# Patient Record
Sex: Male | Born: 1945 | Race: Black or African American | Hispanic: No | Marital: Single | Smoking: Current every day smoker
Health system: Southern US, Community
[De-identification: ages and names within clinical notes are randomized; demographics above are authoritative.]

## PROBLEM LIST (undated history)

## (undated) DIAGNOSIS — N184 Chronic kidney disease, stage 4 (severe): Secondary | ICD-10-CM

## (undated) DIAGNOSIS — E875 Hyperkalemia: Secondary | ICD-10-CM

## (undated) DIAGNOSIS — S42213A Unspecified displaced fracture of surgical neck of unspecified humerus, initial encounter for closed fracture: Secondary | ICD-10-CM

## (undated) DIAGNOSIS — I10 Essential (primary) hypertension: Secondary | ICD-10-CM

## (undated) DIAGNOSIS — N400 Enlarged prostate without lower urinary tract symptoms: Secondary | ICD-10-CM

## (undated) DIAGNOSIS — E1122 Type 2 diabetes mellitus with diabetic chronic kidney disease: Secondary | ICD-10-CM

## (undated) DIAGNOSIS — I209 Angina pectoris, unspecified: Secondary | ICD-10-CM

## (undated) DIAGNOSIS — N189 Chronic kidney disease, unspecified: Secondary | ICD-10-CM

## (undated) DIAGNOSIS — D351 Benign neoplasm of parathyroid gland: Secondary | ICD-10-CM

## (undated) DIAGNOSIS — R4182 Altered mental status, unspecified: Secondary | ICD-10-CM

## (undated) DIAGNOSIS — G9340 Encephalopathy, unspecified: Secondary | ICD-10-CM

## (undated) DIAGNOSIS — F0390 Unspecified dementia without behavioral disturbance: Secondary | ICD-10-CM

## (undated) DIAGNOSIS — T7840XA Allergy, unspecified, initial encounter: Secondary | ICD-10-CM

## (undated) DIAGNOSIS — K859 Acute pancreatitis without necrosis or infection, unspecified: Secondary | ICD-10-CM

## (undated) DIAGNOSIS — E210 Primary hyperparathyroidism: Principal | ICD-10-CM

## (undated) DIAGNOSIS — Z87442 Personal history of urinary calculi: Secondary | ICD-10-CM

## (undated) DIAGNOSIS — E119 Type 2 diabetes mellitus without complications: Secondary | ICD-10-CM

## (undated) DIAGNOSIS — F209 Schizophrenia, unspecified: Secondary | ICD-10-CM

## (undated) DIAGNOSIS — F1020 Alcohol dependence, uncomplicated: Secondary | ICD-10-CM

## (undated) DIAGNOSIS — Z720 Tobacco use: Secondary | ICD-10-CM

## (undated) HISTORY — DX: Benign neoplasm of parathyroid gland: D35.1

## (undated) HISTORY — PX: KIDNEY STONE SURGERY: SHX686

## (undated) HISTORY — DX: Allergy, unspecified, initial encounter: T78.40XA

## (undated) HISTORY — DX: Unspecified dementia, unspecified severity, without behavioral disturbance, psychotic disturbance, mood disturbance, and anxiety: F03.90

---

## 2000-08-15 ENCOUNTER — Ambulatory Visit (HOSPITAL_COMMUNITY): Admission: RE | Admit: 2000-08-15 | Discharge: 2000-08-15 | Payer: Self-pay | Admitting: Family Medicine

## 2000-08-15 ENCOUNTER — Encounter: Payer: Self-pay | Admitting: Family Medicine

## 2005-12-26 ENCOUNTER — Ambulatory Visit: Payer: Self-pay | Admitting: Family Medicine

## 2006-01-09 ENCOUNTER — Ambulatory Visit: Payer: Self-pay | Admitting: Family Medicine

## 2006-01-12 ENCOUNTER — Ambulatory Visit (HOSPITAL_COMMUNITY): Admission: RE | Admit: 2006-01-12 | Discharge: 2006-01-12 | Payer: Self-pay | Admitting: Internal Medicine

## 2006-01-24 ENCOUNTER — Ambulatory Visit: Payer: Self-pay | Admitting: Family Medicine

## 2006-01-27 ENCOUNTER — Ambulatory Visit (HOSPITAL_COMMUNITY): Admission: RE | Admit: 2006-01-27 | Discharge: 2006-01-27 | Payer: Self-pay | Admitting: Internal Medicine

## 2006-02-01 ENCOUNTER — Ambulatory Visit: Payer: Self-pay | Admitting: Family Medicine

## 2006-11-28 DIAGNOSIS — Z87442 Personal history of urinary calculi: Secondary | ICD-10-CM

## 2006-11-28 DIAGNOSIS — S42213A Unspecified displaced fracture of surgical neck of unspecified humerus, initial encounter for closed fracture: Secondary | ICD-10-CM

## 2006-11-28 HISTORY — DX: Unspecified displaced fracture of surgical neck of unspecified humerus, initial encounter for closed fracture: S42.213A

## 2006-11-28 HISTORY — DX: Personal history of urinary calculi: Z87.442

## 2007-04-09 ENCOUNTER — Emergency Department (HOSPITAL_COMMUNITY): Admission: EM | Admit: 2007-04-09 | Discharge: 2007-04-09 | Payer: Self-pay | Admitting: Emergency Medicine

## 2007-08-10 ENCOUNTER — Ambulatory Visit: Payer: Self-pay | Admitting: *Deleted

## 2007-08-10 ENCOUNTER — Inpatient Hospital Stay (HOSPITAL_COMMUNITY): Admission: EM | Admit: 2007-08-10 | Discharge: 2007-08-22 | Payer: Self-pay | Admitting: Emergency Medicine

## 2007-08-15 HISTORY — PX: OTHER SURGICAL HISTORY: SHX169

## 2007-08-16 ENCOUNTER — Encounter (INDEPENDENT_AMBULATORY_CARE_PROVIDER_SITE_OTHER): Payer: Self-pay | Admitting: Urology

## 2007-08-16 ENCOUNTER — Encounter: Payer: Self-pay | Admitting: Urology

## 2007-08-16 HISTORY — PX: OTHER SURGICAL HISTORY: SHX169

## 2007-08-29 ENCOUNTER — Ambulatory Visit: Payer: Self-pay | Admitting: Internal Medicine

## 2007-08-29 LAB — CONVERTED CEMR LAB
Albumin: 3.5 g/dL (ref 3.5–5.2)
Alkaline Phosphatase: 89 units/L (ref 39–117)
BUN: 13 mg/dL (ref 6–23)
Calcium: 11.4 mg/dL — ABNORMAL HIGH (ref 8.4–10.5)
Chloride: 110 meq/L (ref 96–112)
Glucose, Bld: 98 mg/dL (ref 70–99)
Magnesium: 1.6 mg/dL (ref 1.5–2.5)
Potassium: 4.4 meq/L (ref 3.5–5.3)
Sodium: 142 meq/L (ref 135–145)
Total Protein: 7.6 g/dL (ref 6.0–8.3)

## 2007-09-18 ENCOUNTER — Observation Stay (HOSPITAL_COMMUNITY): Admission: RE | Admit: 2007-09-18 | Discharge: 2007-09-19 | Payer: Self-pay | Admitting: Urology

## 2007-09-18 HISTORY — PX: OTHER SURGICAL HISTORY: SHX169

## 2007-10-24 ENCOUNTER — Ambulatory Visit: Payer: Self-pay | Admitting: Internal Medicine

## 2007-10-24 LAB — CONVERTED CEMR LAB
ALT: 8 units/L (ref 0–53)
Alkaline Phosphatase: 108 units/L (ref 39–117)
CO2: 22 meq/L (ref 19–32)
Creatinine, Ser: 1.46 mg/dL (ref 0.40–1.50)
Glucose, Bld: 71 mg/dL (ref 70–99)
Magnesium: 1.9 mg/dL (ref 1.5–2.5)
Phosphorus: 2.7 mg/dL (ref 2.3–4.6)
Sodium: 137 meq/L (ref 135–145)
Total Bilirubin: 0.3 mg/dL (ref 0.3–1.2)
Total Protein: 8.3 g/dL (ref 6.0–8.3)

## 2008-08-11 ENCOUNTER — Inpatient Hospital Stay (HOSPITAL_COMMUNITY): Admission: EM | Admit: 2008-08-11 | Discharge: 2008-08-25 | Payer: Self-pay | Admitting: Emergency Medicine

## 2008-08-11 ENCOUNTER — Ambulatory Visit: Payer: Self-pay | Admitting: Family Medicine

## 2009-07-08 IMAGING — CT CT ABDOMEN WO/W CM
4 of 8 series · 12 of 32 positions shown, 16 images · non-contrast
Comparison: none

HISTORY: Abdominal pain, chest pain, hypercalcemia, nausea, vomiting,
pancreatitis history multiple stones

[Series 2: use smart ma with max @ 160 · axial · 0.70mm/px · z∈[-647,-177]mm · 3 of 95 slices shown, 7 images]
[im 1/95  soft-tissue]
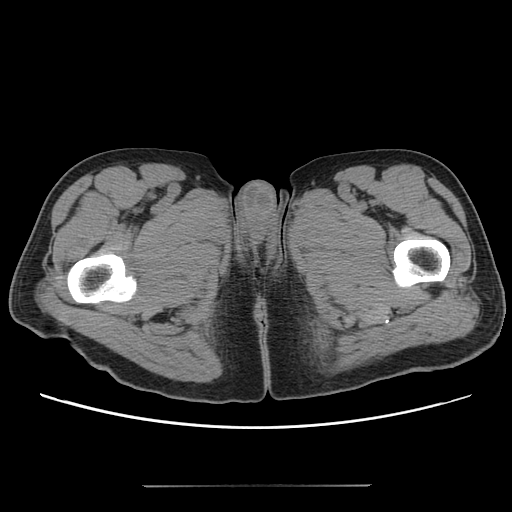
[im 1/95  lung]
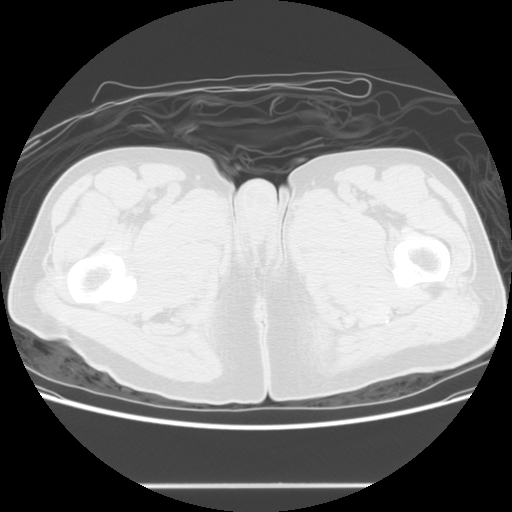
[im 1/95  bone]
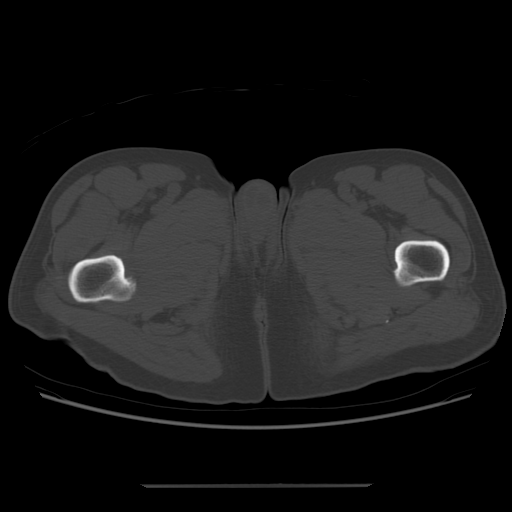
[im 48/95  soft-tissue]
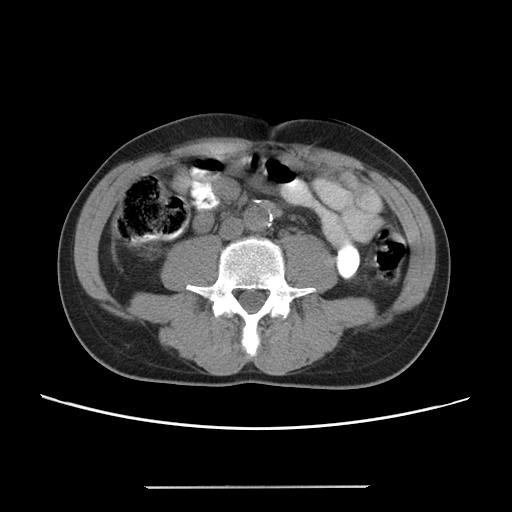
[im 48/95  lung]
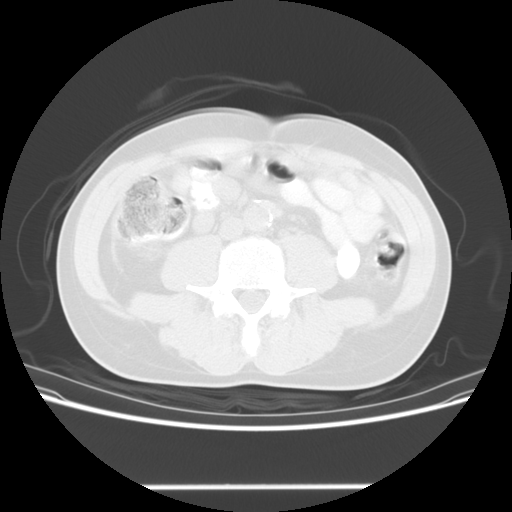
[im 95/95  soft-tissue]
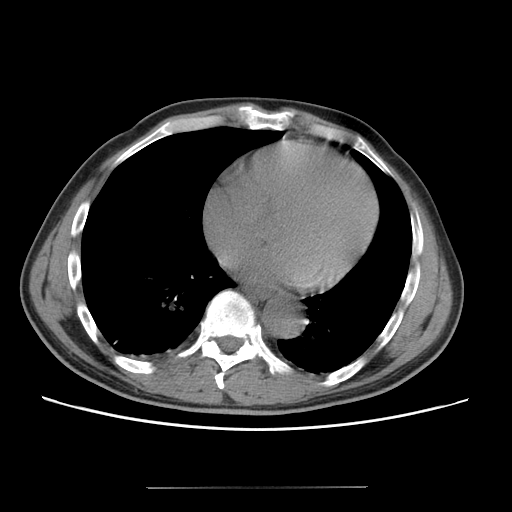
[im 95/95  lung]
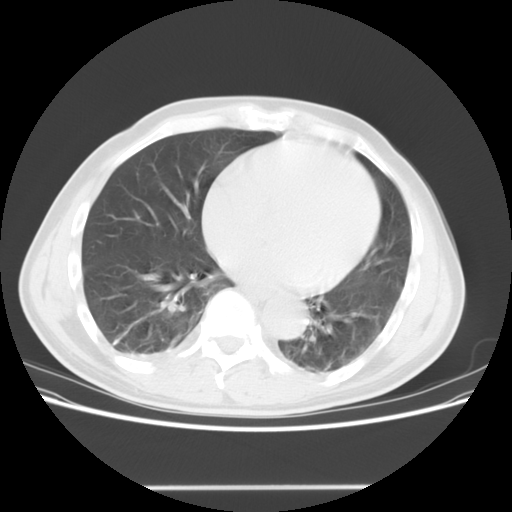

[Series 701: reformatted · sagittal · 0.70mm/px · 4 of 147 slices shown (1 of 3)]
[im 30/147  soft-tissue]
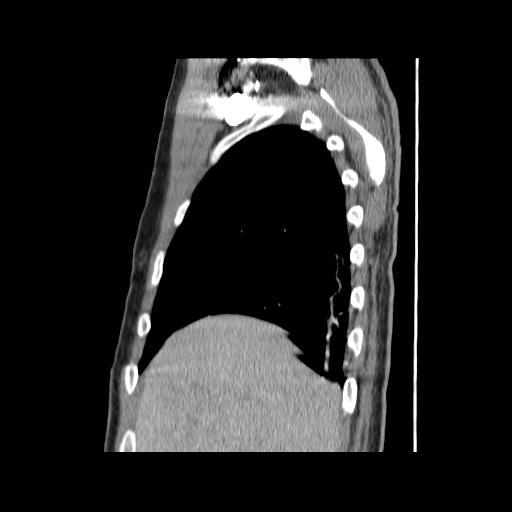
[im 59/147  soft-tissue]
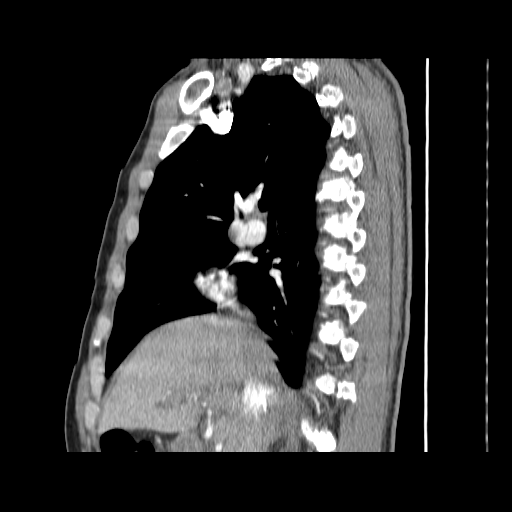
[im 88/147  soft-tissue]
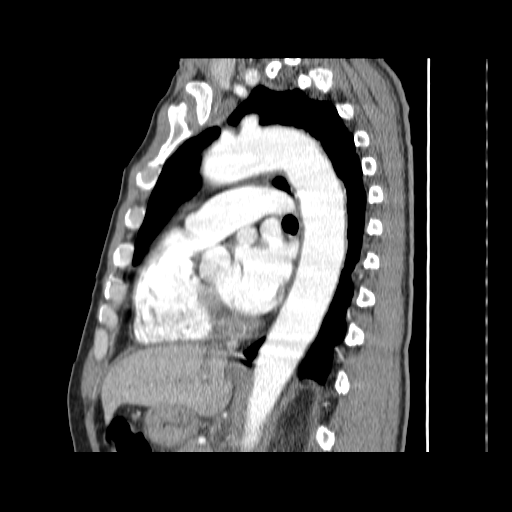
[im 117/147  soft-tissue]
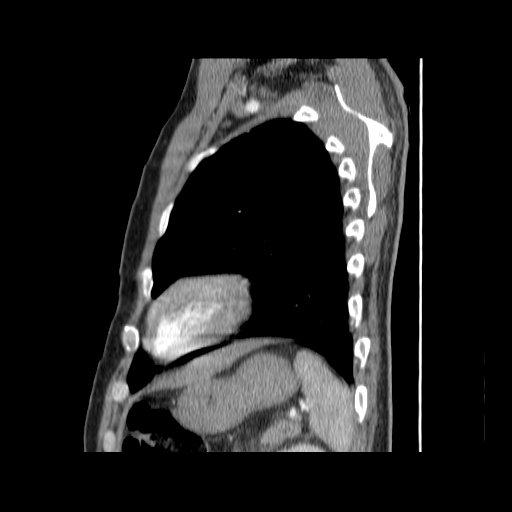

[Series 703: reformatted · sagittal · 0.94mm/px · 3 of 137 slices shown (2 of 3)]
[im 35/137  soft-tissue]
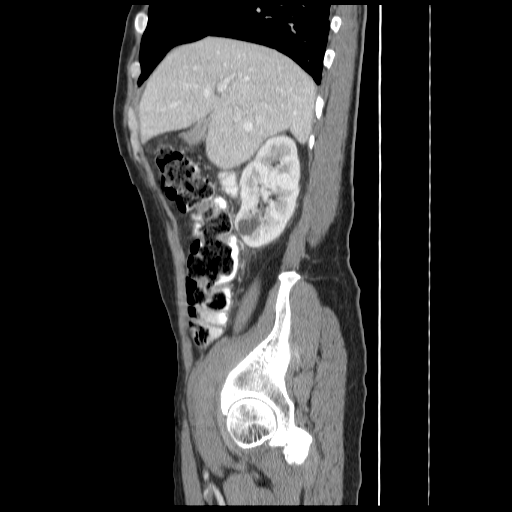
[im 69/137  soft-tissue]
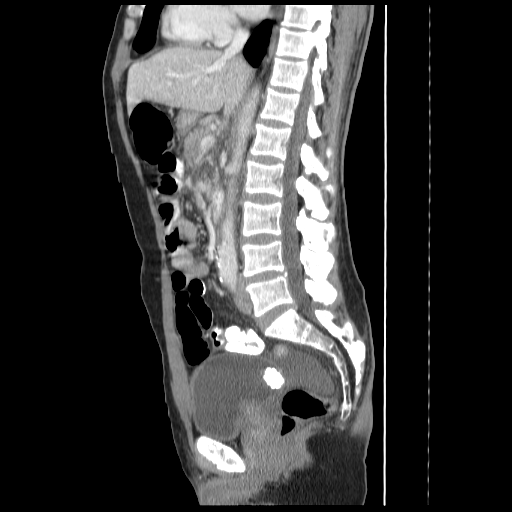
[im 103/137  soft-tissue]
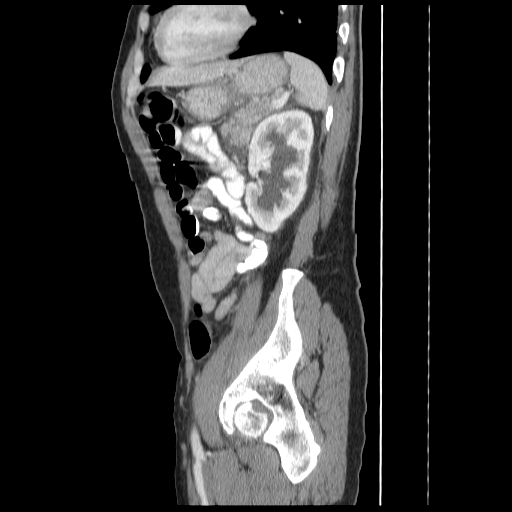

[Series 704: reformatted · coronal · 0.94mm/px · 2 of 111 slices shown (3 of 3)]
[im 37/111  soft-tissue]
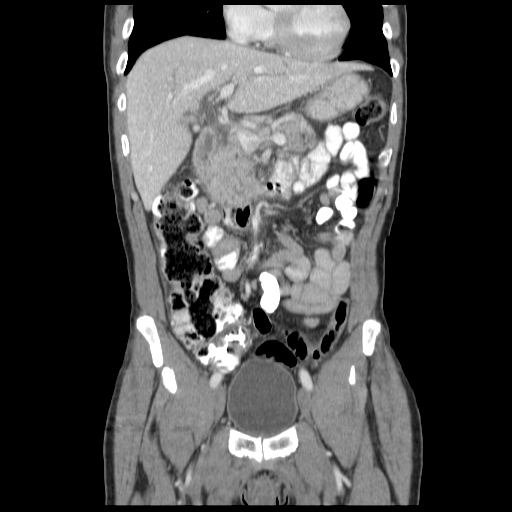
[im 74/111  soft-tissue]
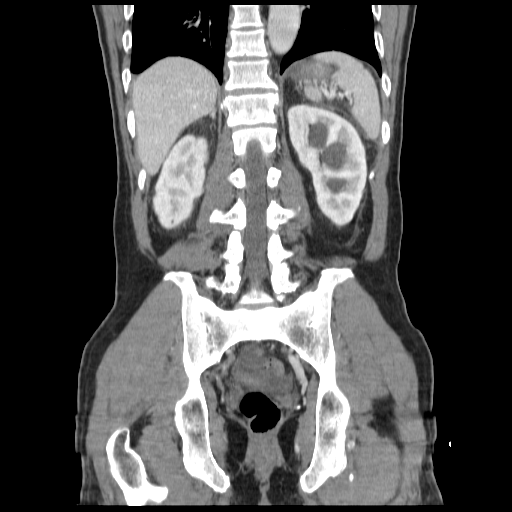

[12 of 32 positions shown; findings below may reference images not displayed]

CT CHEST WITH CONTRAST:

Multidetector helical CT imaging chest performed following 100 cc 9mnipaque-5OO.
Sagittal and coronal images reconstructed from axial data set.

Thoracic vascular structures patent.
Subsegmental atelectasis bilateral lower lobes.
No thoracic adenopathy.
Lungs otherwise clear.
No evidence of pulmonary mass or pleural effusion.
No focal bone lesions.
IMPRESSION: Subsegmental atelectasis bilateral lower lobes.
Otherwise negative CT chest:

CT ABDOMEN AND PELVIS WITH AND WITHOUT CONTRAST:

Multidetector helical CT imaging abdomen and pelvis performed.
Exam utilized dilute oral contrast and 100 cc 9mnipaque-5OO.
Examination includes precontrast, postcontrast, and delayed images.
No prior exam for comparison. 

CT ABDOMEN:

Bilateral nonobstructive calculi lower poles of kidneys.
Left hydronephrosis and hydroureter terminating at a 9 x 6 x 11 mm diameter
proximal left ureteral calculus.
Cholelithiasis with minimally prominent intrahepatic biliary radicles.
The extrahepatic biliary dilatation.
Pancreas unremarkable.
Small simple cyst lower pole right kidney 1.7 cm diameter.
No other focal mass lesion liver, pancreas, spleen, kidneys, or adrenal glands.
Upper abdominal bowel loops unremarkable.
No upper abdominal mass, adenopathy, or ascites.
IMPRESSION: Left hydronephrosis secondary to large proximal left ureteral calculus.
Bilateral renal calculi with small cyst at lower lower pole right kidney.
Cholelithiasis without definite CT features of pancreatitis.

CT PELVIS:

Distal ureters decompressed.
Large bladder calculus, 2.1 x 1.4 x 2.0 cm.
Prostatic enlargement indenting bladder base, gland measuring 5.2 x 4.1 x
cm.
Moderate amount of nonspecific low attenuation free pelvic fluid, uncertain
etiology.
Large and small bowel loops in pelvis normal.
Normal appendix.
No pelvic mass, adenopathy, or hernia.
IMPRESSION: Large bladder calculus.
Pelvic ascites of uncertain etiology.
Prostatic enlargement.

## 2009-11-28 DIAGNOSIS — K859 Acute pancreatitis without necrosis or infection, unspecified: Secondary | ICD-10-CM

## 2009-11-28 DIAGNOSIS — R4182 Altered mental status, unspecified: Secondary | ICD-10-CM

## 2009-11-28 HISTORY — DX: Altered mental status, unspecified: R41.82

## 2009-11-28 HISTORY — DX: Acute pancreatitis without necrosis or infection, unspecified: K85.90

## 2010-08-11 ENCOUNTER — Ambulatory Visit: Payer: Self-pay | Admitting: Infectious Diseases

## 2010-08-15 ENCOUNTER — Inpatient Hospital Stay (HOSPITAL_COMMUNITY): Admission: EM | Admit: 2010-08-15 | Discharge: 2010-09-10 | Payer: Self-pay | Admitting: Emergency Medicine

## 2010-08-15 ENCOUNTER — Ambulatory Visit: Payer: Self-pay | Admitting: Internal Medicine

## 2010-09-03 ENCOUNTER — Encounter: Payer: Self-pay | Admitting: Internal Medicine

## 2010-09-04 HISTORY — PX: OTHER SURGICAL HISTORY: SHX169

## 2010-09-07 ENCOUNTER — Encounter: Payer: Self-pay | Admitting: Internal Medicine

## 2010-10-04 ENCOUNTER — Ambulatory Visit (HOSPITAL_COMMUNITY): Admission: RE | Admit: 2010-10-04 | Discharge: 2010-10-04 | Payer: Self-pay | Admitting: Internal Medicine

## 2010-10-12 ENCOUNTER — Ambulatory Visit: Payer: Self-pay | Admitting: Psychiatry

## 2010-12-19 ENCOUNTER — Encounter: Payer: Self-pay | Admitting: Neurology

## 2011-02-10 LAB — BASIC METABOLIC PANEL
BUN: 13 mg/dL (ref 6–23)
BUN: 15 mg/dL (ref 6–23)
BUN: 16 mg/dL (ref 6–23)
BUN: 17 mg/dL (ref 6–23)
BUN: 17 mg/dL (ref 6–23)
BUN: 20 mg/dL (ref 6–23)
BUN: 21 mg/dL (ref 6–23)
BUN: 22 mg/dL (ref 6–23)
BUN: 25 mg/dL — ABNORMAL HIGH (ref 6–23)
BUN: 25 mg/dL — ABNORMAL HIGH (ref 6–23)
BUN: 9 mg/dL (ref 6–23)
CO2: 15 mEq/L — ABNORMAL LOW (ref 19–32)
CO2: 22 mEq/L (ref 19–32)
CO2: 22 mEq/L (ref 19–32)
CO2: 23 mEq/L (ref 19–32)
CO2: 23 mEq/L (ref 19–32)
CO2: 24 mEq/L (ref 19–32)
CO2: 25 mEq/L (ref 19–32)
CO2: 26 mEq/L (ref 19–32)
CO2: 26 mEq/L (ref 19–32)
CO2: 26 mEq/L (ref 19–32)
CO2: 26 mEq/L (ref 19–32)
CO2: 27 mEq/L (ref 19–32)
CO2: 28 mEq/L (ref 19–32)
Calcium: 10.5 mg/dL (ref 8.4–10.5)
Calcium: 10.5 mg/dL (ref 8.4–10.5)
Calcium: 10.5 mg/dL (ref 8.4–10.5)
Calcium: 10.8 mg/dL — ABNORMAL HIGH (ref 8.4–10.5)
Calcium: 11.3 mg/dL — ABNORMAL HIGH (ref 8.4–10.5)
Calcium: 11.3 mg/dL — ABNORMAL HIGH (ref 8.4–10.5)
Calcium: 9.7 mg/dL (ref 8.4–10.5)
Chloride: 108 mEq/L (ref 96–112)
Chloride: 108 mEq/L (ref 96–112)
Chloride: 109 mEq/L (ref 96–112)
Chloride: 109 mEq/L (ref 96–112)
Chloride: 110 mEq/L (ref 96–112)
Chloride: 111 mEq/L (ref 96–112)
Chloride: 111 mEq/L (ref 96–112)
Chloride: 111 mEq/L (ref 96–112)
Chloride: 112 mEq/L (ref 96–112)
Chloride: 112 mEq/L (ref 96–112)
Chloride: 113 mEq/L — ABNORMAL HIGH (ref 96–112)
Chloride: 113 mEq/L — ABNORMAL HIGH (ref 96–112)
Chloride: 114 mEq/L — ABNORMAL HIGH (ref 96–112)
Chloride: 115 mEq/L — ABNORMAL HIGH (ref 96–112)
Chloride: 117 mEq/L — ABNORMAL HIGH (ref 96–112)
Creatinine, Ser: 1.46 mg/dL (ref 0.4–1.5)
Creatinine, Ser: 1.58 mg/dL — ABNORMAL HIGH (ref 0.4–1.5)
Creatinine, Ser: 1.59 mg/dL — ABNORMAL HIGH (ref 0.4–1.5)
Creatinine, Ser: 1.73 mg/dL — ABNORMAL HIGH (ref 0.4–1.5)
Creatinine, Ser: 1.97 mg/dL — ABNORMAL HIGH (ref 0.4–1.5)
Creatinine, Ser: 1.97 mg/dL — ABNORMAL HIGH (ref 0.4–1.5)
Creatinine, Ser: 1.98 mg/dL — ABNORMAL HIGH (ref 0.4–1.5)
Creatinine, Ser: 2.02 mg/dL — ABNORMAL HIGH (ref 0.4–1.5)
Creatinine, Ser: 2.03 mg/dL — ABNORMAL HIGH (ref 0.4–1.5)
Creatinine, Ser: 2.08 mg/dL — ABNORMAL HIGH (ref 0.4–1.5)
Creatinine, Ser: 2.2 mg/dL — ABNORMAL HIGH (ref 0.4–1.5)
GFR calc Af Amer: 34 mL/min — ABNORMAL LOW (ref >60)
GFR calc Af Amer: 39 mL/min — ABNORMAL LOW (ref >60)
GFR calc Af Amer: 40 mL/min — ABNORMAL LOW (ref >60)
GFR calc Af Amer: 41 mL/min — ABNORMAL LOW (ref >60)
GFR calc Af Amer: 42 mL/min — ABNORMAL LOW (ref >60)
GFR calc Af Amer: 48 mL/min — ABNORMAL LOW (ref >60)
GFR calc Af Amer: 49 mL/min — ABNORMAL LOW (ref >60)
GFR calc Af Amer: 49 mL/min — ABNORMAL LOW (ref >60)
GFR calc Af Amer: 54 mL/min — ABNORMAL LOW (ref >60)
GFR calc non Af Amer: 30 mL/min — ABNORMAL LOW (ref >60)
GFR calc non Af Amer: 34 mL/min — ABNORMAL LOW (ref >60)
GFR calc non Af Amer: 40 mL/min — ABNORMAL LOW (ref >60)
GFR calc non Af Amer: 41 mL/min — ABNORMAL LOW (ref >60)
GFR calc non Af Amer: 44 mL/min — ABNORMAL LOW (ref >60)
Glucose, Bld: 104 mg/dL — ABNORMAL HIGH (ref 70–99)
Glucose, Bld: 106 mg/dL — ABNORMAL HIGH (ref 70–99)
Glucose, Bld: 108 mg/dL — ABNORMAL HIGH (ref 70–99)
Glucose, Bld: 111 mg/dL — ABNORMAL HIGH (ref 70–99)
Glucose, Bld: 119 mg/dL — ABNORMAL HIGH (ref 70–99)
Glucose, Bld: 122 mg/dL — ABNORMAL HIGH (ref 70–99)
Glucose, Bld: 123 mg/dL — ABNORMAL HIGH (ref 70–99)
Glucose, Bld: 128 mg/dL — ABNORMAL HIGH (ref 70–99)
Glucose, Bld: 89 mg/dL (ref 70–99)
Glucose, Bld: 91 mg/dL (ref 70–99)
Glucose, Bld: 96 mg/dL (ref 70–99)
Potassium: 3.1 mEq/L — ABNORMAL LOW (ref 3.5–5.1)
Potassium: 3.7 mEq/L (ref 3.5–5.1)
Potassium: 3.8 mEq/L (ref 3.5–5.1)
Potassium: 3.8 mEq/L (ref 3.5–5.1)
Potassium: 3.8 mEq/L (ref 3.5–5.1)
Potassium: 3.9 mEq/L (ref 3.5–5.1)
Potassium: 4.1 mEq/L (ref 3.5–5.1)
Potassium: 4.5 mEq/L (ref 3.5–5.1)
Potassium: 4.5 mEq/L (ref 3.5–5.1)
Potassium: 4.7 mEq/L (ref 3.5–5.1)
Potassium: 5.1 mEq/L (ref 3.5–5.1)
Sodium: 139 mEq/L (ref 135–145)
Sodium: 139 mEq/L (ref 135–145)
Sodium: 139 mEq/L (ref 135–145)
Sodium: 139 mEq/L (ref 135–145)
Sodium: 139 mEq/L (ref 135–145)
Sodium: 140 mEq/L (ref 135–145)
Sodium: 140 mEq/L (ref 135–145)
Sodium: 141 mEq/L (ref 135–145)
Sodium: 142 mEq/L (ref 135–145)

## 2011-02-10 LAB — GLUCOSE, CAPILLARY
Glucose-Capillary: 108 mg/dL — ABNORMAL HIGH (ref 70–99)
Glucose-Capillary: 117 mg/dL — ABNORMAL HIGH (ref 70–99)
Glucose-Capillary: 92 mg/dL (ref 70–99)
Glucose-Capillary: 93 mg/dL (ref 70–99)
Glucose-Capillary: 99 mg/dL (ref 70–99)

## 2011-02-10 LAB — COMPREHENSIVE METABOLIC PANEL
ALT: 13 U/L (ref 0–53)
ALT: 14 U/L (ref 0–53)
ALT: 21 U/L (ref 0–53)
ALT: 29 U/L (ref 0–53)
ALT: 42 U/L (ref 0–53)
AST: 23 U/L (ref 0–37)
AST: 23 U/L (ref 0–37)
AST: 23 U/L (ref 0–37)
AST: 26 U/L (ref 0–37)
AST: 64 U/L — ABNORMAL HIGH (ref 0–37)
Albumin: 2.4 g/dL — ABNORMAL LOW (ref 3.5–5.2)
Albumin: 2.4 g/dL — ABNORMAL LOW (ref 3.5–5.2)
Albumin: 2.6 g/dL — ABNORMAL LOW (ref 3.5–5.2)
Albumin: 3.6 g/dL (ref 3.5–5.2)
Alkaline Phosphatase: 120 U/L — ABNORMAL HIGH (ref 39–117)
Alkaline Phosphatase: 69 U/L (ref 39–117)
Alkaline Phosphatase: 76 U/L (ref 39–117)
Alkaline Phosphatase: 78 U/L (ref 39–117)
BUN: 16 mg/dL (ref 6–23)
BUN: 22 mg/dL (ref 6–23)
BUN: 23 mg/dL (ref 6–23)
BUN: 24 mg/dL — ABNORMAL HIGH (ref 6–23)
CO2: 21 mEq/L (ref 19–32)
CO2: 25 mEq/L (ref 19–32)
Calcium: 13.8 mg/dL (ref 8.4–10.5)
Calcium: 14.6 mg/dL (ref 8.4–10.5)
Calcium: 14.7 mg/dL (ref 8.4–10.5)
Chloride: 106 mEq/L (ref 96–112)
Chloride: 109 mEq/L (ref 96–112)
Chloride: 113 mEq/L — ABNORMAL HIGH (ref 96–112)
Chloride: 115 mEq/L — ABNORMAL HIGH (ref 96–112)
Creatinine, Ser: 1.68 mg/dL — ABNORMAL HIGH (ref 0.4–1.5)
Creatinine, Ser: 1.73 mg/dL — ABNORMAL HIGH (ref 0.4–1.5)
Creatinine, Ser: 1.91 mg/dL — ABNORMAL HIGH (ref 0.4–1.5)
Creatinine, Ser: 2.13 mg/dL — ABNORMAL HIGH (ref 0.4–1.5)
GFR calc Af Amer: 38 mL/min — ABNORMAL LOW (ref >60)
GFR calc Af Amer: 39 mL/min — ABNORMAL LOW (ref >60)
GFR calc Af Amer: 43 mL/min — ABNORMAL LOW (ref >60)
GFR calc Af Amer: 50 mL/min — ABNORMAL LOW (ref >60)
GFR calc Af Amer: 51 mL/min — ABNORMAL LOW (ref >60)
GFR calc non Af Amer: 39 mL/min — ABNORMAL LOW (ref >60)
GFR calc non Af Amer: 40 mL/min — ABNORMAL LOW (ref >60)
Glucose, Bld: 115 mg/dL — ABNORMAL HIGH (ref 70–99)
Glucose, Bld: 117 mg/dL — ABNORMAL HIGH (ref 70–99)
Potassium: 3.2 mEq/L — ABNORMAL LOW (ref 3.5–5.1)
Potassium: 3.3 mEq/L — ABNORMAL LOW (ref 3.5–5.1)
Potassium: 3.4 mEq/L — ABNORMAL LOW (ref 3.5–5.1)
Potassium: 3.6 mEq/L (ref 3.5–5.1)
Sodium: 136 mEq/L (ref 135–145)
Sodium: 139 mEq/L (ref 135–145)
Sodium: 139 mEq/L (ref 135–145)
Sodium: 140 mEq/L (ref 135–145)
Sodium: 144 mEq/L (ref 135–145)
Total Bilirubin: 0.6 mg/dL (ref 0.3–1.2)
Total Bilirubin: 0.7 mg/dL (ref 0.3–1.2)
Total Bilirubin: 0.8 mg/dL (ref 0.3–1.2)
Total Bilirubin: 0.8 mg/dL (ref 0.3–1.2)
Total Protein: 6.1 g/dL (ref 6.0–8.3)
Total Protein: 6.2 g/dL (ref 6.0–8.3)
Total Protein: 6.6 g/dL (ref 6.0–8.3)
Total Protein: 7.7 g/dL (ref 6.0–8.3)

## 2011-02-10 LAB — CBC
HCT: 31.1 % — ABNORMAL LOW (ref 39.0–52.0)
HCT: 32 % — ABNORMAL LOW (ref 39.0–52.0)
HCT: 32.1 % — ABNORMAL LOW (ref 39.0–52.0)
HCT: 32.5 % — ABNORMAL LOW (ref 39.0–52.0)
HCT: 33.5 % — ABNORMAL LOW (ref 39.0–52.0)
HCT: 35.4 % — ABNORMAL LOW (ref 39.0–52.0)
HCT: 35.5 % — ABNORMAL LOW (ref 39.0–52.0)
HCT: 39.3 % (ref 39.0–52.0)
HCT: 44.7 % (ref 39.0–52.0)
Hemoglobin: 10 g/dL — ABNORMAL LOW (ref 13.0–17.0)
Hemoglobin: 10.2 g/dL — ABNORMAL LOW (ref 13.0–17.0)
Hemoglobin: 10.3 g/dL — ABNORMAL LOW (ref 13.0–17.0)
Hemoglobin: 10.4 g/dL — ABNORMAL LOW (ref 13.0–17.0)
Hemoglobin: 10.6 g/dL — ABNORMAL LOW (ref 13.0–17.0)
Hemoglobin: 10.7 g/dL — ABNORMAL LOW (ref 13.0–17.0)
Hemoglobin: 11.9 g/dL — ABNORMAL LOW (ref 13.0–17.0)
Hemoglobin: 12 g/dL — ABNORMAL LOW (ref 13.0–17.0)
Hemoglobin: 12.4 g/dL — ABNORMAL LOW (ref 13.0–17.0)
Hemoglobin: 12.4 g/dL — ABNORMAL LOW (ref 13.0–17.0)
Hemoglobin: 13.8 g/dL (ref 13.0–17.0)
Hemoglobin: 15.1 g/dL (ref 13.0–17.0)
MCH: 27 pg (ref 26.0–34.0)
MCH: 27.4 pg (ref 26.0–34.0)
MCH: 27.5 pg (ref 26.0–34.0)
MCH: 27.5 pg (ref 26.0–34.0)
MCH: 27.8 pg (ref 26.0–34.0)
MCH: 27.9 pg (ref 26.0–34.0)
MCH: 28 pg (ref 26.0–34.0)
MCH: 28 pg (ref 26.0–34.0)
MCH: 28.1 pg (ref 26.0–34.0)
MCH: 28.1 pg (ref 26.0–34.0)
MCHC: 31.3 g/dL (ref 30.0–36.0)
MCHC: 31.4 g/dL (ref 30.0–36.0)
MCHC: 31.9 g/dL (ref 30.0–36.0)
MCHC: 33.1 g/dL (ref 30.0–36.0)
MCHC: 33.3 g/dL (ref 30.0–36.0)
MCHC: 33.4 g/dL (ref 30.0–36.0)
MCHC: 33.6 g/dL (ref 30.0–36.0)
MCHC: 33.6 g/dL (ref 30.0–36.0)
MCHC: 33.8 g/dL (ref 30.0–36.0)
MCV: 82 fL (ref 78.0–100.0)
MCV: 82.7 fL (ref 78.0–100.0)
MCV: 83.2 fL (ref 78.0–100.0)
MCV: 83.5 fL (ref 78.0–100.0)
MCV: 83.6 fL (ref 78.0–100.0)
MCV: 83.7 fL (ref 78.0–100.0)
MCV: 84.1 fL (ref 78.0–100.0)
MCV: 84.5 fL (ref 78.0–100.0)
MCV: 85.9 fL (ref 78.0–100.0)
Platelets: 270 10*3/uL (ref 150–400)
Platelets: 301 10*3/uL (ref 150–400)
Platelets: 321 10*3/uL (ref 150–400)
Platelets: 340 10*3/uL (ref 150–400)
Platelets: 373 10*3/uL (ref 150–400)
Platelets: 383 10*3/uL (ref 150–400)
Platelets: 415 10*3/uL — ABNORMAL HIGH (ref 150–400)
Platelets: 442 10*3/uL — ABNORMAL HIGH (ref 150–400)
Platelets: 447 10*3/uL — ABNORMAL HIGH (ref 150–400)
Platelets: 481 10*3/uL — ABNORMAL HIGH (ref 150–400)
Platelets: 516 10*3/uL — ABNORMAL HIGH (ref 150–400)
RBC: 3.67 MIL/uL — ABNORMAL LOW (ref 4.22–5.81)
RBC: 3.7 MIL/uL — ABNORMAL LOW (ref 4.22–5.81)
RBC: 3.9 MIL/uL — ABNORMAL LOW (ref 4.22–5.81)
RBC: 4.11 MIL/uL — ABNORMAL LOW (ref 4.22–5.81)
RBC: 4.38 MIL/uL (ref 4.22–5.81)
RBC: 4.5 MIL/uL (ref 4.22–5.81)
RBC: 4.94 MIL/uL (ref 4.22–5.81)
RBC: 5.09 MIL/uL (ref 4.22–5.81)
RBC: 5.34 MIL/uL (ref 4.22–5.81)
RDW: 15.3 % (ref 11.5–15.5)
RDW: 15.4 % (ref 11.5–15.5)
RDW: 15.5 % (ref 11.5–15.5)
RDW: 15.5 % (ref 11.5–15.5)
RDW: 15.6 % — ABNORMAL HIGH (ref 11.5–15.5)
RDW: 15.7 % — ABNORMAL HIGH (ref 11.5–15.5)
RDW: 15.9 % — ABNORMAL HIGH (ref 11.5–15.5)
RDW: 16 % — ABNORMAL HIGH (ref 11.5–15.5)
RDW: 16 % — ABNORMAL HIGH (ref 11.5–15.5)
WBC: 10.2 10*3/uL (ref 4.0–10.5)
WBC: 10.7 10*3/uL — ABNORMAL HIGH (ref 4.0–10.5)
WBC: 10.8 10*3/uL — ABNORMAL HIGH (ref 4.0–10.5)
WBC: 11.1 10*3/uL — ABNORMAL HIGH (ref 4.0–10.5)
WBC: 11.5 10*3/uL — ABNORMAL HIGH (ref 4.0–10.5)
WBC: 11.7 10*3/uL — ABNORMAL HIGH (ref 4.0–10.5)
WBC: 12.6 10*3/uL — ABNORMAL HIGH (ref 4.0–10.5)
WBC: 14.1 10*3/uL — ABNORMAL HIGH (ref 4.0–10.5)
WBC: 7.7 10*3/uL (ref 4.0–10.5)
WBC: 8.6 10*3/uL (ref 4.0–10.5)
WBC: 9.3 10*3/uL (ref 4.0–10.5)

## 2011-02-10 LAB — RAPID URINE DRUG SCREEN, HOSP PERFORMED
Amphetamines: NOT DETECTED
Benzodiazepines: NOT DETECTED
Cocaine: NOT DETECTED

## 2011-02-10 LAB — URINALYSIS, ROUTINE W REFLEX MICROSCOPIC
Bilirubin Urine: NEGATIVE
Glucose, UA: NEGATIVE mg/dL
Ketones, ur: NEGATIVE mg/dL
Specific Gravity, Urine: 1.012 (ref 1.005–1.030)
Urobilinogen, UA: 0.2 mg/dL (ref 0.0–1.0)
pH: 6.5 (ref 5.0–8.0)

## 2011-02-10 LAB — LIPID PANEL
LDL Cholesterol: 117 mg/dL — ABNORMAL HIGH (ref 0–99)
VLDL: 28 mg/dL (ref 0–40)

## 2011-02-10 LAB — DIFFERENTIAL
Basophils Relative: 0 % (ref 0–1)
Eosinophils Relative: 0 % (ref 0–5)
Eosinophils Relative: 1 % (ref 0–5)
Lymphocytes Relative: 10 % — ABNORMAL LOW (ref 12–46)
Monocytes Absolute: 0.7 10*3/uL (ref 0.1–1.0)
Monocytes Absolute: 1.2 10*3/uL — ABNORMAL HIGH (ref 0.1–1.0)
Monocytes Relative: 14 % — ABNORMAL HIGH (ref 3–12)
Monocytes Relative: 6 % (ref 3–12)
Neutro Abs: 6.4 10*3/uL (ref 1.7–7.7)
Neutro Abs: 9.7 10*3/uL — ABNORMAL HIGH (ref 1.7–7.7)

## 2011-02-10 LAB — HIV ANTIBODY (ROUTINE TESTING W REFLEX): HIV: NONREACTIVE

## 2011-02-10 LAB — TSH: TSH: 0.843 u[IU]/mL (ref 0.350–4.500)

## 2011-02-10 LAB — CK TOTAL AND CKMB (NOT AT ARMC): CK, MB: 3 ng/mL (ref 0.3–4.0)

## 2011-02-10 LAB — TROPONIN I: Troponin I: 0.01 ng/mL (ref 0.00–0.06)

## 2011-02-10 LAB — AMMONIA
Ammonia: 35 umol/L (ref 11–35)
Ammonia: 43 umol/L — ABNORMAL HIGH (ref 11–35)

## 2011-02-10 LAB — MRSA PCR SCREENING: MRSA by PCR: NEGATIVE

## 2011-02-10 LAB — PTH, INTACT AND CALCIUM: Calcium, Total (PTH): >15 mg/dL (ref 8.4–10.5)

## 2011-02-10 LAB — ETHANOL: Alcohol, Ethyl (B): <5 mg/dL (ref 0–10)

## 2011-02-10 LAB — URINE MICROSCOPIC-ADD ON

## 2011-02-10 LAB — CALCIUM: Calcium: 10.7 mg/dL — ABNORMAL HIGH (ref 8.4–10.5)

## 2011-02-10 LAB — AMYLASE: Amylase: 169 U/L — ABNORMAL HIGH (ref 0–105)

## 2011-02-10 LAB — CALCIUM, IONIZED: Calcium, Ion: 2.23 mmol/L (ref 1.12–1.32)

## 2011-02-10 LAB — HEMOGLOBIN A1C: Hgb A1c MFr Bld: 6.2 % — ABNORMAL HIGH (ref <5.7)

## 2011-02-10 LAB — PROTIME-INR: INR: 0.91 (ref 0.00–1.49)

## 2011-03-16 ENCOUNTER — Encounter: Payer: Self-pay | Admitting: Thoracic Surgery

## 2011-04-12 NOTE — Op Note (Signed)
NAMEZENAIDO, [name]             ACCOUNT NO.:  [id number]   MEDICAL RECORD NO.:  [id number]          PATIENT TYPE:  INP   LOCATION:  43                         FACILITY:  [facility]   PHYSICIAN:  Reece Packer, MD DATE OF BIRTH:  [birth year]   DATE OF PROCEDURE:  08/15/2007  DATE OF DISCHARGE:                               OPERATIVE REPORT   PREOPERATIVE DIAGNOSIS:  Left ureteral stone with left hydronephrosis;  bladder stone.   POSTOPERATIVE DIAGNOSIS:  Left ureteral stone with left hydronephrosis;  bladder stone.   PROCEDURE:  Cystoscopy, transurethral removal of bladder stone, left  retrograde ureterogram, failed left insertion of stent.   SURGEON:  Reece Packer, M.D.   [name] has what appears to be a large impacted stone in the upper  third of his left ureter.  He was not having pain.  He had multiple  stones in both kidneys due to hypercalcemia.  He also has a large  bladder stone.   Initially we cystoscoped the patient with a 21-French scope.  The penile  bulbar membranous and prostatic urethra were normal.  He had grade 1/4  bladdertrabeculation .  He had a 2-cm stone in his bladder.  Utilizing a  holmium laser, we fractured the stone down to the sand.  We irrigated  out all the fragments.  There was little to no bleeding.  The ureteral  trigone was easily identified.  I was very pleased with the procedure  which took approximately 60 minutes.   We then performed a left retrograde ureterogram.  An open-ended ureteral  catheter was used and gently inserted over guidewire into the left  ureter.  I even placed the ureteral catheter just below the easily  identified proximal left ureteral stone.  With gentle pressure, no  contrast could get beyond the stone.  There was the usual filling defect  from the stone.  We tried several sensor wires of different shapes and  sizes and different techniques, even putting the kidney on traction.  I  could not pass a wire  past the stone.  The procedure was therefore  stopped   A Foley catheter was inserted.  I was very happy with removal of the  stone.  The plan is to place a left percutaneous tube.           ______________________________  Reece Packer, MD  Electronically Signed     SAM/MEDQ  D:  09/19/2007  T:  09/20/2007  Job:  [id number]

## 2011-04-12 NOTE — H&P (Signed)
NAMEGORDON, [name] NO.:  [id number]   MEDICAL RECORD NO.:  [id number]          PATIENT TYPE:  INP   LOCATION:  1832                         FACILITY:  [facility]   PHYSICIAN:  Brion Aliment, M.D.  DATE OF BIRTH:  09-Jul-1946   DATE OF ADMISSION:  08/11/2008  DATE OF DISCHARGE:                              HISTORY & PHYSICAL   PRIMARY CARE Laureen Frederic:  Unassigned.   CHIEF COMPLAINT:  Abdominal pain.   HISTORY OF PRESENT ILLNESS:  [name] is a 65 year old male with  past medical history significant for schizophrenia, alcohol dependence,  primary hyperparathyroidism, and renal stones who presents to [facility]  Emergency Department with diffuse abdominal pain.   Of note, [name]'s history is largely provided by his brother who  lives with the patient as he has altered mental status.  Mr. Nodal  developed acute onset abdominal pain on the evening prior to admission,  which would be August 10, 2008.  The pain is diffuse.  However, Mr.  [name] occasionally points to his lower left quadrant and occasionally  to his mid epigastric region when asked to localize the pain.  He does  not know what makes the pain better or worse.  He has complained of  nausea associated with this pain, and has avoided a few foods.  He  denies fevers, chills, sweats, and diarrhea associated with this  illness.   Of note, he states that he drinks three to four 24-ounce cans of beer  per day.  His last drink was two days ago.  He denies any other drug  use.  In the emergency department, [name] was given 500 mL bolus  of normal saline with a normal saline drip of 100 mL per hour.  He was  also given 10 mg of Nubain and Zofran.  [name]'s contact details  are his brother, [name] at [phone].  He is full code.   PAST MEDICAL HISTORY:  Significant for:  1. Schizophrenia.  2. Acute pancreatitis in September 2008.  3. History of multiple kidney stones in September  2008.  4. Hyperparathyroidism with single parathyroid adenoma.  5. Alcohol dependence.  6. Tobacco abuse.   He goes to [facility] for his schizophrenia.  He has no primary  PCP.   PAST SURGICAL HISTORY:  1. Open left proximal ureterolithotomy, October 2008.  2. Two attempts of retrograde ureteral stenting in September 2008.  3. Right humeral neck fracture in 2008.  No evidence of surgery for      this fracture.   MEDICATIONS:  Zyprexa 30 mg nightly.   ALLERGIES:  No known drug allergies.   FAMILY HISTORY:  Not able to obtain as the patient has altered mental  status.   SOCIAL HISTORY:  Lives with brother, [name].  He has no  designated power of attorney, but brother often makes his medical  decisions.  As in history of present illness, he is a heavy alcohol  drinker.  He also smokes 1 pack per day, and has done so for  approximately 30-40 years.  His brother states that the patient has  a  remote history of marijuana and crack use.  [name] was  imprisoned in the 1990s, and we are unable to obtain whether he had a  PPD or not following his prison time.  He is currently in disability and  does not work.   REVIEW OF SYSTEMS:  Obtained via brother, please see HPI.  Otherwise,  ten-item review of systems is negative.   OBJECTIVE/EXAM:  VITAL SIGNS:  Blood pressure systolic ranging from XX123456-  123XX123, diastolic from 123456; pulse 69-85; respiratory rate 20; oxygen  saturation 98% on room air; and temperature is 98.1 Fahrenheit.  GENERAL:  Elderly-appearing man, resting in the emergency department  bed, arousable, but with altered mental status.  HEENT:  Oral mucosa is moist with poor dentition.  EOMI.  PERRL.  No  gross changes noted.  NECK:  No JVD noted.  No bruits or lymphadenopathy noted.  CARDIOVASCULAR:  Regular rate and rhythm with no murmurs, rubs, or  gallops.  Pulses are 2+ in all four extremities.  LUNGS:  Bibasilar expiratory wheezes noted, otherwise  clear with good  air movement.  ABDOMEN:  Nondistended with normoactive bowel sounds.  Soft with no  rebound or guarding.  Tenderness noted to deep palpation diffusely.  However, [name] is able to tolerate the exam well.  EXTREMITIES:  No cyanosis, clubbing, or edema noted.  BACK:  No costovertebral angle tenderness bilaterally.  NEURO:  Alert, but altered.  He is orientated to [facility] in  [place].  When I asked what state he is in, he states [place].  When I asked what county in [place] he is in, he states [place].  When I asked what date today is, he says [name].  However, then he  says Sunday.  Note, today is Monday.  He states that the season is  summer and that the month is summer.  He is unable to tell me the year.   Cranial nerves II through XII are grossly intact.  His extremities are  stiff to motion with some cogwheeling noted bilaterally on arm  extension.  His reflexes are 2+ throughout, and he has 1-2 beats of  clonus on his bilateral lower extremities.  When I asked to extend his  hand and extend his wrist, his hands will fall forward.  However, he  does not have the classical flapping of asterixis.   LABORATORY DATA:  Basic chemistry; sodium 139, potassium 4.1, chloride  108, bicarb 22, BUN 19, creatinine 1.65, glucose 129, calcium 14.1, AST  17, ALT 12, albumin 3.7, total bilirubin 1.0, amylase 2526, and lipase  759.  CBC; white blood cell count 9.9, H&H 16.3/48.0, and platelets 298.  Coags, PT 13.4, INR 1.0, and APTT 30.  Alcohol level less than 5.  Urine  drug screen pan-negative.  UA, dirty with small blood, nitrites  positive, and leukocyte is moderately positive.  Point-of-care cardiac  enzymes, CK-MB and troponin are negative.  However, myoglobin is  elevated at 269 (normal value is 200).   STUDIES:  Abdomen and chest x-ray shows stable 5-mm left  nephrolithiasis.   EKG:  Unchanged from prior studies.  Note, there are anterior Q  waves  present.   ASSESSMENT AND PLAN:  This is a 65 year old male with altered mental  status, hypercalcemia, and probable acute pancreatitis.  The  differential for his abdominal pain includes acute pancreatitis,  ureteral stone versus pyelonephritis, diverticulitis, and gallbladder  pathology.   1. Abdominal pain.  His elevated amylase and his  alcohol use indicates      pancreatitis.  His Ranson score is 1 which is low risk.  We will      bolus normal saline 1 L, then provide D5 half-normal saline at 200      per hour overnight, and keep n.p.o.  We will be sure to measure his      fluids, in's and out's to be able to assess one component of the 48-      hour Ranson score.  We will also provide pain control in the form      of IV morphine and we will continue to evaluate for the possibility      of urinary system pathology generate his pain.  His current UA      could indicate a UTI versus pyelonephritis.  However, it is most      likely not a clean catch.  We will obtain UA with cath to see if it      improves.  If it still looks dirty, we will Gram stain culture or a      nonfasting abdominal CT to assess for ureteral stones, and treat      with its appropriate antibiotics.  2. Hypercalcium.  He has existing primary parathyroidism.  We will      remeasure his calcium level after IV fluids and we will consider      Lasix, calcitonin, and bisphosphonate.  3. Altered mental status.  Several probable causes for his altered      mental status include:      a.     Alcohol withdrawal.      b.     Hypercalcemia.      c.     Hepatic encephalopathy.      d.     IV pain medications.  Plan:  We will obtain ammonia level.  We will follow his mental status  overnight.  We will also treat his alcohol withdrawal, and follow his  pain medication dosing.  1. Alcohol withdrawal:  We will provide CIWA protocol in Step-Down as      appropriate.  His clonus could be due to alcohol withdrawal.  2.  Hypertension.  Likely, has essential hypertension chronically.      However, his systolic blood pressure puts him into crisis level.      His high levels are likely secondary to alcohol withdrawal and      pain.  As noted, we will treat his alcohol withdrawal and we will      provide also IV labetalol 200 mg IV p.r.n. for elevated systolic      blood pressure and continue to follow.  We will change plans if      this is insufficient.  We feel safe giving a beta-blocker to this      patient, as his brother reports no current history of cocaine use,      and his urine drug screen was negative for cocaine.  3. Schizophrenia is stable and we will provide his home dose of      Zyprexa.  We will attempt to contact his psychiatrist in the      morning.  4. Acute renal failure.  His baseline creatinine was 1.2 on his last      hospital admission discharge.  We will follow his creatinine level      in the morning after rehydration and treat if needed.     ______________________________  Veatrice Bourbon, Medical Student  ______________________________  Brion Aliment, M.D.    EC/MEDQ  D:  08/11/2008  T:  08/12/2008  Job:  [id number]

## 2011-04-12 NOTE — Op Note (Signed)
NAMEANTWIONE, [name]             ACCOUNT NO.:  [id number]   MEDICAL RECORD NO.:  [id number]          PATIENT TYPE:  INP   LOCATION:  52                         [facility]ACILITY[facility]  [facility]   PHYSICIAN:  Lillette Boxer. Dahlstedt, M.D.DATE OF BIRTH:  [birth year]   DATE OF PROCEDURE:  DATE OF DISCHARGE:                               OPERATIVE REPORT   PREOPERATIVE DIAGNOSIS:  Impacted proximal left ureteral stone.   POSTOPERATIVE DIAGNOSIS:  Impacted proximal left ureteral stone.   PROCEDURE PERFORMED:  Open left proximal ureterolithotomy.   SURGEON:  Lillette Boxer. Diona Fanti, M.D.   ASSISTANT:  Aron Baba, M.D.   ANESTHESIA:  General.   INDICATIONS FOR PROCEDURE:  [name] is a 65 year old male with a  history of an impacted proximal left ureteral stone.  He also had a  bladder stone for which he underwent cystolithalopaxy.  Attempts at  retrograde and antegrade stenting of the ureter were unsuccessful and  the patient had persistent hydronephrosis.  He had a percutaneous  nephrostomy tube placed.  He presents for left open ureterolithotomy.   DESCRIPTION OF PROCEDURE IN DETAIL:  The patient brought to the  operating room and was identified by his arm band. Informed consent  verified and preoperative time-out performed.  After successful  induction of general endotracheal anesthesia the patient was moved to  the right side down flank position and the table flexed.  A bean bag was  inflated for padding and all appropriate pressure points were padded to  avoid compression compartment syndrome.  Perioperative antibiotics were  administered.  The patient's flank and abdomen were prepped and draped  in usual fashion.  A 8 cm incision was made off the tip of the twelfth  rib toward the midline.  Blunt sharp dissection was used to carry this  down to external oblique fascia.  This was entered sharply.  The  external oblique and internal oblique were then divided with Bovie  cautery.  We  identified the fascia of the transversus abdominis muscle.  We identified the neurovascular bundle within.  This was reflected  anteriorly and the transversus abdominis muscle was split along the axis  of its fibers.  This allowed [place] access to the retroperitoneum.  The  Bookwalter retractor was then placed and we exposed the left  retroperitoneum.  We identified the left lower pole.  We dissected  laterally and posteriorly and identified the psoas minor tendon running  along the psoas major muscle.  We used blunt dissection then to carry  this medially, identified the gonadal vessels and posterior to that, the  ureter.  This was freed up and a right angle was placed around it and we  subsequently elevated it with a vessel loop.  We palpated the stone  within the ureter just proximal to the area of our vessel loop.  We then  made a 2 cm ureterolithotomy with a hook knife blade up.  The stone was  visualized within the lumen of the ureter.  It was grasped and  externalized.  A open-ended catheter was placed proximally and distally  and ensured that there was no additional obstruction.  We then placed 6  x 24 double-J ureteral stent over a glide wire.  We then closed the  ureterotomy with a running 4-0 PDS.  The wound was irrigated.  We  brought a Jackson-Pratt drain out the inferior aspect through a stab  incision.  The internal oblique and transversalis were closed with a  single #1 PDS.  The external oblique was closed with another running #1  PDS.  Subcutaneous wound was irrigated.  Skin was closed with skin  staples.  The drain was sewn in place with 3-0 nylon.  At this time the  procedure was terminated.  Please note we did remove the nephrostomy  tube at the end of the case.  The patient tolerated procedure well and there were no complications.  Franchot Gallo was the attending primary responsible physician  present participated in all aspects of the procedure.   DISPOSITION:  The  patient was extubated uneventfully and transferred  safely the Post Anesthesia Care Unit.     ______________________________  Aron Baba, MD      Lillette Boxer. Dahlstedt, M.D.  Electronically Signed    RJ/MEDQ  D:  09/18/2007  T:  09/19/2007  Job:  [id number]

## 2011-04-12 NOTE — Op Note (Signed)
NAMESHONTA, [name]             ACCOUNT NO.:  [id number]   MEDICAL RECORD NO.:  [id number]          PATIENT TYPE:  AMB   LOCATION:  DAY                          FACILITY:  [facility]   PHYSICIAN:  Reece Packer, MD DATE OF BIRTH:  09-05-1946   DATE OF PROCEDURE:  08/16/2007  DATE OF DISCHARGE:                               OPERATIVE REPORT   PREOPERATIVE DIAGNOSIS:  Left ureteral stone, bladder stone.   POSTOPERATIVE DIAGNOSIS:  Impacted left ureteral stone, bladder stone.   PROCEDURES PERFORMED:  1. Cystoscopy.  2. Left retrograde pyelography.  3. Laser cystolitholapaxy.   SURGEON:  Bjorn Loser, M.D.   ASSISTANT:  Finis Bud.   ANESTHESIA:  General.   INDICATION FOR PROCEDURE:  [name] is a 65 year old male  hospitalized with alcoholic pancreatitis.  He has an associated finding  of hypercalcemia.  He has bilateral renal stones, a 1.2 cm left ureteral  stone with resultant hydronephrosis.  He also has a 2 cm bladder stone.  The patient is brought for an attempted left ureteral stent placement as  well as definitive management of his bladder stone.   DESCRIPTION OF PROCEDURE IN DETAIL:  The patient was brought to the  operating room.  He was identified by his armband.  Informed consent was  verified and a preoperative time-out was performed.  After the  successful induction of general anesthesia, the patient was moved to the  dorsal lithotomy position.  All appropriate pressure points were padded  to avoid a compression compartment syndrome.  Sequential compression  devices were employed and perioperative antibiotics were administered.  The perineum was prepped and draped.  The surgeon was gowned and gloved.  A 22 French cystoscopic sheath was used to introduce a 30 degree  cystoscopic lens transurethrally into the bladder, a Pentax urethroscopy  was performed.  Trilobar hypertrophy of the prostate was noted.  Bladder  was 1+ trabeculated.  It was free of any  mucosal lesions, erythema.  There was a large 2.5 cm bladder stone in the dependent position.  Bilateral ureteral orifices were noted in normal anatomic position.  The  right was seen to efflux clear urine.  Attention was turned to the left  ureteral orifice.  It was cannulated with a central wire.  This was  advanced under fluoroscopic control to the proximal ureter.  On scout  imaging there was an approximately 1 cm calcification consistent with  the stone seen on helical CT scan.  Attempts at manipulating the guide  wire past this stone were unsuccessful.  We then introduced the 6-French  end-hole catheter via the Seldinger technique into the mid ureter and  removed the wire.  Contrast was injected and a left retrograde pyelogram  was performed.   A left retrograde pyelogram revealed a dilated tortuous left proximal  ureter.  No contrast was seen to travel proximal to the obstructing  stone.   Again, an angled glide wire was inserted and attempts at manipulating  the wire past the stone were made.  The patient was placed in steep  Trendelenburg and both sensor wires and glide wires were used.  We were  unable to pass the glide wire at this point.  The decision was made to  terminate and proceed with the remainder of the procedure.  The end-hole  catheter and glide wire were removed and a 500 micron laser fiber was  inserted via the working part of the scope.  With settings of 1.0 joules  at 10 hertz for a total power of 10 watts, systematic laser  cystolitholapaxy was undertaken.  The stone was systematically  fragmented into submillimeter pieces.  Total energy was 31.65  kilojoules.  Once the stone was sufficiently fragmented, a resectoscopic  sheath was inserted via blunt obturator and the bladder was irrigated  free of fragments.  Once we achieved a sufficient stone-free state the  bladder was filled.  It was reinspected and there was no evidence of  perforation.  We then removed  the sheath, inserted a 24-French Ainsworth  catheter with 20 mL sterile water in the balloon which was placed to  straight drain.  Drainage was clear.  At this time the procedure was  terminated.  [name] was the attending primary responsible  physician, was present and participated in all aspects of the procedure.   DISPOSITION:  The patient was extubated uneventfully, transported safely  to the [facility].  He will need a percutaneous  nephrostolithotomy and subsequent definitive stone management.  He will  be transferred back to his primary team at [facility].     ______________________________  Tempie Donning, MD  Electronically Signed    JR/MEDQ  D:  08/16/2007  T:  08/16/2007  Job:  [id number]

## 2011-04-12 NOTE — H&P (Signed)
[name], [name]             ACCOUNT NO.:  [id number]   MEDICAL RECORD NO.:  [id number]          PATIENT TYPE:  INP   LOCATION:  [id number]                         FACILITY:  [facility]   PHYSICIAN:  Peterson Lombard, MD         DATE OF BIRTH:  [birth year]   DATE OF ADMISSION:  09/18/2007  DATE OF DISCHARGE:                              HISTORY & PHYSICAL   CHIEF COMPLAINT:  Here for surgery.   HISTORY OF PRESENT ILLNESS:  [name] is a 65 year old male with  known nephrolithiasis.  He has an upper proximal ureteral stone.  Attempts have been made at stenting in both anterior and retrograde  fashion.  These were unsuccessful.  He has a percutaneous nephrostomy  tube in place.  He presents for open ureterolithotomy as the patient is  not a candidate for shock wave lithotripsy.   PAST MEDICAL HISTORY:  1. Hypertension.  2. Nephrolithiasis.  3. Bladder stones.   PAST SURGICAL HISTORY:  Cystolithalopaxy.   MEDICATIONS:  None.   ALLERGIES:  None.   SOCIAL HISTORY:  The patient is on disability.  He smokes a pack per day  times 35 years.  He has several alcoholic drinks daily.   REVIEW OF SYSTEMS:  CONSTITUTIONAL:  Negative for fever and sweats.  SKIN:  Negative for rashes.  EYES:  Negative for double or blurred vision.  ENT:  Negative for sore throat or sinusitis.  IMMUNOLOGIC:  Negative for swelling glands or easy bruising.  CARDIOVASCULAR: Negative for leg swelling, positive for chest pain.  RESPIRATORY:  Negative for cough, shortness of breath.  ENDOCRINE:  Negative for thyroid gland dysfunction or diabetes mellitus.  MUSCULOSKELETAL:  Negative for back and joint pain.  GASTROINTESTINAL:  Negative for nausea, vomiting, diarrhea,  constipation.  NEUROLOGICAL:  Negative for headache and dizziness.   PHYSICAL EXAMINATION:  VITAL SIGNS:  Afebrile with stable vital signs.  GENERAL:  In no acute distress.  HEENT:  The patient is nearly edentulous with one remaining incisor.  NECK:   Supple.  No lymphadenopathy.  CHEST:  Clear to auscultation.  CARDIOVASCULAR:  Regular rate and rhythm.  ABDOMEN:  Soft, nontender, nondistended.  GU: Uncircumcised phallus.  Testes descended bilaterally.  Normal in  contour.  SKIN:  Warm and well-perfused without rashes or lesions.  NEUROLOGICAL:  The patient is alert and oriented and has appropriate  affect.   IMAGING:  Percutaneous nephrostomy tube with nephrostogram indicates  complete obstruction at the proximal ureter.  This is confirmed by  helical CT scan of the abdomen and pelvis.   PLAN:  The patient has failed antegrade and retrograde management of his  obstructing ureteral stone.  He presents for open ureterolithotomy.      Peterson Lombard, MD     JH/MEDQ  D:  09/18/2007  T:  09/19/2007  Job:  [id number]

## 2011-04-12 NOTE — Discharge Summary (Signed)
NAMEEGAN, [name] NO.:  [id number]   MEDICAL RECORD NO.:  [id number]          PATIENT TYPE:  INP   LOCATION:  [id number]                         FACILITY:  [facility]   PHYSICIAN:  Lucy Chris, MD     DATE OF BIRTH:  Jan 09, 1946   DATE OF ADMISSION:  08/10/2007  DATE OF DISCHARGE:  08/22/2007                               DISCHARGE SUMMARY   DISCHARGE DIAGNOSES:  1. Multiple stones in kidney and bladder.  2. Pancreatitis, resolved.  3. Urinary tract infection.  4. Hyperparathyroidism with a single parathyroid adenoma.  5. Hypercalcemia, secondary to primary hyperparathyroidism.  6. Schizophrenia, well controlled  7. Hypophosphatemia  8. Hypomagnesemia  9. Impacted kidney stone in the left UVP junction, s/p nephrostomy      placement.   DISCHARGE MEDICATIONS:  Include:  1. Vicodin 5/500 p.o. q.6 hours as needed for pain.  2. Neutra-Phos two packets p.o. t.i.d. with every meal.  3. Magnesium sulfate 400 mg p.o. t.i.d. with each meal.  4. Ciprofloxacin 500 mg p.o. b.i.d. for two more days after discharge.  5. Zyprexa 20 mg p.o. each night at bedtime.   Followup appointments have been made with:  1. Dr. Zenia Resides at [facility] on October 6 at 3:00 p.m. for      evaluation and scheduling for a parathyroidectomy.  2. Dr. Diona Fanti with [facility] on October 2 at      2:15 for an open lithotomy to remove the left ureter impacted      stone.  3. An appointment with Dr. Elana Alm at [facility] on October 1 at 9:30      a.m. for followup post hospitalization and to follow up on      hypercalcemia, hypomagnesemia, hypophosphatemia secondary to      hyperparathyroidism.  Patient will need a B-Met with mag and phos      levels.  Patient's electrolytes needs to be monitored until      definitive surgical treatment for underlying hyperparathyroidism      can be done.   PROCEDURES DONE:  1. On September 12, an abdominal ultrasound for right upper  quadrant      pain and possible kidney stones based on the KUB in the ED was      done:  Cholelithiasis, but no cholecystitis was visualized;      bilateral echogenic kidney; renal calculi bilaterally; complex cyst      on the right kidney; left hydronephrosis and an obstructing stone      were seen.  2. On September 13, a CT with and without contrast of the abdomen and      pelvis was performed:  Confirmed the findings of the ultrasound      with no features of pancreatitis visualized.  3. September 17, a parathyroid scintigraphy was done consistent with      parathyroid adenoma in the inferior portion of the right lobe of      the thyroid gland; possible secondary adenoma in the mid-to-upper      pole of the left lobe of the thyroid was also seen.  4. September 18,  a bladder stone removal per urology.  5. September 19, via interventional radiology a percutaneous      nephrostomy was placed due to an impacted stone.  6. September 22, CT without contrast of the abdomen and pelvis for      evaluation of the drain and the stoma.  7. September 23, an attempted stent placement in the obstructive      ureter; it was unsuccessful.  Interventional radiology was unable      to get a guidewire pass the stone so the nephrostomy catheter was      replaced and drain was left in at discharge.   Dr. McDiarmid of urology was consulted on this patient.   HISTORY AND PHYSICAL:  Patient presented to the ED on September 12  complaining of vomiting, abdominal and back pain.  Patient is a 65-year-  old African-American male with a history of heavy alcohol use and  schizophrenia.  He developed abdominal pain three days prior to  admission; it began in his lower quadrant and gradually moved up to his  epigastric area.  He began vomiting one day prior to admission, but had  managed to keep some food down.  The pain also was felt in his chest  wall; he could point to the area of worse pain in his epigastric  area  which was located somewhat in his midline.  He denied any diarrhea,  hematochezia, or melena or hematemesis.  Patient described the pain as  also in his back in the lumbosacral area.  Three months prior to  admission, patient had developed dysuria, postvoid dribbling and  increased frequency and nocturia, also accompanied by back pain which  continues to be a problem on admission.   VITAL SIGNS:  On exam, patient's temperature was 97.3, BP of 171/103,  pulse of 74, respiratory rate of 18.  GENERAL:  He was reclined comfortably.  EYES:  PERRLA.  Nonicteric.  NECK:  Soft.  No masses palpated.  LUNGS:  Respirations were clear to auscultation bilaterally, had good  airflow with no wheezes, crackles or rales.  CARDIOVASCULAR SYSTEM:  Regular rate and rhythm.  No murmurs  appreciated.  Good peripheral pulses.  GI:  Patient's abdomen was nondistended, diffusely tender.  No  noticeable guarding, but grimacing with palpation.  It was most tender  in left upper quadrant and midepigastrium with no rebound tenderness.  No masses palpated.  No hepatosplenomegaly.  No bruises.  GENITOURINARY:  No prostate hypertrophy was appreciated.  He had scant  pus at the meatus and his penis, stool in his rectal vault, stool was  brown.  Patient had one right inguinal adenopathy present.  NEUROLOGICALLY:  He was alert and oriented x3.  His cranial nerves  intact.  Strength 5/5 in all extremities.  No focal neurological  deficits.   LABS:  Sodium 138, potassium 3.5, chloride 104, bicarb 23, BUN 113,  creatinine 1.48, glucose of 104.  White blood cell count 7.8, hemoglobin  16.3, hematocrit 48.8, platelets of 339.  Absolute neutrophil count was  0.9, MCV of 90.9.  Total protein of 7.9, albumin of 3, calcium of 14.  Patient's UA was cloudy with no ketones, a few bacteria, 7-10 white  blood cells and positive leukocyte esterase.  Serial cardiac enzymes  were obtained all of which were negative.    HOSPITAL COURSE:  1. Multiple stones.  These stones were visualized on a KUB in the ED,      ultrasound to evaluate, CT confirmed.  We consulted urology.      Stones were visualized in both kidneys, left ureter and bladder.      The bladder stone was removed by urology on September 19; they      attempted to stent in anticipation of lithotripsy the left ureter      with the impacted stone on September 19, but were unable to do so.      So, a percutaneous nephrostomy drain was placed by interventional      radiology.  On September 13, interventional radiology again      attempted to stent and was unable to; they left the drain in and we      arranged for an open removal of his left ureter stone.  During his      hospital course, the patient had bumps in his creatinine levels      secondary to the stones.  At discharge, patient's creatinine was      1.25. His stones are likely secondary to his primary      hyperparathyroidism.  2. Pancreatitis.  The patient presented with left upper quadrant pain      and pain postprandially.  Patient's liver function tests were      elevated and had a lipase of 575.  Patient was kept n.p.o. with      hydration and pain control.  On September 17, a PICC line was      placed and TNA was begun.  By September 19, patient's LFT levels      and lipase levels had dropped significantly; his physical exam had      become benign and was able to tolerate a regular diet without pain      and pain medications.  it is likely that his pancreatitis was      secondary to his hypercalcemia.  3. UTI.  He presented with dysuria of three months' time at admission.      His UA, positive bacteria and leukocyte esterase.  Patient was put      on Cipro 400 mg q.12 for 14 days to treat complicated UTI.  His      dysuria resolved.  Patient had two more days at discharge to take      his Cipro, screening for STDs was negative.  4. Hyperparathyroidism.  At admission, patient had a  calcium of 14.8.      Patient was given calcitonin, IV fluids and Lasix to lower his      calcium level.  Per patient's records, his calcium level was      apparently a chronic problem.  Patient's hypercalcemia was worked      up and a PTH level of 244 was found making hyperparathyroidism very      likely; parathyroid scintigraphy confirmed this.  Patient has a      surgery appointment for a parathyroidectomy, was given      bisphosphonates twice while in hospital to control his calcium      level.  Mag and phos required repletion secondary to his      hyperparathyroidism.  Electrolyte levels of calcium, magnesium and      phosphorous need to be followed up by Dr. Elana Alm at [facility] until      definitive surgical treatment can be done.   At discharge, patient's vitals were 98 temperature, blood pressure of  109/64, pulse of 78, respirations 20, 97% on room air.   Sodium 135, potassium 3.6, chloride of 109, bicarbonate of 22,  BUN of  18, creatinine 1.25 and glucose of 80.  Total bilirubin 0.6, alk phose  of 62, AST of 14, ALT of 11, total protein 5.8, albumin 2.3 and a  calcium of 8.7.      Jacolyn Reedy, M.D.  Electronically Signed      Lucy Chris, MD  Electronically Signed    JC/MEDQ  D:  08/23/2007  T:  08/23/2007  Job:  [id number]

## 2011-04-12 NOTE — Discharge Summary (Signed)
[name], [name]             ACCOUNT NO.:  [id number]   MEDICAL RECORD NO.:  [id number]          PATIENT TYPE:  INP   LOCATION:  [id number]                         FACILITY:  [facility]   PHYSICIAN:  [name], M.D.DATE OF BIRTH:  Sep 10, 1946   DATE OF ADMISSION:  08/11/2008  DATE OF DISCHARGE:  08/18/2008                               DISCHARGE SUMMARY   DATE OF DISCHARGE:  August 18, 2008, pending nursing home placement.   PRIMARY CARE PHYSICIAN:  Unassigned.   DIAGNOSES:  1. Acute pancreatitis.  2. Schizophrenia.  3. Acute delirium.  4. Alcohol withdrawal.  5. History of multiple kidney stones.  6. Hyperparathyroidism with single parathyroid adenoma.  7. Alcohol dependence.  8. Tobacco abuse.   DISCHARGE MEDICATIONS:  1. Zyprexa 30 mg p.o. q.h.s.  2. Multivitamin.  3. Amlodipine 10 mg p.o. daily.   PROCEDURES:  1. Chest and abdomen x-ray showing nonobstructive bowel gas pattern      with no free air and stable 5 mm left nephrolithiasis.  2. Head CT without contrast dated August 15, 2008, showing no acute      intracranial abnormalities with chronic small vessel ischemia      disease with progression since 2007.   LABORATORY DATA:  For details of admission labs, please see H&P.  However,  significant findings were elevated calcium of 15.1, elevated  amylase of 26, elevated lipase of 759.  Normal coags.  Normal bilirubin.  Normal AST/ALT.  Alcohol level was undetectable.  Blood screen was  negative.  Point of care enzymes were negative x1.  Throughout the  course of stay, his urinalysis indicated infection confirmed x2.  Urine  culture grew greater than 100,000 multiple bacteria forms x2.  This was  thought to be asymptomatic bacterial bacteruria.   DISCHARGE LABORATORIES:  Basic metabolic panel significant for potassium  of 3.1, creatinine 1.58.  Stable at baseline.  Calcium 13.6.  Lipase  drawn September 17 was 80.  Last albumin was drawn on September 17  which  was 3.1.   BRIEF HOSPITAL COURSE:  This is a 65 year old man with past history  significant for schizophrenia, acute pancreatitis, altered mental  status, likely acute delirium.   PROBLEMS:  1. Acute pancreatitis.  [name] was admitted with abdominal pain      and anorexia.  His amylase and lipase are significantly elevated.      He was treated with n.p.o. and aggressive fluid rehydration and      narcotic pain relief.  He tolerated these interventions very well,      and his amylase and lipase drastically decreased over the course of      two days with near normal last check on September 17 of lipase.  He      did not complain of any acute current abdominal pain.  We have      advised [name] and his brother to avoid alcohol if this would      likely contribute to his pancreatitis.  Also of note, we suspect      his elevated calcium to have contributed to his pancreatitis, and  would advise him to seek follow up on this problem.  2. Elevated calcium.  On [name]'s last admission, his calcium      was in the 14s.  He was diagnosed with a parathyroid adenoma, thus      primary hyperparathyroidism.  His calcium is likely chronically in      the 14s.  We treated him with aggressive fluid hydration.  His      calcium level never really decreased during the course of his stay.      We feel that this would be an issue for his primary care Ralonda Tartt      as an outpatient to be addressed.  He will likely need surgical      intervention for his thyroid disease or long-term bisphosphonate      therapy.  We elected not to use oral bisphosphonate secondary to      his acute delirium during the course of his stay.  3. Acute delirium.  [name]'s baseline mental status was of      someone who mostly stayed at home but would get up and go shopping      for alcohol and other items, would do some activities of daily      living for himself.  [name] was admitted with  mental status      changes, oriented x1-2.  We determined this to be secondary to      alcohol withdrawal and opiate administration secondary to pain.  We      provided him with Ativan for alcohol detox.  This resulted in his      worsening mental status to the point that he became responsive to      sternal rub and loud voices.  We discontinued Ativan and used it      sparingly during the course of the stay.  By Friday, September 18,      [name] was more awake and alert over the weekend with no      Ativan.  He became even more awake and alert.  By September 21, he      was oriented x3, conversant and actively participating in some      discharge planning.  We feel this is at or very close to his      baseline.  However, this still is somewhat altered.  Possibly, he      has some baseline dementia.  We would advise on further subsequent      admissions from [name] to use Ativan sparingly as he seems      very sensitive to this medication.  However, we understand the      necessity to avoid the sequelae of alcohol withdrawal.  4. Hypertension.  [name] had systolic blood pressures in the      180s to [id number] and diastolic in the [id number] to [id number].  This was likely      secondary to pain.  Although, we are very confident that he has      underlying hypertension that has not been very well controlled      secondary to schizophrenia and poor medication compliance.  We      initially treated with beta blocker.  However, we discontinued beta      blocker secondary to bradycardia.  We felt we should avoid thiazide      diuretics or ACE inhibitors as he did have some electrolyte      abnormalities.  We  did not want to make worse.  Thus, we decided to      use amlodipine, initially started at 5 mg and subsequently going to      10 mg by September 21.  This seemed to be controlling his blood      pressure relatively well.  His systolic blood pressures ranged in      the 150s to 160s by  September 21.  Thus, we increased amlodipine      dose.  He should follow up with his primary care Dhairya Corales to      further titrate his blood pressure medications.  5. Schizophrenia.  [name] has stable, well-controlled      schizophrenia that he treats with 30 mg of Zyprexa at night.  He      goes to [facility] for his schizophrenia treatment.  He should      follow up with [facility] or his primary care Christine Morton to      continue treating his schizophrenia.  6. Alcohol withdrawal.  Mr. Ines drinks approximately 3-4 24-ounce      beers per day.  He came in alcohol free for several days as      indicated by history and his negative alcohol level.  He did have      some neurological changes that could indicate alcohol withdrawal.      He had questionable asterixis. However, his exam was not 100%      consistent with this.  He also had 1-2 beats of clonus bilaterally      on his feet and some stiffness.  It is possible this is also      secondary to his schizophrenia and schizophrenia treatment.  As      noted in acute delirium, he was treated with Ativan successfully.      We avoided any seizure activity.  By date of discharge, he had been      alcohol free for at least 9 days and Ativan free for 4 days.  We      feel that he is outside of the realm of alcohol withdrawal upon      discharge.  Of note, we also discussed avoiding alcohol with Mr.      Berkey and his brother, and we will encourage him to avoid      alcohol in the skilled nursing facility and any long-term care      facilities he does wind up in.  7. Tobacco abuse.  Mr. Sudbury is a pack a day smoker.  Has been so      for many years.  He did not require any nicotine repletion while in      hospital.  This is likely secondary to his mental status changes.      He did not ask for any, upon resolution of his mental status      changes.  We would encourage Mr. Lopezramirez to avoid using any      tobacco in the  future, and we would encourage his primary care      Tamula Morrical to provide nicotine repletion as needed.  8. Kidney stones.  Mr. Wengel has a history of multiple kidney      stones.  X-ray showed kidney stone in the right renal pelvis, not      in the ureter.  He had no symptoms.  Significant ureteral      irritation including negative costovertebral angle tenderness.  We  would encourage him to follow up with Urologist and his primary      care Jahlil Ziller.  Of note, for subsequent admissions, complaining of      abdominal pain, please consider this as a possibility.  We felt the      pancreatitis was much more likely upon admission, and he remained      pain free upon resolution of his mental status changes on September      21.   DISCHARGE INSTRUCTIONS:  1. Diet:  Low-sodium, heart-healthy diet.  2. Discharge instructions were provided verbally and written to the      SNF.  We will get social worker to also provide written      instructions for discharge.  We encouraged Mr. York to avoid      alcohol and tobacco in the future, and to follow up with his doctor      visits.   FOLLOWUP:  Mr. Evilsizor is being discharged to skilled nursing facility.  The primary care Deoni Cosey should take control of his primary illnesses.   CONDITION ON DISCHARGE:  The patient was discharged to SNF in stable  medical condition.   Dictated by Lynne Leader, MS      Brion Aliment, M.D.  Electronically Signed      Seth Collin. Andria Frames, M.D.  Electronically Signed    JH/MEDQ  D:  08/18/2008  T:  08/18/2008  Job:  [id number]

## 2011-04-12 NOTE — Consult Note (Signed)
NAMERIMAS, BERTOT NO.:  [id number]   MEDICAL RECORD NO.:  [id number]          PATIENT TYPE:  INP   LOCATION:  5028                         FACILITY:  [place]   PHYSICIAN:  Felizardo Hoffmann, M.D.  DATE OF BIRTH:  1946/04/21   DATE OF CONSULTATION:  08/20/2008  DATE OF DISCHARGE:                                 CONSULTATION   REQUESTING PHYSICIAN:  Dalbert Mayotte, MD   REASON FOR CONSULTATION:  Mental status changes, assess capacity.   HISTORY OF PRESENT ILLNESS:  [name] is a 65 year old male  admitted to the [facility] on August 11, 2008, due to  pancreatitis.   [name] has been displaying approximately 3 weeks of impairment in  his memory, as well as orientation.  He has been displaying these  symptoms on presentation.  When he was asked what state it was, he  stated [name] and when asked what [place] and date, he stated  [name] for both questions.  He did not know the year or the day of  the week.  He has continued on his Zyprexa 30 mg nightly.  He describes  very low volume auditory hallucinations which he can easily ignore.   He does continue with impairment in judgment and insight.   PAST PSYCHIATRIC HISTORY:  [name] does have a history of a  diagnosis of schizophrenia with episodes of auditory hallucinations  which have improved with his Zyprexa 30 mg nightly.   He does have a long-term history of severe alcohol abuse.   FAMILY PSYCHIATRIC HISTORY:  None known.   SOCIAL HISTORY:  [name] occupationally is disabled.  He does have  a long-term history of severe alcoholic chronic use.  He has been  drinking alcohol daily up to admission.  He has been drinking three to  four 24 ounces beers per day.   He has been living with his brother.  He does have a remote history of  marijuana and crack use.  He does have a history of being in prison.   PAST MEDICAL HISTORY:  1. Primary hyperparathyroidism.  2. History of  nephrolithiasis.  3. Pancreatitis.  4. Hypertension.  5. Acute renal failure.   MEDICATIONS:  MAR is reviewed.  He is on Zyprexa 30 mg nightly.   ALLERGIES:  NO KNOWN DRUG ALLERGIES.   Head CT without contrast showed no acute abnormality.  There was the  presence of chronic small vessel disease.   Sodium 141, BUN 13, creatinine 1.71, glucose 109.  WBC 7.8, hemoglobin  13.2, platelet count 337, SGOT 17, SGPT 9.   The patient's lipase on August 11, 2008, was extremely elevated at  759, amylase was 2526.   Ammonia, urine drug screen, alcohol and INR all unremarkable.   REVIEW OF SYSTEMS:  Constitutional, head, eyes, ears, nose, throat,  mouth, neurologic, psychiatric cardiovascular, respiratory,  gastrointestinal, genitourinary, skin, musculoskeletal, hematologic,  lymphatic, endocrine and metabolic all unremarkable.   PHYSICAL EXAMINATION:  VITAL SIGNS:  Temperature 97.8, pulse 53,  respiratory rate 18, blood pressure 127/77.  O2 saturation on room air  100%.  GENERAL APPEARANCE:  Mr.  [name] is a middle-aged male sitting up in  his hospital chair with no abnormal involuntary movements.   MENTAL STATUS EXAM:  [name] does have intact eye contact; however,  his attention span is decreased.  Concentration mildly decreased.  Affect is slightly anxious at baseline with some appropriate social  smiling.  Mood is within normal limits.  Memory testing; 3/3 immediate,  1/3 at 3 minutes.  On orientation testing, he is asked the year and he  states that the month is October for the question of what year it is.   He is able to state the place, as well as person.   Fund of knowledge and intelligence are below average.  Speech involves  slightly flat prosody.  There is no dysarthria.  Thought process  involves some alogia.  There are no looseness of associations.  Thought  content; he describes very low volume non-command destructive auditory  hallucinations that do not disturb  him.  He has no thoughts of harming  himself or others.  He has no delusions.  His insight is poor.  Judgment  is impaired.  He particularly has poor insight into the general medical  complications of his alcohol pattern, as well as the degree of his  alcohol use.   ASSESSMENT:  AXIS I:  293.82, psychotic disorder, not otherwise  specified.  The patient may have further improvement in his  hallucinations by continuing the Zyprexa 30 mg daily, however, at this  time he does display a typical stable improvement for a patient with a  diagnosis of schizophrenia.  Many patients achieve only a reduction of  the hallucinations to a non-disturbing level at their best treatment  efficacy.  1. Rule out schizophrenia, chronic, undifferentiated type, currently      stable.  2. Alcohol dependence.  3. 294.9, unspecified persistent mental disorder.  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  AXIS II:  None.  AXIS III:  See past medical history.  AXIS IV:  Primary support group, general medical.  AXIS V:  Global Assessment of Functioning 30.   [name] displays critical deficits in reasoning, as well as  appreciation for his risks.  He also has critical memory impairment.  He  does not have the capacity for informed consent.   RECOMMENDATIONS:  [name] will need 24-hour per day supervision  outside of the hospital.   1. Thiamine 100 mg daily indefinitely.  2. Would continue Zyprexa 30 mg daily for anti-psychosis.  3. At this point, [name] cannot function for benefit in a 12-      step program; however, if he does recover memory function with his      thiamine treatment, he would be a candidate for an inpatient      chemical dependence rehabilitation program along with 12-step      method and meetings.   Regarding standard psychiatric follow-up, would have [name] follow  up with one of the outpatient psychiatric clinics assigned to [facility], [facility] or [facility].  It is anticipated that  he would be transported to and from these clinics from a nursing home  given his requirement 24 hour per day supervision.      Felizardo Hoffmann, M.D.  Electronically Signed     JW/MEDQ  D:  08/20/2008  T:  08/20/2008  Job:  [id number]

## 2011-04-12 NOTE — Discharge Summary (Signed)
[name], [name] NO.:  [id number]   MEDICAL RECORD NO.:  [id number]          PATIENT TYPE:  INP   LOCATION:  5028                         FACILITY:  [facility]   PHYSICIAN:  Dalbert Mayotte, MD        DATE OF BIRTH:  1946/04/20   DATE OF ADMISSION:  08/11/2008  DATE OF DISCHARGE:  08/25/2008                               DISCHARGE SUMMARY   PRIMARY CARE Tichina Koebel:  Althia Forts.   DISCHARGE DIAGNOSES:  1. Acute pancreatitis.  2. Delirium.  3. Schizophrenia.  4. Hypertension.  5. Hypercalcemia.  6. Primary hyperparathyroidism.  7. History of nephrolithiasis.  8. History of pancreatitis.  9. Acute renal failure.  10.Alcoholism.  11.Tobacco abuse.   DISCHARGE MEDICATIONS:  1. Zyprexa 30 mg 1 tablet by mouth daily.  2. Multivitamin 1 pill by mouth daily.  3. Flomax 0.4 mg 1 tablet p.o. daily.  4. Thiamine 100 mg 1 tablet p.o. daily.  5. Keflex 500 mg 1 tablet p.o. t.i.d. x4 days.  6. Norvasc 10 mg 1 tablet p.o. daily.   CONSULTANTS:  Psychiatry.   PROCEDURES:  1. CT scan of head, no acute intracranial abnormality and chronic      small vessel ischemic disease with progression.  2. Chest x-ray, no acute cardiopulmonary disease.  3. Abdominal x-ray, nonobstructive bowel gas pattern, no free air,      stable 5 mm left nephrolithiasis.   LABS:  1. CMET on August 11, 2008; glucose 129, BUN 19, creatinine 1.65,      AST 17, ALT 12, total bilirubin 1.0, calcium 14.1.  2. Alcohol level less than 5.  3. UA on August 11, 2008, positive nitrite and moderate leukocytes.  4. Blood lipase on August 11, 2008, 759.  5. Serum amylase on August 11, 2008, 2526.  6. Blood ammonia 29.  7. Urine culture, multiple bacterial morphotypes x2.  8. BMET on August 22, 2008, BUN 20, creatinine 1.88, calcium 13.8.   BRIEF HOSPITAL COURSE:  The patient is a 64 year old male with a history  of alcoholism and prior pancreatitis, who presents with a history of  recurrent  pancreatitis and altered mental status.  1. Pancreatitis.  The patient had elevated amylase and lipase on      admission.  The patient was complaining of abdominal pain.  The      patient was thought to have an acute pancreatitis, thought to be      related to hypercalcemia and his alcoholism.  The patient was      treated with IV fluids and with pain and nausea control.  The      patient did much better with this and improved clinically with the      administration of IV fluids.  His abdominal pain improved and the      patient was able to tolerate p.o. food, solids and liquids.  The      patient's lipase was monitored serially and continued to trend      down.  Most recent lipase on August 14, 2008, was 80.  On the      day of  discharge, the patient was denying any abdominal pain, was      tolerating a regular diet and was feeling much better.  2. Altered mental status.  The patient had altered mental status on      admission.  According to the patient's brother, the patient's      baseline mental status is being alert and oriented x2 to person and      place.  The patient had worsening of his cognition which was      thought to be related to his alcoholism, drug effect, or possibly      Wernicke-Korsakoff syndrome.  The patient's Ativan was held after      he was determined that he was not going through an acute alcohol      withdrawal.  The patient was also treated for a urinary tract      infection.  The patient's mental status improved throughout his      hospitalization.  On the day of discharge, the patient's mental      status had returned to its baseline and he was alert and oriented      x2.  The patient was not having any further episodes of delirium.  3. Schizophrenia.  The patient was continued on his Zyprexa.  The      patient's Zyprexa was increased to 30 mg p.o. daily.  Psychiatry      was consulted for this and for the patient's altered mental status      and did not  recommend any acute intervention.  They did recommend      24-hour supervision for the patient.  4. Dementia.  This was thought to be due to multiple factors.  The      patient may have an underlying element of Korsakoff's.  5. Alcohol abuse.  The patient was initially put on CIWA protocol with      Ativan.  This was discontinued after the patient was found to be      not going to acute alcohol withdrawal and because of altered mental      status.  Psychiatry recommended administration of thiamine as well      as starting multivitamin.  6. Urinary tract infection.  The patient had multiple morphotypes      greater than 100,000 colonies grew out in two urine cultures.  The      patient was also complaining of some pain and burning with      urination, so the patient was started on Keflex.  The patient is to      continue his Keflex for a 7-day course.  7. Hypercalcemia.  The patient has a known diagnosis of primary      hyperparathyroidism.  The patient's calcium remained elevated while      in the hospital.  The patient was asymptomatic from this elevated      calcium.  The patient needs outpatient surgery consult for removal      of the primary hyperparathyroid adenoma for definitive treatment.  8. Primary hyperparathyroidism.  The patient will need outpatient      surgery consult for surgical removal of an adenoma.  9. Acute renal failure.  The patient came in with a creatinine of 1.65      and this remained stable throughout the patient's hospitalization,      unsure of the patient's baseline creatinine.  The patient may have      chronic renal failure as opposed to acute.  Most recent BMET had a      creatinine of 1.88.  10.Hypertension.  The patient was instructed to stop taking his      metoprolol because of bradycardia and this then was transitioned to      Norvasc 10 mg p.o. daily.  The patient was told to stop metoprolol      because of bradycardia.  Throughout his  hospitalization, the      patient's blood pressure was acceptable and at goal.   DISCHARGE INSTRUCTIONS:  The patient is to have a low-sodium, heart-  healthy diet.  The patient is to follow activity as according to  physical therapy and occupational therapy recommendations.   FOLLOWUP APPOINTMENTS:  The patient is to return to [facility] at phone  number [phone].  The patient or family is to call and schedule an  appointment in the next week or two.   DISCHARGE CONDITION:  The patient was discharged home in stable medical  condition.  The patient was initially scheduled to go to a skilled  nursing facility according to physical therapy and occupational therapy.  However, the patient's brother insisted that the patient would do better  at home and insisted that the patient return home with them.  We  arranged for home health nurse to help take care of the patient as well  as home health PT and OT.      Mylinda Latina, MD  Electronically Signed      Dalbert Mayotte, MD  Electronically Signed    WS/MEDQ  D:  08/25/2008  T:  08/26/2008  Job:  [id number]

## 2011-08-29 LAB — CBC
HCT: 41.5
HCT: 42
HCT: 43.2
Hemoglobin: 13.9
Hemoglobin: 13.9
Hemoglobin: 14.4
Hemoglobin: 15
MCHC: 33
MCHC: 33.2
MCHC: 33.4
MCV: 90.6
MCV: 91.2
Platelets: 261
RBC: 4.36
RBC: 4.63
RBC: 4.77
RBC: 4.96
RDW: 14.8
RDW: 15
RDW: 15

## 2011-08-29 LAB — BASIC METABOLIC PANEL
BUN: 12
BUN: 13
CO2: 24
CO2: 24
CO2: 25
CO2: 25
CO2: 27
Calcium: 13.5
Calcium: 13.6
Calcium: 14
Calcium: 14
Calcium: 14.1
Calcium: 14.4
Calcium: 14.5
Chloride: 107
Chloride: 107
Creatinine, Ser: 1.47
Creatinine, Ser: 1.58 — ABNORMAL HIGH
Creatinine, Ser: 1.59 — ABNORMAL HIGH
Creatinine, Ser: 1.72 — ABNORMAL HIGH
GFR calc Af Amer: 44 — ABNORMAL LOW
GFR calc Af Amer: 49 — ABNORMAL LOW
GFR calc Af Amer: 49 — ABNORMAL LOW
GFR calc Af Amer: 49 — ABNORMAL LOW
GFR calc Af Amer: 54 — ABNORMAL LOW
GFR calc Af Amer: 54 — ABNORMAL LOW
GFR calc Af Amer: 59 — ABNORMAL LOW
GFR calc non Af Amer: 41 — ABNORMAL LOW
GFR calc non Af Amer: 41 — ABNORMAL LOW
GFR calc non Af Amer: 41 — ABNORMAL LOW
GFR calc non Af Amer: 44 — ABNORMAL LOW
GFR calc non Af Amer: 45 — ABNORMAL LOW
Glucose, Bld: 120 — ABNORMAL HIGH
Glucose, Bld: 84
Potassium: 3.5
Potassium: 3.8
Potassium: 3.9
Sodium: 138
Sodium: 139
Sodium: 139
Sodium: 140
Sodium: 140
Sodium: 141

## 2011-08-29 LAB — COMPREHENSIVE METABOLIC PANEL
ALT: <8
AST: 19
Albumin: 3.1 — ABNORMAL LOW
Alkaline Phosphatase: 100
BUN: 14
CO2: 22
Calcium: 14.1
Creatinine, Ser: 1.36
GFR calc Af Amer: >60
GFR calc non Af Amer: 52 — ABNORMAL LOW
Glucose, Bld: 113 — ABNORMAL HIGH
Glucose, Bld: 94
Potassium: 4.9
Sodium: 140
Total Protein: 7
Total Protein: 7.3

## 2011-08-29 LAB — URINE MICROSCOPIC-ADD ON

## 2011-08-29 LAB — SODIUM, URINE, RANDOM: Sodium, Ur: 45

## 2011-08-29 LAB — URINALYSIS, ROUTINE W REFLEX MICROSCOPIC
Bilirubin Urine: NEGATIVE
Glucose, UA: NEGATIVE
Glucose, UA: NEGATIVE
Glucose, UA: NEGATIVE
Hgb urine dipstick: NEGATIVE
Ketones, ur: NEGATIVE
Protein, ur: 30 — AB
Protein, ur: NEGATIVE
Specific Gravity, Urine: 1.014
pH: 7

## 2011-08-29 LAB — URINE CULTURE
Colony Count: >=100000
Colony Count: >=100000

## 2011-08-29 LAB — LIPASE, BLOOD
Lipase: 521 — ABNORMAL HIGH
Lipase: 80 — ABNORMAL HIGH
Lipase: 90 — ABNORMAL HIGH

## 2011-08-29 LAB — ALBUMIN: Albumin: 3.1 — ABNORMAL LOW

## 2011-08-29 LAB — CREATININE, URINE, RANDOM: Creatinine, Urine: 153.5

## 2011-08-29 LAB — GRAM STAIN

## 2011-08-31 LAB — AMMONIA: Ammonia: 29

## 2011-08-31 LAB — URINE CULTURE: Colony Count: >=100000

## 2011-08-31 LAB — AMYLASE: Amylase: 2526 — ABNORMAL HIGH

## 2011-08-31 LAB — DIFFERENTIAL
Basophils Absolute: 0
Basophils Relative: 0
Eosinophils Relative: 0
Monocytes Absolute: 0.5
Monocytes Relative: 5

## 2011-08-31 LAB — COMPREHENSIVE METABOLIC PANEL
AST: 17
Albumin: 3.7
Alkaline Phosphatase: 108
Chloride: 108
GFR calc Af Amer: 52 — ABNORMAL LOW
Potassium: 4.1
Sodium: 139
Total Bilirubin: 1

## 2011-08-31 LAB — APTT: aPTT: 30

## 2011-08-31 LAB — URINALYSIS, ROUTINE W REFLEX MICROSCOPIC
Bilirubin Urine: NEGATIVE
Nitrite: POSITIVE — AB
Specific Gravity, Urine: 1.018
pH: 5.5

## 2011-08-31 LAB — CBC
Platelets: 298
WBC: 9.9

## 2011-08-31 LAB — ETHANOL: Alcohol, Ethyl (B): <5

## 2011-08-31 LAB — URINE MICROSCOPIC-ADD ON

## 2011-08-31 LAB — POCT CARDIAC MARKERS: Myoglobin, poc: 269

## 2011-08-31 LAB — RAPID URINE DRUG SCREEN, HOSP PERFORMED
Amphetamines: NOT DETECTED
Barbiturates: NOT DETECTED
Benzodiazepines: NOT DETECTED

## 2011-09-07 LAB — TYPE AND SCREEN
ABO/RH(D): A POS
Antibody Screen: NEGATIVE
DAT, IgG: NEGATIVE

## 2011-09-07 LAB — BASIC METABOLIC PANEL
BUN: 9
Creatinine, Ser: 1.56 — ABNORMAL HIGH
GFR calc non Af Amer: 46 — ABNORMAL LOW
Glucose, Bld: 138 — ABNORMAL HIGH

## 2011-09-07 LAB — CBC
HCT: 33.4 — ABNORMAL LOW
HCT: 39.6
Hemoglobin: 13.2
MCV: 87.6
Platelets: 444 — ABNORMAL HIGH
RBC: 4.52
RDW: 14.8 — ABNORMAL HIGH
WBC: 7.5
WBC: 8.9

## 2011-09-07 LAB — DIFFERENTIAL
Basophils Absolute: 0
Eosinophils Absolute: 0.2
Eosinophils Relative: 2
Lymphocytes Relative: 19
Lymphs Abs: 1.7
Neutrophils Relative %: 73

## 2011-09-07 LAB — APTT: aPTT: 30

## 2011-09-07 LAB — ABO/RH: ABO/RH(D): A POS

## 2011-09-08 LAB — BASIC METABOLIC PANEL
BUN: 27 — ABNORMAL HIGH
BUN: 32 — ABNORMAL HIGH
CO2: 23
CO2: 23
CO2: 24
Calcium: 10.1
Calcium: 8.4
Calcium: 8.9
Calcium: 9.9
Creatinine, Ser: 1.43
Creatinine, Ser: 1.49
Creatinine, Ser: 1.61 — ABNORMAL HIGH
GFR calc Af Amer: 58 — ABNORMAL LOW
GFR calc Af Amer: >60
GFR calc non Af Amer: 50 — ABNORMAL LOW
GFR calc non Af Amer: 55 — ABNORMAL LOW
Glucose, Bld: 183 — ABNORMAL HIGH
Glucose, Bld: 81
Potassium: 4
Sodium: 139

## 2011-09-08 LAB — COMPREHENSIVE METABOLIC PANEL
ALT: 10
ALT: 11
ALT: 12
ALT: <8
AST: 13
AST: 14
AST: 20
AST: 20
Albumin: 2.4 — ABNORMAL LOW
Albumin: 3.1 — ABNORMAL LOW
Albumin: 3.2 — ABNORMAL LOW
Alkaline Phosphatase: 62
Alkaline Phosphatase: 69
Alkaline Phosphatase: 71
Alkaline Phosphatase: 82
Alkaline Phosphatase: 90
BUN: 14
BUN: 35 — ABNORMAL HIGH
CO2: 21
CO2: 22
CO2: 23
CO2: 26
Calcium: 10.9 — ABNORMAL HIGH
Calcium: 9.6
Chloride: 101
Chloride: 101
Chloride: 107
Chloride: 108
Chloride: 109
Chloride: 110
Creatinine, Ser: 1.36
Creatinine, Ser: 2.02 — ABNORMAL HIGH
GFR calc Af Amer: 41 — ABNORMAL LOW
GFR calc Af Amer: 49 — ABNORMAL LOW
GFR calc Af Amer: >60
GFR calc Af Amer: >60
GFR calc non Af Amer: 34 — ABNORMAL LOW
GFR calc non Af Amer: 53 — ABNORMAL LOW
Glucose, Bld: 149 — ABNORMAL HIGH
Glucose, Bld: 165 — ABNORMAL HIGH
Potassium: 2.9 — ABNORMAL LOW
Potassium: 3.6
Potassium: 3.7
Potassium: 4.7
Sodium: 132 — ABNORMAL LOW
Sodium: 135
Sodium: 135
Sodium: 140
Sodium: 142
Total Bilirubin: 0.4
Total Bilirubin: 0.4
Total Bilirubin: 0.4
Total Bilirubin: 0.5
Total Bilirubin: 0.6
Total Bilirubin: 0.7
Total Protein: 5.9 — ABNORMAL LOW
Total Protein: 6.1
Total Protein: 6.7
Total Protein: 7.6

## 2011-09-08 LAB — STONE ANALYSIS: Stone Weight KSTONE: 1194.3 mg

## 2011-09-08 LAB — CBC
HCT: 41.2
HCT: 41.8
HCT: 44
HCT: 47
Hemoglobin: 13.4
Hemoglobin: 13.5
Hemoglobin: 13.7
Hemoglobin: 13.8
MCHC: 33
MCHC: 33.3
MCHC: 33.6
MCHC: 33.8
MCV: 89.2
MCV: 89.8
MCV: 91.6
Platelets: 310
Platelets: 338
Platelets: 423 — ABNORMAL HIGH
Platelets: 427 — ABNORMAL HIGH
RBC: 4.41
RBC: 4.52
RBC: 4.54
RBC: 4.62
RDW: 15.6 — ABNORMAL HIGH
RDW: 15.6 — ABNORMAL HIGH
RDW: 15.6 — ABNORMAL HIGH
RDW: 15.9 — ABNORMAL HIGH
RDW: 15.9 — ABNORMAL HIGH
RDW: 16 — ABNORMAL HIGH
WBC: 4.4
WBC: 4.8
WBC: 5.7
WBC: 6.2
WBC: 6.4

## 2011-09-08 LAB — APTT: aPTT: 31

## 2011-09-08 LAB — URINALYSIS, ROUTINE W REFLEX MICROSCOPIC
Bilirubin Urine: NEGATIVE
Nitrite: NEGATIVE
Specific Gravity, Urine: 1.013
Urobilinogen, UA: 0.2
pH: 6.5

## 2011-09-08 LAB — DIFFERENTIAL
Basophils Absolute: 0
Basophils Absolute: 0.1
Basophils Relative: 0
Basophils Relative: 1
Eosinophils Absolute: 0.1
Eosinophils Absolute: 0.2
Eosinophils Relative: 2
Eosinophils Relative: 3
Lymphocytes Relative: 19
Lymphocytes Relative: 21
Lymphs Abs: 1.2
Lymphs Abs: 1.6
Monocytes Absolute: 0.5
Monocytes Absolute: 0.8 — ABNORMAL HIGH
Monocytes Relative: 7
Monocytes Relative: 8
Neutro Abs: 5.1
Neutro Abs: 5.4
Neutrophils Relative %: 73
Neutrophils Relative %: 78 — ABNORMAL HIGH

## 2011-09-08 LAB — MAGNESIUM
Magnesium: 1.8
Magnesium: 2.1

## 2011-09-08 LAB — PHOSPHORUS
Phosphorus: 1.8 — ABNORMAL LOW
Phosphorus: 2.8

## 2011-09-08 LAB — RENAL FUNCTION PANEL
CO2: 23
Chloride: 111
GFR calc Af Amer: >60
GFR calc non Af Amer: 59 — ABNORMAL LOW
Glucose, Bld: 125 — ABNORMAL HIGH
Sodium: 138

## 2011-09-08 LAB — URINE MICROSCOPIC-ADD ON

## 2011-09-08 LAB — PREALBUMIN
Prealbumin: 12 — ABNORMAL LOW
Prealbumin: 12.8 — ABNORMAL LOW

## 2011-09-08 LAB — CHOLESTEROL, TOTAL: Cholesterol: 130

## 2011-09-08 LAB — TRIGLYCERIDES: Triglycerides: 129

## 2011-09-08 LAB — URINE CULTURE: Colony Count: NO GROWTH

## 2011-09-08 LAB — PROTIME-INR: INR: 1.2

## 2011-09-09 LAB — BASIC METABOLIC PANEL
CO2: 27
Calcium: 12.6 — ABNORMAL HIGH
Chloride: 108
Glucose, Bld: 116 — ABNORMAL HIGH
Sodium: 140

## 2011-09-09 LAB — URINE MICROSCOPIC-ADD ON

## 2011-09-09 LAB — DRUGS OF ABUSE SCREEN W/O ALC, ROUTINE URINE
Barbiturate Quant, Ur: NEGATIVE
Creatinine,U: 55.7
Marijuana Metabolite: NEGATIVE
Methadone: NEGATIVE
Opiate Screen, Urine: NEGATIVE

## 2011-09-09 LAB — RENAL FUNCTION PANEL
Albumin: 2.8 — ABNORMAL LOW
Albumin: 3.7
BUN: 9
CO2: 24
CO2: 31
Calcium: 13.5
Chloride: 110
Creatinine, Ser: 1.31
Creatinine, Ser: 1.57 — ABNORMAL HIGH
GFR calc Af Amer: 55 — ABNORMAL LOW
GFR calc Af Amer: >60
GFR calc non Af Amer: 45 — ABNORMAL LOW
GFR calc non Af Amer: 56 — ABNORMAL LOW
Phosphorus: 1.4 — ABNORMAL LOW
Potassium: 3.5
Sodium: 138

## 2011-09-09 LAB — CREATININE, URINE, RANDOM
Creatinine, Urine: 192.9
Creatinine, Urine: 194.5

## 2011-09-09 LAB — URINALYSIS, ROUTINE W REFLEX MICROSCOPIC
Glucose, UA: NEGATIVE
Nitrite: NEGATIVE
Specific Gravity, Urine: 1.016
pH: 5.5

## 2011-09-09 LAB — DIFFERENTIAL
Basophils Absolute: 0
Basophils Relative: 0
Eosinophils Absolute: 0
Eosinophils Absolute: 0.2
Eosinophils Relative: 0
Eosinophils Relative: 2
Lymphocytes Relative: 17
Lymphs Abs: 1.4
Monocytes Absolute: 0.4
Monocytes Relative: 5
Monocytes Relative: 9

## 2011-09-09 LAB — CBC
HCT: 40.7
HCT: 48.8
Hemoglobin: 15.5
Hemoglobin: 16.3
MCV: 89.8
Platelets: 339
RBC: 4.53
RBC: 5.15
RDW: 16.1 — ABNORMAL HIGH
WBC: 7.8
WBC: 8.7
WBC: 9.9

## 2011-09-09 LAB — OCCULT BLOOD X 1 CARD TO LAB, STOOL: Fecal Occult Bld: NEGATIVE

## 2011-09-09 LAB — COMPREHENSIVE METABOLIC PANEL
ALT: 12
Albumin: 3.8
Alkaline Phosphatase: 109
BUN: 13
Chloride: 104
Potassium: 3.5
Sodium: 138
Total Bilirubin: 1
Total Protein: 7.9

## 2011-09-09 LAB — HIV ANTIBODY (ROUTINE TESTING W REFLEX): HIV: NONREACTIVE

## 2011-09-09 LAB — CARDIAC PANEL(CRET KIN+CKTOT+MB+TROPI)
CK, MB: 1.1
CK, MB: 1.3
CK, MB: 1.7
Total CK: 52
Troponin I: 0.02

## 2011-09-09 LAB — GC/CHLAMYDIA PROBE AMP, URINE
Chlamydia, Swab/Urine, PCR: NEGATIVE
GC Probe Amp, Urine: NEGATIVE

## 2011-09-09 LAB — MAGNESIUM: Magnesium: 1.3 — ABNORMAL LOW

## 2011-09-09 LAB — LIPID PANEL
Cholesterol: 135
HDL: 46
LDL Cholesterol: 79
Total CHOL/HDL Ratio: 2.9
Triglycerides: 49

## 2011-09-09 LAB — PROTEIN ELECTROPH W RFLX QUANT IMMUNOGLOBULINS
Alpha-2-Globulin: 11.5
Beta 2: 8 — ABNORMAL HIGH
Beta Globulin: 5.8
M-Spike, %: NOT DETECTED
Total Protein ELP: 8.2

## 2011-09-09 LAB — LIPASE, BLOOD: Lipase: 575 — ABNORMAL HIGH

## 2011-09-09 LAB — CORTISOL: Cortisol, Plasma: 16.7

## 2011-09-09 LAB — URINE CULTURE: Colony Count: 10000

## 2011-09-09 LAB — VITAMIN D 1,25 DIHYDROXY: Vit D, 1,25-Dihydroxy: 65 pg/mL — ABNORMAL HIGH (ref 6–62)

## 2011-09-09 LAB — RPR: RPR Ser Ql: NONREACTIVE

## 2011-09-09 LAB — TSH: TSH: 0.886

## 2011-09-09 LAB — SODIUM, URINE, RANDOM: Sodium, Ur: 50

## 2011-11-09 ENCOUNTER — Encounter: Payer: Self-pay | Admitting: Emergency Medicine

## 2011-11-09 ENCOUNTER — Emergency Department (HOSPITAL_COMMUNITY): Payer: Medicaid Other

## 2011-11-09 ENCOUNTER — Inpatient Hospital Stay (HOSPITAL_COMMUNITY)
Admission: EM | Admit: 2011-11-09 | Discharge: 2011-11-14 | DRG: 644 | Disposition: A | Discharge status: Home / Self Care | Payer: Medicaid Other | Source: Ambulatory Visit | Attending: Internal Medicine | Admitting: Internal Medicine

## 2011-11-09 DIAGNOSIS — E210 Primary hyperparathyroidism: Principal | ICD-10-CM | POA: Diagnosis present

## 2011-11-09 DIAGNOSIS — F1020 Alcohol dependence, uncomplicated: Secondary | ICD-10-CM | POA: Diagnosis present

## 2011-11-09 DIAGNOSIS — R269 Unspecified abnormalities of gait and mobility: Secondary | ICD-10-CM | POA: Diagnosis present

## 2011-11-09 DIAGNOSIS — I129 Hypertensive chronic kidney disease with stage 1 through stage 4 chronic kidney disease, or unspecified chronic kidney disease: Secondary | ICD-10-CM | POA: Diagnosis present

## 2011-11-09 DIAGNOSIS — Z87442 Personal history of urinary calculi: Secondary | ICD-10-CM | POA: Insufficient documentation

## 2011-11-09 DIAGNOSIS — N251 Nephrogenic diabetes insipidus: Secondary | ICD-10-CM | POA: Insufficient documentation

## 2011-11-09 DIAGNOSIS — I10 Essential (primary) hypertension: Secondary | ICD-10-CM | POA: Diagnosis present

## 2011-11-09 DIAGNOSIS — R4182 Altered mental status, unspecified: Secondary | ICD-10-CM | POA: Diagnosis present

## 2011-11-09 DIAGNOSIS — N189 Chronic kidney disease, unspecified: Secondary | ICD-10-CM | POA: Diagnosis present

## 2011-11-09 DIAGNOSIS — B952 Enterococcus as the cause of diseases classified elsewhere: Secondary | ICD-10-CM | POA: Diagnosis present

## 2011-11-09 DIAGNOSIS — F17200 Nicotine dependence, unspecified, uncomplicated: Secondary | ICD-10-CM | POA: Diagnosis present

## 2011-11-09 DIAGNOSIS — N390 Urinary tract infection, site not specified: Secondary | ICD-10-CM | POA: Diagnosis present

## 2011-11-09 DIAGNOSIS — F1010 Alcohol abuse, uncomplicated: Secondary | ICD-10-CM | POA: Diagnosis present

## 2011-11-09 DIAGNOSIS — F209 Schizophrenia, unspecified: Secondary | ICD-10-CM | POA: Diagnosis present

## 2011-11-09 DIAGNOSIS — D351 Benign neoplasm of parathyroid gland: Secondary | ICD-10-CM | POA: Diagnosis present

## 2011-11-09 DIAGNOSIS — Z23 Encounter for immunization: Secondary | ICD-10-CM

## 2011-11-09 DIAGNOSIS — R4789 Other speech disturbances: Secondary | ICD-10-CM | POA: Diagnosis present

## 2011-11-09 DIAGNOSIS — K859 Acute pancreatitis without necrosis or infection, unspecified: Secondary | ICD-10-CM | POA: Insufficient documentation

## 2011-11-09 DIAGNOSIS — N179 Acute kidney failure, unspecified: Secondary | ICD-10-CM | POA: Diagnosis present

## 2011-11-09 HISTORY — DX: Tobacco use: Z72.0

## 2011-11-09 HISTORY — DX: Essential (primary) hypertension: I10

## 2011-11-09 HISTORY — DX: Hypercalcemia: E83.52

## 2011-11-09 HISTORY — DX: Chronic kidney disease, unspecified: N18.9

## 2011-11-09 HISTORY — DX: Primary hyperparathyroidism: E21.0

## 2011-11-09 HISTORY — DX: Alcohol dependence, uncomplicated: F10.20

## 2011-11-09 HISTORY — DX: Unspecified displaced fracture of surgical neck of unspecified humerus, initial encounter for closed fracture: S42.213A

## 2011-11-09 HISTORY — DX: Altered mental status, unspecified: R41.82

## 2011-11-09 HISTORY — DX: Acute pancreatitis without necrosis or infection, unspecified: K85.90

## 2011-11-09 HISTORY — DX: Personal history of urinary calculi: Z87.442

## 2011-11-09 HISTORY — DX: Schizophrenia, unspecified: F20.9

## 2011-11-09 LAB — PROTIME-INR
INR: 1.03 (ref 0.00–1.49)
Prothrombin Time: 13.7 seconds (ref 11.6–15.2)

## 2011-11-09 LAB — COMPREHENSIVE METABOLIC PANEL
Albumin: 3.4 g/dL — ABNORMAL LOW (ref 3.5–5.2)
BUN: 37 mg/dL — ABNORMAL HIGH (ref 6–23)
Creatinine, Ser: 3.67 mg/dL — ABNORMAL HIGH (ref 0.50–1.35)
GFR calc Af Amer: 19 mL/min — ABNORMAL LOW (ref >90)
Glucose, Bld: 129 mg/dL — ABNORMAL HIGH (ref 70–99)
Total Protein: 7.7 g/dL (ref 6.0–8.3)

## 2011-11-09 LAB — URINALYSIS, ROUTINE W REFLEX MICROSCOPIC
Bilirubin Urine: NEGATIVE
Nitrite: NEGATIVE
Specific Gravity, Urine: 1.01 (ref 1.005–1.030)
Urobilinogen, UA: 0.2 mg/dL (ref 0.0–1.0)
pH: 6.5 (ref 5.0–8.0)

## 2011-11-09 LAB — URINE MICROSCOPIC-ADD ON

## 2011-11-09 LAB — PHOSPHORUS: Phosphorus: 2.9 mg/dL (ref 2.3–4.6)

## 2011-11-09 LAB — BASIC METABOLIC PANEL
CO2: 23 mEq/L (ref 19–32)
CO2: 23 mEq/L (ref 19–32)
Calcium: 14.9 mg/dL (ref 8.4–10.5)
Chloride: 110 mEq/L (ref 96–112)
Creatinine, Ser: 3.51 mg/dL — ABNORMAL HIGH (ref 0.50–1.35)
GFR calc Af Amer: 22 mL/min — ABNORMAL LOW (ref >90)
GFR calc non Af Amer: 19 mL/min — ABNORMAL LOW (ref >90)
Potassium: 3.6 mEq/L (ref 3.5–5.1)
Sodium: 142 mEq/L (ref 135–145)

## 2011-11-09 LAB — RAPID URINE DRUG SCREEN, HOSP PERFORMED
Barbiturates: NOT DETECTED
Benzodiazepines: NOT DETECTED
Cocaine: NOT DETECTED
Opiates: NOT DETECTED

## 2011-11-09 LAB — TROPONIN I: Troponin I: <0.3 ng/mL (ref <0.30)

## 2011-11-09 LAB — CBC
HCT: 34.8 % — ABNORMAL LOW (ref 39.0–52.0)
Hemoglobin: 11.2 g/dL — ABNORMAL LOW (ref 13.0–17.0)
MCH: 28.5 pg (ref 26.0–34.0)
MCHC: 32.2 g/dL (ref 30.0–36.0)
MCV: 88.5 fL (ref 78.0–100.0)

## 2011-11-09 LAB — DIFFERENTIAL
Basophils Relative: 0 % (ref 0–1)
Eosinophils Absolute: 0.1 10*3/uL (ref 0.0–0.7)
Eosinophils Relative: 1 % (ref 0–5)
Monocytes Absolute: 0.4 10*3/uL (ref 0.1–1.0)
Monocytes Relative: 7 % (ref 3–12)

## 2011-11-09 LAB — CK TOTAL AND CKMB (NOT AT ARMC)
CK, MB: 2.8 ng/mL (ref 0.3–4.0)
Total CK: 119 U/L (ref 7–232)

## 2011-11-09 LAB — TSH: TSH: 1.237 u[IU]/mL (ref 0.350–4.500)

## 2011-11-09 LAB — MRSA PCR SCREENING: MRSA by PCR: NEGATIVE

## 2011-11-09 LAB — GLUCOSE, CAPILLARY: Glucose-Capillary: 110 mg/dL — ABNORMAL HIGH (ref 70–99)

## 2011-11-09 LAB — CALCIUM, IONIZED: Calcium, Ion: 2.21 mmol/L (ref 1.12–1.32)

## 2011-11-09 LAB — APTT: aPTT: <20 seconds — ABNORMAL LOW (ref 24–37)

## 2011-11-09 LAB — MAGNESIUM: Magnesium: 2 mg/dL (ref 1.5–2.5)

## 2011-11-09 LAB — POCT I-STAT TROPONIN I

## 2011-11-09 MED ORDER — ACETAMINOPHEN 650 MG RE SUPP: 650.0000 mg | Freq: Four times a day (QID) | RECTAL | Status: DC | PRN

## 2011-11-09 MED ORDER — SODIUM CHLORIDE 0.9 % IV BOLUS (SEPSIS)
1000.0000 mL | Freq: Once | INTRAVENOUS | Status: AC
  Administered 2011-11-09 (×2): 1000 mL via INTRAVENOUS

## 2011-11-09 MED ORDER — CALCITONIN (SALMON) 200 UNIT/ML IJ SOLN
300.0000 [IU] | Freq: Once | INTRAMUSCULAR | Status: AC
  Administered 2011-11-09: 300 [IU] via INTRAMUSCULAR
  Filled 2011-11-09: qty 1.5

## 2011-11-09 MED ORDER — DEXTROSE 5 % IV SOLN
1.0000 g | Freq: Once | INTRAVENOUS | Status: AC
  Administered 2011-11-09: 1 g via INTRAVENOUS
  Filled 2011-11-09: qty 10

## 2011-11-09 MED ORDER — HYDRALAZINE HCL 20 MG/ML IJ SOLN
10.0000 mg | Freq: Four times a day (QID) | INTRAMUSCULAR | Status: DC | PRN
  Administered 2011-11-10: 10 mg via INTRAVENOUS
  Filled 2011-11-09 (×2): qty 0.5

## 2011-11-09 MED ORDER — HEPARIN SODIUM (PORCINE) 5000 UNIT/ML IJ SOLN
5000.0000 [IU] | Freq: Three times a day (TID) | INTRAMUSCULAR | Status: DC
  Administered 2011-11-09 – 2011-11-14 (×15): 5000 [IU] via SUBCUTANEOUS
  Filled 2011-11-09 (×17): qty 1

## 2011-11-09 MED ORDER — SODIUM CHLORIDE 0.9 % IV SOLN
90.0000 mg | INTRAVENOUS | Status: AC
  Administered 2011-11-09: 90 mg via INTRAVENOUS
  Filled 2011-11-09: qty 500

## 2011-11-09 MED ORDER — CALCITONIN (SALMON) 200 UNIT/ML IJ SOLN
100.0000 [IU] | Freq: Once | INTRAMUSCULAR | Status: AC
  Administered 2011-11-09: 100 [IU] via INTRAMUSCULAR
  Filled 2011-11-09: qty 0.5

## 2011-11-09 MED ORDER — CALCITONIN (SALMON) 200 UNIT/ML IJ SOLN: 100.0000 [IU] | Freq: Once | INTRAMUSCULAR | Status: DC

## 2011-11-09 MED ORDER — SODIUM CHLORIDE 0.9 % IV SOLN
Freq: Once | INTRAVENOUS | Status: AC
  Administered 2011-11-09: 10:00:00 via INTRAVENOUS

## 2011-11-09 MED ORDER — ENOXAPARIN SODIUM 30 MG/0.3ML ~~LOC~~ SOLN: 30.0000 mg | SUBCUTANEOUS | Status: DC

## 2011-11-09 MED ORDER — LORAZEPAM 2 MG/ML IJ SOLN
0.0000 mg | Freq: Four times a day (QID) | INTRAMUSCULAR | Status: DC
  Administered 2011-11-09 – 2011-11-10 (×2): 0.4 mg via INTRAVENOUS
  Administered 2011-11-10: 0.5 mg via INTRAVENOUS
  Administered 2011-11-10: 0.4 mg via INTRAVENOUS
  Administered 2011-11-11: 1 mg via INTRAVENOUS
  Filled 2011-11-09 (×4): qty 1

## 2011-11-09 MED ORDER — SODIUM CHLORIDE 0.9 % IV SOLN
INTRAVENOUS | Status: DC
  Administered 2011-11-09: 23:00:00 via INTRAVENOUS
  Administered 2011-11-09: 200 mL/h via INTRAVENOUS
  Administered 2011-11-10 (×2): via INTRAVENOUS

## 2011-11-09 MED ORDER — LORAZEPAM 0.5 MG PO TABS: 1.0000 mg | ORAL_TABLET | Freq: Four times a day (QID) | ORAL | Status: DC | PRN

## 2011-11-09 MED ORDER — LORAZEPAM 2 MG/ML IJ SOLN
0.0000 mg | Freq: Two times a day (BID) | INTRAMUSCULAR | Status: DC
  Filled 2011-11-09: qty 1

## 2011-11-09 MED ORDER — ONDANSETRON HCL 4 MG/2ML IJ SOLN
4.0000 mg | Freq: Four times a day (QID) | INTRAMUSCULAR | Status: DC | PRN
  Administered 2011-11-10: 4 mg via INTRAVENOUS
  Filled 2011-11-09: qty 2

## 2011-11-09 MED ORDER — DEXTROSE 5 % IV SOLN
1.0000 g | INTRAVENOUS | Status: DC
  Administered 2011-11-10 – 2011-11-12 (×3): 1 g via INTRAVENOUS
  Filled 2011-11-09 (×3): qty 10

## 2011-11-09 MED ORDER — LORAZEPAM 2 MG/ML IJ SOLN: 1.0000 mg | Freq: Four times a day (QID) | INTRAMUSCULAR | Status: DC | PRN

## 2011-11-09 MED ORDER — FUROSEMIDE 10 MG/ML IJ SOLN
80.0000 mg | Freq: Once | INTRAMUSCULAR | Status: AC
  Administered 2011-11-09: 80 mg via INTRAVENOUS
  Filled 2011-11-09: qty 8

## 2011-11-09 NOTE — ED Provider Notes (Signed)
A 41 a.m. patient's somnolent arousable on verbal stimulus speech is slurred moves all extremities does not follow some commands. Reflexes intact lungs clear to auscultation Patient felt to be encephalopathic Lab laboratory data reviewed. Internal medicine resident physician called to evaluate patient for admission. Results for orders placed during the hospital encounter of 11/09/11  PROTIME-INR      Component Value Range   Prothrombin Time 13.7  11.6 - 15.2 (seconds)   INR 1.03  0.00 - 1.49   APTT      Component Value Range   aPTT <20 (*) 24 - 37 (seconds)  CBC      Component Value Range   WBC 5.6  4.0 - 10.5 (K/uL)   RBC 3.93 (*) 4.22 - 5.81 (MIL/uL)   Hemoglobin 11.2 (*) 13.0 - 17.0 (g/dL)   HCT 34.8 (*) 39.0 - 52.0 (%)   MCV 88.5  78.0 - 100.0 (fL)   MCH 28.5  26.0 - 34.0 (pg)   MCHC 32.2  30.0 - 36.0 (g/dL)   RDW 14.1  11.5 - 15.5 (%)   Platelets 342  150 - 400 (K/uL)  DIFFERENTIAL      Component Value Range   Neutrophils Relative 73  43 - 77 (%)   Neutro Abs 4.1  1.7 - 7.7 (K/uL)   Lymphocytes Relative 19  12 - 46 (%)   Lymphs Abs 1.0  0.7 - 4.0 (K/uL)   Monocytes Relative 7  3 - 12 (%)   Monocytes Absolute 0.4  0.1 - 1.0 (K/uL)   Eosinophils Relative 1  0 - 5 (%)   Eosinophils Absolute 0.1  0.0 - 0.7 (K/uL)   Basophils Relative 0  0 - 1 (%)   Basophils Absolute 0.0  0.0 - 0.1 (K/uL)  COMPREHENSIVE METABOLIC PANEL      Component Value Range   Sodium 140  135 - 145 (mEq/L)   Potassium 4.0  3.5 - 5.1 (mEq/L)   Chloride 109  96 - 112 (mEq/L)   CO2 22  19 - 32 (mEq/L)   Glucose, Bld 129 (*) 70 - 99 (mg/dL)   BUN 37 (*) 6 - 23 (mg/dL)   Creatinine, Ser 3.67 (*) 0.50 - 1.35 (mg/dL)   Calcium >15.0 (*) 8.4 - 10.5 (mg/dL)   Total Protein 7.7  6.0 - 8.3 (g/dL)   Albumin 3.4 (*) 3.5 - 5.2 (g/dL)   AST 17  0 - 37 (U/L)   ALT 7  0 - 53 (U/L)   Alkaline Phosphatase 121 (*) 39 - 117 (U/L)   Total Bilirubin 0.2 (*) 0.3 - 1.2 (mg/dL)   GFR calc non Af Amer 16 (*) >90 (mL/min)    GFR calc Af Amer 19 (*) >90 (mL/min)  CK TOTAL AND CKMB      Component Value Range   Total CK 119  7 - 232 (U/L)   CK, MB 2.8  0.3 - 4.0 (ng/mL)   Relative Index 2.4  0.0 - 2.5   TROPONIN I      Component Value Range   Troponin I <0.30  <0.30 (ng/mL)  GLUCOSE, CAPILLARY      Component Value Range   Glucose-Capillary 110 (*) 70 - 99 (mg/dL)  ETHANOL      Component Value Range   Alcohol, Ethyl (B) <11  0 - 11 (mg/dL)  AMMONIA      Component Value Range   Ammonia 31  11 - 60 (umol/L)  POCT I-STAT TROPONIN I  Component Value Range   Troponin i, poc 0.06  0.00 - 0.08 (ng/mL)   Comment 3           URINALYSIS, ROUTINE W REFLEX MICROSCOPIC      Component Value Range   Color, Urine YELLOW  YELLOW    APPearance CLOUDY (*) CLEAR    Specific Gravity, Urine 1.010  1.005 - 1.030    pH 6.5  5.0 - 8.0    Glucose, UA NEGATIVE  NEGATIVE (mg/dL)   Hgb urine dipstick MODERATE (*) NEGATIVE    Bilirubin Urine NEGATIVE  NEGATIVE    Ketones, ur NEGATIVE  NEGATIVE (mg/dL)   Protein, ur NEGATIVE  NEGATIVE (mg/dL)   Urobilinogen, UA 0.2  0.0 - 1.0 (mg/dL)   Nitrite NEGATIVE  NEGATIVE    Leukocytes, UA LARGE (*) NEGATIVE   URINE MICROSCOPIC-ADD ON      Component Value Range   Squamous Epithelial / LPF RARE  RARE    WBC, UA 21-50  <3 (WBC/hpf)   RBC / HPF 11-20  <3 (RBC/hpf)   Bacteria, UA MANY (*) RARE    Ct Head Wo Contrast  11/09/2011  *RADIOLOGY REPORT*  Clinical Data: Unsteady gait.  Left body weakness.  CT HEAD WITHOUT CONTRAST  Technique:  Contiguous axial images were obtained from the base of the skull through the vertex without contrast.  Comparison: 08/22/2010.  Findings: No intracranial hemorrhage.  Prominent white matter type changes.  Posterior superior aspect of the posterior limb of the right internal capsule small acute infarct not excluded.  Mild atrophy without hydrocephalus.  No intracranial mass lesion detected on this unenhanced exam.  Prior orbital surgery with wires and  plate/screw is in place.  IMPRESSION: No intracranial hemorrhage.  Prominent white matter type changes.  Posterior superior aspect of the posterior limb of the right internal capsule small acute infarct not excluded.  Mild atrophy without hydrocephalus.  Prior orbital surgery with wires and plate/screw is in place.  Critical Value/emergent results were called by telephone at the time of interpretation on 11/09/2011   at 6:56 a.m.  to  Dr. Sabra Heck, who verbally acknowledged these results.  Original Report Authenticated By: Doug Sou, M.D.   Dg Chest Port 1 View  11/09/2011  *RADIOLOGY REPORT*  Clinical Data: Shortness of breath.  PORTABLE CHEST - 1 VIEW  Comparison: 08/22/2010 chest x-ray.  09/07/2010 chest CT.  Findings: Tortuous thoracic aorta.  Heart size top normal.  Mild central pulmonary vascular prominence.  Minimal basilar atelectasis/scarring.  No pulmonary edema or segmental infiltrate.  No gross pneumothorax.  IMPRESSION: Mildly tortuous aorta.  Heart size top normal with mild central pulmonary vascular prominence.  Minimal basilar atelectasis/scarring.  Original Report Authenticated By: Doug Sou, M.D.   Plan IV hydration diuresis cardiac monitoring  Diagnosis #1 acute encephalopathy #2 hypercalcemia #3 renal failure CRITICAL CARE Performed by: Orlie Dakin   Total critical care time: 62minute  Critical care time was exclusive of separately billable procedures and treating other patients.  Critical care was necessary to treat or prevent imminent or life-threatening deterioration.  Critical care was time spent personally by me on the following activities: development of treatment plan with patient and/or surrogate as well as nursing, discussions with consultants, evaluation of patient's response to treatment, examination of patient, obtaining history from patient or surrogate, ordering and performing treatments and interventions, ordering and review of laboratory studies,  ordering and review of radiographic studies, pulse oximetry and re-evaluation of patient's condition.  Orlie Dakin, MD 11/09/11  [id number] 

## 2011-11-09 NOTE — ED Notes (Signed)
Patient woke up this am around 0430, went to bathroom, had some incontinence of urine, weakness and having a hard time ambulating with slurred speech.

## 2011-11-09 NOTE — ED Notes (Signed)
Attempted to call report. RN states that she will return call in 10 minutes.

## 2011-11-09 NOTE — Consult Note (Signed)
Referring Physician: Sabra Heck     Chief Complaint: Slurred speech, difficulty with gait  HPI: [name] is an 64 y.o. male who was brought in from home.  History is very difficult to confirm.  Patient reports that he was sleeping and got up to use the bathroom.  By report of EMS the patient was up talking to family members and went to the bathroom.  When he returned he was not walking as well and had slurred speech.  Seemed to be leaning to one side as well.  EMS was called.  Initial NIHSS of 10 but no focality noted on exam.  LSN: [id number] tPA Given: No: No definite stroke symptoms  Past Medical History  Diagnosis Date  . Schizophrenia    - ETOH pancreatitis  - HTN  History reviewed. No pertinent past surgical history.  History reviewed. No pertinent family history. Social History:  does not have a smoking history on file. He does not have any smokeless tobacco history on file. His alcohol and drug histories not on file. Uses a walker at baseline  Allergies: No Known Allergies  Medications: I have reviewed the patient's current medications. Prior to Admission: Trazodone, Desyrel  ROS: Unable to obtain  Physical Examination: Blood pressure 155/98, resp. rate 15, SpO2 100.00%.  Neurologic Examination: Mental Status: Lethargic but oriented.  Speech fluent but dysarthric.  Able to follow 3 step commands but requires reinforcement. Cranial Nerves: II: visual fields grossly normal, pupils equal, round, reactive to light and accommodation III,IV, VI: ptosis not present, extra-ocular motions intact bilaterally V,VII: smile symmetric, facial light touch sensation normal bilaterally VIII: hearing normal bilaterally IX,X: gag reflex present XI: trapezius strength/neck flexion strength normal bilaterally XII: tongue strength normal  Motor: Patient able to lift all extremities against gravity and although he maintains this for a short period of time he does not maintain this against  resistance nor does he maintain it against gravity for a prolonged period of time.  No focality noted.   Tone and bulk:normal tone throughout; no atrophy noted Sensory: Pinprick and light touch intact throughout, bilaterally Deep Tendon Reflexes: 2+ and symmetric throughout with absent AJ's Plantars: Right: downgoing   Left: downgoing Cerebellar: normal finger-to-nose test but does not perform heel-to-shin   Results for orders placed during the hospital encounter of 11/09/11 (from the past 48 hour(s))  GLUCOSE, CAPILLARY     Status: Abnormal   Collection Time   11/09/11  7:00 AM      Component Value Range Comment   Glucose-Capillary 110 (*) 70 - 99 (mg/dL)    Ct Head Wo Contrast  11/09/2011  *RADIOLOGY REPORT*  Clinical Data: Unsteady gait.  Left body weakness.  CT HEAD WITHOUT CONTRAST  Technique:  Contiguous axial images were obtained from the base of the skull through the vertex without contrast.  Comparison: 08/22/2010.  Findings: No intracranial hemorrhage.  Prominent white matter type changes.  Posterior superior aspect of the posterior limb of the right internal capsule small acute infarct not excluded.  Mild atrophy without hydrocephalus.  No intracranial mass lesion detected on this unenhanced exam.  Prior orbital surgery with wires and plate/screw is in place.  IMPRESSION: No intracranial hemorrhage.  Prominent white matter type changes.  Posterior superior aspect of the posterior limb of the right internal capsule small acute infarct not excluded.  Mild atrophy without hydrocephalus.  Prior orbital surgery with wires and plate/screw is in place.  Critical Value/emergent results were called by telephone at the time of  interpretation on 11/09/2011   at 6:56 a.m.  to  Dr. Sabra Heck, who verbally acknowledged these results.  Original Report Authenticated By: Doug Sou, M.D.    Assessment: 65 y.o. male who presents with difficulty with gait and slurred speech.  No focality noted on  exam and reliable history difficulty to obtain.  Questionable acute stroke on imaging.  Further work up indicated.  Stroke Risk Factors - hypertension  Plan: 1. HgbA1c, fasting lipid panel 2. MRI, MRA  of the brain without contrast 3. PT consult, OT consult, Speech consult 4. Echocardiogram 5. Carotid dopplers 6. Prophylactic therapy-Antiplatelet med: Aspirin - dose 325mg  daily 7. Risk factor modification   Alexis Goodell, MD [facility] [phone] 11/09/2011, 7:11 AM

## 2011-11-09 NOTE — ED Notes (Signed)
Code Stroke Encoded: 06:23 Code Stroke Called: 06:23 Pt Arrival: 06:34 EDP Exam: 06:35 Stroke Team Arrival: 06:41 Pt arrival in CT: 06:38 Phleb arrival 06:34 Code Stroke Canceled 07:18

## 2011-11-09 NOTE — ED Notes (Signed)
[id number]  report to  [email or web address]

## 2011-11-09 NOTE — ED Notes (Signed)
Md paged about pt calcium level remaining elevated. Md writing orders for more medication at this time.

## 2011-11-09 NOTE — ED Provider Notes (Signed)
History     CSN: [id number] Arrival date & time: 11/09/2011  6:34 AM   None     Chief Complaint  Patient presents with  . Code Stroke    (Consider location/radiation/quality/duration/timing/severity/associated sxs/prior treatment) HPI Comments: 65 year old male with a history of alcohol abuse, intermittent altered mental status, pancreatitis, hypercalcemia, diabetes insipidus nephrogenic, parathyroid adenoma. He presents by ambulance because of change in mental status. A family member reportedly last saw him approximately 2 hours ago at his baseline and then found him again with slurred speech and difficulty using his left arm, drifting to the left side. Onset of the symptoms was unsure, it is persistent and it was associated with severe hypertension over [id number] systolic according to EMS measurements. Family members are not present on arrival.  The patient is extremely sleepy and somnolent and unable to answer questions about baseline or what happened  The history is provided by the patient and the EMS personnel.    Past Medical History  Diagnosis Date  . Schizophrenia     History reviewed. No pertinent past surgical history.  History reviewed. No pertinent family history.  History  Substance Use Topics  . Smoking status: Not on file  . Smokeless tobacco: Not on file  . Alcohol Use:       Review of Systems  Unable to perform ROS   Allergies  Review of patient's allergies indicates no known allergies.  Home Medications   Current Outpatient Rx  Name Route Sig Dispense Refill  . TRAZODONE HCL 50 MG PO TABS Oral Take 50 mg by mouth at bedtime.      [name] VERAPAMIL HCL PO Oral Take by mouth.        BP 155/98  Resp 15  SpO2 100%  Physical Exam  Nursing note and vitals reviewed. Constitutional: He appears well-developed and well-nourished.       Somnolent  HENT:  Head: Normocephalic and atraumatic.  Mouth/Throat: Oropharynx is clear and moist. No oropharyngeal  exudate.  Eyes: Conjunctivae and EOM are normal. Pupils are equal, round, and reactive to light. Right eye exhibits no discharge. Left eye exhibits no discharge. No scleral icterus.  Neck: Normal range of motion. Neck supple. No JVD present. No thyromegaly present.  Cardiovascular: Normal rate, regular rhythm, normal heart sounds and intact distal pulses.  Exam reveals no gallop and no friction rub.   No murmur heard. Pulmonary/Chest: Effort normal and breath sounds normal. No respiratory distress. He has no wheezes. He has no rales.  Abdominal: Soft. Bowel sounds are normal. He exhibits no distension and no mass. There is no tenderness.  Musculoskeletal: Normal range of motion. He exhibits no edema and no tenderness.  Lymphadenopathy:    He has no cervical adenopathy.  Neurological:       Somnolent, normal finger-nose-finger bilaterally, speech slightly slurred, follows commands slowly, normal extraocular movements, strength - able to lift and bilateral arms and bilateral legs off the bed for between 5 and 10 seconds, sensation intact bilaterally to light touch and pinprick, reflexes at the patellar tendons and brachial radialis normal.    Skin: Skin is warm and dry. No rash noted. No erythema.  Psychiatric: He has a normal mood and affect. His behavior is normal.    ED Course  Procedures (including critical care time)  Labs Reviewed  GLUCOSE, CAPILLARY - Abnormal; Notable for the following:    Glucose-Capillary 110 (*)    All other components within normal limits  PROTIME-INR  APTT  CBC  DIFFERENTIAL  COMPREHENSIVE METABOLIC PANEL  CK TOTAL AND CKMB  TROPONIN I  POCT CBG MONITORING  ETHANOL  AMMONIA   Ct Head Wo Contrast  11/09/2011  *RADIOLOGY REPORT*  Clinical Data: Unsteady gait.  Left body weakness.  CT HEAD WITHOUT CONTRAST  Technique:  Contiguous axial images were obtained from the base of the skull through the vertex without contrast.  Comparison: 08/22/2010.  Findings:  No intracranial hemorrhage.  Prominent white matter type changes.  Posterior superior aspect of the posterior limb of the right internal capsule small acute infarct not excluded.  Mild atrophy without hydrocephalus.  No intracranial mass lesion detected on this unenhanced exam.  Prior orbital surgery with wires and plate/screw is in place.  IMPRESSION: No intracranial hemorrhage.  Prominent white matter type changes.  Posterior superior aspect of the posterior limb of the right internal capsule small acute infarct not excluded.  Mild atrophy without hydrocephalus.  Prior orbital surgery with wires and plate/screw is in place.  Critical Value/emergent results were called by telephone at the time of interpretation on 11/09/2011   at 6:56 a.m.  to  Dr. Sabra Heck, who verbally acknowledged these results.  Original Report Authenticated By: Doug Sou, M.D.     No diagnosis found.    MDM  Patient with somnolence, no focal findings on neurologic exam but has a generalized encephalopathic appearance. Vital signs have improved with a blood pressure of 155/98. Review of his medical records shows a significant history of alcohol abuse and altered mental status also with a history of hypercalcemia. Will start workup with labs, EKG, neurologic consultation  Neurologist has seen the patient and agrees that the patient is nonfocal and would not benefit from thrombolytic therapy at this time.  Dr. Doy Mince requests ED w/u and likely hospitalist admission.  CT scan reveals no acute hemorrhagic stroke according to Radiology Read by Dr. Jeannine Kitten.   ED ECG REPORT   Date: 11/09/2011   Rate: 74  Rhythm: normal sinus rhythm  QRS Axis: left  Intervals: PR prolonged  ST/T Wave abnormalities: nonspecific ST changes  Conduction Disutrbances:first-degree A-V block   Narrative Interpretation:   Old EKG Reviewed: changes noted and Nonspecific ST changes no present compared with EKG from 08/16/2010  Change of shift - Care  signed out to Dr. Winfred Leeds.  Johnna Acosta, MD 11/09/11 [phone]

## 2011-11-09 NOTE — H&P (Signed)
Date: 11/09/2011                  Patient Name:  [name]  MRN: [id number]  DOB: 11-Jul-1946  Age / Sex: 65 y.o., male   PCP: No primary provider on file.                 Medical Service: Internal Medicine Teaching Service                 Attending Physician: Orlie Dakin, MD     First Contact: Dr. Blaine Hamper  Pager: [phone]  Second Contact: Dr. Guy Sandifer  Pager: [phone]              After Hours (After 5pm / weekends / holidays): First Contact  Pager: [phone]   Second Contact  Pager: [phone]     Chief Complaint: Altered mental status  History of Present Illness: Patient is a 65 y.o. male with a PMHx of primary hyperparathyroidism, hypercalcemia, schizophrenia and alcohol abuse, who presents to [facility] for evaluation of somnolence and altered mental status. It appears that patient was brought from home. There is no family present at bedside or when patient presented. History is limited due to altered mental status. Patient does open his eyes but does not currently follow commands or answer questions appropriately. Patient is mumbling and his speech is incoherent. Initially patient presented as a code stroke based on altered mental status and a CT scan that could not exclude a small infarct. Neurology evaluated patient and his patient was not code stroke, but several other studies were ordered including A1C, MRI/MRA, Echo, and dopplers. However, were not done as his CT findings did not correlate to clinical picture, and his global altered sensorium was considered to be secondary to metabolic derangement.   Current Outpatient Medications:  1.) Trazodone 2.) Verapamil   Allergies: No Known Allergies   Past Medical History  Diagnosis Date  . Schizophrenia     previously on zyprexa  . Primary hyperparathyroidism     s/p parathyroid adenoma resection Oct. 8, 2011, repeat scan showed residual right adenoma, however pt did not f/u with surgery as outpt  . Hypercalcemia     2/2 primary hyperparathyroidism  . History of nephrolithiasis 2008    s/p ureterolithotomy   . Pancreatitis 2011    history of several admissions for pancreatitis   . Alcoholism   . Chronic renal insufficiency     with history of acute on chronic secondary to dehydration and hypercalcemia  . Altered mental status 2011    hx of 2/2 hypercalcemia, EtOH w/d and medication overuse  . Hypertension   . Tobacco abuse   . Fx humeral neck 2008    right side, no history of surgical repair   History reviewed. No pertinent family history.  Past Surgical History  Procedure Date  . Parathyroid adenoma resection Oct. 8, 2011    residual adenoma Oct. 12, 2011   History   Social History  . Marital Status: Single   Occupational History  . disabled    Social History Main Topics  . Smoking status: Current Everyday Smoker    Types: Cigarettes  . Smokeless tobacco: Not on file  . Alcohol Use: Yes     history of heavy alcohol use  . Drug Use: Yes     marijuana  . Sexually Active: HIV negative 2011   Social History Narrative   Patient is disabled. Patient lives with brother currently. Patient has  history of being in prison.History of alcoholism      Review of Systems: As per HPI.  Vital Signs: Blood pressure 174/95, pulse 72, temperature 97.1 F (36.2 C), temperature source Rectal, resp. rate 21, SpO2 100.00%.  Physical Exam: General: Alert, opens eyes, NAD  Head: Normocephalic, atraumatic.  Neck: No deformities, masses, or tenderness noted.Supple, No carotid Bruits, no JVD.  Lungs:  Normal respiratory effort. Clear to auscultation BL without crackles or wheezes.  Heart: RRR. S1 and S2 normal without gallop, murmur, or rubs.  Abdomen:  BS normoactive. Soft, Nondistended, non-tender.  No masses or organomegaly.  Extremities: No pretibial edema. DP pulse 2+  Neurologic: Alert but not oriented, speech incoherent, did not follow commands, no obvious focal deficits noted, no facial  droop  Skin: No visible rashes, scars.   Lab results: Basic Metabolic Panel: Recent Labs  [facility] 11/09/11 0710   NA 140   K 4.0   CL 109   CO2 22   GLUCOSE 129*   BUN 37*   CREATININE 3.67*   CALCIUM >15.0*   MG --   PHOS --   Liver Function Tests: Recent Labs  Basename 11/09/11 0710   AST 17   ALT 7   ALKPHOS 121*   BILITOT 0.2*   PROT 7.7   ALBUMIN 3.4*   No results found for this basename: LIPASE:2,AMYLASE:2 in the last 72 hours CBC: Recent Labs  Basename 11/09/11 0710   WBC 5.6   NEUTROABS 4.1   HGB 11.2*   HCT 34.8*   MCV 88.5   PLT 342   Cardiac Enzymes: Recent Labs  Basename 11/09/11 0710   CKTOTAL 119   CKMB 2.8   CKMBINDEX --   TROPONINI <0.30   CBG: Recent Labs  Basename 11/09/11 0700   GLUCAP 110*   Urine Drug Screen: Drugs of Abuse     Component Value Date/Time   LABOPIA NONE DETECTED 11/09/2011 0733   COCAINSCRNUR NONE DETECTED 11/09/2011 0733   LABBENZ NONE DETECTED 11/09/2011 0733   AMPHETMU NONE DETECTED 11/09/2011 0733   THCU NONE DETECTED 11/09/2011 0733   LABBARB NONE DETECTED 11/09/2011 0733     Alcohol Level: Recent Labs  Basename 11/09/11 0710   ETH <11   Urinalysis: Results for [name], [name] (MRN [id number]) as of 11/09/2011 09:52  Ref. Range 11/09/2011 07:33  Color, Urine Latest Range: YELLOW  YELLOW  APPearance Latest Range: CLEAR  CLOUDY (A)  Specific Gravity, Urine Latest Range: 1.005-1.030  1.010  pH Latest Range: 5.0-8.0  6.5  Glucose, UA Latest Range: NEGATIVE mg/dL NEGATIVE  Bilirubin Urine Latest Range: NEGATIVE  NEGATIVE  Ketones, ur Latest Range: NEGATIVE mg/dL NEGATIVE  Protein Latest Range: NEGATIVE mg/dL NEGATIVE  Urobilinogen, UA Latest Range: 0.0-1.0 mg/dL 0.2  Nitrite Latest Range: NEGATIVE  NEGATIVE  Leukocytes, UA Latest Range: NEGATIVE  LARGE (A)  WBC, UA Latest Range: <3 WBC/hpf 21-50  RBC / HPF Latest Range: <3 RBC/hpf 11-20  Squamous Epithelial / LPF Latest Range: RARE  RARE    Bacteria, UA Latest Range: RARE  MANY (A)     Imaging results:  Ct Head Wo Contrast  11/09/2011  *RADIOLOGY REPORT*  Clinical Data: Unsteady gait.  Left body weakness.  CT HEAD WITHOUT CONTRAST  Technique:  Contiguous axial images were obtained from the base of the skull through the vertex without contrast.  Comparison: 08/22/2010.  Findings: No intracranial hemorrhage.  Prominent white matter type changes.  Posterior superior aspect of the posterior limb of the  right internal capsule small acute infarct not excluded.  Mild atrophy without hydrocephalus.  No intracranial mass lesion detected on this unenhanced exam.  Prior orbital surgery with wires and plate/screw is in place.  IMPRESSION: No intracranial hemorrhage.  Prominent white matter type changes.  Posterior superior aspect of the posterior limb of the right internal capsule small acute infarct not excluded.  Mild atrophy without hydrocephalus.  Prior orbital surgery with wires and plate/screw is in place.  Critical Value/emergent results were called by telephone at the time of interpretation on 11/09/2011   at 6:56 a.m.  to  Dr. Sabra Heck, who verbally acknowledged these results.  Original Report Authenticated By: Doug Sou, M.D.   Dg Chest Port 1 View  11/09/2011  *RADIOLOGY REPORT*  Clinical Data: Shortness of breath.  PORTABLE CHEST - 1 VIEW  Comparison: 08/22/2010 chest x-ray.  09/07/2010 chest CT.  Findings: Tortuous thoracic aorta.  Heart size top normal.  Mild central pulmonary vascular prominence.  Minimal basilar atelectasis/scarring.  No pulmonary edema or segmental infiltrate.  No gross pneumothorax.  IMPRESSION: Mildly tortuous aorta.  Heart size top normal with mild central pulmonary vascular prominence.  Minimal basilar atelectasis/scarring.  Original Report Authenticated By: Doug Sou, M.D.     Other results: EKG (11/09/2011) - regular rate of approximately 74 bpm, Normal Sinus Rhythm - no acute changes seen    Assessment & Plan:  1.) Altered mental status - multifactorial but presume it is more likely secondary to metabolic derangement with elevated calcium levels. Other causes to consider are: drugs, although less likely based on negative drug screen, alcohol intoxication also less likely as levels <11, alcohol withdrawal, and stroke based on CT findings, as well as urinary tract infection. Will check TSH, and ammonia level for further evaluation. *Of note, patient had similar constellation of symptoms and presentation in October 2011 thought to be related to hypercalcemia. Please see below list below for more detail on problem and management.   A) Hypercalcemia - Secondary to primary hyperparathyroidism. 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 However, repeat scan showed residual adenoma and patient was instructed to follow up with surgery which he did not. Patient has suffered complications of hypercalcemia including altered mental status, pancreatitis, nephrolithiasis and nephrogenic diabetes insipidus. Previously treated with fluids, lasix, calcitonin, pamidronate and zoledronic acid during last admission. Patient was also seen by endocrinology while inpatient, but unknown if patient followed up as outpatient.   Plan: -Aggressive hydration  -Monitor input and output -Lasix 80 mg IV x 1 dose given renal insufficiency  -Calcitonin administration given Ca >15 and symptomatic  -Consider bisphosphonate when patient is adequately hydrated to control bone resorption (pamidronate vs zoledronic acid) -Repeat parathyroid scan, and surgery consult when patient is clinically stable  -BMET Q4 hours to monitor Ca levels  -Check PTH levels   B) Urinary tract infection - Patient has many leukocytes, along with 21-50 WBC. As previously mentioned, history difficult to obtain, and thus unknown if patient is symptomatic. He does have altered mental status which may be contributed by UTI.    Plan: -Urine culture -Start patient on Rocephin IV  C) CVA - Initially patient presented as code stroke with slurred speech and CT findings that could not exclude a small infarct. Neuro evaluated patient and found that his clinical picture did not correlate to radiographic studies, and this altered sensorium was secondary to metabolic derangement. No further studies such as Echo, dopplers, and MRI as hypercalcemia is more likely working diagnosis.  Of note patient has age advanced atrophy which makes this patient's baseline difficult to interpret. Suspect patient has baseline dementia.   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 Patient was supposed to follow up with surgery as outpatient, but believe he did not do so. Suspect residual adenoma is causing elevation of PTH and Ca levels.   Plan: -Management of hypercalcemia as above -Check PTH levels -Consult surgery once patient is more clinically stable -Repeat parathyroid scan  -Consider endocrine consult for assistance with bisphosphonate administration and hypercalcemia management   3.) Acute on chronic renal insufficiency - Patient has a baseline Cr between 1.7-1.9, however went up to 2.3 during last admission, thought to be secondary to diabetes insipidus due to hypercalcemia. Today it is acute on chronic likely related to dehydration and hypercalcemia.   Plan: -Monitor renal function closely -Aggressive hydration -Insert foley catheter   4.) Alcoholism - although alcohol level <11, unknown when patient's last had alcohol. Will hydrate, and place patient on CIWA protocol.   5.) History of pancreatitis - with multiple prior admissions. Although patient does not complain of abdominal pain at this time, will check lipase and keep patient NPO.   6.) Schizophrenia - was discharged on zyprexa during last hospitalization. Unknown and unlikely that patient is  taking. There was a trazodone bottle present at bedside.  Plan: -Continue to monitor -Haldol prn agitation -Consider restarting Zyprexa when medically stable   7.) Hypertension - previously on Norvasc, but med rec shows patient on unknown dose of verapamil.   Plan:  -Continue to monitor -Hydralazine prn, and consider restarting an agent like Norvasc when more stable   DVT PPX - Lovenox      Teyah Rossy 11/09/2011, 9:48 AM

## 2011-11-09 NOTE — Progress Notes (Signed)
CRITICAL VALUE ALERT  Critical value received: calcium Ionized 2.24, Ca+ 14.9  Date of notification:11/09/2011  Time of notification:  9:31 PM  Critical value read back:yes  Nurse who received alert:  Roetta Sessions  MD notified (1st page):Dr [facility]  Time of first page:  2131  MD notified (2nd page):  Time of second page:  Responding MD:  Dr Myrtie Cruise  Time MD responded:  2131

## 2011-11-09 NOTE — ED Notes (Signed)
Notified Dr. Winfred Leeds of elevated calcium > 15

## 2011-11-10 ENCOUNTER — Inpatient Hospital Stay (HOSPITAL_COMMUNITY): Payer: Medicaid Other

## 2011-11-10 LAB — BASIC METABOLIC PANEL
BUN: 30 mg/dL — ABNORMAL HIGH (ref 6–23)
BUN: 29 mg/dL — ABNORMAL HIGH (ref 6–23)
CO2: 17 mEq/L — ABNORMAL LOW (ref 19–32)
Calcium: 13.4 mg/dL (ref 8.4–10.5)
Calcium: 12.2 mg/dL — ABNORMAL HIGH (ref 8.4–10.5)
Chloride: 114 mEq/L — ABNORMAL HIGH (ref 96–112)
Chloride: 116 mEq/L — ABNORMAL HIGH (ref 96–112)
GFR calc Af Amer: 26 mL/min — ABNORMAL LOW (ref >90)
GFR calc Af Amer: 24 mL/min — ABNORMAL LOW (ref >90)
GFR calc Af Amer: 25 mL/min — ABNORMAL LOW (ref >90)
GFR calc non Af Amer: 22 mL/min — ABNORMAL LOW (ref >90)
GFR calc non Af Amer: 20 mL/min — ABNORMAL LOW (ref >90)
GFR calc non Af Amer: 21 mL/min — ABNORMAL LOW (ref >90)
GFR calc non Af Amer: 21 mL/min — ABNORMAL LOW (ref >90)
Glucose, Bld: 113 mg/dL — ABNORMAL HIGH (ref 70–99)
Glucose, Bld: 143 mg/dL — ABNORMAL HIGH (ref 70–99)
Glucose, Bld: 144 mg/dL — ABNORMAL HIGH (ref 70–99)
Glucose, Bld: 136 mg/dL — ABNORMAL HIGH (ref 70–99)
Potassium: 3.3 mEq/L — ABNORMAL LOW (ref 3.5–5.1)
Potassium: 3.4 mEq/L — ABNORMAL LOW (ref 3.5–5.1)
Potassium: 3.5 mEq/L (ref 3.5–5.1)
Potassium: 3.4 mEq/L — ABNORMAL LOW (ref 3.5–5.1)
Sodium: 139 mEq/L (ref 135–145)
Sodium: 141 mEq/L (ref 135–145)
Sodium: 144 mEq/L (ref 135–145)
Sodium: 145 mEq/L (ref 135–145)

## 2011-11-10 LAB — CBC
HCT: 36.7 % — ABNORMAL LOW (ref 39.0–52.0)
Hemoglobin: 12.2 g/dL — ABNORMAL LOW (ref 13.0–17.0)
MCV: 88.9 fL (ref 78.0–100.0)
RBC: 4.13 MIL/uL — ABNORMAL LOW (ref 4.22–5.81)
WBC: 9.9 10*3/uL (ref 4.0–10.5)

## 2011-11-10 MED ORDER — AMLODIPINE BESYLATE 10 MG PO TABS
10.0000 mg | ORAL_TABLET | Freq: Every day | ORAL | Status: DC
  Administered 2011-11-10 – 2011-11-14 (×5): 10 mg via ORAL
  Filled 2011-11-10 (×5): qty 1

## 2011-11-10 MED ORDER — SODIUM CHLORIDE 0.9 % IV SOLN
INTRAVENOUS | Status: DC
  Administered 2011-11-10: 21:00:00 via INTRAVENOUS

## 2011-11-10 MED ORDER — TECHNETIUM TC 99M SESTAMIBI - CARDIOLITE
25.0000 | Freq: Once | INTRAVENOUS | Status: AC | PRN
  Administered 2011-11-10: 17:00:00 25 via INTRAVENOUS

## 2011-11-10 MED ORDER — CALCITONIN (SALMON) 200 UNIT/ML IJ SOLN
300.0000 [IU] | Freq: Once | INTRAMUSCULAR | Status: AC
  Administered 2011-11-10: 300 [IU] via INTRAMUSCULAR
  Filled 2011-11-10: qty 1.5

## 2011-11-10 MED ORDER — SODIUM CHLORIDE 0.9 % IV SOLN
INTRAVENOUS | Status: AC
  Administered 2011-11-10: 17:00:00 via INTRAVENOUS

## 2011-11-10 NOTE — H&P (Signed)
Internal Medicine Teaching Service Attending Note Date: 11/10/2011  Patient name: [name]  Medical record number: [id number]  Date of birth: 09/12/1946   I have seen and evaluated [name] and discussed their care with the Residency Team.  Patient is a little better today now that his Ca++ is down to 12.8. He has been living with his brother.  Physical Exam: Blood pressure 166/95, pulse 88, temperature 98.6 F (37 C), temperature source Oral, resp. rate 20, height 5\' 9"  (1.753 m), weight 157 lb (71.215 kg), SpO2 97.00%. Somnulent male in NAD, A&O x 2 Lungs-clear Heart-RRR Abdom-+BS, NT Extrem-no edema Neuro-as above, moving all extremities, difficult to do more of an exam  Lab results: Results for orders placed during the hospital encounter of 11/09/11 (from the past 24 hour(s))  MAGNESIUM     Status: Normal   Collection Time   11/09/11  2:55 PM      Component Value Range   Magnesium 2.0  1.5 - 2.5 (mg/dL)  PHOSPHORUS     Status: Normal   Collection Time   11/09/11  2:55 PM      Component Value Range   Phosphorus 2.9  2.3 - 4.6 (mg/dL)  TSH     Status: Normal   Collection Time   11/09/11  2:55 PM      Component Value Range   TSH 1.237  0.350 - 4.500 (uIU/mL)  BASIC METABOLIC PANEL     Status: Abnormal   Collection Time   11/09/11  2:55 PM      Component Value Range   Sodium 143  135 - 145 (mEq/L)   Potassium 3.6  3.5 - 5.1 (mEq/L)   Chloride 110  96 - 112 (mEq/L)   CO2 23  19 - 32 (mEq/L)   Glucose, Bld 99  70 - 99 (mg/dL)   BUN 34 (*) 6 - 23 (mg/dL)   Creatinine, Ser 3.51 (*) 0.50 - 1.35 (mg/dL)   Calcium >15.0 (*) 8.4 - 10.5 (mg/dL)   GFR calc non Af Amer 17 (*) >90 (mL/min)   GFR calc Af Amer 20 (*) >90 (mL/min)  CALCIUM, IONIZED     Status: Abnormal   Collection Time   11/09/11  2:57 PM      Component Value Range   Calcium, Ion 2.21 (*) 1.12 - 1.32 (mmol/L)  MRSA PCR SCREENING     Status: Normal   Collection Time   11/09/11  6:49 PM   Component Value Range   MRSA by PCR NEGATIVE  NEGATIVE   BASIC METABOLIC PANEL     Status: Abnormal   Collection Time   11/09/11  8:04 PM      Component Value Range   Sodium 142  135 - 145 (mEq/L)   Potassium 3.6  3.5 - 5.1 (mEq/L)   Chloride 110  96 - 112 (mEq/L)   CO2 23  19 - 32 (mEq/L)   Glucose, Bld 94  70 - 99 (mg/dL)   BUN 33 (*) 6 - 23 (mg/dL)   Creatinine, Ser 3.26 (*) 0.50 - 1.35 (mg/dL)   Calcium 14.9 (*) 8.4 - 10.5 (mg/dL)   GFR calc non Af Amer 19 (*) >90 (mL/min)   GFR calc Af Amer 22 (*) >90 (mL/min)  LIPASE, BLOOD     Status: Abnormal   Collection Time   11/09/11  8:04 PM      Component Value Range   Lipase 123 (*) 11 - 59 (U/L)  BASIC METABOLIC PANEL  Status: Abnormal   Collection Time   11/10/11 12:21 AM      Component Value Range   Sodium 139  135 - 145 (mEq/L)   Potassium 3.3 (*) 3.5 - 5.1 (mEq/L)   Chloride 110  96 - 112 (mEq/L)   CO2 19  19 - 32 (mEq/L)   Glucose, Bld 113 (*) 70 - 99 (mg/dL)   BUN 32 (*) 6 - 23 (mg/dL)   Creatinine, Ser 2.79 (*) 0.50 - 1.35 (mg/dL)   Calcium 13.4 (*) 8.4 - 10.5 (mg/dL)   GFR calc non Af Amer 22 (*) >90 (mL/min)   GFR calc Af Amer 26 (*) >90 (mL/min)  BASIC METABOLIC PANEL     Status: Abnormal   Collection Time   11/10/11  3:50 AM      Component Value Range   Sodium 141  135 - 145 (mEq/L)   Potassium 3.4 (*) 3.5 - 5.1 (mEq/L)   Chloride 114 (*) 96 - 112 (mEq/L)   CO2 17 (*) 19 - 32 (mEq/L)   Glucose, Bld 143 (*) 70 - 99 (mg/dL)   BUN 31 (*) 6 - 23 (mg/dL)   Creatinine, Ser 3.06 (*) 0.50 - 1.35 (mg/dL)   Calcium 12.8 (*) 8.4 - 10.5 (mg/dL)   GFR calc non Af Amer 20 (*) >90 (mL/min)   GFR calc Af Amer 23 (*) >90 (mL/min)  CBC     Status: Abnormal   Collection Time   11/10/11  3:50 AM      Component Value Range   WBC 9.9  4.0 - 10.5 (K/uL)   RBC 4.13 (*) 4.22 - 5.81 (MIL/uL)   Hemoglobin 12.2 (*) 13.0 - 17.0 (g/dL)   HCT 36.7 (*) 39.0 - 52.0 (%)   MCV 88.9  78.0 - 100.0 (fL)   MCH 29.5  26.0 - 34.0 (pg)     MCHC 33.2  30.0 - 36.0 (g/dL)   RDW 14.5  11.5 - 15.5 (%)   Platelets 355  150 - 400 (K/uL)    Imaging results:  Ct Head Wo Contrast  11/09/2011  *RADIOLOGY REPORT*  Clinical Data: Unsteady gait.  Left body weakness.  CT HEAD WITHOUT CONTRAST  Technique:  Contiguous axial images were obtained from the base of the skull through the vertex without contrast.  Comparison: 08/22/2010.  Findings: No intracranial hemorrhage.  Prominent white matter type changes.  Posterior superior aspect of the posterior limb of the right internal capsule small acute infarct not excluded.  Mild atrophy without hydrocephalus.  No intracranial mass lesion detected on this unenhanced exam.  Prior orbital surgery with wires and plate/screw is in place.  IMPRESSION: No intracranial hemorrhage.  Prominent white matter type changes.  Posterior superior aspect of the posterior limb of the right internal capsule small acute infarct not excluded.  Mild atrophy without hydrocephalus.  Prior orbital surgery with wires and plate/screw is in place.  Critical Value/emergent results were called by telephone at the time of interpretation on 11/09/2011   at 6:56 a.m.  to  Dr. Sabra Heck, who verbally acknowledged these results.  Original Report Authenticated By: Doug Sou, M.D.   Dg Chest Port 1 View  11/09/2011  *RADIOLOGY REPORT*  Clinical Data: Shortness of breath.  PORTABLE CHEST - 1 VIEW  Comparison: 08/22/2010 chest x-ray.  09/07/2010 chest CT.  Findings: Tortuous thoracic aorta.  Heart size top normal.  Mild central pulmonary vascular prominence.  Minimal basilar atelectasis/scarring.  No pulmonary edema or segmental infiltrate.  No gross pneumothorax.  IMPRESSION: Mildly tortuous aorta.  Heart size top normal with mild central pulmonary vascular prominence.  Minimal basilar atelectasis/scarring.  Original Report Authenticated By: Doug Sou, M.D.    Assessment and Plan: I agree with the formulated Assessment and Plan with  the following changes:  Patient has gotten perfect treatment for hypercalcemia. The bisphosphonate should start working within the next day. 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 I agree with the rest of plan as per team.

## 2011-11-10 NOTE — Progress Notes (Signed)
Stroke Team Progress Note  SUBJECTIVE  [name] is a 65 y.o. male who presented with unclear symptoms of stroke. He presented with symptoms of gait disturbance, slurred speech.  Overall he feels his condition is gradually improving.   OBJECTIVE Most recent Vital Signs: Temp: 98.6 F (37 C) (12/13 0500) Temp src: Oral (12/13 0500) BP: 166/95 mmHg (12/13 0600) Pulse Rate: 88  (12/13 0600) Respiratory Rate: 20 O2 Saturdation: 97%  CBG (last 3)   Basename 11/09/11 0700  GLUCAP 110*   Intake/Output from previous day: 12/12 0701 - 12/13 0700 In: 5508 [I.V.:5000; IV Piggyback:508] Out: 4550 [Urine:4500; Emesis/NG output:50]  IV Fluid Intake:     . sodium chloride 300 mL/hr at 11/10/11 0613   Diet:  NPO  Activity:  Bedrest DVT Prophylaxis:  Sq heparin  Studies: CBC    Component Value Date/Time   WBC 9.9 11/10/2011 0350   RBC 4.13* 11/10/2011 0350   HGB 12.2* 11/10/2011 0350   HCT 36.7* 11/10/2011 0350   PLT 355 11/10/2011 0350   MCV 88.9 11/10/2011 0350   MCH 29.5 11/10/2011 0350   MCHC 33.2 11/10/2011 0350   RDW 14.5 11/10/2011 0350   LYMPHSABS 1.0 11/09/2011 0710   MONOABS 0.4 11/09/2011 0710   EOSABS 0.1 11/09/2011 0710   BASOSABS 0.0 11/09/2011 0710   CMP    Component Value Date/Time   NA 141 11/10/2011 0350   K 3.4* 11/10/2011 0350   CL 114* 11/10/2011 0350   CO2 17* 11/10/2011 0350   GLUCOSE 143* 11/10/2011 0350   BUN 31* 11/10/2011 0350   CREATININE 3.06* 11/10/2011 0350   CALCIUM 12.8* 11/10/2011 0350   CALCIUM 11.6* 09/07/2010 1115   PROT 7.7 11/09/2011 0710   ALBUMIN 3.4* 11/09/2011 0710   AST 17 11/09/2011 0710   ALT 7 11/09/2011 0710   ALKPHOS 121* 11/09/2011 0710   BILITOT 0.2* 11/09/2011 0710   GFRNONAA 20* 11/10/2011 0350   GFRAA 23* 11/10/2011 0350   COAGS Lab Results  Component Value Date   INR 1.03 11/09/2011   INR 0.91 08/15/2010   INR 1.0 08/11/2008   Lipid Panel    Component Value Date/Time   CHOL  Value: 183         ATP III CLASSIFICATION:  <200     mg/dL   Desirable  200-239  mg/dL   Borderline High  >=240    mg/dL   High        08/15/2010 1634   TRIG 140 08/15/2010 1634   HDL 38* 08/15/2010 1634   CHOLHDL 4.8 08/15/2010 1634   VLDL 28 08/15/2010 1634   LDLCALC  Value: 117        Total Cholesterol/HDL:CHD Risk Coronary Heart Disease Risk Table                     Men   Women  1/2 Average Risk   3.4   3.3  Average Risk       5.0   4.4  2 X Average Risk   9.6   7.1  3 X Average Risk  23.4   11.0        Use the calculated Patient Ratio above and the CHD Risk Table to determine the patient's CHD Risk.        ATP III CLASSIFICATION (LDL):  <100     mg/dL   Optimal  100-129  mg/dL   Near or Above  Optimal  130-159  mg/dL   Borderline  160-189  mg/dL   High  >190     mg/dL   Very High* 08/15/2010 1634   HgbA1C  Lab Results  Component Value Date   HGBA1C  Value: 6.2 (NOTE)                                                                       According to the ADA Clinical Practice Recommendations for 2011, when HbA1c is used as a screening test:   >=6.5%   Diagnostic of Diabetes Mellitus           (if abnormal result  is confirmed)  5.7-6.4%   Increased risk of developing Diabetes Mellitus  References:Diagnosis and Classification of Diabetes Mellitus,Diabetes [id number] 1):S62-S69 and Standards of Medical Care in         Diabetes - 2011,Diabetes [id number]  (Suppl 1):S11-S61.* 08/15/2010   Urine Drug Screen    Component Value Date/Time   LABOPIA NONE DETECTED 11/09/2011 0733   LABOPIA NEGATIVE 08/10/2007 1907   COCAINSCRNUR NONE DETECTED 11/09/2011 0733   COCAINSCRNUR NEGATIVE 08/10/2007 1907   LABBENZ NONE DETECTED 11/09/2011 0733   LABBENZ NEGATIVE 08/10/2007 1907   AMPHETMU NONE DETECTED 11/09/2011 0733   AMPHETMU NEGATIVE 08/10/2007 [place] DETECTED 11/09/2011 0733   LABBARB NONE DETECTED 11/09/2011 0733    Alcohol Level    Component Value Date/Time   ETH <11 11/09/2011 0710    CT of the brain  No intracranial hemorrhage.  Prominent white matter type changes.  Posterior superior aspect of the posterior limb of the right internal capsule small acute infarct not excluded.  Mild atrophy without hydrocephalus.  Prior orbital surgery with wires and plate/screw is in place.   CXR   Mildly tortuous aorta.  Heart size top normal with mild central pulmonary vascular prominence.  Minimal basilar atelectasis/scarring.  O  Physical Exam   Drowsy but opens eyes easily and follows simple commands. Afebrile. Head is non traumatic neck is supple without bruit. Cardiac exam no murmur gallop. Lungs clear to auscultation. Neurological exam : drowsy but arouses easily and follows simple commands. Diminished attention, short-term memory and recall. Easy distractibility. Eye movements are full range without nystagmus. Blinks to threat bilaterally. There is no facial weakness. Able to move all 4 extremities equally well against gravity without any focal weakness. Not cooperative for detailed testing of sensation and coordination. Gait was not tested. ASSESSMENT [name] is a 66 y.o. male  Who had hypercalcemia, encephalopathy treated with improvement. No obvious focal neuro symptoms associated with stroke.Altered mental status appears to be improving with treatment of hypercalcemia and was probably related to  That rather than  a stroke. Stroke risk factors:  hypertension  Hospital day # 1  TREATMENT/PLAN No further stroke workup needed. Agree with hypercalcemia diagnosis. Will sign off. No neuro followup needed.  Steffanie Rainwater, ANP-BC, GNP-BC [facility] Pager: [phone] 11/10/2011 8:19 AM  Dr. Antony Contras, [place] Director, has personally reviewed chart, pertinent data, examined the patient and developed the plan of care.

## 2011-11-10 NOTE — Consult Note (Signed)
Reason for Consult:Hypercalcemia with hx of parathyroid adenoma resection Referring Physician: Dr. Lavella [name] is an 65 y.o. male.  HPI: Patient is a 65 year old male who presented with somnolence and altered mental status. He was initially treated as a current stroke. Patient was opened in size but would not follow commands. His speech was incoherent. CT scan revealed the possibility of a small infarct. Creatinine on admission was 3.67, calcium was 12.8. He also has some mild pancreatitis with a lipase elevation of 123. He also has a urinary tract infection. 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 Johnathan [name]. Postoperatively he had a parathyroid study which showed focal activity in the right lobe significant for a probable residual adenoma. The patient was instructed to followup with Dr. Johnathan [name], but has not done so at this point. We're asked to see in consultation, and followup. A repeat Parathyroid study is pending.  Pt. Is sedated and not answering any questions, on Ativan for SWIA protocol.  His nurse assure me he can speak, he has just had his bath and is tired.  Past Medical History  Diagnosis Date  . Schizophrenia     previously on zyprexa  . Primary hyperparathyroidism     s/p parathyroid adenoma resection Oct. 8, 2011, repeat scan showed residual right adenoma, however pt did not f/u with surgery as outpt  . Hypercalcemia     2/2 primary hyperparathyroidism  . History of nephrolithiasis 2008    s/p ureterolithotomy   . Pancreatitis 2011    history of several admissions for pancreatitis   . Alcoholism   . Chronic renal insufficiency     with history of acute on chronic secondary to dehydration and hypercalcemia  . Altered mental status 2011    hx of 2/2 hypercalcemia, EtOH w/d and medication overuse  . Hypertension   . Tobacco abuse   . Fx humeral neck 2008    right side, no history of surgical repair    Past Surgical  History  Procedure Date  . Parathyroid adenoma resection Oct. 8, 2011    residual adenoma Oct. 12, 2011    History reviewed. No pertinent family history.  Social History:  reports that he has been smoking Cigarettes.  He does not have any smokeless tobacco history on file. He reports that he drinks alcohol. He reports that he uses illicit drugs.  Allergies: No Known Allergies  Medications:  Prior to Admission:  Prescriptions prior to admission  Medication Sig Dispense Refill  . OLANZapine (ZYPREXA) 20 MG tablet Take 20 mg by mouth at bedtime.        . traZODone (DESYREL) 100 MG tablet Take 100 mg by mouth at bedtime.         Scheduled:   . amLODipine  10 mg Oral Daily  . calcitonin  300 Units Intramuscular Once  . calcitonin  300 Units Intramuscular Once  . cefTRIAXone (ROCEPHIN)  IV  1 g Intravenous Q24H  . heparin subcutaneous  5,000 Units Subcutaneous Q8H  . LORazepam  0-4 mg Intravenous Q6H   Followed by  . LORazepam  0-4 mg Intravenous Q12H   Continuous:   . sodium chloride 250 mL/hr at 11/10/11 1643   Followed by  . sodium chloride 75 mL/hr at 11/10/11 2044  . DISCONTD: sodium chloride 300 mL/hr at 11/10/11 [id number]    Results for orders placed during the hospital encounter of 11/09/11 (from the past 48 hour(s))  URINALYSIS, ROUTINE W REFLEX MICROSCOPIC  Status: Abnormal   Collection Time   11/09/11  7:33 AM      Component Value Range Comment   Color, Urine YELLOW  YELLOW     APPearance CLOUDY (*) CLEAR     Specific Gravity, Urine 1.010  1.005 - 1.030     pH 6.5  5.0 - 8.0     Glucose, UA NEGATIVE  NEGATIVE (mg/dL)    Hgb urine dipstick MODERATE (*) NEGATIVE     Bilirubin Urine NEGATIVE  NEGATIVE     Ketones, ur NEGATIVE  NEGATIVE (mg/dL)    Protein, ur NEGATIVE  NEGATIVE (mg/dL)    Urobilinogen, UA 0.2  0.0 - 1.0 (mg/dL)    Nitrite NEGATIVE  NEGATIVE     Leukocytes, UA LARGE (*) NEGATIVE    URINE RAPID DRUG SCREEN (HOSP PERFORMED)     Status: Normal    Collection Time   11/09/11  7:33 AM      Component Value Range Comment   Opiates NONE DETECTED  NONE DETECTED     Cocaine NONE DETECTED  NONE DETECTED     Benzodiazepines NONE DETECTED  NONE DETECTED     Amphetamines NONE DETECTED  NONE DETECTED     Tetrahydrocannabinol NONE DETECTED  NONE DETECTED     Barbiturates NONE DETECTED  NONE DETECTED    URINE MICROSCOPIC-ADD ON     Status: Abnormal   Collection Time   11/09/11  7:33 AM      Component Value Range Comment   Squamous Epithelial / LPF RARE  RARE     WBC, UA 21-50  <3 (WBC/hpf) WBC CLUSTERS NOTED   RBC / HPF 11-20  <3 (RBC/hpf)    Bacteria, UA MANY (*) RARE    URINE CULTURE     Status: Normal (Preliminary result)   Collection Time   11/09/11  7:33 AM      Component Value Range Comment   Specimen Description URINE, CATHETERIZED      Special Requests NONE      Setup Time [id number]      Colony Count PENDING      Culture Culture reincubated for better growth      Report Status PENDING     MAGNESIUM     Status: Normal   Collection Time   11/09/11  2:55 PM      Component Value Range Comment   Magnesium 2.0  1.5 - 2.5 (mg/dL)   PHOSPHORUS     Status: Normal   Collection Time   11/09/11  2:55 PM      Component Value Range Comment   Phosphorus 2.9  2.3 - 4.6 (mg/dL)   TSH     Status: Normal   Collection Time   11/09/11  2:55 PM      Component Value Range Comment   TSH 1.237  0.350 - 4.500 (uIU/mL)   PTH, INTACT AND CALCIUM     Status: Abnormal   Collection Time   11/09/11  2:55 PM      Component Value Range Comment   PTH 954.1 (*) 14.0 - 72.0 (pg/mL)    Calcium, Total (PTH) >15.0 (*) 8.4 - 10.5 (mg/dL)   BASIC METABOLIC PANEL     Status: Abnormal   Collection Time   11/09/11  2:55 PM      Component Value Range Comment   Sodium 143  135 - 145 (mEq/L)    Potassium 3.6  3.5 - 5.1 (mEq/L)    Chloride 110  96 - 112 (mEq/L)  CO2 23  19 - 32 (mEq/L)    Glucose, Bld 99  70 - 99 (mg/dL)    BUN 34 (*) 6 - 23 (mg/dL)      Creatinine, Ser 3.51 (*) 0.50 - 1.35 (mg/dL)    Calcium >15.0 (*) 8.4 - 10.5 (mg/dL)    GFR calc non Af Amer 17 (*) >90 (mL/min)    GFR calc Af Amer 20 (*) >90 (mL/min)   CALCIUM, IONIZED     Status: Abnormal   Collection Time   11/09/11  2:57 PM      Component Value Range Comment   Calcium, Ion 2.21 (*) 1.12 - 1.32 (mmol/L)   MRSA PCR SCREENING     Status: Normal   Collection Time   11/09/11  6:49 PM      Component Value Range Comment   MRSA by PCR NEGATIVE  NEGATIVE    BASIC METABOLIC PANEL     Status: Abnormal   Collection Time   11/09/11  8:04 PM      Component Value Range Comment   Sodium 142  135 - 145 (mEq/L)    Potassium 3.6  3.5 - 5.1 (mEq/L)    Chloride 110  96 - 112 (mEq/L)    CO2 23  19 - 32 (mEq/L)    Glucose, Bld 94  70 - 99 (mg/dL)    BUN 33 (*) 6 - 23 (mg/dL)    Creatinine, Ser 3.26 (*) 0.50 - 1.35 (mg/dL)    Calcium 14.9 (*) 8.4 - 10.5 (mg/dL)    GFR calc non Af Amer 19 (*) >90 (mL/min)    GFR calc Af Amer 22 (*) >90 (mL/min)   LIPASE, BLOOD     Status: Abnormal   Collection Time   11/09/11  8:04 PM      Component Value Range Comment   Lipase 123 (*) 11 - 59 (U/L)   BASIC METABOLIC PANEL     Status: Abnormal   Collection Time   11/10/11 12:21 AM      Component Value Range Comment   Sodium 139  135 - 145 (mEq/L)    Potassium 3.3 (*) 3.5 - 5.1 (mEq/L)    Chloride 110  96 - 112 (mEq/L)    CO2 19  19 - 32 (mEq/L)    Glucose, Bld 113 (*) 70 - 99 (mg/dL)    BUN 32 (*) 6 - 23 (mg/dL)    Creatinine, Ser 2.79 (*) 0.50 - 1.35 (mg/dL)    Calcium 13.4 (*) 8.4 - 10.5 (mg/dL)    GFR calc non Af Amer 22 (*) >90 (mL/min)    GFR calc Af Amer 26 (*) >90 (mL/min)   BASIC METABOLIC PANEL     Status: Abnormal   Collection Time   11/10/11  3:50 AM      Component Value Range Comment   Sodium 141  135 - 145 (mEq/L)    Potassium 3.4 (*) 3.5 - 5.1 (mEq/L)    Chloride 114 (*) 96 - 112 (mEq/L)    CO2 17 (*) 19 - 32 (mEq/L)    Glucose, Bld 143 (*) 70 - 99 (mg/dL)    BUN  31 (*) 6 - 23 (mg/dL)    Creatinine, Ser 3.06 (*) 0.50 - 1.35 (mg/dL)    Calcium 12.8 (*) 8.4 - 10.5 (mg/dL)    GFR calc non Af Amer 20 (*) >90 (mL/min)    GFR calc Af Amer 23 (*) >90 (mL/min)   CBC     Status:  Abnormal   Collection Time   11/10/11  3:50 AM      Component Value Range Comment   WBC 9.9  4.0 - 10.5 (K/uL)    RBC 4.13 (*) 4.22 - 5.81 (MIL/uL)    Hemoglobin 12.2 (*) 13.0 - 17.0 (g/dL)    HCT 36.7 (*) 39.0 - 52.0 (%)    MCV 88.9  78.0 - 100.0 (fL)    MCH 29.5  26.0 - 34.0 (pg)    MCHC 33.2  30.0 - 36.0 (g/dL)    RDW 14.5  11.5 - 15.5 (%)    Platelets 355  150 - 400 (K/uL)   BASIC METABOLIC PANEL     Status: Abnormal   Collection Time   11/10/11  8:31 AM      Component Value Range Comment   Sodium 144  135 - 145 (mEq/L)    Potassium 3.5  3.5 - 5.1 (mEq/L)    Chloride 116 (*) 96 - 112 (mEq/L)    CO2 18 (*) 19 - 32 (mEq/L)    Glucose, Bld 144 (*) 70 - 99 (mg/dL)    BUN 30 (*) 6 - 23 (mg/dL)    Creatinine, Ser 3.02 (*) 0.50 - 1.35 (mg/dL)    Calcium 12.2 (*) 8.4 - 10.5 (mg/dL)    GFR calc non Af Amer 20 (*) >90 (mL/min)    GFR calc Af Amer 24 (*) >90 (mL/min)   BASIC METABOLIC PANEL     Status: Abnormal   Collection Time   11/10/11  1:11 PM      Component Value Range Comment   Sodium 145  135 - 145 (mEq/L)    Potassium 3.5  3.5 - 5.1 (mEq/L)    Chloride 116 (*) 96 - 112 (mEq/L)    CO2 18 (*) 19 - 32 (mEq/L)    Glucose, Bld 136 (*) 70 - 99 (mg/dL)    BUN 30 (*) 6 - 23 (mg/dL)    Creatinine, Ser 2.93 (*) 0.50 - 1.35 (mg/dL)    Calcium 12.2 (*) 8.4 - 10.5 (mg/dL)    GFR calc non Af Amer 21 (*) >90 (mL/min)    GFR calc Af Amer 25 (*) >90 (mL/min)   BASIC METABOLIC PANEL     Status: Abnormal   Collection Time   11/10/11  7:17 PM      Component Value Range Comment   Sodium 143  135 - 145 (mEq/L)    Potassium 3.4 (*) 3.5 - 5.1 (mEq/L)    Chloride 114 (*) 96 - 112 (mEq/L)    CO2 16 (*) 19 - 32 (mEq/L)    Glucose, Bld 102 (*) 70 - 99 (mg/dL)    BUN 29 (*) 6 - 23  (mg/dL)    Creatinine, Ser 2.92 (*) 0.50 - 1.35 (mg/dL)    Calcium 11.5 (*) 8.4 - 10.5 (mg/dL)    GFR calc non Af Amer 21 (*) >90 (mL/min)    GFR calc Af Amer 25 (*) >90 (mL/min)   BASIC METABOLIC PANEL     Status: Abnormal   Collection Time   11/11/11 12:15 AM      Component Value Range Comment   Sodium 143  135 - 145 (mEq/L)    Potassium 5.5 (*) 3.5 - 5.1 (mEq/L) HEMOLYSIS AT THIS LEVEL MAY AFFECT RESULT   Chloride 115 (*) 96 - 112 (mEq/L)    CO2 17 (*) 19 - 32 (mEq/L)    Glucose, Bld 104 (*) 70 - 99 (mg/dL)  BUN 29 (*) 6 - 23 (mg/dL)    Creatinine, Ser 2.91 (*) 0.50 - 1.35 (mg/dL)    Calcium 11.0 (*) 8.4 - 10.5 (mg/dL)    GFR calc non Af Amer 21 (*) >90 (mL/min)    GFR calc Af Amer 25 (*) >90 (mL/min)   BASIC METABOLIC PANEL     Status: Abnormal   Collection Time   11/11/11  3:42 AM      Component Value Range Comment   Sodium 144  135 - 145 (mEq/L)    Potassium 3.5  3.5 - 5.1 (mEq/L)    Chloride 117 (*) 96 - 112 (mEq/L)    CO2 18 (*) 19 - 32 (mEq/L)    Glucose, Bld 99  70 - 99 (mg/dL)    BUN 30 (*) 6 - 23 (mg/dL)    Creatinine, Ser 3.07 (*) 0.50 - 1.35 (mg/dL)    Calcium 10.9 (*) 8.4 - 10.5 (mg/dL)    GFR calc non Af Amer 20 (*) >90 (mL/min)    GFR calc Af Amer 23 (*) >90 (mL/min)     Dg Chest Port 1 View  11/09/2011  *RADIOLOGY REPORT*  Clinical Data: Shortness of breath.  PORTABLE CHEST - 1 VIEW  Comparison: 08/22/2010 chest x-ray.  09/07/2010 chest CT.  Findings: Tortuous thoracic aorta.  Heart size top normal.  Mild central pulmonary vascular prominence.  Minimal basilar atelectasis/scarring.  No pulmonary edema or segmental infiltrate.  No gross pneumothorax.  IMPRESSION: Mildly tortuous aorta.  Heart size top normal with mild central pulmonary vascular prominence.  Minimal basilar atelectasis/scarring.  Original Report Authenticated By: Doug Sou, M.D.    Review of Systems  Unable to perform ROS: mental status change   Blood pressure 129/81, pulse 79,  temperature 99.3 F (37.4 C), temperature source Oral, resp. rate 22, height 5\' 9"  (1.753 m), weight 77.4 kg (170 lb 10.2 oz), SpO2 97.00%. Physical Exam  Constitutional:       Thin sedated male   HENT:  Head: Normocephalic and atraumatic.  Eyes: Conjunctivae and EOM are normal. Pupils are equal, round, and reactive to light.  Neck: Normal range of motion. Neck supple. No JVD present. No tracheal deviation present. No thyromegaly present.  Cardiovascular: Normal rate, regular rhythm and normal heart sounds.   Respiratory: Effort normal and breath sounds normal.  GI: Soft. Bowel sounds are normal. He exhibits no distension. There is no tenderness. There is no rebound and no guarding.  Musculoskeletal: Normal range of motion.  Lymphadenopathy:    He has no cervical adenopathy.  Neurological: No cranial nerve deficit.       He is sedated, he will open his eyes and look at you but I could not get him to speak.  Skin: Skin is warm and dry.  Psychiatric:       Pt would not speak, unable to access.    Assessment/Plan: 1. History of parathyroid adenoma resection 09/08/2010 with no postoperative followup. 2. Multifactorial altered mental status. 3. Hypercalcemia. 4. History of alcohol abuse. 5. History of schizophrenia. 6. History pancreatitis 7. Acute on chronic renal insufficiency. 8. Hypertension. 9. UTI 10. History of nephrolithiasis. Plan: Agree with current medical management. Once the patients acute medical issues are resolved, he may followup as an outpatient, followup for probable residual right lobe parathyroid adenoma.   Adrina Armijo 11/11/2011, 7:22 AM

## 2011-11-10 NOTE — Progress Notes (Signed)
Subjective:  Patient is still not oriented. Can not answer questions clearly.   Objective: Vital signs in last 24 hours: Filed Vitals:   11/10/11 0600 11/10/11 0830 11/10/11 1146 11/10/11 1400  BP: 166/95 167/93 188/105   Pulse: 88 93 101 96  Temp:  98.1 F (36.7 C) 98.4 F (36.9 C)   TempSrc:  Oral Oral   Resp: 20 22 28    Height:      Weight:      SpO2: 97% 98% 98%    Weight change:   Intake/Output Summary (Last 24 hours) at 11/10/11 1513 Last data filed at 11/10/11 1240  Gross per 24 hour  Intake   4358 ml  Output   3700 ml  Net    658 ml   Physical Exam:   General:  Alert, opens eyes, NAD, Patient is still not oriented. Can not answer questions  Head:  Normocephalic, atraumatic.  Neck:  No deformities, masses, or tenderness noted.Supple, No carotid Bruits, no JVD.  Lungs:   Normal respiratory effort. Clear to auscultation BL without crackles or wheezes.  Heart:  RRR. S1 and S2 normal without gallop, murmur, or rubs.  Abdomen:   BS normoactive. Soft, Nondistended, non-tender.  No masses or organomegaly.  Extremities:  No pretibial edema. DP pulse 2+  Neurologic:  Alert but not oriented, speech incoherent, did not follow commands, no obvious focal deficits noted, no facial droop    Lab Results: Basic Metabolic Panel:  Lab [id number] 11/10/11 0831 11/09/11 1455  NA 145 144 --  K 3.5 3.5 --  CL 116* 116* --  CO2 18* 18* --  GLUCOSE 136* 144* --  BUN 30* 30* --  CREATININE 2.93* 3.02* --  CALCIUM 12.2* 12.2* --  MG -- -- 2.0  PHOS -- -- 2.9   Liver Function Tests:  Lab 11/09/11 0710  AST 17  ALT 7  ALKPHOS 121*  BILITOT 0.2*  PROT 7.7  ALBUMIN 3.4*    Lab 11/09/11 2004  LIPASE 123*  AMYLASE --    Lab 11/09/11 0710  AMMONIA 31   CBC:  Lab 11/10/11 0350 11/09/11 0710  WBC 9.9 5.6  NEUTROABS -- 4.1  HGB 12.2* 11.2*  HCT 36.7* 34.8*  MCV 88.9 88.5  PLT 355 342   Cardiac Enzymes:  Lab 11/09/11 0710  CKTOTAL 119  CKMB 2.8    CKMBINDEX --  TROPONINI <0.30    CBG:  Lab 11/09/11 0700  GLUCAP 110*   Thyroid Function Tests:  Lab 11/09/11 1455  TSH 1.237  T4TOTAL --  FREET4 --  T3FREE --  THYROIDAB --   Coagulation:  Lab 11/09/11 0710  LABPROT 13.7  INR 1.03    Drugs of Abuse     Component Value Date/Time   LABOPIA NONE DETECTED 11/09/2011 0733   LABOPIA NEGATIVE 08/10/2007 1907   COCAINSCRNUR NONE DETECTED 11/09/2011 0733   COCAINSCRNUR NEGATIVE 08/10/2007 1907   LABBENZ NONE DETECTED 11/09/2011 0733   LABBENZ NEGATIVE 08/10/2007 1907   AMPHETMU NONE DETECTED 11/09/2011 0733   AMPHETMU NEGATIVE 08/10/2007 1907   THCU NONE DETECTED 11/09/2011 0733   LABBARB NONE DETECTED 11/09/2011 0733    Alcohol Level:  Lab 11/09/11 0710  ETH <11     Micro Results: Recent Results (from the past 240 hour(s))  MRSA PCR SCREENING     Status: Normal   Collection Time   11/09/11  6:49 PM      Component Value Range Status Comment   MRSA by PCR NEGATIVE  NEGATIVE  Final    Studies/Results: Ct Head Wo Contrast  11/09/2011  *RADIOLOGY REPORT*  Clinical Data: Unsteady gait.  Left body weakness.  CT HEAD WITHOUT CONTRAST  Technique:  Contiguous axial images were obtained from the base of the skull through the vertex without contrast.  Comparison: 08/22/2010.  Findings: No intracranial hemorrhage.  Prominent white matter type changes.  Posterior superior aspect of the posterior limb of the right internal capsule small acute infarct not excluded.  Mild atrophy without hydrocephalus.  No intracranial mass lesion detected on this unenhanced exam.  Prior orbital surgery with wires and plate/screw is in place.  IMPRESSION: No intracranial hemorrhage.  Prominent white matter type changes.  Posterior superior aspect of the posterior limb of the right internal capsule small acute infarct not excluded.  Mild atrophy without hydrocephalus.  Prior orbital surgery with wires and plate/screw is in place.  Critical  Value/emergent results were called by telephone at the time of interpretation on 11/09/2011   at 6:56 a.m.  to  Dr. Sabra Heck, who verbally acknowledged these results.  Original Report Authenticated By: Doug Sou, M.D.   Dg Chest Port 1 View  11/09/2011  *RADIOLOGY REPORT*  Clinical Data: Shortness of breath.  PORTABLE CHEST - 1 VIEW  Comparison: 08/22/2010 chest x-ray.  09/07/2010 chest CT.  Findings: Tortuous thoracic aorta.  Heart size top normal.  Mild central pulmonary vascular prominence.  Minimal basilar atelectasis/scarring.  No pulmonary edema or segmental infiltrate.  No gross pneumothorax.  IMPRESSION: Mildly tortuous aorta.  Heart size top normal with mild central pulmonary vascular prominence.  Minimal basilar atelectasis/scarring.  Original Report Authenticated By: Doug Sou, M.D.   Medications:  Scheduled Meds:   . calcitonin  300 Units Intramuscular Once  . calcitonin  300 Units Intramuscular Once  . cefTRIAXone (ROCEPHIN)  IV  1 g Intravenous Q24H  . heparin subcutaneous  5,000 Units Subcutaneous Q8H  . LORazepam  0-4 mg Intravenous Q6H   Followed by  . LORazepam  0-4 mg Intravenous Q12H  . pamidronate  90 mg Intravenous To Major  . DISCONTD: calcitonin  100 Units Intramuscular Once  . DISCONTD: enoxaparin  30 mg Subcutaneous Q24H   Continuous Infusions:   . sodium chloride     Followed by  . sodium chloride    . DISCONTD: sodium chloride 300 mL/hr at 11/10/11 0613   PRN Meds:.acetaminophen, hydrALAZINE, LORazepam, LORazepam, ondansetron (ZOFRAN) IV Assessment/Plan:  1.) Altered mental status -most likely due to hypercalcemia. Other differential diagnosis include, but less likely, drugs (UDS negative), alcohol intoxication (alcohol level <11), hypo-or hyperthyroidism (TSH normal), hepatic encephalopathy (no typical astaxis tremor, ammonia level normal), stroke (less likely, neutologist saw patient, did not believe diagnosis of stroke based on both clinic  jugement  And image fingdings). Of note, patient had similar constellation of symptoms and presentation in October 2011 thought to be related to hypercalcemia.     A) Hypercalcemia - Secondary to primary hyperparathyroidism. 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 However, repeat scan showed residual adenoma and patient was instructed to follow up with surgery which he did not. Patient has suffered complications of hypercalcemia including altered mental status, pancreatitis, nephrolithiasis and nephrogenic diabetes insipidus. Previously treated with fluids, lasix, calcitonin, pamidronate and zoledronic acid during last admission. Patient was also seen by endocrinology while inpatient, but unknown if patient followed up as outpatient.  Since admission, pateient has been treated with aggressive IV fluid, 3 dose of total 700 units of IV calcitonin and  one dose of 90 mg IV pamidronate (aredia). Patient's calcium level is trending down to 12.2 at 13:11 PM. But he is still not oriented. PTH level is 954.1 which is severely elevated.  Plan: -continue with aggressive hydration with NS at 250 cc/h. -Monitor input and output. If urine output is lower than 100/hour, will give a low dose of lasix. - will repeat parathyroid scan. -talked with surgeon on the phone. Suggested to get parathyroid scan first, then order CT or MRI depending on the scan finding for surgery reference. -BMET Q4 hours to monitor Ca levels.   B) Urinary tract infection - Patient has many leukocytes, along with 21-50 WBC. As previously mentioned, history difficult to obtain, and thus unknown if patient is symptomatic. He does have altered mental status which may be contributed by UTI.   Plan: -pending Urine culture -continue Rocephin IV  C) CVA - Initially patient presented as code stroke with slurred speech and CT findings that could not exclude a small infarct. Neuro evaluated patient and found that his clinical  picture did not correlate to radiographic studies, and this altered sensorium was secondary to metabolic derangement. No further studies such as Echo, dopplers, and MRI as hypercalcemia is more likely working diagnosis. Of note patient has age advanced atrophy which makes this patient's baseline difficult to interpret. Suspect patient has baseline dementia. Dr. Leonie Man re-evaluated patient on 11/10/11, he thinks that  n further stroke workup needed. Agree with hypercalcemia diagnosis. Will sign off.    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 Patient was supposed to follow up with surgery as outpatient, but believe he did not do so. Suspect residual adenoma is causing elevation of PTH and Ca levels. His PTH is 954.1  Plan: -Management of hypercalcemia as above -Repeat parathyroid scan  -talked with surgeon on the phone. Suggested to get parathyroid scan first, then order CT or MRI depending on the scan finding for surgery reference. -treat hypercalcemia as discussed above in 1)-A.   3.) Acute on chronic renal insufficiency - Patient has a baseline Cr between 1.7-1.9, however went up to 2.3 during last admission, thought to be secondary to diabetes insipidus due to hypercalcemia. Today it is acute on chronic likely related to dehydration and hypercalcemia. cre is trending down from 3.67 to 2.93 now.  Plan: -Monitor renal function closely -Aggressive hydration -Insert foley catheter   4.) Alcoholism - although alcohol level <11, unknown when patient's last had alcohol. Will hydrate, and place patient on CIWA protocol.   5.) History of pancreatitis - with multiple prior admissions. Although patient does not complain of abdominal pain at this time. His lipase is elevated (123) today.  - will keep patient NPO.  -continue IV fluid.  6.) Schizophrenia - was discharged on zyprexa during last hospitalization. Unknown  and unlikely that patient is taking. There was a trazodone bottle present at bedside.  Plan: -Continue to monitor -Haldol prn agitation -Consider restarting Zyprexa when medically stable   7.) Hypertension -Bp is 188/105,  previously on Norvasc, but med rec shows patient on unknown dose of verapamil.   Plan:   -Continue to monitor -will start Norvasc 10 mg po  daily   DVT PPX - Lovenox         LOS: 1 day   Ivor Costa 11/10/2011, 3:13 PM

## 2011-11-10 NOTE — Progress Notes (Signed)
CRITICAL VALUE ALERT  Critical value received:  Ca+13.4  Date of notification:  11/10/2011  Time of notification:  0145  Critical value read back:yes  Nurse who received alert:  Meleena Munroe, Logan Bores  MD notified (1st page):    Time of first page:   MD notified (2nd page):  Time of second page:  Responding MD:   Time MD responded:   Lab value consistent with previous result

## 2011-11-11 ENCOUNTER — Inpatient Hospital Stay (HOSPITAL_COMMUNITY): Payer: Medicaid Other

## 2011-11-11 DIAGNOSIS — R4182 Altered mental status, unspecified: Secondary | ICD-10-CM

## 2011-11-11 DIAGNOSIS — E213 Hyperparathyroidism, unspecified: Secondary | ICD-10-CM

## 2011-11-11 DIAGNOSIS — N390 Urinary tract infection, site not specified: Secondary | ICD-10-CM

## 2011-11-11 LAB — BASIC METABOLIC PANEL
Calcium: 11 mg/dL — ABNORMAL HIGH (ref 8.4–10.5)
Calcium: 10.9 mg/dL — ABNORMAL HIGH (ref 8.4–10.5)
Chloride: 115 mEq/L — ABNORMAL HIGH (ref 96–112)
Chloride: 117 mEq/L — ABNORMAL HIGH (ref 96–112)
Creatinine, Ser: 2.91 mg/dL — ABNORMAL HIGH (ref 0.50–1.35)
Creatinine, Ser: 3.07 mg/dL — ABNORMAL HIGH (ref 0.50–1.35)
GFR calc Af Amer: 25 mL/min — ABNORMAL LOW (ref >90)
GFR calc Af Amer: 23 mL/min — ABNORMAL LOW (ref >90)

## 2011-11-11 LAB — PARATHYROID HORMONE, INTACT (NO CA): PTH: 1462 pg/mL — ABNORMAL HIGH (ref 14.0–72.0)

## 2011-11-11 MED ORDER — FUROSEMIDE 10 MG/ML IJ SOLN
40.0000 mg | Freq: Once | INTRAMUSCULAR | Status: AC
  Administered 2011-11-11: 40 mg via INTRAVENOUS
  Filled 2011-11-11: qty 4

## 2011-11-11 MED ORDER — FUROSEMIDE 10 MG/ML IJ SOLN
INTRAMUSCULAR | Status: AC
  Filled 2011-11-11: qty 4

## 2011-11-11 NOTE — Consult Note (Signed)
Agree Imogene Burn. Georgette Dover, MD, [facility]  11/11/2011 8:30 AM

## 2011-11-11 NOTE — Progress Notes (Signed)
Subjective: Patient is still somnolent, not able to communicate.  Objective: Vital signs in last 24 hours: Filed Vitals:   11/11/11 0100 11/11/11 0200 11/11/11 0443 11/11/11 0500  BP: 149/91 140/82 140/87 129/81  Pulse: 90 85 80 79  Temp:   99.3 F (37.4 C)   TempSrc:   Oral   Resp: 21 21 21 22   Height:      Weight:      SpO2: 97% 96% 97% 97%   Weight change: 13 lb 10.2 oz (6.185 kg)  Intake/Output Summary (Last 24 hours) at 11/11/11 0810 Last data filed at 11/11/11 0500  Gross per 24 hour  Intake 3338.75 ml  Output   3350 ml  Net -11.25 ml   Physical Exam: General:  Alert, opens eyes, NAD, Patient is still not oriented. Can not answer questions  Head:  Normocephalic, atraumatic.  Neck:  No deformities, masses, or tenderness noted.Supple, No carotid Bruits, no JVD.  Lungs:   Normal respiratory effort. Clear to auscultation BL without crackles or wheezes.  Heart:  RRR. S1 and S2 normal without gallop, murmur, or rubs.  Abdomen:   BS normoactive. Soft, Nondistended, non-tender.  No masses or organomegaly.  Extremities:  No pretibial edema. DP pulse 2+  Neurologic: not oriented, speech incoherent, did not follow commands, no obvious focal deficits noted, no facial droop    Lab Results: Basic Metabolic Panel:  Lab [id number] 11/11/11 0015 11/09/11 1455  NA 144 143 --  K 3.5 5.5* --  CL 117* 115* --  CO2 18* 17* --  GLUCOSE 99 104* --  BUN 30* 29* --  CREATININE 3.07* 2.91* --  CALCIUM 10.9* 11.0* --  MG -- -- 2.0  PHOS -- -- 2.9   Liver Function Tests:  Lab 11/09/11 0710  AST 17  ALT 7  ALKPHOS 121*  BILITOT 0.2*  PROT 7.7  ALBUMIN 3.4*    Lab 11/09/11 2004  LIPASE 123*  AMYLASE --    Lab 11/09/11 0710  AMMONIA 31   CBC:  Lab 11/10/11 0350 11/09/11 0710  WBC 9.9 5.6  NEUTROABS -- 4.1  HGB 12.2* 11.2*  HCT 36.7* 34.8*  MCV 88.9 88.5  PLT 355 342   Cardiac Enzymes:  Lab 11/09/11 0710  CKTOTAL 119  CKMB 2.8  CKMBINDEX --    TROPONINI <0.30     Lab 11/09/11 0700  GLUCAP 110*   Thyroid Function Tests:  Lab 11/09/11 1455  TSH 1.237  T4TOTAL --  FREET4 --  T3FREE --  THYROIDAB --   Coagulation:  Lab 11/09/11 0710  LABPROT 13.7  INR 1.03    Drugs of Abuse     Component Value Date/Time   LABOPIA NONE DETECTED 11/09/2011 0733   LABOPIA NEGATIVE 08/10/2007 1907   COCAINSCRNUR NONE DETECTED 11/09/2011 0733   COCAINSCRNUR NEGATIVE 08/10/2007 1907   LABBENZ NONE DETECTED 11/09/2011 0733   LABBENZ NEGATIVE 08/10/2007 1907   AMPHETMU NONE DETECTED 11/09/2011 0733   AMPHETMU NEGATIVE 08/10/2007 1907   THCU NONE DETECTED 11/09/2011 0733   LABBARB NONE DETECTED 11/09/2011 0733    Alcohol Level:  Lab 11/09/11 0710  ETH <11     Micro Results: Recent Results (from the past 240 hour(s))  URINE CULTURE     Status: Normal (Preliminary result)   Collection Time   11/09/11  7:33 AM      Component Value Range Status Comment   Specimen Description URINE, CATHETERIZED   Final    Special Requests NONE   Final  Setup Time [id number]   Final    Colony Count PENDING   Incomplete    Culture Culture reincubated for better growth   Final    Report Status PENDING   Incomplete   MRSA PCR SCREENING     Status: Normal   Collection Time   11/09/11  6:49 PM      Component Value Range Status Comment   MRSA by PCR NEGATIVE  NEGATIVE  Final    Studies/Results: No results found. Medications:  Scheduled Meds:   . amLODipine  10 mg Oral Daily  . calcitonin  300 Units Intramuscular Once  . calcitonin  300 Units Intramuscular Once  . cefTRIAXone (ROCEPHIN)  IV  1 g Intravenous Q24H  . heparin subcutaneous  5,000 Units Subcutaneous Q8H  . LORazepam  0-4 mg Intravenous Q6H   Followed by  . LORazepam  0-4 mg Intravenous Q12H   Continuous Infusions:   . sodium chloride 250 mL/hr at 11/10/11 1643   Followed by  . sodium chloride 75 mL/hr at 11/10/11 2044  . DISCONTD: sodium chloride 300 mL/hr at 11/10/11  [id number]   PRN Meds:.acetaminophen, hydrALAZINE, LORazepam, LORazepam, ondansetron (ZOFRAN) IV, technetium sestamibi Assessment/Plan: .) Altered mental status -most likely due to hypercalcemia. Other differential diagnosis include, but less likely, drugs (UDS negative), alcohol intoxication (alcohol level <11), hypo-or hyperthyroidism (TSH normal), hepatic encephalopathy (no typical astaxis tremor, ammonia level normal), stroke (less likely, neutologist saw patient, did not believe diagnosis of stroke based on both clinic jugement  And image fingdings). Of note, patient had similar constellation of symptoms and presentation in October 2011 thought to be related to hypercalcemia. Patient' mental status has not improved.    A) Hypercalcemia - Secondary to primary hyperparathyroidism. 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 However, repeat scan showed residual adenoma and patient was instructed to follow up with surgery which he did not. Patient has suffered complications of hypercalcemia including altered mental status, pancreatitis, nephrolithiasis and nephrogenic diabetes insipidus. Previously treated with fluids, lasix, calcitonin, pamidronate and zoledronic acid during last admission. Patient was also seen by endocrinology while inpatient, but unknown if patient followed up as outpatient.  Since admission, pateient has been treated with aggressive IV fluid, 3 dose of total 1000 units of IV calcitonin and one dose of 90 mg IV pamidronate (aredia). Patient's calcium level is trending down to 10.9 today. But he is still not oriented. PTH level is 954.1 which is severely elevated.  Plan: -continue with aggressive hydration with NS at 250 cc/h. -Monitor input and output. If urine output  - repeated parathyroid scan. Pending reading. -talked with surgeon on the phone. Suggested to get parathyroid scan first, then order CT or MRI depending on the scan finding for surgery reference. -BMET Q4  hours to monitor Ca levels.   B) Urinary tract infection - Patient has many leukocytes, along with 21-50 WBC. As previously mentioned, history difficult to obtain, and thus unknown if patient is symptomatic. He does have altered mental status which may be contributed by UTI.   Plan: -pending Urine culture -continue Rocephin IV  C) CVA - Initially patient presented as code stroke with slurred speech and CT findings that could not exclude a small infarct. Neuro evaluated patient and found that his clinical picture did not correlate to radiographic studies, and this altered sensorium was secondary to metabolic derangement. No further studies such as Echo, dopplers, and MRI as hypercalcemia is more likely working diagnosis. Of note patient has age advanced atrophy which  makes this patient's baseline difficult to interpret. Suspect patient has baseline dementia. Dr. Leonie Man re-evaluated patient on 11/10/11, he thinks that  n further stroke workup needed. Agree with hypercalcemia diagnosis. Will sign off.    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 Patient was supposed to follow up with surgery as outpatient, but believe he did not do so. Suspect residual adenoma is causing elevation of PTH and Ca levels. His PTH is 954.1  Plan: -Management of hypercalcemia as above -Repeated parathyroid scan, pending reading. -talked with surgeon on the phone. Suggested to get parathyroid scan first, then order CT or MRI depending on the scan finding for surgery reference. -treat hypercalcemia as discussed above in 1)-A.   3.) Acute on chronic renal insufficiency - Patient has a baseline Cr between 1.7-1.9, however went up to 2.3 during last admission, thought to be secondary to diabetes insipidus due to hypercalcemia. Today it is acute on chronic likely related to dehydration and hypercalcemia. cre is trending down from 3.67 to 3.07  now.  Plan: -Monitor renal function closely -Aggressive hydration -Insert foley catheter   4.) Alcoholism - although alcohol level <11, unknown when patient's last had alcohol. Will hydrate, and place patient on CIWA protocol.   5.) History of pancreatitis - with multiple prior admissions. Although patient does not complain of abdominal pain at this time. His lipase is elevated (123) today.  - will keep patient NPO.   -continue IV fluid.  6.) Schizophrenia - was discharged on zyprexa during last hospitalization. Unknown and unlikely that patient is taking. There was a trazodone bottle present at bedside.  Plan: -Continue to monitor -Haldol prn agitation -Consider restarting Zyprexa when medically stable   7.) Hypertension -Bp is 129/81,  previously on Norvasc, but med rec shows patient on unknown dose of verapamil.   Plan:   -Continue to monitor -Norvasc 10 mg po  daily         LOS: 2 days   Ivor Costa 11/11/2011, 8:10 AM

## 2011-11-11 NOTE — Progress Notes (Signed)
Internal Medicine Teaching Service Attending Note Date: 11/11/2011  Patient name: [name]  Medical record number: [id number]  Date of birth: November 10, 1946    This patient has been seen and discussed with the house staff. Please see their note for complete details. I concur with their findings with the following additions/corrections:  By the time we rounded nurse noted the patient had awoken and been completely oriented. Ca++ down to 10-11 range. Surgery does not want to operate on him in the hospital but I am concerned if he goes out he will again not follow up. I wonder if we could consider short term, low dose bisphosphonate with his CKD. For example possibly fosamax 35mg  a week could be considered. He may not be able to afford this however.  WOODYEAR,WYNNE E 11/11/2011, 12:30 PM

## 2011-11-12 ENCOUNTER — Inpatient Hospital Stay (HOSPITAL_COMMUNITY): Payer: Medicaid Other

## 2011-11-12 LAB — URINE CULTURE
Colony Count: >=100000
Culture  Setup Time: 201212121017

## 2011-11-12 LAB — BASIC METABOLIC PANEL
Calcium: 12 mg/dL — ABNORMAL HIGH (ref 8.4–10.5)
GFR calc Af Amer: 21 mL/min — ABNORMAL LOW (ref >90)
GFR calc non Af Amer: 18 mL/min — ABNORMAL LOW (ref >90)
Sodium: 145 mEq/L (ref 135–145)

## 2011-11-12 LAB — MAGNESIUM: Magnesium: 1.7 mg/dL (ref 1.5–2.5)

## 2011-11-12 LAB — GLUCOSE, CAPILLARY

## 2011-11-12 MED ORDER — CHLORHEXIDINE GLUCONATE 0.12 % MT SOLN
15.0000 mL | Freq: Two times a day (BID) | OROMUCOSAL | Status: DC
  Administered 2011-11-12: 15 mL via OROMUCOSAL
  Filled 2011-11-12 (×3): qty 15

## 2011-11-12 MED ORDER — BIOTENE DRY MOUTH MT LIQD: 15.0000 mL | Freq: Two times a day (BID) | OROMUCOSAL | Status: DC

## 2011-11-12 MED ORDER — SODIUM CHLORIDE 0.9 % IV SOLN
INTRAVENOUS | Status: DC
  Administered 2011-11-12: 11:00:00 via INTRAVENOUS

## 2011-11-12 MED ORDER — INFLUENZA VIRUS VACC SPLIT PF IM SUSP
0.5000 mL | INTRAMUSCULAR | Status: AC
  Administered 2011-11-13: 0.5 mL via INTRAMUSCULAR
  Filled 2011-11-12: qty 0.5

## 2011-11-12 MED ORDER — PNEUMOCOCCAL VAC POLYVALENT 25 MCG/0.5ML IJ INJ
0.5000 mL | INJECTION | INTRAMUSCULAR | Status: AC
  Administered 2011-11-13: 0.5 mL via INTRAMUSCULAR
  Filled 2011-11-12: qty 0.5

## 2011-11-12 NOTE — Progress Notes (Signed)
Subjective:    There were no events overnight. Currently, the patient has no acute complaints. He denies symptoms of chest pressure/discomfort, dyspnea, orthopnea and paroxysmal nocturnal dyspnea, chills, fevers. States he is hungry and ready to eat!    Objective:    Vital Signs:   Temp:  [97.4 F (36.3 C)-98.5 F (36.9 C)] 98.1 F (36.7 C) (12/15 0815) Pulse Rate:  [64-78] 65  (12/15 0815) Resp:  [13-24] 13  (12/15 0815) BP: (109-161)/(71-92) 109/71 mmHg (12/15 0815) SpO2:  [94 %-98 %] 96 % (12/15 0815) Weight:  [158 lb 8.2 oz (71.9 kg)] 158 lb 8.2 oz (71.9 kg) (12/15 0030) Last BM Date: 11/11/11  24-hour weight change: Weight change: -12 lb 2 oz (-5.5 kg)  Intake/Output:   Intake/Output Summary (Last 24 hours) at 11/12/11 0817 Last data filed at 11/12/11 0816  Gross per 24 hour  Intake     10 ml  Output   2400 ml  Net  -2390 ml      Physical Exam: General: Vital signs reviewed and noted. Well-developed, well-nourished, in no acute distress; alert, appropriate and cooperative throughout examination. AAO.   Lungs:  Normal respiratory effort. Clear to auscultation BL without crackles or wheezes.  Heart: RRR. S1 and S2 normal without gallop, murmur, or rubs.  Abdomen:  BS normoactive. Soft, Nondistended, non-tender.  No masses or organomegaly.  Extremities: No pretibial edema.     Labs:  Basic Metabolic Panel:  Lab [id number] 0500 11/11/11 0342 11/11/11 0015 11/10/11 1917 11/10/11 1311 11/09/11 1455  NA 145 144 143 143 145 --  K 3.4* 3.5 5.5* 3.4* 3.5 --  CL 116* 117* 115* 114* 116* --  CO2 19 18* 17* 16* 18* --  GLUCOSE 101* 99 104* 102* 136* --  BUN 40* 30* 29* 29* 30* --  CREATININE 3.38* 3.07* 2.91* 2.92* 2.93* --  CALCIUM 12.0* 10.9* 11.0* -- -- --  MG -- -- -- -- -- 2.0  PHOS -- -- -- -- -- 2.9    Liver Function Tests:  Lab 11/09/11 0710  AST 17  ALT 7  ALKPHOS 121*  BILITOT 0.2*  PROT 7.7  ALBUMIN 3.4*    Lab 11/09/11 2004  LIPASE 123*    AMYLASE --    Lab 11/09/11 0710  AMMONIA 31    CBC:  Lab 11/10/11 0350 11/09/11 0710  WBC 9.9 5.6  NEUTROABS -- 4.1  HGB 12.2* 11.2*  HCT 36.7* 34.8*  MCV 88.9 88.5  PLT 355 342    Cardiac Enzymes:  Lab 11/09/11 0710  CKTOTAL 119  CKMB 2.8  CKMBINDEX --  TROPONINI <0.30    CBG:  Lab 11/09/11 0700  GLUCAP 110*    Microbiology: Results for orders placed during the hospital encounter of 11/09/11  URINE CULTURE     Status: Normal   Collection Time   11/09/11  7:33 AM      Component Value Range Status Comment   Specimen Description URINE, CATHETERIZED   Final    Special Requests NONE   Final    Setup Time 201212121017   Final    Colony Count >=100,000 COLONIES/ML   Final    Culture ENTEROBACTER CLOACAE   Final    Report Status 11/12/2011 FINAL   Final    Organism ID, Bacteria ENTEROBACTER CLOACAE   Final   MRSA PCR SCREENING     Status: Normal   Collection Time   11/09/11  6:49 PM      Component Value Range Status  Comment   MRSA by PCR NEGATIVE  NEGATIVE  Final     Imaging:  Ct Chest Wo Contrast (11/11/2011) - 1.  High right paratracheal soft tissue nodule presumably corresponds to the patient's known parathyroid adenoma, and is increased in size from 09/08/2010. 2.  Tiny left pleural effusion. 3.  Cholelithiasis.    Nm Parathyroid Scan W/spect (11/11/2011) - Persistent inferior right parathyroid adenoma located in the right paratracheal region.      Medications:    Infusions:    . DISCONTD: sodium chloride 75 mL/hr at 11/10/11 2044    Scheduled Medications:    . amLODipine  10 mg Oral Daily  . cefTRIAXone (ROCEPHIN)  IV  1 g Intravenous Q24H  . furosemide      . furosemide  40 mg Intravenous Once  . heparin subcutaneous  5,000 Units Subcutaneous Q8H  . DISCONTD: LORazepam  0-4 mg Intravenous Q6H  . DISCONTD: LORazepam  0-4 mg Intravenous Q12H    PRN Medications: acetaminophen, hydrALAZINE, ondansetron (ZOFRAN) IV, DISCONTD: LORazepam,  DISCONTD: LORazepam   Assessment/ Plan:    Pt is a 65 y.o. yo male with a PMHx of primary hyperparathyroidism in the setting of known parathyroid adenoma, which was previously resected with residual adenoma for which he never followed up. He was admitted on 11/09/2011 with symptoms of altered mental status thought to be multifactorial, mostly contributed by his profound hypercalcemia with admit ca of >15, alcohol abuse.  1. Altered mental status - seems back to baseline. Likely multifactorial. On admission, largely contributed by his severe hypercalcemia with admission ca of >15 and acute infection (UTI). MRI did confirm stroke, although not localized to a region that would be expected to have this profound effect on his mentation. Likely also has a baseline level of cognitive impairment in the setting of his alcohol abuse.   Continue maintenance fluids, and advance diet after swallow eval at bedside  Completed course for UTI  2. Hypercalcemia in setting of primary hyperparathyroidism - intact PTH level of 954.1 despite Ca > 15. Since admission, pateient has been treated with aggressive IV fluid, total 1000 units of IV calcitonin and one dose of 90 mg IV pamidronate (aredia). Patient's calcium level is slowly rising to 12 today, likely with his oral intake held secondary to AMS, and his fluids decreased yesterday.   Provide maintenance fluids, advance diet.  Spoke with Dr. Buddy Duty (endocrinology) yesterday, who recommended start once weekly low dose alendronate on discharge (with one week between alendronate and pamidronate).  Surgery has been consulted - who recommend outpatient surgery - however, our team is concerned that the patient will again be lost to follow-up if plan is only for outpatient surgery.  3. CVA - CT head on 12/12 showing posterior superior aspect of the posterior limb of the right internal capsule small acute infarct not excluded. Neurology service initially involved, but did  not feel that this clinically correlates with affected area described. Therefore, no further stroke w/u recommended.   4. 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 Cr on admission 3.67, was slowly downtrending with a trough of 2.9. However, now continues to uptrend, possibly again the DI, volume depletion?  Continue Foley  As above, increase IVF and oral intake.  Check renal ultrasound - especially given worsening renal insufficiency, hx of nephrolithiasis requiring lithotripsy, and presenting hypercalcemia.  5. Alcoholism -  although alcohol level <11, unknown when patient's last had alcohol. Will hydrate, and place patient on CIWA protocol.   6. Schizophrenia - was discharged on zyprexa during last hospitalization. Unknown and unlikely that patient is taking. There was a trazodone bottle present at bedside.  Restart Zyprexa as outpatient.                                                     7. Hypertension - well controlled. Continue current.  8. UTI - UCx growing Enterobacter, sensitive to Rocephin. Has completed 3 day course of Abx. DC Rocephin.  9. Dispo - will transfer him out of SDU.   Length of Stay: [place], Vernell Barrier, Internal Medicine Resident 11/12/2011, 8:17 AM

## 2011-11-13 LAB — BASIC METABOLIC PANEL
Calcium: 11.1 mg/dL — ABNORMAL HIGH (ref 8.4–10.5)
Chloride: 112 mEq/L (ref 96–112)
Creatinine, Ser: 2.95 mg/dL — ABNORMAL HIGH (ref 0.50–1.35)
GFR calc Af Amer: 24 mL/min — ABNORMAL LOW (ref >90)
GFR calc non Af Amer: 21 mL/min — ABNORMAL LOW (ref >90)

## 2011-11-13 MED ORDER — POTASSIUM CHLORIDE CRYS ER 20 MEQ PO TBCR
40.0000 meq | EXTENDED_RELEASE_TABLET | Freq: Once | ORAL | Status: AC
  Administered 2011-11-13: 40 meq via ORAL
  Administered 2011-11-14: 10:00:00 via ORAL
  Filled 2011-11-13: qty 2

## 2011-11-13 NOTE — Progress Notes (Signed)
Subjective:  There were no events overnight. Patient's mental status is normal now. Currently, the patient has no acute complaints. He denies symptoms of chest pressure/discomfort, dyspnea, orthopnea and paroxysmal nocturnal dyspnea, chills, fevers.   Objective: Vital signs in last 24 hours: Filed Vitals:   11/12/11 1515 11/12/11 1606 11/12/11 2100 11/13/11 0500  BP: 121/79 127/81 150/90 135/79  Pulse: 68 66 66 63  Temp: 98 F (36.7 C) 97.9 F (36.6 C) 97.9 F (36.6 C) 98.4 F (36.9 C)  TempSrc: Oral Oral Oral Oral  Resp: 14 18 18 18   Height:      Weight:      SpO2:  100% 100% 99%   Weight change:   Intake/Output Summary (Last 24 hours) at 11/13/11 0843 Last data filed at 11/13/11 0500  Gross per 24 hour  Intake   1040 ml  Output   1352 ml  Net   -312 ml   Physical Exam:  Physical Exam: General:  Vital signs reviewed and noted. Well-developed, well-nourished, in no acute distress; alert, appropriate and cooperative throughout examination. AAO.    Lungs:   Normal respiratory effort. Clear to auscultation BL without crackles or wheezes.   Heart:  RRR. S1 and S2 normal without gallop, murmur, or rubs.   Abdomen:   BS normoactive. Soft, Nondistended, non-tender.  No masses or organomegaly.   Extremities:  No pretibial edema.    Lab Results: Basic Metabolic Panel:  Lab [id number] 11/12/11 0500 11/09/11 1455  NA 139 145 --  K 3.4* 3.4* --  CL 112 116* --  CO2 16* 19 --  GLUCOSE 131* 101* --  BUN 37* 40* --  CREATININE 2.95* 3.38* --  CALCIUM 11.1* 12.0* --  MG -- 1.7 2.0  PHOS -- -- 2.9   Liver Function Tests:  Lab 11/09/11 0710  AST 17  ALT 7  ALKPHOS 121*  BILITOT 0.2*  PROT 7.7  ALBUMIN 3.4*    Lab 11/09/11 2004  LIPASE 123*  AMYLASE --    Lab 11/09/11 0710  AMMONIA 31   CBC:  Lab 11/10/11 0350 11/09/11 0710  WBC 9.9 5.6  NEUTROABS -- 4.1  HGB 12.2* 11.2*  HCT 36.7* 34.8*  MCV 88.9 88.5  PLT 355 342   Cardiac Enzymes:  Lab 11/09/11  0710  CKTOTAL 119  CKMB 2.8  CKMBINDEX --  TROPONINI <0.30     Lab 11/12/11 0808 11/09/11 0700  GLUCAP 87 110*     Lab 11/09/11 1455  TSH 1.237  T4TOTAL --  FREET4 --  T3FREE --  THYROIDAB --   Coagulation:  Lab 11/09/11 0710  LABPROT 13.7  INR 1.03   Anemia Panel: No results found for this basename: VITAMINB12,FOLATE,FERRITIN,TIBC,IRON,RETICCTPCT in the last 168 hours Urine Drug Screen: Drugs of Abuse     Component Value Date/Time   LABOPIA NONE DETECTED 11/09/2011 [place] 08/10/2007 1907   COCAINSCRNUR NONE DETECTED 11/09/2011 0733   COCAINSCRNUR NEGATIVE 08/10/2007 1907   LABBENZ NONE DETECTED 11/09/2011 0733   LABBENZ NEGATIVE 08/10/2007 1907   AMPHETMU NONE DETECTED 11/09/2011 0733   AMPHETMU NEGATIVE 08/10/2007 1907   THCU NONE DETECTED 11/09/2011 0733   LABBARB NONE DETECTED 11/09/2011 0733    Alcohol Level:  Lab 11/09/11 0710  ETH <11    Micro Results: Recent Results (from the past 240 hour(s))  URINE CULTURE     Status: Normal   Collection Time   11/09/11  7:33 AM      Component Value Range  Status Comment   Specimen Description URINE, CATHETERIZED   Final    Special Requests NONE   Final    Setup Time [id number]   Final    Colony Count >=100,000 COLONIES/ML   Final    Culture ENTEROBACTER CLOACAE   Final    Report Status 11/12/2011 FINAL   Final    Organism ID, Bacteria ENTEROBACTER CLOACAE   Final   MRSA PCR SCREENING     Status: Normal   Collection Time   11/09/11  6:49 PM      Component Value Range Status Comment   MRSA by PCR NEGATIVE  NEGATIVE  Final    Studies/Results: Ct Chest Wo Contrast  11/11/2011  *RADIOLOGY REPORT*  Clinical Data: Parathyroid adenoma.  CT CHEST WITHOUT CONTRAST  Technique:  Multidetector CT imaging of the chest was performed following the standard protocol without IV contrast.  Comparison: 09/08/2010.  Findings: A sub centimeter low attenuation lesion in the right lobe of the thyroid is again  noted.  A soft tissue nodule in the high right paratracheal station measures approximately 1.3 x 2.7 cm (previously 0.8 x 2.5 cm).  Hilar regions are difficult to definitively evaluate without IV contrast.  No axillary adenopathy. Heart size normal.  No pericardial effusion.  Tiny left pleural effusion.  Atelectasis in both lower lobes. Airway is unremarkable.  Incidental imaging of the upper abdomen shows a stone and high density material in the gallbladder.  No worrisome lytic or sclerotic lesions.    IMPRESSION:  1.  High right paratracheal soft tissue nodule presumably corresponds to the patient's known parathyroid adenoma, and is increased in size from 09/08/2010. 2.  Tiny left pleural effusion. 3.  Cholelithiasis.  Original Report Authenticated By: Luretha Rued, M.D.   US Renal  11/12/2011  *RADIOLOGY REPORT*  Clinical Data:  Acute on chronic renal insufficiency  RENAL/URINARY TRACT ULTRASOUND COMPLETE  Comparison:  08/15/2010 .  Findings:  Right Kidney:  Measures 9.4 cm. Diffuse increased cortical echogenicity.  No mass or hydronephrosis.  To stones are seen within the inferior pole measuring up to 9 mm.  There is a cyst also within the upper pole measuring 1 cm.  Left Kidney:  Measures 9.3 cm. Increase in cortical echogenicity. No mass or hydronephrosis.  Nonobstructing 7 mm calculus is noted within the inferior pole.  Bladder:  Collapsed around a Foley catheter balloon.  IMPRESSION:  1.  No hydronephrosis. 2.  Bilateral echogenic kidneys. 3.  Bilateral nonobstructing renal calculi.  Original Report Authenticated By: Angelita Ingles, M.D.   Medications:  Scheduled Meds:   . amLODipine  10 mg Oral Daily  . heparin subcutaneous  5,000 Units Subcutaneous Q8H  . influenza  inactive virus vaccine  0.5 mL Intramuscular Tomorrow-1000  . pneumococcal 23 valent vaccine  0.5 mL Intramuscular Tomorrow-1000  . potassium chloride  40 mEq Oral Once  . DISCONTD: antiseptic oral rinse  15 mL Mouth  Rinse q12n4p  . DISCONTD: cefTRIAXone (ROCEPHIN)  IV  1 g Intravenous Q24H  . DISCONTD: chlorhexidine  15 mL Mouth/Throat BID   Continuous Infusions:   . sodium chloride 75 mL/hr at 11/13/11 0500   PRN Meds:.acetaminophen, hydrALAZINE, ondansetron (ZOFRAN) IV Assessment/Plan:  Pt is a 65 y.o. yo male with a PMHx of primary hyperparathyroidism in the setting of known parathyroid adenoma, which was previously resected with residual adenoma for which he never followed up. He was admitted on 11/09/2011 with symptoms of altered mental status thought to be multifactorial, mostly contributed  by his profound hypercalcemia with admit ca of >15, alcohol abuse.  1. Altered mental status - seems back to baseline. Likely multifactorial. On admission, largely contributed by his severe hypercalcemia with admission ca of >15 and acute infection (UTI). MRI did confirm stroke, although not localized to a region that would be expected to have this profound effect on his mentation. Likely also has a baseline level of cognitive impairment in the setting of his alcohol abuse.    Continue maintenance fluids, and advance diet after swallow eval at bedside   Completed course for UTI   2. Hypercalcemia in setting of primary hyperparathyroidism - intact PTH level of 954.1 despite Ca > 15. Since admission, pateient has been treated with aggressive IV fluid, total 1000 units of IV calcitonin and one dose of 90 mg IV pamidronate (aredia) on 11/09/11. Patient's calcium level is slowly  Decreasing to 11.1 today, likely with his oral intake held secondary to AMS, and his fluids decreased yesterday.  US-renal shows: o hydronephrosis. 2.  Bilateral echogenic kidneys. 3.  Bilateral nonobstructing renal calculi.  Provide maintenance fluids, advance diet. Today Ca is 11.1, expecting it will lower further. If not will give calcitonin.   Spoke with Dr. Buddy Duty (endocrinology) yesterday, who recommended start once weekly low dose  alendronate (70 mg/week) on discharge (with one week between alendronate and pamidronate).   Surgery has been consulted - who recommend outpatient surgery - however, our team is concerned that the patient will again be lost to follow-up if plan is only for outpatient surgery.  3. CVA - CT head on 12/12 showing posterior superior aspect of the posterior limb of the right internal capsule small acute infarct not excluded. Neurology service initially involved, but did not feel that this clinically correlates with affected area described. Therefore, no further stroke w/u recommended.   4. 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 Cr on admission 3.67, was slowly downtrending with a trough of 2.9. Today cre is 2.95.  However, now continues to uptrend, possibly again the DI, volume depletion?  Continue Foley   As above, increase IVF and oral intake.   US-renal shows: No hydronephrosis. 2.  Bilateral echogenic kidneys. 3.  Bilateral nonobstructing renal calculi. Will observe closely.    5. Alcoholism - although alcohol level <11, unknown when patient's last had alcohol. Will hydrate, and place patient on CIWA protocol.   6. Schizophrenia - was discharged on zyprexa during last hospitalization. Unknown and unlikely that patient is taking. There was a trazodone bottle present at bedside.  Restart Zyprexa as outpatient.                                                      7. Hypertension - well controlled. Continue current.  8. UTI - UCx growing Enterobacter, sensitive to Rocephin. Has completed 3 day course of Abx. DC-ed Rocephin.  9. Dispo - will transfer him out of SDU.    LOS: 4 days   Ivor Costa 11/13/2011, 8:43 AM

## 2011-11-14 DIAGNOSIS — D351 Benign neoplasm of parathyroid gland: Secondary | ICD-10-CM | POA: Diagnosis present

## 2011-11-14 LAB — BASIC METABOLIC PANEL
CO2: 18 mEq/L — ABNORMAL LOW (ref 19–32)
Calcium: 11.2 mg/dL — ABNORMAL HIGH (ref 8.4–10.5)
GFR calc Af Amer: 26 mL/min — ABNORMAL LOW (ref >90)
GFR calc non Af Amer: 23 mL/min — ABNORMAL LOW (ref >90)
Sodium: 137 mEq/L (ref 135–145)

## 2011-11-14 MED ORDER — AMLODIPINE BESYLATE 10 MG PO TABS: 10.0000 mg | ORAL_TABLET | Freq: Every day | ORAL | Status: DC

## 2011-11-14 MED ORDER — ALENDRONATE SODIUM 70 MG PO TABS: 70.0000 mg | ORAL_TABLET | ORAL | Status: DC

## 2011-11-14 MED ORDER — POTASSIUM CHLORIDE CRYS ER 20 MEQ PO TBCR
40.0000 meq | EXTENDED_RELEASE_TABLET | Freq: Once | ORAL | Status: AC
  Administered 2011-11-14: 40 meq via ORAL
  Filled 2011-11-14: qty 2

## 2011-11-14 MED ORDER — ACETAMINOPHEN 650 MG RE SUPP: 650.0000 mg | Freq: Four times a day (QID) | RECTAL | Status: DC | PRN

## 2011-11-14 MED ORDER — POTASSIUM CHLORIDE CRYS ER 20 MEQ PO TBCR
EXTENDED_RELEASE_TABLET | ORAL | Status: AC
  Filled 2011-11-14: qty 2

## 2011-11-14 MED ORDER — ACETAMINOPHEN 650 MG RE SUPP: 650.0000 mg | Freq: Four times a day (QID) | RECTAL | Status: AC | PRN

## 2011-11-14 NOTE — Progress Notes (Signed)
ADDENDUM: Discussed with attending, Dr. Marlou Starks. Recommend discharging patient and follow up with Dr. Hassell Done of [place]. Encouarge compliance with treatment plan and follow up as directed.  Subjective: Pt seen. NO c/o  Objective: Vital signs in last 24 hours: Temp:  [98.1 F (36.7 C)-98.4 F (36.9 C)] 98.1 F (36.7 C) (12/17 0500) Pulse Rate:  [64-69] 69  (12/17 0500) Resp:  [18] 18  (12/17 0500) BP: (156-163)/(85-95) 156/90 mmHg (12/17 0500) SpO2:  [98 %-100 %] 100 % (12/17 0500) Weight:  [159 lb 2.8 oz (72.2 kg)] 159 lb 2.8 oz (72.2 kg) (12/17 0500) Last BM Date: 11/13/11  Intake/Output this shift:    Physical Exam: BP 156/90  Pulse 69  Temp(Src) 98.1 F (36.7 C) (Oral)  Resp 18  Ht 5\' 9"  (1.753 m)  Wt 159 lb 2.8 oz (72.2 kg)  BMI 23.51 kg/m2  SpO2 100% NEck: unremarkable  Labs: CBC No results found for this basename: WBC:2,HGB:2,HCT:2,PLT:2 in the last 72 hours BMET  Basename 11/14/11 0042 11/13/11 0521  NA 137 139  K 3.4* 3.4*  CL 110 112  CO2 18* 16*  GLUCOSE 123* 131*  BUN 29* 37*  CREATININE 2.75* 2.95*  CALCIUM 11.2* 11.1*   LFT No results found for this basename: PROT,ALBUMIN,AST,ALT,ALKPHOS,BILITOT,BILIDIR,IBILI,LIPASE in the last 72 hours PT/INR No results found for this basename: LABPROT:2,INR:2 in the last 72 hours ABG No results found for this basename: PHART:2,PCO2:2,PO2:2,HCO3:2 in the last 72 hours  Studies/Results: US Renal  11/12/2011  *RADIOLOGY REPORT*  Clinical Data:  Acute on chronic renal insufficiency  RENAL/URINARY TRACT ULTRASOUND COMPLETE  Comparison:  08/15/2010 .  Findings:  Right Kidney:  Measures 9.4 cm. Diffuse increased cortical echogenicity.  No mass or hydronephrosis.  To stones are seen within the inferior pole measuring up to 9 mm.  There is a cyst also within the upper pole measuring 1 cm.  Left Kidney:  Measures 9.3 cm. Increase in cortical echogenicity. No mass or hydronephrosis.  Nonobstructing 7 mm calculus is noted within  the inferior pole.  Bladder:  Collapsed around a Foley catheter balloon.  IMPRESSION:  1.  No hydronephrosis. 2.  Bilateral echogenic kidneys. 3.  Bilateral nonobstructing renal calculi.  Original Report Authenticated By: Angelita Ingles, M.D.    Assessment: Principal Problem:  *Hypercalcemia Active Problems:  Primary hyperparathyroidism  Schizophrenia  Hypertension  Alcohol abuse  Altered mental status  UTI (lower urinary tract infection)  Acute on chronic renal insufficiency (R)inf parathyroid adenoma  Plan: Will D/W MD if there is time/schedule availability to do parathyroidectomy this week. There is concern the pt will not be compliant with outpt follow up.  LOS: 5 days    Federico Flake 11/14/2011

## 2011-11-14 NOTE — Plan of Care (Signed)
Problem: Phase I Progression Outcomes Goal: Voiding-avoid urinary catheter unless indicated Outcome: Not Progressing Pt still has foley cath for strict I/O

## 2011-11-14 NOTE — Progress Notes (Signed)
Subjective:  There were no events overnight. Patient's mental status is normal now.  Objective: Vital signs in last 24 hours: Filed Vitals:   11/13/11 0500 11/13/11 1521 11/13/11 2100 11/14/11 0500  BP: 135/79 163/95 156/85 156/90  Pulse: 63 64 69 69  Temp: 98.4 F (36.9 C) 98.4 F (36.9 C) 98.3 F (36.8 C) 98.1 F (36.7 C)  TempSrc: Oral Oral Oral Oral  Resp: 18 18 18 18   Height:      Weight:    159 lb 2.8 oz (72.2 kg)  SpO2: 99% 100% 98% 100%   Weight change:   Intake/Output Summary (Last 24 hours) at 11/14/11 0948 Last data filed at 11/14/11 0500  Gross per 24 hour  Intake      0 ml  Output   2750 ml  Net  -2750 ml   Physical Exam: Physical Exam: General:   Vital signs reviewed and noted. Well-developed, well-nourished, in no acute distress; alert, appropriate and cooperative throughout examination. AAO.     Lungs:    Normal respiratory effort. Clear to auscultation BL without crackles or wheezes.    Heart:   RRR. S1 and S2 normal without gallop, murmur, or rubs.    Abdomen:    BS normoactive. Soft, Nondistended, non-tender.  No masses or organomegaly.    Extremities:   No pretibial edema.     Basic Metabolic Panel:  Lab [id number] 0042 11/13/11 0521 11/12/11 0500 11/09/11 1455  NA 137 139 -- --  K 3.4* 3.4* -- --  CL 110 112 -- --  CO2 18* 16* -- --  GLUCOSE 123* 131* -- --  BUN 29* 37* -- --  CREATININE 2.75* 2.95* -- --  CALCIUM 11.2* 11.1* -- --  MG -- -- 1.7 2.0  PHOS -- -- -- 2.9   Liver Function Tests:  Lab 11/09/11 0710  AST 17  ALT 7  ALKPHOS 121*  BILITOT 0.2*  PROT 7.7  ALBUMIN 3.4*    Lab 11/09/11 2004  LIPASE 123*  AMYLASE --    Lab 11/09/11 0710  AMMONIA 31   CBC:  Lab 11/10/11 0350 11/09/11 0710  WBC 9.9 5.6  NEUTROABS -- 4.1  HGB 12.2* 11.2*  HCT 36.7* 34.8*  MCV 88.9 88.5  PLT 355 342   Cardiac Enzymes:  Lab 11/09/11 0710  CKTOTAL 119  CKMB 2.8  CKMBINDEX --  TROPONINI <0.30     Lab 11/12/11 0808 11/09/11  0700  GLUCAP 87 110*   Thyroid Function Tests:  Lab 11/09/11 1455  TSH 1.237  T4TOTAL --  FREET4 --  T3FREE --  THYROIDAB --   Coagulation:  Lab 11/09/11 0710  LABPROT 13.7  INR 1.03    Drugs of Abuse     Component Value Date/Time   LABOPIA NONE DETECTED 11/09/2011 0733   LABOPIA NEGATIVE 08/10/2007 1907   COCAINSCRNUR NONE DETECTED 11/09/2011 0733   COCAINSCRNUR NEGATIVE 08/10/2007 1907   LABBENZ NONE DETECTED 11/09/2011 0733   LABBENZ NEGATIVE 08/10/2007 1907   AMPHETMU NONE DETECTED 11/09/2011 0733   AMPHETMU NEGATIVE 08/10/2007 1907   THCU NONE DETECTED 11/09/2011 0733   LABBARB NONE DETECTED 11/09/2011 0733    Alcohol Level:  Lab 11/09/11 0710  ETH <11     Micro Results: Recent Results (from the past 240 hour(s))  URINE CULTURE     Status: Normal   Collection Time   11/09/11  7:33 AM      Component Value Range Status Comment   Specimen Description URINE, CATHETERIZED  Final    Special Requests NONE   Final    Setup Time 201212121017   Final    Colony Count >=100,000 COLONIES/ML   Final    Culture ENTEROBACTER CLOACAE   Final    Report Status 11/12/2011 FINAL   Final    Organism ID, Bacteria ENTEROBACTER CLOACAE   Final   MRSA PCR SCREENING     Status: Normal   Collection Time   11/09/11  6:49 PM      Component Value Range Status Comment   MRSA by PCR NEGATIVE  NEGATIVE  Final    Studies/Results: US Renal  11/12/2011  *RADIOLOGY REPORT*  Clinical Data:  Acute on chronic renal insufficiency  RENAL/URINARY TRACT ULTRASOUND COMPLETE  Comparison:  08/15/2010 .  Findings:  Right Kidney:  Measures 9.4 cm. Diffuse increased cortical echogenicity.  No mass or hydronephrosis.  To stones are seen within the inferior pole measuring up to 9 mm.  There is a cyst also within the upper pole measuring 1 cm.  Left Kidney:  Measures 9.3 cm. Increase in cortical echogenicity. No mass or hydronephrosis.  Nonobstructing 7 mm calculus is noted within the inferior pole.   Bladder:  Collapsed around a Foley catheter balloon.  IMPRESSION:  1.  No hydronephrosis. 2.  Bilateral echogenic kidneys. 3.  Bilateral nonobstructing renal calculi.  Original Report Authenticated By: Angelita Ingles, M.D.   Medications:  Scheduled Meds:   . amLODipine  10 mg Oral Daily  . heparin subcutaneous  5,000 Units Subcutaneous Q8H  . influenza  inactive virus vaccine  0.5 mL Intramuscular Tomorrow-1000  . pneumococcal 23 valent vaccine  0.5 mL Intramuscular Tomorrow-1000  . potassium chloride  40 mEq Oral Once   Continuous Infusions:   . sodium chloride 75 mL/hr at 11/13/11 1000   PRN Meds:.acetaminophen, ondansetron (ZOFRAN) IV Assessment/Plan:  Pt is a 65 y.o. yo male with a PMHx of primary hyperparathyroidism in the setting of known parathyroid adenoma, which was previously resected with residual adenoma for which he never followed up. He was admitted on 11/09/2011 with symptoms of altered mental status thought to be multifactorial, mostly contributed by his profound hypercalcemia with admit ca of >15, alcohol abuse.  1. Altered mental status - seems back to baseline. Likely multifactorial. On admission, largely contributed by his severe hypercalcemia with admission ca of >15 and acute infection (UTI). MRI did confirm stroke, although not localized to a region that would be expected to have this profound effect on his mentation. Likely also has a baseline level of cognitive impairment in the setting of his alcohol abuse.    Continue maintenance fluids, and advance diet after swallow eval at bedside     Completed course for UTI  2. Hypercalcemia in setting of primary hyperparathyroidism - intact PTH level of 954.1 despite Ca > 15. Since admission, pateient has been treated with aggressive IV fluid, total 1000 units of IV calcitonin and one dose of 90 mg IV pamidronate (aredia) on 11/09/11. Patient's calcium level is slowly  Decreasing to 11.2 today, likely with his oral intake  held secondary to AMS, and his fluids decreased yesterday.  US-renal shows: No hydronephrosis. 2.  Bilateral echogenic kidneys. 3. Bilateral nonobstructing renal calculi.  Provide maintenance fluids, advance diet. Today Ca is 11.2, expecting it will lower further. If not will give calcitonin.     Spoke with Dr. Buddy Duty (endocrinology) yesterday, who recommended start once weekly low dose alendronate (70 mg/week) on discharge (with one week between alendronate and  pamidronate).     Surgery has been consulted - who recommend outpatient surgery - however, our team is concerned that the patient will again be lost to follow-up if plan is only for outpatient surgery. Will discuss with surgeon again today.  3. CVA - CT head on 12/12 showing posterior superior aspect of the posterior limb of the right internal capsule small acute infarct not excluded. Neurology service initially involved, but did not feel that this clinically correlates with affected area described. Therefore, no further stroke w/u recommended.   4. 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 Cr on admission 3.67, was slowly downtrending with a trough of 2.9. Today cre is 2.75.  However, now continues to uptrend, possibly again the DI, volume depletion?  Continue Foley     As above, increase IVF and oral intake.     US-renal shows: No hydronephrosis. 2.  Bilateral echogenic kidneys. 3.  Bilateral nonobstructing renal calculi. Will observe closely.   5. Alcoholism - although alcohol level <11, unknown when patient's last had alcohol. Will hydrate, and place patient on CIWA protocol.   6. Schizophrenia - was discharged on zyprexa during last hospitalization. Unknown and unlikely that patient is taking. There was a trazodone bottle present at bedside.  Restart Zyprexa as outpatient.                                                       7. Hypertension - well controlled. Continue current.  8. UTI - UCx growing Enterobacter, sensitive to Rocephin. Has completed 3 day course of Abx. DC-ed Rocephin.  9. Dispo - will transfer him out of SDU.         LOS: 5 days   Ivor Costa 11/14/2011, 9:48 AM

## 2011-11-14 NOTE — Discharge Summary (Signed)
Patient Name:  [name]  MRN: [id number]  PCP: No primary provider on file.  DOB:  1946/01/19   CSN: [id number]     Date of Admission:  11/09/2011  Date of Discharge:  11/14/2011      Attending Physician: Dr. Nance Pew MD, [facility]        DISCHARGE DIAGNOSES: Principal Problem:  *Hypercalcemia Active Problems:  Primary hyperparathyroidism  Schizophrenia  Hypertension  Alcohol abuse  Altered mental status  UTI (lower urinary tract infection)  Acute on chronic renal insufficiency  Parathyroid adenoma   DISPOSITION AND FOLLOW-UP:  [name] is to follow-up with the listed providers as detailed below. In this visit, please check his blood calcium level.  Follow-up Information    Follow up with KERR,JEFFREY on 11/15/2011. (at  2:20 PM)    Contact information:   [address] [place] [facility] [place] [phone]       Follow up with Maia Petties., MD on 12/08/2011. (at 9:20 AM - Arrive NO LATER than 8:45AM so that you can be registered,. Bring your insurance card and information with you.)    Contact information:   [facility] [address], [address] [place] [phone]         Discharge Orders    Future Appointments: Provider: Department: Dept Phone: Center:   12/08/2011 9:20 AM Imogene Burn. Tsuei, MD [facility] [phone] None     Future Orders Please Complete By Expires   Diet general      Diet general      Increase activity slowly      Call MD for:  difficulty breathing, headache or visual disturbances      Call MD for:  persistant dizziness or light-headedness      Call MD for:  extreme fatigue      Call MD for:  persistant nausea and vomiting      Increase activity slowly      Call MD for:  temperature >100.4      Call MD for:  persistant nausea and vomiting      Call MD for:  difficulty breathing, headache or visual disturbances       Call MD for:  persistant dizziness or light-headedness      Call MD for:  extreme fatigue          DISCHARGE MEDICATIONS: Discharge Medication List as of 11/14/2011  3:06 PM    CONTINUE these medications which have CHANGED   Details  acetaminophen (TYLENOL) 650 MG suppository Place 1 suppository (650 mg total) rectally every 6 (six) hours as needed (or Fever >/= 101)., Starting 11/14/2011, Until Thu 11/24/11, Print    alendronate (FOSAMAX) 70 MG tablet Take 1 tablet (70 mg total) by mouth every 7 (seven) days. Take with a full glass of water on an empty stomach., Starting 11/14/2011, Until Tue 11/13/12, Print    amLODipine (NORVASC) 10 MG tablet Take 1 tablet (10 mg total) by mouth daily., Starting 11/14/2011, Until Tue 11/13/12, Print      CONTINUE these medications which have NOT CHANGED   Details  OLANZapine (ZYPREXA) 20 MG tablet Take 20 mg by mouth at bedtime.  , Until Discontinued, Historical Med    traZODone (DESYREL) 100 MG tablet Take 100 mg by mouth at bedtime.  , Until Discontinued, Historical Med         CONSULTS:    1. Neurology was consulted regarding his  possible CVA. 2. Surgery was consulted regarding his parathyroid adenoma.  PROCEDURES PERFORMED:  Ct Head Wo Contrast  11/09/2011  *RADIOLOGY REPORT*  Clinical Data: Unsteady gait.  Left body weakness.  CT HEAD WITHOUT CONTRAST  Technique:  Contiguous axial images were obtained from the base of the skull through the vertex without contrast.  Comparison: 08/22/2010.  Findings: No intracranial hemorrhage.  Prominent white matter type changes.  Posterior superior aspect of the posterior limb of the right internal capsule small acute infarct not excluded.  Mild atrophy without hydrocephalus.  No intracranial mass lesion detected on this unenhanced exam.  Prior orbital surgery with wires and plate/screw is in place.  IMPRESSION: No intracranial hemorrhage.  Prominent white matter type changes.  Posterior superior  aspect of the posterior limb of the right internal capsule small acute infarct not excluded.  Mild atrophy without hydrocephalus.  Prior orbital surgery with wires and plate/screw is in place.  Critical Value/emergent results were called by telephone at the time of interpretation on 11/09/2011   at 6:56 a.m.  to  Dr. Sabra Heck, who verbally acknowledged these results.  Original Report Authenticated By: Doug Sou, M.D.   Ct Chest Wo Contrast  11/11/2011  *RADIOLOGY REPORT*  Clinical Data: Parathyroid adenoma.  CT CHEST WITHOUT CONTRAST  Technique:  Multidetector CT imaging of the chest was performed following the standard protocol without IV contrast.  Comparison: 09/08/2010.  Findings: A sub centimeter low attenuation lesion in the right lobe of the thyroid is again noted.  A soft tissue nodule in the high right paratracheal station measures approximately 1.3 x 2.7 cm (previously 0.8 x 2.5 cm).  Hilar regions are difficult to definitively evaluate without IV contrast.  No axillary adenopathy. Heart size normal.  No pericardial effusion.  Tiny left pleural effusion.  Atelectasis in both lower lobes. Airway is unremarkable.  Incidental imaging of the upper abdomen shows a stone and high density material in the gallbladder.  No worrisome lytic or sclerotic lesions.  IMPRESSION:  1.  High right paratracheal soft tissue nodule presumably corresponds to the patient's known parathyroid adenoma, and is increased in size from 09/08/2010. 2.  Tiny left pleural effusion. 3.  Cholelithiasis.  Original Report Authenticated By: Luretha Rued, M.D.   US Renal  11/12/2011  *RADIOLOGY REPORT*  Clinical Data:  Acute on chronic renal insufficiency  RENAL/URINARY TRACT ULTRASOUND COMPLETE  Comparison:  08/15/2010 .  Findings:  Right Kidney:  Measures 9.4 cm. Diffuse increased cortical echogenicity.  No mass or hydronephrosis.  To stones are seen within the inferior pole measuring up to 9 mm.  There is a cyst also within  the upper pole measuring 1 cm.  Left Kidney:  Measures 9.3 cm. Increase in cortical echogenicity. No mass or hydronephrosis.  Nonobstructing 7 mm calculus is noted within the inferior pole.  Bladder:  Collapsed around a Foley catheter balloon.  IMPRESSION:  1.  No hydronephrosis. 2.  Bilateral echogenic kidneys. 3.  Bilateral nonobstructing renal calculi.  Original Report Authenticated By: Angelita Ingles, M.D.   Nm Parathyroid Scan W/spect  11/11/2011  *RADIOLOGY REPORT*  Clinical Data:  Persistent hyperparathyroidism. History of parathyroid surgery last year.  NM PARATHYROID SCINTIGRAPHY AND SPECT IMAGING  Technique:  Following intravenous administration of radiopharmaceutical, early and 2-hour delayed planar images were obtained in the anterior projection.  Delayed triplanar SPECT images were also obtained at 2 hours.  Radiopharmaceutical:  25 mCi Tc-41m Sestamibi IV  Comparison:  Nuclear medicine parathyroid scan 09/08/2010 and 08/25/2010.  Chest CT 09/08/2010.  Findings: Early planar images again demonstrate a focus of increased uptake inferior to the right thyroid lobe.  On the delayed planar images, most of the thyroid activity washes out. The focal activity inferior to the right thyroid lobe persists. The location of this activity is unchanged from the prior studies.  The intensity of the activity has slightly increased.  Today, delayed SPECT imaging was performed. The activity localizes to the right lateral paratracheal region, inferior to the right thyroid lobe.  This corresponds with the 2.5 x 0.8 cm soft tissue nodule demonstrated in this region on the comparison chest CT.  IMPRESSION: Persistent inferior right parathyroid adenoma located in the right paratracheal region.  Original Report Authenticated By: Vivia Ewing, M.D.   Dg Chest Port 1 View  11/09/2011  *RADIOLOGY REPORT*  Clinical Data: Shortness of breath.  PORTABLE CHEST - 1 VIEW  Comparison: 08/22/2010 chest x-ray.  09/07/2010  chest CT.  Findings: Tortuous thoracic aorta.  Heart size top normal.  Mild central pulmonary vascular prominence.  Minimal basilar atelectasis/scarring.  No pulmonary edema or segmental infiltrate.  No gross pneumothorax.  IMPRESSION: Mildly tortuous aorta.  Heart size top normal with mild central pulmonary vascular prominence.  Minimal basilar atelectasis/scarring.  Original Report Authenticated By: Doug Sou, M.D.     ADMISSION DATA:  H&P: Patient is a 65 y.o. male with a PMHx of primary hyperparathyroidism, hypercalcemia, schizophrenia and alcohol abuse, who presents to [facility] for evaluation of somnolence and altered mental status. It appears that patient was brought from home. There is no family present at bedside or when patient presented. History is limited due to altered mental status. Patient does open his eyes but does not currently follow commands or answer questions appropriately. Patient is mumbling and his speech is incoherent. Initially patient presented as a code stroke based on altered mental status and a CT scan that could not exclude a small infarct. Neurology evaluated patient and his patient was not code stroke, but several other studies were ordered including A1C, MRI/MRA, Echo, and dopplers. However, were not done as his CT findings did not correlate to clinical picture, and his global altered sensorium was considered to be secondary to metabolic derangement.   Physical Exam: General:  Alert, opens eyes, NAD   Head:  Normocephalic, atraumatic.   Neck:  No deformities, masses, or tenderness noted.Supple, No carotid Bruits, no JVD.   Lungs:   Normal respiratory effort. Clear to auscultation BL without crackles or wheezes.   Heart:  RRR. S1 and S2 normal without gallop, murmur, or rubs.   Abdomen:   BS normoactive. Soft, Nondistended, non-tender.  No masses or organomegaly.   Extremities:  No pretibial edema. DP pulse 2+   Neurologic:  Alert but not oriented, speech incoherent,  did not follow commands, no obvious focal deficits noted, no facial droop   Skin:  No visible rashes, scars.     Labs: Lab results: Basic Metabolic Panel: Recent Labs   [facility]  11/09/11 0710     NA  140     K  4.0     CL  109     CO2  22     GLUCOSE  129*     BUN  37*     CREATININE  3.67*     CALCIUM  >15.0*     MG  --     PHOS  --    Liver Function Tests: Recent Labs   [facility]  11/09/11  0710     AST  17     ALT  7     ALKPHOS  121*     BILITOT  0.2*     PROT  7.7     ALBUMIN  3.4*    No results found for this basename: LIPASE:2,AMYLASE:2 in the last 72 hours CBC: Recent Labs   Basename  11/09/11 0710     WBC  5.6     NEUTROABS  4.1     HGB  11.2*     HCT  34.8*     MCV  88.5     PLT  342    Cardiac Enzymes: Recent Labs   Basename  11/09/11 0710     CKTOTAL  119     CKMB  2.8     CKMBINDEX  --     TROPONINI  <0.30    CBG: Recent Labs   Basename  11/09/11 0700     GLUCAP  110*    Urine Drug Screen: Drugs of Abuse       Component  Value  Date/Time     LABOPIA  NONE DETECTED  11/09/2011 0733     COCAINSCRNUR  NONE DETECTED  11/09/2011 0733     LABBENZ  NONE DETECTED  11/09/2011 0733     AMPHETMU  NONE DETECTED  11/09/2011 0733     THCU  NONE DETECTED  11/09/2011 0733     LABBARB  NONE DETECTED  11/09/2011 0733      Alcohol Level: Recent Labs   Basename  11/09/11 0710     ETH  <11    Urinalysis: Results for ULRIK, GUIRGUIS (MRN [id number]) as of 11/09/2011 09:52   Ref. Range  11/09/2011 07:33   Color, Urine  Latest Range: YELLOW   YELLOW   APPearance  Latest Range: CLEAR   CLOUDY (A)   Specific Gravity, Urine  Latest Range: 1.005-1.030   1.010   pH  Latest Range: 5.0-8.0   6.5   Glucose, UA  Latest Range: NEGATIVE mg/dL  NEGATIVE   Bilirubin Urine  Latest Range: NEGATIVE   NEGATIVE   Ketones, ur  Latest Range: NEGATIVE mg/dL  NEGATIVE   Protein  Latest Range: NEGATIVE mg/dL  NEGATIVE   Urobilinogen, UA  Latest Range: 0.0-1.0 mg/dL   0.2   Nitrite  Latest Range: NEGATIVE   NEGATIVE   Leukocytes, UA  Latest Range: NEGATIVE   LARGE (A)   WBC, UA  Latest Range: <3 WBC/hpf  21-50   RBC / HPF  Latest Range: <3 RBC/hpf  11-20   Squamous Epithelial / LPF  Latest Range: RARE   RARE   Bacteria, UA  Latest Range: RARE   MANY (A)     HOSPITAL COURSE:  1. Altered mental status - Likely multifactorial. On admission, largely contributed by his severe hypercalcemia with admission Ca of >15 and acute infection (UTI). MRI did confirm stroke, although not localized to a region that would be expected to have this profound effect on his mentation. Likely also has a baseline level of cognitive impairment in the setting of his alcohol abuse.  Other differential diagnosis include, but less likely, drugs (UDS negative), alcohol intoxication (alcohol level <11), hypo-or hyperthyroidism (TSH normal), hepatic encephalopathy (no typical astaxis tremor, ammonia level normal).  Patient has been treated with IV fluid, antibiotics for UTI and regimen for lowering blood calcium level as summarized below. His mental status recovered gradually. At discharge, his is oriented X 3 and  is back to his baseline.  2. Hypercalcemia in setting of primary hyperparathyroidism - intact PTH level of 954.1 and blood Ca > 15. Since admission, patient has been treated with aggressive IV fluid, total 1000 units of IV calcitonin and one dose of 90 mg IV pamidronate (aredia) on 11/09/11. Patient's calcium level is slowly decreased. At discharge, his blood calcium level is stabilized at 11.2. I spoke with Dr. Buddy Duty  (endocrinology) before discharging patient. Dr. Buddy Duty recommended starting Alendronate (70 mg/week) on discharge. Patient will be followed up with Dr. Buddy Duty at Dec. 18, 2012. Patient had Nm Parathyroid Scan W/spect on 11/11/11, which showed persistent inferior right parathyroid adenoma located in the right paratracheal region. Surgery has been consulted - who recommend  outpatient surgery - Patient will be followed up with Dr. Maia Petties., MD on 12/08/2011.  3. CVA - CT head on 12/12 showing posterior superior aspect of the posterior limb of the right internal capsule small acute infarct not excluded. Neurology service initially involved, but did not feel that this clinically correlates with affected area described. Therefore, no further stroke w/u recommended.   4. 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 Cr on admission 3.67, was slowly downtrending with a trough of 2.9. At discharge, his cre is 2.75. US-renal shows: No hydronephrosis. 2.  Bilateral echogenic kidneys. 3.  Bilateral nonobstructing renal calculi.   5. Alcoholism - although alcohol level <11, unknown when patient's last had alcohol.  Patient has been treated with IV fluid, and placed on CIWA protocol in hospital. There was no event in the hospital.  6. Schizophrenia - was discharged on zyprexa during last hospitalization. Unknown and unlikely that patient is taking. There was a trazodone bottle present at bedside. We continued his  Zyprexa.                                                      7. Hypertension - well controlled. Continued his home medication.   8. UTI - UCx growing Enterobacter, sensitive to Rocephin. Has completed 3 day course of Abx. DC-ed Rocephin. US-renal shows: No hydronephrosis. 2.  Bilateral echogenic kidneys. 3. Bilateral nonobstructing renal calculi. At discharge, patient is asymptomatic.  DISCHARGE DATA: Vital Signs: BP 158/91  Pulse 64  Temp(Src) 97.9 F (36.6 C) (Oral)  Resp 18  Ht 5\' 9"  (1.753 m)  Wt 159 lb 2.8 oz (72.2 kg)  BMI 23.51 kg/m2  SpO2 100%  Labs: Results for orders placed during the hospital encounter of 11/09/11 (from the past 24 hour(s))  BASIC METABOLIC PANEL     Status: Abnormal    Collection Time   11/14/11 12:42 AM      Component Value Range   Sodium 137  135 - 145 (mEq/L)   Potassium 3.4 (*) 3.5 - 5.1 (mEq/L)   Chloride 110  96 - 112 (mEq/L)   CO2 18 (*) 19 - 32 (mEq/L)   Glucose, Bld 123 (*) 70 - 99 (mg/dL)   BUN 29 (*) 6 - 23 (mg/dL)   Creatinine, Ser 2.75 (*) 0.50 - 1.35 (mg/dL)   Calcium 11.2 (*) 8.4 - 10.5 (mg/dL)   GFR calc non Af Amer 23 (*) >90 (mL/min)   GFR  calc Af Amer 26 (*) >90 (mL/min)    Signed: Ivor Costa, MD, PhD  PGY I, Internal Medicine Resident 11/14/2011, 7:11 PM

## 2011-11-14 NOTE — Progress Notes (Signed)
Foley catheter removed without difficulty. 900 ml clear yellow urine noted to be in drainage bag. Pt tolerated procedure well.

## 2011-11-14 NOTE — Progress Notes (Signed)
Pt discharged to home via wheelchair with brother. Dishcharge instructions given to brother due to patient's confusion. Pt.'s brother verbalized understanding of instructions. No further questions at this time.

## 2011-12-06 ENCOUNTER — Telehealth (INDEPENDENT_AMBULATORY_CARE_PROVIDER_SITE_OTHER): Payer: Self-pay | Admitting: General Surgery

## 2011-12-06 NOTE — Telephone Encounter (Signed)
Pt needs to be reschedule with a Dr who those Thyroid and need a good phone number. The ones in [employer] and [employer] does not work

## 2011-12-08 ENCOUNTER — Ambulatory Visit (INDEPENDENT_AMBULATORY_CARE_PROVIDER_SITE_OTHER): Payer: Self-pay | Admitting: Surgery

## 2011-12-19 ENCOUNTER — Encounter (INDEPENDENT_AMBULATORY_CARE_PROVIDER_SITE_OTHER): Payer: Self-pay | Admitting: Surgery

## 2012-01-04 ENCOUNTER — Other Ambulatory Visit: Payer: Self-pay

## 2012-01-04 ENCOUNTER — Emergency Department (HOSPITAL_COMMUNITY): Payer: Medicaid Other

## 2012-01-04 ENCOUNTER — Encounter (HOSPITAL_COMMUNITY): Payer: Self-pay | Admitting: *Deleted

## 2012-01-04 ENCOUNTER — Inpatient Hospital Stay (HOSPITAL_COMMUNITY)
Admission: EM | Admit: 2012-01-04 | Discharge: 2012-01-07 | DRG: 643 | Disposition: A | Discharge status: Home / Self Care | Payer: Medicaid Other | Attending: Internal Medicine | Admitting: Internal Medicine

## 2012-01-04 DIAGNOSIS — N289 Disorder of kidney and ureter, unspecified: Secondary | ICD-10-CM

## 2012-01-04 DIAGNOSIS — D351 Benign neoplasm of parathyroid gland: Secondary | ICD-10-CM

## 2012-01-04 DIAGNOSIS — K859 Acute pancreatitis without necrosis or infection, unspecified: Secondary | ICD-10-CM

## 2012-01-04 DIAGNOSIS — R4182 Altered mental status, unspecified: Secondary | ICD-10-CM

## 2012-01-04 DIAGNOSIS — N184 Chronic kidney disease, stage 4 (severe): Secondary | ICD-10-CM | POA: Diagnosis present

## 2012-01-04 DIAGNOSIS — F17200 Nicotine dependence, unspecified, uncomplicated: Secondary | ICD-10-CM | POA: Diagnosis present

## 2012-01-04 DIAGNOSIS — E872 Acidosis, unspecified: Secondary | ICD-10-CM | POA: Diagnosis present

## 2012-01-04 DIAGNOSIS — Z87442 Personal history of urinary calculi: Secondary | ICD-10-CM

## 2012-01-04 DIAGNOSIS — Z720 Tobacco use: Secondary | ICD-10-CM | POA: Diagnosis present

## 2012-01-04 DIAGNOSIS — N390 Urinary tract infection, site not specified: Secondary | ICD-10-CM | POA: Diagnosis present

## 2012-01-04 DIAGNOSIS — E210 Primary hyperparathyroidism: Secondary | ICD-10-CM | POA: Diagnosis present

## 2012-01-04 DIAGNOSIS — F1010 Alcohol abuse, uncomplicated: Secondary | ICD-10-CM

## 2012-01-04 DIAGNOSIS — N189 Chronic kidney disease, unspecified: Secondary | ICD-10-CM | POA: Diagnosis present

## 2012-01-04 DIAGNOSIS — N251 Nephrogenic diabetes insipidus: Secondary | ICD-10-CM

## 2012-01-04 DIAGNOSIS — I129 Hypertensive chronic kidney disease with stage 1 through stage 4 chronic kidney disease, or unspecified chronic kidney disease: Secondary | ICD-10-CM | POA: Diagnosis present

## 2012-01-04 DIAGNOSIS — F209 Schizophrenia, unspecified: Secondary | ICD-10-CM | POA: Diagnosis present

## 2012-01-04 DIAGNOSIS — N179 Acute kidney failure, unspecified: Secondary | ICD-10-CM | POA: Diagnosis present

## 2012-01-04 DIAGNOSIS — E215 Disorder of parathyroid gland, unspecified: Secondary | ICD-10-CM | POA: Diagnosis present

## 2012-01-04 DIAGNOSIS — I10 Essential (primary) hypertension: Secondary | ICD-10-CM | POA: Diagnosis present

## 2012-01-04 DIAGNOSIS — E213 Hyperparathyroidism, unspecified: Principal | ICD-10-CM | POA: Diagnosis present

## 2012-01-04 DIAGNOSIS — G9341 Metabolic encephalopathy: Secondary | ICD-10-CM | POA: Diagnosis present

## 2012-01-04 HISTORY — DX: Encephalopathy, unspecified: G93.40

## 2012-01-04 LAB — URINALYSIS, ROUTINE W REFLEX MICROSCOPIC
Bilirubin Urine: NEGATIVE
Nitrite: NEGATIVE
Specific Gravity, Urine: 1.016 (ref 1.005–1.030)
pH: 6 (ref 5.0–8.0)

## 2012-01-04 LAB — POCT I-STAT TROPONIN I: Troponin i, poc: 0 ng/mL (ref 0.00–0.08)

## 2012-01-04 LAB — CBC
MCH: 28.7 pg (ref 26.0–34.0)
MCV: 85.8 fL (ref 78.0–100.0)
Platelets: 300 10*3/uL (ref 150–400)
RDW: 14.6 % (ref 11.5–15.5)

## 2012-01-04 LAB — COMPREHENSIVE METABOLIC PANEL
ALT: 16 U/L (ref 0–53)
Calcium: >15 mg/dL (ref 8.4–10.5)
GFR calc Af Amer: 21 mL/min — ABNORMAL LOW (ref >90)
Glucose, Bld: 108 mg/dL — ABNORMAL HIGH (ref 70–99)
Sodium: 140 mEq/L (ref 135–145)
Total Protein: 8.5 g/dL — ABNORMAL HIGH (ref 6.0–8.3)

## 2012-01-04 LAB — DIFFERENTIAL
Basophils Absolute: 0 10*3/uL (ref 0.0–0.1)
Eosinophils Absolute: 0 10*3/uL (ref 0.0–0.7)
Eosinophils Relative: 0 % (ref 0–5)
Lymphs Abs: 1.2 10*3/uL (ref 0.7–4.0)

## 2012-01-04 LAB — LACTIC ACID, PLASMA: Lactic Acid, Venous: 1.1 mmol/L (ref 0.5–2.2)

## 2012-01-04 LAB — PROTIME-INR
INR: 1.15 (ref 0.00–1.49)
Prothrombin Time: 14.9 seconds (ref 11.6–15.2)

## 2012-01-04 LAB — URINE MICROSCOPIC-ADD ON

## 2012-01-04 MED ORDER — SODIUM CHLORIDE 0.9 % IV BOLUS (SEPSIS)
1000.0000 mL | Freq: Once | INTRAVENOUS | Status: AC
  Administered 2012-01-04: 1000 mL via INTRAVENOUS

## 2012-01-04 MED ORDER — SODIUM CHLORIDE 0.9 % IV SOLN
INTRAVENOUS | Status: DC
  Administered 2012-01-04: 23:00:00 via INTRAVENOUS

## 2012-01-04 MED ORDER — ONDANSETRON HCL 4 MG/2ML IJ SOLN
4.0000 mg | Freq: Once | INTRAMUSCULAR | Status: AC
  Administered 2012-01-04: 4 mg via INTRAVENOUS
  Filled 2012-01-04: qty 2

## 2012-01-04 MED ORDER — DEXTROSE 5 % IV SOLN
1.0000 g | Freq: Once | INTRAVENOUS | Status: AC
  Administered 2012-01-04: 1 g via INTRAVENOUS
  Filled 2012-01-04: qty 10

## 2012-01-04 NOTE — ED Notes (Signed)
Dr. Edison Pace notified of pts critical lab value.

## 2012-01-04 NOTE — ED Notes (Signed)
Returned from CT.

## 2012-01-04 NOTE — ED Notes (Signed)
Patient transported to CT 

## 2012-01-04 NOTE — ED Notes (Signed)
Pt brought in via ptar placed on monitor and made comfortable.

## 2012-01-04 NOTE — ED Notes (Signed)
[id number] Expected date:01/04/12<BR> Expected time: 7:38 PM<BR> Means of arrival:Ambulance<BR> Comments:<BR> EMS 32 Ptar - weakness

## 2012-01-04 NOTE — ED Provider Notes (Signed)
History     CSN: [id number]  Arrival date & time 01/04/12  1943   First MD Initiated Contact with Patient 01/04/12 2036      Chief Complaint  Patient presents with  . Weakness    pt's family state pt has been having increased weakness for the past few days. pt has not seen regular MD at this time.     (Consider location/radiation/quality/duration/timing/severity/associated sxs/prior treatment) HPI Comments: Patient lives with his brother. Said increased generalized weakness over the past several days. Patient not answering questions appropriate. Had similar symptoms when he had hypercalcemia  Patient is a 66 y.o. male presenting with weakness. The history is provided by the patient and a relative. The history is limited by the condition of the patient (level 5 caveat due to pt AMS). No language interpreter was used.  Weakness The symptoms began 3 to 5 days ago. The symptoms are worsening. The neurological symptoms are diffuse.  Additional symptoms include weakness. Additional symptoms do not include pain, lower back pain or loss of balance.    Past Medical History  Diagnosis Date  . Schizophrenia     previously on zyprexa  . Primary hyperparathyroidism     s/p parathyroid adenoma resection Oct. 8, 2011, repeat scan showed residual right adenoma, however pt did not f/u with surgery as outpt  . Hypercalcemia     2/2 primary hyperparathyroidism  . History of nephrolithiasis 2008    s/p ureterolithotomy   . Pancreatitis 2011    history of several admissions for pancreatitis   . Alcoholism   . Chronic renal insufficiency     with history of acute on chronic secondary to dehydration and hypercalcemia  . Altered mental status 2011    hx of 2/2 hypercalcemia, EtOH w/d and medication overuse  . Hypertension   . Tobacco abuse   . Fx humeral neck 2008    right side, no history of surgical repair    Past Surgical History  Procedure Date  . Parathyroid adenoma resection Oct. 8,  2011    residual adenoma Oct. 12, 2011    History reviewed. No pertinent family history.  History  Substance Use Topics  . Smoking status: Current Everyday Smoker    Types: Cigarettes  . Smokeless tobacco: Not on file  . Alcohol Use: Yes     history of heavy alcohol use      Review of Systems  Unable to perform ROS: Mental status change  Neurological: Positive for weakness. Negative for loss of balance.    Allergies  Review of patient's allergies indicates no known allergies.  Home Medications   Current Outpatient Rx  Name Route Sig Dispense Refill  . AMLODIPINE BESYLATE 10 MG PO TABS Oral Take 1 tablet (10 mg total) by mouth daily. 30 tablet 3  . OLANZAPINE 20 MG PO TABS Oral Take 20 mg by mouth at bedtime.      . TRAZODONE HCL 100 MG PO TABS Oral Take 100 mg by mouth at bedtime.      . ALENDRONATE SODIUM 70 MG PO TABS Oral Take 1 tablet (70 mg total) by mouth every 7 (seven) days. Take with a full glass of water on an empty stomach. 30 tablet 2    Please start taking this medication at Dec. 19, 20 ...    BP 173/103  Pulse 90  Temp(Src) 99.1 F (37.3 C) (Oral)  Resp 28  Ht 5\' 6"  (1.676 m)  Wt 180 lb (81.647 kg)  BMI 29.05  kg/m2  SpO2 100%  Physical Exam  Nursing note and vitals reviewed. Constitutional: He appears well-developed and well-nourished. No distress.       Confused and not answering questions appropriately  HENT:  Head: Normocephalic and atraumatic.  Mouth/Throat: Oropharynx is clear and moist. No oropharyngeal exudate.       +chovsteks sign  Eyes: Conjunctivae and EOM are normal. Pupils are equal, round, and reactive to light.  Neck: Normal range of motion. Neck supple.  Cardiovascular: Normal rate, regular rhythm, normal heart sounds and intact distal pulses.  Exam reveals no gallop and no friction rub.   No murmur heard. Pulmonary/Chest: Effort normal and breath sounds normal. No respiratory distress.  Abdominal: Soft. Bowel sounds are normal.  There is no tenderness.  Musculoskeletal: Normal range of motion. He exhibits no tenderness.  Neurological: No cranial nerve deficit.  Skin: Skin is warm and dry. No rash noted.    ED Course  Procedures (including critical care time)   Date: 01/04/2012  Rate: 93  Rhythm: normal sinus rhythm  QRS Axis: left  Intervals: normal  ST/T Wave abnormalities: normal  Conduction Disutrbances:none  Narrative Interpretation:   Old EKG Reviewed: unchanged  Labs Reviewed  CBC - Abnormal; Notable for the following:    WBC 13.0 (*)    Hemoglobin 12.9 (*)    HCT 38.6 (*)    All other components within normal limits  DIFFERENTIAL - Abnormal; Notable for the following:    Neutrophils Relative 81 (*)    Neutro Abs 10.5 (*)    Lymphocytes Relative 10 (*)    Monocytes Absolute 1.2 (*)    All other components within normal limits  COMPREHENSIVE METABOLIC PANEL - Abnormal; Notable for the following:    Glucose, Bld 108 (*)    BUN 45 (*)    Creatinine, Ser 3.34 (*)    Calcium >15.0 (*)    Total Protein 8.5 (*)    AST 44 (*)    Alkaline Phosphatase 182 (*)    GFR calc non Af Amer 18 (*)    GFR calc Af Amer 21 (*)    All other components within normal limits  URINALYSIS, ROUTINE W REFLEX MICROSCOPIC - Abnormal; Notable for the following:    APPearance CLOUDY (*)    Hgb urine dipstick LARGE (*)    Protein, ur 100 (*)    Leukocytes, UA LARGE (*)    All other components within normal limits  GLUCOSE, CAPILLARY - Abnormal; Notable for the following:    Glucose-Capillary 109 (*)    All other components within normal limits  URINE MICROSCOPIC-ADD ON - Abnormal; Notable for the following:    Bacteria, UA MANY (*)    All other components within normal limits  PROTIME-INR  LACTIC ACID, PLASMA  POCT I-STAT TROPONIN I   Ct Head Wo Contrast  01/04/2012  *RADIOLOGY REPORT*  Clinical Data: Altered mental status.  CT HEAD WITHOUT CONTRAST  Technique:  Contiguous axial images were obtained from the  base of the skull through the vertex without contrast.  Comparison: 10/30/2011  Findings: There are subcortical and periventricular white matter hypodensities, a nonspecific finding most often seen with chronic microangiopathic changes.  There is no evidence for acute hemorrhage, overt hydrocephalus, mass lesion, or abnormal extra-axial fluid collection. There are questionable age indeterminate lacunar infarctions, for instance within the left anterior limb internal capsule and right thalamus. No definite CT evidence for acute cortical based (large artery) infarction.  Operative changes of the left maxillary sinus  anterior wall. The visualized paranasal sinuses and mastoid air cells are predominately clear.  IMPRESSION: Moderate to advanced white matter changes, a nonspecific finding often seen with chronic microangiopathic change.  Questionable age indeterminate lacunar infarctions within the left anterior limb internal capsule and right thalamus.  No hemorrhage, mass effect, or acute cortical based (large artery) infarction.  Original Report Authenticated By: Suanne Marker, M.D.   Dg Chest Port 1 View  01/04/2012  *RADIOLOGY REPORT*  Clinical Data: Mental status changes.  CHEST - 1 VIEW  Comparison:  11/09/2011  Findings: The heart size and mediastinal contours are within normal limits.  Both lungs are clear.  IMPRESSION: No active disease.  Original Report Authenticated By: Azzie Roup, M.D.     1. Hypercalcemia   2. UTI (lower urinary tract infection)   3. Altered mental status       MDM  Altered mental status secondary to hypercalcemia. Calcium level greater than 15. He is started on IV fluids aggressively. Render of his laboratory studies showed a urinary tract infection but otherwise unremarkable. He'll require admission for treatment of his hypercalcemia. Has slight increase in his creatinine but has chronic renal insufficiency. Discussed with the hospitalist who accepted patient for  admission to a telemetry service.        Trisha Mangle, MD 01/04/12 [phone]

## 2012-01-05 ENCOUNTER — Encounter (HOSPITAL_COMMUNITY): Payer: Self-pay

## 2012-01-05 DIAGNOSIS — E215 Disorder of parathyroid gland, unspecified: Secondary | ICD-10-CM | POA: Diagnosis present

## 2012-01-05 DIAGNOSIS — N184 Chronic kidney disease, stage 4 (severe): Secondary | ICD-10-CM | POA: Diagnosis present

## 2012-01-05 DIAGNOSIS — D351 Benign neoplasm of parathyroid gland: Secondary | ICD-10-CM

## 2012-01-05 DIAGNOSIS — G9341 Metabolic encephalopathy: Secondary | ICD-10-CM | POA: Diagnosis present

## 2012-01-05 DIAGNOSIS — E872 Acidosis: Secondary | ICD-10-CM | POA: Diagnosis present

## 2012-01-05 DIAGNOSIS — Z720 Tobacco use: Secondary | ICD-10-CM | POA: Diagnosis present

## 2012-01-05 LAB — COMPREHENSIVE METABOLIC PANEL
ALT: 14 U/L (ref 0–53)
ALT: 16 U/L (ref 0–53)
AST: 40 U/L — ABNORMAL HIGH (ref 0–37)
AST: 38 U/L — ABNORMAL HIGH (ref 0–37)
AST: 40 U/L — ABNORMAL HIGH (ref 0–37)
Albumin: 3.3 g/dL — ABNORMAL LOW (ref 3.5–5.2)
Albumin: 3.1 g/dL — ABNORMAL LOW (ref 3.5–5.2)
Alkaline Phosphatase: 161 U/L — ABNORMAL HIGH (ref 39–117)
Alkaline Phosphatase: 150 U/L — ABNORMAL HIGH (ref 39–117)
BUN: 44 mg/dL — ABNORMAL HIGH (ref 6–23)
BUN: 40 mg/dL — ABNORMAL HIGH (ref 6–23)
CO2: 18 mEq/L — ABNORMAL LOW (ref 19–32)
CO2: 19 mEq/L (ref 19–32)
Calcium: 12.9 mg/dL — ABNORMAL HIGH (ref 8.4–10.5)
Calcium: 12 mg/dL — ABNORMAL HIGH (ref 8.4–10.5)
Chloride: 108 mEq/L (ref 96–112)
Chloride: 112 mEq/L (ref 96–112)
Chloride: 110 mEq/L (ref 96–112)
Creatinine, Ser: 3.17 mg/dL — ABNORMAL HIGH (ref 0.50–1.35)
Creatinine, Ser: 2.99 mg/dL — ABNORMAL HIGH (ref 0.50–1.35)
GFR calc Af Amer: 22 mL/min — ABNORMAL LOW (ref >90)
GFR calc Af Amer: 23 mL/min — ABNORMAL LOW (ref >90)
GFR calc Af Amer: 24 mL/min — ABNORMAL LOW (ref >90)
GFR calc non Af Amer: 18 mL/min — ABNORMAL LOW (ref >90)
GFR calc non Af Amer: 21 mL/min — ABNORMAL LOW (ref >90)
Glucose, Bld: 110 mg/dL — ABNORMAL HIGH (ref 70–99)
Glucose, Bld: 98 mg/dL (ref 70–99)
Glucose, Bld: 72 mg/dL (ref 70–99)
Potassium: 3.5 mEq/L (ref 3.5–5.1)
Sodium: 137 mEq/L (ref 135–145)
Sodium: 135 mEq/L (ref 135–145)
Total Bilirubin: 0.3 mg/dL (ref 0.3–1.2)
Total Bilirubin: 0.3 mg/dL (ref 0.3–1.2)
Total Bilirubin: 0.3 mg/dL (ref 0.3–1.2)
Total Protein: 7.6 g/dL (ref 6.0–8.3)
Total Protein: 7.2 g/dL (ref 6.0–8.3)

## 2012-01-05 LAB — PHOSPHORUS: Phosphorus: 3.9 mg/dL (ref 2.3–4.6)

## 2012-01-05 LAB — CBC
HCT: 35.5 % — ABNORMAL LOW (ref 39.0–52.0)
Hemoglobin: 11.7 g/dL — ABNORMAL LOW (ref 13.0–17.0)
WBC: 12.3 10*3/uL — ABNORMAL HIGH (ref 4.0–10.5)

## 2012-01-05 LAB — MAGNESIUM: Magnesium: 2.5 mg/dL (ref 1.5–2.5)

## 2012-01-05 MED ORDER — LABETALOL HCL 5 MG/ML IV SOLN
10.0000 mg | INTRAVENOUS | Status: DC | PRN
  Administered 2012-01-05 – 2012-01-06 (×2): 10 mg via INTRAVENOUS
  Filled 2012-01-05 (×2): qty 4

## 2012-01-05 MED ORDER — TRAZODONE HCL 100 MG PO TABS
100.0000 mg | ORAL_TABLET | Freq: Every day | ORAL | Status: DC
  Administered 2012-01-05 – 2012-01-06 (×2): 100 mg via ORAL
  Filled 2012-01-05 (×4): qty 1

## 2012-01-05 MED ORDER — ACETAMINOPHEN 650 MG RE SUPP: 650.0000 mg | Freq: Four times a day (QID) | RECTAL | Status: DC | PRN

## 2012-01-05 MED ORDER — CALCITONIN (SALMON) 200 UNIT/ML IJ SOLN
4.0000 [IU]/kg | Freq: Once | INTRAMUSCULAR | Status: AC
  Administered 2012-01-05: 326 [IU] via INTRAMUSCULAR
  Filled 2012-01-05: qty 1.63

## 2012-01-05 MED ORDER — SODIUM CHLORIDE 0.9 % IV SOLN
INTRAVENOUS | Status: AC
  Administered 2012-01-05: 21:00:00 via INTRAVENOUS

## 2012-01-05 MED ORDER — ONDANSETRON HCL 4 MG PO TABS: 4.0000 mg | ORAL_TABLET | Freq: Four times a day (QID) | ORAL | Status: DC | PRN

## 2012-01-05 MED ORDER — DEXTROSE 5 % IV SOLN
1.0000 g | Freq: Every day | INTRAVENOUS | Status: DC
  Administered 2012-01-05 – 2012-01-06 (×2): 1 g via INTRAVENOUS
  Filled 2012-01-05 (×4): qty 10

## 2012-01-05 MED ORDER — ACETAMINOPHEN 325 MG PO TABS: 650.0000 mg | ORAL_TABLET | Freq: Four times a day (QID) | ORAL | Status: DC | PRN

## 2012-01-05 MED ORDER — ONDANSETRON HCL 4 MG/2ML IJ SOLN: 4.0000 mg | Freq: Four times a day (QID) | INTRAMUSCULAR | Status: DC | PRN

## 2012-01-05 MED ORDER — DOCUSATE SODIUM 100 MG PO CAPS
100.0000 mg | ORAL_CAPSULE | Freq: Two times a day (BID) | ORAL | Status: DC
  Administered 2012-01-05 – 2012-01-07 (×5): 100 mg via ORAL
  Filled 2012-01-05 (×8): qty 1

## 2012-01-05 MED ORDER — SODIUM CHLORIDE 0.9 % IV SOLN
INTRAVENOUS | Status: DC
  Administered 2012-01-05: 10:00:00 via INTRAVENOUS
  Administered 2012-01-05: 150 mL/h via INTRAVENOUS
  Administered 2012-01-05 – 2012-01-06 (×4): via INTRAVENOUS

## 2012-01-05 MED ORDER — SODIUM CHLORIDE 0.9 % IV SOLN
90.0000 mg | Freq: Once | INTRAVENOUS | Status: AC
  Administered 2012-01-05: 90 mg via INTRAVENOUS
  Filled 2012-01-05: qty 500

## 2012-01-05 MED ORDER — CLONIDINE HCL 0.1 MG PO TABS
0.1000 mg | ORAL_TABLET | Freq: Two times a day (BID) | ORAL | Status: DC
  Administered 2012-01-05 – 2012-01-06 (×3): 0.1 mg via ORAL
  Filled 2012-01-05 (×4): qty 1

## 2012-01-05 MED ORDER — OLANZAPINE 10 MG PO TABS
20.0000 mg | ORAL_TABLET | Freq: Every day | ORAL | Status: DC
  Administered 2012-01-05 – 2012-01-06 (×2): 20 mg via ORAL
  Filled 2012-01-05 (×4): qty 2

## 2012-01-05 MED ORDER — SODIUM CHLORIDE 0.9 % IJ SOLN
3.0000 mL | Freq: Two times a day (BID) | INTRAMUSCULAR | Status: DC
  Administered 2012-01-05 – 2012-01-07 (×5): 3 mL via INTRAVENOUS

## 2012-01-05 MED ORDER — AMLODIPINE BESYLATE 10 MG PO TABS
10.0000 mg | ORAL_TABLET | Freq: Every day | ORAL | Status: DC
  Administered 2012-01-05 – 2012-01-07 (×3): 10 mg via ORAL
  Filled 2012-01-05 (×4): qty 1

## 2012-01-05 MED ORDER — HEPARIN SODIUM (PORCINE) 5000 UNIT/ML IJ SOLN
5000.0000 [IU] | Freq: Three times a day (TID) | INTRAMUSCULAR | Status: DC
  Administered 2012-01-05 – 2012-01-07 (×8): 5000 [IU] via SUBCUTANEOUS
  Filled 2012-01-05 (×13): qty 1

## 2012-01-05 MED ORDER — SODIUM BICARBONATE 650 MG PO TABS
650.0000 mg | ORAL_TABLET | Freq: Three times a day (TID) | ORAL | Status: DC
  Administered 2012-01-05 – 2012-01-07 (×8): 650 mg via ORAL
  Filled 2012-01-05 (×12): qty 1

## 2012-01-05 MED ORDER — ONDANSETRON HCL 4 MG/2ML IJ SOLN: 4.0000 mg | Freq: Three times a day (TID) | INTRAMUSCULAR | Status: DC | PRN

## 2012-01-05 MED ORDER — SENNA 8.6 MG PO TABS
1.0000 | ORAL_TABLET | Freq: Two times a day (BID) | ORAL | Status: DC
  Administered 2012-01-05 – 2012-01-07 (×6): 8.6 mg via ORAL
  Filled 2012-01-05 (×6): qty 1

## 2012-01-05 NOTE — Progress Notes (Signed)
PATIENT DETAILS Name: [name] Age: 66 y.o. Sex: male Date of Birth: 10-10-1946 Admit Date: 01/04/2012 PCP:No primary provider on file. Emergency contact:   CONSULTS: 1.  Surgical consultation pending.  Interval History: [name] is a 66 year old male with h/o primary hyperparathyroidism s/p parathyroid adenoma resection 08/2010, admission 10/2011 showing persistent active parathyroid adenoma, hyperCa, schizophrenia, alcohol abuse who presented to the ER on 01/05/12 with severe hypercalcemia and encephalopathy, UTI, acute on chronic renal insufficiency.   ROS: [name] denies pain, SOB, cough.  He thinks he is a little stronger today.     Objective: Vital signs in last 24 hours: Temp:  [98.7 F (37.1 C)-99.7 F (37.6 C)] 99.7 F (37.6 C) (02/07 0522) Pulse Rate:  [78-98] 98  (02/07 0522) Resp:  [17-28] 18  (02/07 0522) BP: (158-187)/(79-112) 158/96 mmHg (02/07 0522) SpO2:  [96 %-100 %] 96 % (02/07 0522) Weight:  [81.647 kg (180 lb)] 81.647 kg (180 lb) (02/07 0047) Weight change:  Last BM Date:  (UTA - pt unable to indicate date of last BM)  Intake/Output from previous day:  Intake/Output Summary (Last 24 hours) at 01/05/12 0936 Last data filed at 01/05/12 [id number]  Gross per 24 hour  Intake 1097.5 ml  Output      0 ml  Net 1097.5 ml     Physical Exam:  Gen:  NAD Cardiovascular:  RRR, No M/R/G Respiratory: Lungs CTAB Gastrointestinal: Abdomen soft, NT/ND with normal active bowel sounds. Extremities: No C/E/C     Lab Results: Basic Metabolic Panel:  Lab [id number] 01/05/12 0145 01/04/12 2000  NA 140 137 140  K 4.1 4.3 --  CL 112 108 108  CO2 17* 18* 21  GLUCOSE 110* 91 108*  BUN 44* 44* 45*  CREATININE 3.17* 3.33* 3.34*  CALCIUM 13.9* >15.0* >15.0*  MG -- 2.5 --  PHOS -- 3.9 --   GFR Estimated Creatinine Clearance: 23.6 ml/min (by C-G formula based on Cr of 3.17). Liver Function Tests:  Lab 01/05/12 0742 01/05/12 0145 01/04/12 2000    AST 41* 40* 44*  ALT 15 14 16   ALKPHOS 161* 176* 182*  BILITOT 0.3 0.3 0.3  PROT 7.6 7.9 8.5*  ALBUMIN 3.1* 3.3* 3.5   Coagulation profile  Lab 01/04/12 2000  INR 1.15  PROTIME --    CBC:  Lab 01/05/12 0742 01/04/12 2000  WBC 12.3* 13.0*  NEUTROABS -- 10.5*  HGB 11.7* 12.9*  HCT 35.5* 38.6*  MCV 86.4 85.8  PLT 234 300   CBG:  Lab 01/04/12 2059  GLUCAP 109*    Studies/Results: Ct Head Wo Contrast  01/04/2012  *RADIOLOGY REPORT*  Clinical Data: Altered mental status.  CT HEAD WITHOUT CONTRAST  Technique:  Contiguous axial images were obtained from the base of the skull through the vertex without contrast.  Comparison: 10/30/2011  Findings: There are subcortical and periventricular white matter hypodensities, a nonspecific finding most often seen with chronic microangiopathic changes.  There is no evidence for acute hemorrhage, overt hydrocephalus, mass lesion, or abnormal extra-axial fluid collection. There are questionable age indeterminate lacunar infarctions, for instance within the left anterior limb internal capsule and right thalamus. No definite CT evidence for acute cortical based (large artery) infarction.  Operative changes of the left maxillary sinus anterior wall. The visualized paranasal sinuses and mastoid air cells are predominately clear.  IMPRESSION: Moderate to advanced white matter changes, a nonspecific finding often seen with chronic microangiopathic change.  Questionable age indeterminate lacunar infarctions within the  left anterior limb internal capsule and right thalamus.  No hemorrhage, mass effect, or acute cortical based (large artery) infarction.  Original Report Authenticated By: Suanne Marker, M.D.   Dg Chest Port 1 View  01/04/2012  *RADIOLOGY REPORT*  Clinical Data: Mental status changes.  CHEST - 1 VIEW  Comparison:  11/09/2011  Findings: The heart size and mediastinal contours are within normal limits.  Both lungs are clear.  IMPRESSION: No  active disease.  Original Report Authenticated By: Azzie Roup, M.D.    Medications: Scheduled Meds:   . amLODipine  10 mg Oral Daily  . calcitonin  4 Units/kg Intramuscular Once  . cefTRIAXone (ROCEPHIN)  IV  1 g Intravenous Once  . cefTRIAXone (ROCEPHIN)  IV  1 g Intravenous QHS  . docusate sodium  100 mg Oral BID  . heparin  5,000 Units Subcutaneous Q8H  . OLANZapine  20 mg Oral QHS  . ondansetron  4 mg Intravenous Once  . pamidronate  90 mg Intravenous Once  . senna  1 tablet Oral BID  . sodium chloride  1,000 mL Intravenous Once  . sodium chloride  3 mL Intravenous Q12H  . traZODone  100 mg Oral QHS   Continuous Infusions:   . sodium chloride 150 mL/hr (01/05/12 0107)  . DISCONTD: sodium chloride 125 mL/hr at 01/04/12 2235   PRN Meds:.acetaminophen, acetaminophen, labetalol, ondansetron (ZOFRAN) IV, ondansetron Antibiotics: Anti-infectives     Start     Dose/Rate Route Frequency Ordered Stop   01/05/12 2200   cefTRIAXone (ROCEPHIN) 1 g in dextrose 5 % 50 mL IVPB        1 g 100 mL/hr over 30 Minutes Intravenous Daily at bedtime 01/05/12 0054     01/04/12 2230   cefTRIAXone (ROCEPHIN) 1 g in dextrose 5 % 50 mL IVPB        1 g 100 mL/hr over 30 Minutes Intravenous  Once 01/04/12 2225 01/04/12 2305           Assessment/Plan:  Principal Problem:  *Parathyroid related hypercalcemia / Primary hyperparathyroidism The patient was admitted and put on aggressive IVFs.  He was given 4 units/kg of calcitonin and 90 mg of Pamidronate.  His calcium levels are beginning to come down.  Renal function still impaired so would avoid lasix for now.  His hypercalcemia is secondary to residual parathyroid adenoma. Had parathyroid scan on 11/11/11 which confirmed persistent right inferior parathyroid adenoma, and was recommend to have outpt surgery, but failed to f/u with Dr. Georgette Dover. Given the fact that he was lost to follow up, would ask for inpatient surgical consultation for  definitive management. Active Problems:  Hypertension Suboptimal control on Norvasc.  Add Clonidine.  UTI (lower urinary tract infection) On Rocephin day # 1; F/U Urine cultures.  Acute on chronic renal insufficiency / Stage 4 CKD / Metabolic acidosis. Baseline creatinine 2.75 (GFR 21-26) consistent with stage IV CKD.  Hydrating, but do not expect much improvement given baseline renal function.  Given metabolic acidosis, will start oral bicarbonate therapy.  Encephalopathy, metabolic Secondary to hypercalcemia.  Improving   Schizophrenia Continue Zyprexa and Trazodone.  Tobacco Abuse Tobacco cessation information per nursing will be provided.    LOS: 1 day   Jacquelynn Cree, MD Pager [phone]  01/05/2012, 9:36 AM

## 2012-01-05 NOTE — H&P (Signed)
PCP:   No primary provider on file.  Not confirmable, doesn't apparently have one  Chief Complaint:  Altered mental status, weakness  HPI: 59yoM with h/o primary hyperparathyroidism s/p parathyroid adenoma resection 08/2010, admission  10/2011 showing persistent active parathyroid adenoma, hyperCa, schizophrenia, alcohol abuse  presents with severe hypercalcemia and encephalopathy, UTI, acute on chronic renal insufficiency.   Pt was admitted 12/12-12/17 [facility] with somnolence and AMS, apparently living at  home but with no family present on evaluation. They initially thought it was code stroke due to  CT scan not being able to exclude small infarct. However, Neuro didn't feel this made clinical  sense and code stroke was called off. During that admission, they note a similar presentation  with somnolence in 08/2010, thought due to hyperCa. He was also seen to have UTI then. Through  evaluation PTH was 954, he was given IVF's, 1000u IV calcitonin, 90mg  IV Pamidronate, with  improvement to 11 by discharge. Recommended f/u with Dr. Buddy Duty in Endo and starting IV  alendronate. Also had parathyroid scan on 12/14 which confirmed persistent right inferior  parathyroid adenoma, and recommend outpt surgery, was to f/u with Dr. Georgette Dover.   Pt cannot given any history due to mental status, he is alert and looking around, makes simple  conversation but not attentive and can't answer questions. Per ED discussion who contacted his  brother, with whom apparently the patient lives with, the brother said he's been getting weaker,  and provided no further useful information.   In the ED, vitals were stable other than HTN. Labs with renal fxn 45/3.34 up from 29/2.75 in  10/2011. Calcium was >15. AlkP 182, AST was 44. Lactate was negative 1.1. WBC elevated at 13 with  81% neutros. UA with large LE, neg nitrite, too numerous to count WBC's and many bacteria.  Trop negative. CT head with  mod/advanced white matter changes, and questionable age indeterminate  lacunar infarcts in left anterior limb internal capsule and right thalamus. CXR negative.   Pt currently can minimally deny any complaints, and looks comfortable, but otherwise ROS not  obtainable.   Past Medical History  Diagnosis Date  . Schizophrenia     previously on zyprexa  . Primary hyperparathyroidism     s/p parathyroid adenoma resection Oct. 8, 2011, repeat scan showed residual right adenoma, however pt did not f/u with surgery as outpt  . Hypercalcemia     2/2 primary hyperparathyroidism  . History of nephrolithiasis 2008    s/p ureterolithotomy   . Pancreatitis 2011    history of several admissions for pancreatitis   . Alcoholism   . Chronic renal insufficiency     with history of acute on chronic secondary to dehydration and hypercalcemia  . Altered mental status 2011    hx of 2/2 hypercalcemia, EtOH w/d and medication overuse  . Hypertension   . Tobacco abuse   . Fx humeral neck 2008    right side, no history of surgical repair    Past Surgical History  Procedure Date  . Parathyroid adenoma resection Oct. 8, 2011    residual adenoma Oct. 12, 2011   Medications:  HOME MEDS:  Not reconcilable. This is from last discharge.  Prior to Admission medications   Medication Sig Start Date End Date Taking? Authorizing Provider  alendronate (FOSAMAX) 70 MG tablet Take 1 tablet (70 mg total) by mouth every 7 (seven) days. Take with a full glass of water on an empty stomach. 11/14/11  11/13/12 Yes Ivor Costa, MD  amLODipine (NORVASC) 10 MG tablet Take 1 tablet (10 mg total) by mouth daily. 11/14/11 11/13/12 Yes Ivor Costa, MD  OLANZapine (ZYPREXA) 20 MG tablet Take 20 mg by mouth at bedtime.     Yes Historical Provider, MD  traZODone (DESYREL) 100 MG tablet Take 100 mg by mouth at bedtime.     Yes Historical Provider, MD   Allergies:  No Known Allergies  Social History:   reports that he has been  smoking Cigarettes.  He does not have any smokeless tobacco history on file. He reports that he drinks alcohol. He reports that he uses illicit drugs. Not reconcilable. There is a brother with whom he apparently lives.   Family History: History reviewed. No pertinent family history.  Physical Exam: Filed Vitals:   01/04/12 2200 01/04/12 2204 01/04/12 2211 01/04/12 2300  BP: 175/110 173/103 173/103 180/79  Pulse:  94 90 78  Temp:      TempSrc:      Resp: 27 26 28 19   Height:      Weight:      SpO2:  98% 100% 98%   Blood pressure 180/79, pulse 78, temperature 99.1 F (37.3 C), temperature source Oral, resp. rate 19, height 5\' 6"  (1.676 m), weight 81.647 kg (180 lb), SpO2 98.00%.  Gen: Thin, moderately healthy M in no distress, but quite encephalopathic, can't really carry  conversation but can answer simple questions, some appropriate, most not. No cardiopulmonary  distress at present though.  HEENT: Pupils round, ~76mm, reactive, EOMI, sclera clear, normal appearing. Mouth moderately dry  appearing.  Lungs: CTAB anterolaterally, cannot sit up, no gross w/c/r but limited exam Heart: Regular, no m/g or other adventitious sounds Abd: Soft, non obese, not distended, not tender, no facial grimacing seen Extrem: Warm, thin, perfusing normally, no BLE edema, grossly normal Neuro: Alert and looking around but quite inattentive, repeats things after I saw them over and  over after I say them, disoriented. CN 2-12 grossly intact. Moves his arms spontaneously. Limited  neuro exam, but grossly non-focal   Labs & Imaging Results for orders placed during the hospital encounter of 01/04/12 (from the past 48 hour(s))  CBC     Status: Abnormal   Collection Time   01/04/12  8:00 PM      Component Value Range Comment   WBC 13.0 (*) 4.0 - 10.5 (K/uL)    RBC 4.50  4.22 - 5.81 (MIL/uL)    Hemoglobin 12.9 (*) 13.0 - 17.0 (g/dL)    HCT 38.6 (*) 39.0 - 52.0 (%)    MCV 85.8  78.0 - 100.0 (fL)    MCH  28.7  26.0 - 34.0 (pg)    MCHC 33.4  30.0 - 36.0 (g/dL)    RDW 14.6  11.5 - 15.5 (%)    Platelets 300  150 - 400 (K/uL)   DIFFERENTIAL     Status: Abnormal   Collection Time   01/04/12  8:00 PM      Component Value Range Comment   Neutrophils Relative 81 (*) 43 - 77 (%)    Neutro Abs 10.5 (*) 1.7 - 7.7 (K/uL)    Lymphocytes Relative 10 (*) 12 - 46 (%)    Lymphs Abs 1.2  0.7 - 4.0 (K/uL)    Monocytes Relative 9  3 - 12 (%)    Monocytes Absolute 1.2 (*) 0.1 - 1.0 (K/uL)    Eosinophils Relative 0  0 - 5 (%)  Eosinophils Absolute 0.0  0.0 - 0.7 (K/uL)    Basophils Relative 0  0 - 1 (%)    Basophils Absolute 0.0  0.0 - 0.1 (K/uL)   COMPREHENSIVE METABOLIC PANEL     Status: Abnormal   Collection Time   01/04/12  8:00 PM      Component Value Range Comment   Sodium 140  135 - 145 (mEq/L)    Potassium 3.9  3.5 - 5.1 (mEq/L)    Chloride 108  96 - 112 (mEq/L)    CO2 21  19 - 32 (mEq/L)    Glucose, Bld 108 (*) 70 - 99 (mg/dL)    BUN 45 (*) 6 - 23 (mg/dL)    Creatinine, Ser 3.34 (*) 0.50 - 1.35 (mg/dL)    Calcium >15.0 (*) 8.4 - 10.5 (mg/dL)    Total Protein 8.5 (*) 6.0 - 8.3 (g/dL)    Albumin 3.5  3.5 - 5.2 (g/dL)    AST 44 (*) 0 - 37 (U/L)    ALT 16  0 - 53 (U/L)    Alkaline Phosphatase 182 (*) 39 - 117 (U/L)    Total Bilirubin 0.3  0.3 - 1.2 (mg/dL)    GFR calc non Af Amer 18 (*) >90 (mL/min)    GFR calc Af Amer 21 (*) >90 (mL/min)   PROTIME-INR     Status: Normal   Collection Time   01/04/12  8:00 PM      Component Value Range Comment   Prothrombin Time 14.9  11.6 - 15.2 (seconds)    INR 1.15  0.00 - 1.49    GLUCOSE, CAPILLARY     Status: Abnormal   Collection Time   01/04/12  8:59 PM      Component Value Range Comment   Glucose-Capillary 109 (*) 70 - 99 (mg/dL)    Comment 1 Notify RN     POCT I-STAT TROPONIN I     Status: Normal   Collection Time   01/04/12  9:02 PM      Component Value Range Comment   Troponin i, poc 0.00  0.00 - 0.08 (ng/mL)    Comment 3            LACTIC  ACID, PLASMA     Status: Normal   Collection Time   01/04/12  9:23 PM      Component Value Range Comment   Lactic Acid, Venous 1.1  0.5 - 2.2 (mmol/L)   URINALYSIS, ROUTINE W REFLEX MICROSCOPIC     Status: Abnormal   Collection Time   01/04/12  9:35 PM      Component Value Range Comment   Color, Urine YELLOW  YELLOW     APPearance CLOUDY (*) CLEAR     Specific Gravity, Urine 1.016  1.005 - 1.030     pH 6.0  5.0 - 8.0     Glucose, UA NEGATIVE  NEGATIVE (mg/dL)    Hgb urine dipstick LARGE (*) NEGATIVE     Bilirubin Urine NEGATIVE  NEGATIVE     Ketones, ur NEGATIVE  NEGATIVE (mg/dL)    Protein, ur 100 (*) NEGATIVE (mg/dL)    Urobilinogen, UA 0.2  0.0 - 1.0 (mg/dL)    Nitrite NEGATIVE  NEGATIVE     Leukocytes, UA LARGE (*) NEGATIVE    URINE MICROSCOPIC-ADD ON     Status: Abnormal   Collection Time   01/04/12  9:35 PM      Component Value Range Comment   Squamous Epithelial / LPF  RARE  RARE     WBC, UA TOO NUMEROUS TO COUNT  <3 (WBC/hpf)    RBC / HPF 7-10  <3 (RBC/hpf)    Bacteria, UA MANY (*) RARE     Ct Head Wo Contrast  01/04/2012  *RADIOLOGY REPORT*  Clinical Data: Altered mental status.  CT HEAD WITHOUT CONTRAST  Technique:  Contiguous axial images were obtained from the base of the skull through the vertex without contrast.  Comparison: 10/30/2011  Findings: There are subcortical and periventricular white matter hypodensities, a nonspecific finding most often seen with chronic microangiopathic changes.  There is no evidence for acute hemorrhage, overt hydrocephalus, mass lesion, or abnormal extra-axial fluid collection. There are questionable age indeterminate lacunar infarctions, for instance within the left anterior limb internal capsule and right thalamus. No definite CT evidence for acute cortical based (large artery) infarction.  Operative changes of the left maxillary sinus anterior wall. The visualized paranasal sinuses and mastoid air cells are predominately clear.  IMPRESSION:  Moderate to advanced white matter changes, a nonspecific finding often seen with chronic microangiopathic change.  Questionable age indeterminate lacunar infarctions within the left anterior limb internal capsule and right thalamus.  No hemorrhage, mass effect, or acute cortical based (large artery) infarction.  Original Report Authenticated By: Suanne Marker, M.D.   Dg Chest Port 1 View  01/04/2012  *RADIOLOGY REPORT*  Clinical Data: Mental status changes.  CHEST - 1 VIEW  Comparison:  11/09/2011  Findings: The heart size and mediastinal contours are within normal limits.  Both lungs are clear.  IMPRESSION: No active disease.  Original Report Authenticated By: Azzie Roup, M.D.    Impression Present on Admission:  .Parathyroid related hypercalcemia .Encephalopathy, metabolic .Primary hyperparathyroidism .UTI (lower urinary tract infection) .Hypertension .Acute on chronic renal insufficiency  65yoM with h/o primary hyperparathyroidism s/p parathyroid adenoma resection 08/2010 with repeat  scan showing residual right adenoma but then lost to follow up, hyperCa, schizophrenia, alcohol  abuse presents with severe hypercalcemia and encephalopathy, UTI, acute on chronic renal  insufficiency  1. Hypercalcemic crisis: Likely due to known unresected parathyroid adenoma seen during 10/2011  admission. Unclear if he followed up with endo and surgery.   - Get Mg, Phos, and monitor lytes q6. Aggressive IVF's and once pt has been volume resuscitated,  would also give q6 Lasix, but hold on this for now in favor of IVF's. Neuro checks.  - Will give 4 units/kg calcitonin and repeat calcium, if responsive then can be repeated q6-12  hrs. Will give 90 mg IV Pamidronate (caution in renal insufficiency, but pt symptomatic)  2. Acute on chronic renal insufficiency: Previously baseilne thought to be 1.7-1.9, was 3.67 on  last admission and went down to 2.75 by discharge. Currently elevated to 3.34.  Giving IVF's and  trending.   3. UTI: Seen during last admission as well, which grew >100k Enterobacter sensitive to  ceftriaxone. Will get UCx and give CTX.   4. Social: Also unclear his home situation as he carries Dx of schizophrenia and there's no  family present, and also no family was present during last admission either. SW consultation.  5. Severe HTN: Continue home amlodipine, will give IV Labetalol boluses  6. Schizophrenia: Unclear how severe or limiting this is. Continue home trazodone, olanzapine 7. Needs formal medication reconciliation  Telemetry, [facility] team 2  Presumed full code   Other plans as per orders.  [place], Pharoah Goggins 01/05/2012, 12:47 AM

## 2012-01-05 NOTE — Progress Notes (Signed)
01/05/12 0830 The lab notified RN that Calcium was 13.9. The MD was paged. 51 MD had new orders. Will continue to monitor patient.

## 2012-01-05 NOTE — Evaluation (Signed)
Physical Therapy Evaluation Patient Details Name: [name] MRN: [id number] DOB: 09/12/1946 Today's Date: 01/05/2012  Problem List:  Patient Active Problem List  Diagnoses  . Primary hyperparathyroidism  . Hypercalcemia  . Pancreatitis, hx of   . Schizophrenia  . History of nephrolithiasis  . Nephrogenic diabetes insipidus  . Hypertension  . Alcohol abuse  . Altered mental status  . UTI (lower urinary tract infection)  . Acute on chronic renal insufficiency  . Parathyroid adenoma  . Parathyroid related hypercalcemia  . Encephalopathy, metabolic  . CKD (chronic kidney disease) stage 4, GFR 15-29 ml/min  . Tobacco abuse  . Metabolic acidosis    Past Medical History:  Past Medical History  Diagnosis Date  . Schizophrenia     previously on zyprexa  . Primary hyperparathyroidism     s/p parathyroid adenoma resection Oct. 8, 2011, repeat scan showed residual right adenoma, however pt did not f/u with surgery as outpt  . Hypercalcemia     2/2 primary hyperparathyroidism  . History of nephrolithiasis 2008    s/p ureterolithotomy   . Pancreatitis 2011    history of several admissions for pancreatitis   . Alcoholism   . Chronic renal insufficiency     with history of acute on chronic secondary to dehydration and hypercalcemia  . Altered mental status 2011    hx of 2/2 hypercalcemia, EtOH w/d and medication overuse  . Hypertension   . Tobacco abuse   . Fx humeral neck 2008    right side, no history of surgical repair  . Encephalopathy    Past Surgical History:  Past Surgical History  Procedure Date  . Parathyroid adenoma resection Oct. 8, 2011    residual adenoma Oct. 12, 2011  . Ankle pinning   . Open left proximal ureterolithotomy 09/18/2007  . Cystoscopy, laser cystolithoplaxy 08/16/2007  . Cysto, removal of bladder stone, attempted stent placement 08/15/2007    PT Assessment/Plan/Recommendation PT Assessment Clinical Impression Statement: Patient with h/o  hyperparathyroidism admitted with hypercalcemia presentes wtih decreased independence with ambulation and balance deficit.  Difficult to know amount of help brother can provide at home.  Will need social assessment to determine if can return home with HHPT or needs STSNF level of care.  Will benefit from skilled PT in acute setting to maximize independence and safety for least restrictive discharge plan available. PT Recommendation/Assessment: Patient will need skilled PT in the acute care venue PT Problem List: Decreased balance;Decreased mobility;Decreased safety awareness;Decreased knowledge of use of DME;Decreased cognition PT Therapy Diagnosis : Difficulty walking;Abnormality of gait;Generalized weakness PT Plan PT Frequency: Min 3X/week PT Treatment/Interventions: DME instruction;Gait training;Functional mobility training;Stair training;Therapeutic activities;Therapeutic exercise;Balance training;Patient/family education PT Recommendation Follow Up Recommendations: Home health PT;Skilled nursing facility;Supervision/Assistance - 24 hour (Will need SNF level rehab if 24 hour assist not available) Equipment Recommended: Defer to next venue PT Goals  Acute Rehab PT Goals PT Goal Formulation: Patient unable to participate in goal setting Time For Goal Achievement: 2 weeks Pt will go Sit to Stand: with modified independence PT Goal: Sit to Stand - Progress: Goal set today Pt will go Stand to Sit: with modified independence PT Goal: Stand to Sit - Progress: Goal set today Pt will Stand: with modified independence;with unilateral upper extremity support;3 - 5 min (during functional activity) PT Goal: Stand - Progress: Goal set today Pt will Ambulate: 51 - 150 feet;with least restrictive assistive device;with modified independence PT Goal: Ambulate - Progress: Goal set today Pt will Go Up / Down  Stairs: 3-5 stairs;with rail(s);with supervision PT Goal: Up/Down Stairs - Progress: Goal set  today  PT Evaluation Precautions/Restrictions  Precautions Precautions: Fall Prior Functioning  Home Living Lives With: Family Receives Help From: Family Additional Comments: Patient lives with brother.  Unable to provide reliable information due to AMS. Prior Function Comments:  Unable to provide reliable information due to AMS. Cognition Cognition Arousal/Alertness: Awake/alert Overall Cognitive Status: Impaired Attention: Impaired Current Attention Level: Focused Attention - Other Comments: able to attend to task for few seconds getting socks on.  Easily distracted by blood pressure cuff and unable to complete answering question regarding how his brother assists him at home. Memory: Appears impaired Memory Deficits: was able to remember after 5 minutes I had told him he was at [facility], further assessment deferred Orientation Level: Disoriented to place;Disoriented to time;Disoriented to situation;Oriented to person Following Commands: Follows one step commands with increased time Safety/Judgement: Decreased awareness of safety precautions;Decreased safety judgement for tasks assessed Decreased Safety/Judgement: Decreased awareness of need for assistance Safety/Judgement - Other Comments: once he decided to get up he moved quickly without regards to IV or cathter or bed rail in his way Cognition - Other Comments: Knew he was in the hospital, thought was at [facility], unable to state year or reason for admission, knew his brother's name Sensation/Coordination   Extremity Assessment RUE Assessment RUE Assessment: Within Functional Limits (AROM WFL, unable to follow commands for MMT) LUE Assessment LUE Assessment: Within Functional Limits (AROM WFL, unable to follow commands for MMT) RLE Assessment RLE Assessment: Within Functional Limits (AROM WFL, unable to follow commands for MMT) LLE Assessment LLE Assessment: Within Functional Limits (AROM WFL, unable to follow commands for  MMT) Mobility (including Balance) Bed Mobility Bed Mobility: Yes Supine to Sit: 5: Supervision Supine to Sit Details (indicate cue type and reason): for safety due to getting up with IV & catheter in wrong place and bed rail in the way Sit to Supine: 4: Min assist Sit to Supine - Details (indicate cue type and reason): for tactile cues to get feet in the bed Transfers Transfers: Yes Sit to Stand: 4: Min assist;From bed Sit to Stand Details (indicate cue type and reason): Patient attempted unassisted, needed 10% help to stand Stand to Sit: 4: Min assist;To bed Stand to Sit Details: assist for safety Ambulation/Gait Ambulation/Gait: Yes Ambulation/Gait Assistance: 4: Min assist Ambulation/Gait Assistance Details (indicate cue type and reason): holding patient's left hand and managing balance due to decreased step length and increased base of support with high fall risk noted Ambulation Distance (Feet): 45 Feet Assistive device: 1 person hand held assist Gait Pattern: Decreased stride length  Balance Balance Assessed: Yes Static Standing Balance Static Standing - Balance Support: Left upper extremity supported Static Standing - Level of Assistance: 4: Min assist Exercise    End of Session PT - End of Session Equipment Utilized During Treatment: Gait belt Activity Tolerance: Patient tolerated treatment well Patient left: in bed;with bed alarm set;with call bell in reach Nurse Communication: Mobility status for ambulation General Behavior During Session: [facility] for tasks performed Cognition: Impaired  Daphane Odekirk,CYNDI 01/05/2012, 12:16 PM

## 2012-01-05 NOTE — Progress Notes (Signed)
HART REVIEWED NEXT REVIEW DUE [id number]

## 2012-01-05 NOTE — Consult Note (Signed)
Reason for Consult:Right Parathyroid adenoma with recurrent hypercalcemia and encephalopathy. Referring Physician: Rama  KAREE [name] is an 66 y.o. male.  HPI: Patient is a 66 year old African American male with a diagnosis of schizophrenia. He has a history of parathyroid adenoma resection 09/08/2010 by Dr. Johnathan Hausen. Postoperative parathyroid study showed a focal activity in the right lobe significant for probable residual adenoma. He had instructions to followup as an outpatient, but has never done so. He was hospitalized December 2012, with encephalopathy, hypercalcemia, acute on chronic renal insufficiency, urinary tract, alcohol abuse, ongoing schizophrenia. He was seen at that time and it was recommended that he followup on an outpatient basis after his acute medical issues have resolved. He returned again on 01/05/2012 with the same problem. He has, altered mental status, he is weak, calcium was greater than 15 on admission, creatinine is back up to 3.34. WBC 13,000, urinalysis shows multiple white cells, and bacteria. He has a  diagnoses of hypercalcemic crisis, encephalopathy, primary hyperparathyroidism, hypertension, acute on chronic renal insufficiency, and schizophrenia. We were asked to see the patient again for possible inpatient therapy of his recurrent parathyroid adenoma because of recurrent failure to followup as an outpatient. He has never returned to the office for any surgical follow-up.  Past Medical History  Diagnosis Date  . Schizophrenia     previously on zyprexa  . Primary hyperparathyroidism     s/p parathyroid adenoma resection Oct. 8, 2011, repeat scan showed residual right adenoma, however pt did not f/u with surgery as outpt  . Hypercalcemia     2/2 primary hyperparathyroidism  . History of nephrolithiasis 2008    s/p ureterolithotomy   . Pancreatitis 2011    history of several admissions for pancreatitis   . Alcoholism   . Chronic renal insufficiency       with history of acute on chronic secondary to dehydration and hypercalcemia  . Altered mental status 2011    hx of 2/2 hypercalcemia, EtOH w/d and medication overuse  . Hypertension   . Tobacco abuse   . Fx humeral neck 2008    right side, no history of surgical repair  . Encephalopathy     Past Surgical History  Procedure Date  . Parathyroid adenoma resection Oct. 8, 2011    residual adenoma Oct. 12, 2011  . Ankle pinning   . Open left proximal ureterolithotomy 09/18/2007  . Cystoscopy, laser cystolithoplaxy 08/16/2007  . Cysto, removal of bladder stone, attempted stent placement 08/15/2007    History reviewed. No pertinent family history.  Social History:  reports that he has been smoking Cigarettes.  He has a 45 pack-year smoking history. He has never used smokeless tobacco. He reports that he drinks alcohol. He reports that he uses illicit drugs.  Allergies: No Known Allergies  Medications:  Prior to Admission:  Prescriptions prior to admission  Medication Sig Dispense Refill  . alendronate (FOSAMAX) 70 MG tablet Take 1 tablet (70 mg total) by mouth every 7 (seven) days. Take with a full glass of water on an empty stomach.  30 tablet  2  . amLODipine (NORVASC) 10 MG tablet Take 1 tablet (10 mg total) by mouth daily.  30 tablet  3  . OLANZapine (ZYPREXA) 20 MG tablet Take 20 mg by mouth at bedtime.        . traZODone (DESYREL) 100 MG tablet Take 100 mg by mouth at bedtime.         Scheduled:   . amLODipine  10 mg  Oral Daily  . calcitonin  4 Units/kg Intramuscular Once  . cefTRIAXone (ROCEPHIN)  IV  1 g Intravenous Once  . cefTRIAXone (ROCEPHIN)  IV  1 g Intravenous QHS  . cloNIDine  0.1 mg Oral BID  . docusate sodium  100 mg Oral BID  . heparin  5,000 Units Subcutaneous Q8H  . OLANZapine  20 mg Oral QHS  . ondansetron  4 mg Intravenous Once  . pamidronate  90 mg Intravenous Once  . senna  1 tablet Oral BID  . sodium bicarbonate  650 mg Oral TID  . sodium chloride   1,000 mL Intravenous Once  . sodium chloride  3 mL Intravenous Q12H  . traZODone  100 mg Oral QHS   Continuous:   . sodium chloride 150 mL/hr at 01/05/12 1025  . DISCONTD: sodium chloride 125 mL/hr at 01/04/12 2235   [id number], acetaminophen, labetalol, ondansetron (ZOFRAN) IV, ondansetron  Results for orders placed during the hospital encounter of 01/04/12 (from the past 48 hour(s))  CBC     Status: Abnormal   Collection Time   01/04/12  8:00 PM      Component Value Range Comment   WBC 13.0 (*) 4.0 - 10.5 (K/uL)    RBC 4.50  4.22 - 5.81 (MIL/uL)    Hemoglobin 12.9 (*) 13.0 - 17.0 (g/dL)    HCT 38.6 (*) 39.0 - 52.0 (%)    MCV 85.8  78.0 - 100.0 (fL)    MCH 28.7  26.0 - 34.0 (pg)    MCHC 33.4  30.0 - 36.0 (g/dL)    RDW 14.6  11.5 - 15.5 (%)    Platelets 300  150 - 400 (K/uL)   DIFFERENTIAL     Status: Abnormal   Collection Time   01/04/12  8:00 PM      Component Value Range Comment   Neutrophils Relative 81 (*) 43 - 77 (%)    Neutro Abs 10.5 (*) 1.7 - 7.7 (K/uL)    Lymphocytes Relative 10 (*) 12 - 46 (%)    Lymphs Abs 1.2  0.7 - 4.0 (K/uL)    Monocytes Relative 9  3 - 12 (%)    Monocytes Absolute 1.2 (*) 0.1 - 1.0 (K/uL)    Eosinophils Relative 0  0 - 5 (%)    Eosinophils Absolute 0.0  0.0 - 0.7 (K/uL)    Basophils Relative 0  0 - 1 (%)    Basophils Absolute 0.0  0.0 - 0.1 (K/uL)   COMPREHENSIVE METABOLIC PANEL     Status: Abnormal   Collection Time   01/04/12  8:00 PM      Component Value Range Comment   Sodium 140  135 - 145 (mEq/L)    Potassium 3.9  3.5 - 5.1 (mEq/L)    Chloride 108  96 - 112 (mEq/L)    CO2 21  19 - 32 (mEq/L)    Glucose, Bld 108 (*) 70 - 99 (mg/dL)    BUN 45 (*) 6 - 23 (mg/dL)    Creatinine, Ser 3.34 (*) 0.50 - 1.35 (mg/dL)    Calcium >15.0 (*) 8.4 - 10.5 (mg/dL)    Total Protein 8.5 (*) 6.0 - 8.3 (g/dL)    Albumin 3.5  3.5 - 5.2 (g/dL)    AST 44 (*) 0 - 37 (U/L)    ALT 16  0 - 53 (U/L)    Alkaline Phosphatase 182 (*) 39 - 117 (U/L)     Total Bilirubin 0.3  0.3 - 1.2 (  mg/dL)    GFR calc non Af Amer 18 (*) >90 (mL/min)    GFR calc Af Amer 21 (*) >90 (mL/min)   PROTIME-INR     Status: Normal   Collection Time   01/04/12  8:00 PM      Component Value Range Comment   Prothrombin Time 14.9  11.6 - 15.2 (seconds)    INR 1.15  0.00 - 1.49    GLUCOSE, CAPILLARY     Status: Abnormal   Collection Time   01/04/12  8:59 PM      Component Value Range Comment   Glucose-Capillary 109 (*) 70 - 99 (mg/dL)    Comment 1 Notify RN     POCT I-STAT TROPONIN I     Status: Normal   Collection Time   01/04/12  9:02 PM      Component Value Range Comment   Troponin i, poc 0.00  0.00 - 0.08 (ng/mL)    Comment 3            LACTIC ACID, PLASMA     Status: Normal   Collection Time   01/04/12  9:23 PM      Component Value Range Comment   Lactic Acid, Venous 1.1  0.5 - 2.2 (mmol/L)   URINALYSIS, ROUTINE W REFLEX MICROSCOPIC     Status: Abnormal   Collection Time   01/04/12  9:35 PM      Component Value Range Comment   Color, Urine YELLOW  YELLOW     APPearance CLOUDY (*) CLEAR     Specific Gravity, Urine 1.016  1.005 - 1.030     pH 6.0  5.0 - 8.0     Glucose, UA NEGATIVE  NEGATIVE (mg/dL)    Hgb urine dipstick LARGE (*) NEGATIVE     Bilirubin Urine NEGATIVE  NEGATIVE     Ketones, ur NEGATIVE  NEGATIVE (mg/dL)    Protein, ur 100 (*) NEGATIVE (mg/dL)    Urobilinogen, UA 0.2  0.0 - 1.0 (mg/dL)    Nitrite NEGATIVE  NEGATIVE     Leukocytes, UA LARGE (*) NEGATIVE    URINE MICROSCOPIC-ADD ON     Status: Abnormal   Collection Time   01/04/12  9:35 PM      Component Value Range Comment   Squamous Epithelial / LPF RARE  RARE     WBC, UA TOO NUMEROUS TO COUNT  <3 (WBC/hpf)    RBC / HPF 7-10  <3 (RBC/hpf)    Bacteria, UA MANY (*) RARE    COMPREHENSIVE METABOLIC PANEL     Status: Abnormal   Collection Time   01/05/12  1:45 AM      Component Value Range Comment   Sodium 137  135 - 145 (mEq/L)    Potassium 4.3  3.5 - 5.1 (mEq/L)    Chloride 108  96 - 112  (mEq/L)    CO2 18 (*) 19 - 32 (mEq/L)    Glucose, Bld 91  70 - 99 (mg/dL)    BUN 44 (*) 6 - 23 (mg/dL)    Creatinine, Ser 3.33 (*) 0.50 - 1.35 (mg/dL)    Calcium >15.0 (*) 8.4 - 10.5 (mg/dL)    Total Protein 7.9  6.0 - 8.3 (g/dL)    Albumin 3.3 (*) 3.5 - 5.2 (g/dL)    AST 40 (*) 0 - 37 (U/L)    ALT 14  0 - 53 (U/L)    Alkaline Phosphatase 176 (*) 39 - 117 (U/L)    Total Bilirubin  0.3  0.3 - 1.2 (mg/dL)    GFR calc non Af Amer 18 (*) >90 (mL/min)    GFR calc Af Amer 21 (*) >90 (mL/min)   MAGNESIUM     Status: Normal   Collection Time   01/05/12  1:45 AM      Component Value Range Comment   Magnesium 2.5  1.5 - 2.5 (mg/dL)   PHOSPHORUS     Status: Normal   Collection Time   01/05/12  1:45 AM      Component Value Range Comment   Phosphorus 3.9  2.3 - 4.6 (mg/dL)   COMPREHENSIVE METABOLIC PANEL     Status: Abnormal   Collection Time   01/05/12  7:42 AM      Component Value Range Comment   Sodium 140  135 - 145 (mEq/L)    Potassium 4.1  3.5 - 5.1 (mEq/L)    Chloride 112  96 - 112 (mEq/L)    CO2 17 (*) 19 - 32 (mEq/L)    Glucose, Bld 110 (*) 70 - 99 (mg/dL)    BUN 44 (*) 6 - 23 (mg/dL)    Creatinine, Ser 3.17 (*) 0.50 - 1.35 (mg/dL)    Calcium 13.9 (*) 8.4 - 10.5 (mg/dL)    Total Protein 7.6  6.0 - 8.3 (g/dL)    Albumin 3.1 (*) 3.5 - 5.2 (g/dL)    AST 41 (*) 0 - 37 (U/L) SLIGHT HEMOLYSIS   ALT 15  0 - 53 (U/L)    Alkaline Phosphatase 161 (*) 39 - 117 (U/L)    Total Bilirubin 0.3  0.3 - 1.2 (mg/dL)    GFR calc non Af Amer 19 (*) >90 (mL/min)    GFR calc Af Amer 22 (*) >90 (mL/min)   CBC     Status: Abnormal   Collection Time   01/05/12  7:42 AM      Component Value Range Comment   WBC 12.3 (*) 4.0 - 10.5 (K/uL)    RBC 4.11 (*) 4.22 - 5.81 (MIL/uL)    Hemoglobin 11.7 (*) 13.0 - 17.0 (g/dL)    HCT 35.5 (*) 39.0 - 52.0 (%)    MCV 86.4  78.0 - 100.0 (fL)    MCH 28.5  26.0 - 34.0 (pg)    MCHC 33.0  30.0 - 36.0 (g/dL)    RDW 14.7  11.5 - 15.5 (%)    Platelets 234  150 - 400 (K/uL)      Ct Head Wo Contrast  01/04/2012  *RADIOLOGY REPORT*  Clinical Data: Altered mental status.  CT HEAD WITHOUT CONTRAST  Technique:  Contiguous axial images were obtained from the base of the skull through the vertex without contrast.  Comparison: 10/30/2011  Findings: There are subcortical and periventricular white matter hypodensities, a nonspecific finding most often seen with chronic microangiopathic changes.  There is no evidence for acute hemorrhage, overt hydrocephalus, mass lesion, or abnormal extra-axial fluid collection. There are questionable age indeterminate lacunar infarctions, for instance within the left anterior limb internal capsule and right thalamus. No definite CT evidence for acute cortical based (large artery) infarction.  Operative changes of the left maxillary sinus anterior wall. The visualized paranasal sinuses and mastoid air cells are predominately clear.  IMPRESSION: Moderate to advanced white matter changes, a nonspecific finding often seen with chronic microangiopathic change.  Questionable age indeterminate lacunar infarctions within the left anterior limb internal capsule and right thalamus.  No hemorrhage, mass effect, or acute cortical based (large artery) infarction.  Original  Report Authenticated By: Suanne Marker, M.D.   Dg Chest Port 1 View  01/04/2012  *RADIOLOGY REPORT*  Clinical Data: Mental status changes.  CHEST - 1 VIEW  Comparison:  11/09/2011  Findings: The heart size and mediastinal contours are within normal limits.  Both lungs are clear.  IMPRESSION: No active disease.  Original Report Authenticated By: Azzie Roup, M.D.    Review of Systems  Unable to perform ROS: mental status change   Blood pressure 162/90, pulse 98, temperature 99.7 F (37.6 C), temperature source Oral, resp. rate 18, height 5\' 6"  (1.676 m), weight 81.647 kg (180 lb), SpO2 96.00%. Physical Exam  Constitutional: He appears well-developed. No distress.       Pt alert and  speaking, but cannot really answer question.  He was able to tell me he was in hospital, but not [facility].  He can't really tell me why, he repeats phrases, like the year is 20,20,20... When ask why he was here he say he can't remember.  When ask if he hurts, he points to right neck.  HENT:  Head: Normocephalic and atraumatic.  Nose: Nose normal.  Eyes: Conjunctivae and EOM are normal. Pupils are equal, round, and reactive to light.  Neck: Normal range of motion. Neck supple. No JVD present. No tracheal deviation present. Thyromegaly (nodule on Right.) present.  Cardiovascular: Normal rate, regular rhythm, normal heart sounds and intact distal pulses.  Exam reveals no gallop and no friction rub.   No murmur heard. Respiratory: Effort normal and breath sounds normal. No respiratory distress. He has no wheezes. He has no rales. He exhibits no tenderness.  GI: He exhibits no distension and no mass. There is no tenderness. There is no rebound and no guarding.  Musculoskeletal: He exhibits no edema and no tenderness.  Lymphadenopathy:    He has no cervical adenopathy.  Neurological: He is alert. No cranial nerve deficit.       He is poorly oriented, cannot really answer many questions.  He says he does not remember.  Skin: Skin is warm and dry. No rash noted. No erythema.  Psychiatric: He has a normal mood and affect.       He cannot remember why he is here or what is wrong.  He knows he lives with brother, but can't tell me if he works, or is at home.  Or who cooks meals for him.    Assessment/Plan: 1.Primary Hyperparathyroidism, hx of resection of Parathyroid adenoma on right,and recurrent parathyroid adenoma. 2. Hypercalcemia crisis, with recurrent encephalopathy and altered mental status. 3. Recurrent acute on chronic renal insufficiency with a creatinine 3.33 on this admission 4. Recurrent urinary tract intact. 5. Schizophrenia, with uncertain social situation.  6.Hypertension 7.Hx of ETOH and  tobacco use. 8. Hx of pancreatitis 9.History of nephrolithiasis. 10. History of noncompliance, possibly related to #5.  Plan:  Agree with medical management. Will review with DR.Makira Holleman. Will [facility] [id number]  01/05/2012 1:28 PM    JENNINGS,[name] 01/05/2012, 12:52 PM   Agree with above.  Patient known to Dr. Hassell Done, who will be back in town next Monday.  I do not perform parathyroid surgery and there is no urgent surgical indication for intervention, other than non-compliance and psychiatric issues.  Medical management and will discuss with Dr. Hassell Done next week.  Imogene Burn. Georgette Dover, MD, [facility]  01/05/2012 1:45 PM

## 2012-01-06 LAB — BASIC METABOLIC PANEL
BUN: 38 mg/dL — ABNORMAL HIGH (ref 6–23)
Creatinine, Ser: 2.97 mg/dL — ABNORMAL HIGH (ref 0.50–1.35)
GFR calc Af Amer: 24 mL/min — ABNORMAL LOW (ref >90)
GFR calc non Af Amer: 21 mL/min — ABNORMAL LOW (ref >90)

## 2012-01-06 LAB — CBC
MCHC: 33 g/dL (ref 30.0–36.0)
RDW: 14.5 % (ref 11.5–15.5)

## 2012-01-06 MED ORDER — LABETALOL HCL 100 MG PO TABS
100.0000 mg | ORAL_TABLET | Freq: Two times a day (BID) | ORAL | Status: DC
  Administered 2012-01-06 – 2012-01-07 (×3): 100 mg via ORAL
  Filled 2012-01-06 (×6): qty 1

## 2012-01-06 MED ORDER — FUROSEMIDE 10 MG/ML IJ SOLN
40.0000 mg | Freq: Every day | INTRAMUSCULAR | Status: DC
  Administered 2012-01-06 – 2012-01-07 (×2): 40 mg via INTRAVENOUS
  Filled 2012-01-06 (×3): qty 4

## 2012-01-06 NOTE — Progress Notes (Signed)
CSW visited with the pt and completed a psychosocial assessment (pls see shadow chart). Pt reports he lives at home with his brother [name] and his brother is very supportive. CSW spoke to pt about discharge planning and the pt reports he plans to return home with brother. CSW was given permission to contact the pts brother, however, every number we have listed is disconnected and the pt reports he does not know a number to reach his brother at. Pt very pleasant and confused. CSW will continue to try to reach the pts family. CSW will continue to follow the pt and offer support.  Zollie Scale 01/06/2012 12:57 PM [id number]

## 2012-01-06 NOTE — Progress Notes (Signed)
PATIENT DETAILS Name: [name] Age: 66 y.o. Sex: male Date of Birth: [birth year] Admit Date: 01/04/2012 PCP:No primary provider on file. Emergency contact:   CONSULTS: 1.  consult noted  Interval History: [name] is a 66 year old male with h/o primary hyperparathyroidism s/p parathyroid adenoma resection 08/2010, admission 10/2011 showing persistent active parathyroid adenoma, hyperCa, schizophrenia, alcohol abuse who presented to the ER on 01/05/12 with severe hypercalcemia and encephalopathy, UTI, acute on chronic renal insufficiency.   Patient is without any significant complaints today. He is alert to place and he knows the name of the president. He had difficulty with the year.   Objective: Vital signs in last 24 hours: Temp:  [98.2 F (36.8 C)-100.6 F (38.1 C)] 98.3 F (36.8 C) (02/08 0911) Pulse Rate:  [70-81] 70  (02/08 0911) Resp:  [18-20] 18  (02/08 0911) BP: (133-172)/(83-103) 133/83 mmHg (02/08 0911) SpO2:  [97 %-99 %] 99 % (02/08 0911) Weight change:  Last BM Date:  (unknown)  Intake/Output from previous day:  Intake/Output Summary (Last 24 hours) at 01/06/12 1003 Last data filed at 01/06/12 0917  Gross per 24 hour  Intake 4493.5 ml  Output   2575 ml  Net 1918.5 ml     Physical Exam:  Gen:  NAD Cardiovascular:  RRR, No M/R/G Respiratory: Lungs CTAB Gastrointestinal: Abdomen soft, NT/ND with normal active bowel sounds. Extremities: No C/E/C     Lab Results: Basic Metabolic Panel:  Lab 0000000 0420 01/05/12 2103 01/05/12 1403 01/05/12 0742 01/05/12 0145  NA 138 137 135 140 137  K 3.5 3.5 -- -- --  CL 109 110 108 112 108  CO2 18* 19 19 17* 18*  GLUCOSE 80 72 98 110* 91  BUN 38* 40* 42* 44* 44*  CREATININE 2.97* 2.99* 3.12* 3.17* 3.33*  CALCIUM 11.8* 12.0* 12.9* 13.9* >15.0*  MG -- -- -- -- 2.5  PHOS -- -- -- -- 3.9   GFR Estimated Creatinine Clearance: 25.2 ml/min (by C-G formula based on Cr of 2.97). Liver Function  Tests:  Lab 01/05/12 2103 01/05/12 1403 01/05/12 0742 01/05/12 0145 01/04/12 2000  AST 40* 38* 41* 40* 44*  ALT 16 16 15 14 16   ALKPHOS 144* 150* 161* 176* 182*  BILITOT 0.3 0.3 0.3 0.3 0.3  PROT 7.0 7.2 7.6 7.9 8.5*  ALBUMIN 2.9* 3.0* 3.1* 3.3* 3.5   Coagulation profile  Lab 01/04/12 2000  INR 1.15  PROTIME --    CBC:  Lab 01/06/12 0420 01/05/12 0742 01/04/12 2000  WBC 8.8 12.3* 13.0*  NEUTROABS -- -- 10.5*  HGB 10.3* 11.7* 12.9*  HCT 31.2* 35.5* 38.6*  MCV 85.2 86.4 85.8  PLT 235 234 300   CBG:  Lab 01/04/12 2059  GLUCAP 109*    Studies/Results: Ct Head Wo Contrast  01/04/2012  *RADIOLOGY REPORT*  Clinical Data: Altered mental status.  CT HEAD WITHOUT CONTRAST  Technique:  Contiguous axial images were obtained from the base of the skull through the vertex without contrast.  Comparison: 10/30/2011  Findings: There are subcortical and periventricular white matter hypodensities, a nonspecific finding most often seen with chronic microangiopathic changes.  There is no evidence for acute hemorrhage, overt hydrocephalus, mass lesion, or abnormal extra-axial fluid collection. There are questionable age indeterminate lacunar infarctions, for instance within the left anterior limb internal capsule and right thalamus. No definite CT evidence for acute cortical based (large artery) infarction.  Operative changes of the left maxillary sinus anterior wall. The visualized paranasal sinuses and mastoid  air cells are predominately clear.  IMPRESSION: Moderate to advanced white matter changes, a nonspecific finding often seen with chronic microangiopathic change.  Questionable age indeterminate lacunar infarctions within the left anterior limb internal capsule and right thalamus.  No hemorrhage, mass effect, or acute cortical based (large artery) infarction.  Original Report Authenticated By: Suanne Marker, M.D.   Dg Chest Port 1 View  01/04/2012  *RADIOLOGY REPORT*  Clinical Data: Mental  status changes.  CHEST - 1 VIEW  Comparison:  11/09/2011  Findings: The heart size and mediastinal contours are within normal limits.  Both lungs are clear.  IMPRESSION: No active disease.  Original Report Authenticated By: Azzie Roup, M.D.    Medications: Scheduled Meds:    . sodium chloride   Intravenous STAT  . amLODipine  10 mg Oral Daily  . cefTRIAXone (ROCEPHIN)  IV  1 g Intravenous QHS  . docusate sodium  100 mg Oral BID  . furosemide  40 mg Intravenous Daily  . heparin  5,000 Units Subcutaneous Q8H  . labetalol  100 mg Oral BID  . OLANZapine  20 mg Oral QHS  . senna  1 tablet Oral BID  . sodium bicarbonate  650 mg Oral TID  . sodium chloride  3 mL Intravenous Q12H  . traZODone  100 mg Oral QHS  . DISCONTD: cloNIDine  0.1 mg Oral BID   Continuous Infusions:    . sodium chloride 150 mL/hr at 01/06/12 0416   PRN Meds:.acetaminophen, acetaminophen, labetalol, ondansetron (ZOFRAN) IV, ondansetron, DISCONTD: ondansetron (ZOFRAN) IV Antibiotics: Anti-infectives     Start     Dose/Rate Route Frequency Ordered Stop   01/05/12 2200   cefTRIAXone (ROCEPHIN) 1 g in dextrose 5 % 50 mL IVPB        1 g 100 mL/hr over 30 Minutes Intravenous Daily at bedtime 01/05/12 0054     01/04/12 2230   cefTRIAXone (ROCEPHIN) 1 g in dextrose 5 % 50 mL IVPB        1 g 100 mL/hr over 30 Minutes Intravenous  Once 01/04/12 2225 01/04/12 2305           Assessment/Plan:  Principal Problem: Parathyroid related hypercalcemia / Primary hyperparathyroidism The patient was admitted and put on aggressive IVFs.  He was given 4 units/kg of calcitonin and 90 mg of Pamidronate.  His calcium levels continued to decrease.  Renal function still impaired but is improving. Because of his impaired renal function and he is receiving IVF, will restart Lasix to prevent pulmonary edema. He is 2 L positive today.  parathyroid scan on 11/11/11 which confirmed persistent right inferior parathyroid adenoma, and  was recommend to have outpt surgery,   Surgery consult noted. No surgery will be done this week. Patient's mental status is improving. His calcium is improving. Will continue the calcitonin IV fluids and Lasix.  Active Problems:  Hypertension Suboptimal control on Norvasc.  Clonidine was added yesterday. Discontinued the Clonidine with his history of noncompliance. Add labetalol  twice daily.   UTI (lower urinary tract infection) On Rocephin day # 2; F/U Urine cultures.   Acute on chronic renal insufficiency / Stage 4 CKD / Metabolic acidosis. Baseline creatinine 2.75 (GFR 21-26) consistent with stage IV CKD.  Hydrating, but do not expect much improvement given baseline renal function.     Encephalopathy, metabolic Secondary to hypercalcemia.  Improving.  As mentioned above patient alert and oriented to place.    Schizophrenia Continue Zyprexa and Trazodone.   Tobacco Abuse  Tobacco cessation information per nursing will be provided.    LOS: 2 days   Sylvester Harder, MD Pager [phone]  01/06/2012, 10:03 AM

## 2012-01-07 LAB — COMPREHENSIVE METABOLIC PANEL
ALT: 15 U/L (ref 0–53)
AST: 32 U/L (ref 0–37)
Albumin: 2.8 g/dL — ABNORMAL LOW (ref 3.5–5.2)
CO2: 19 mEq/L (ref 19–32)
Calcium: 10.6 mg/dL — ABNORMAL HIGH (ref 8.4–10.5)
Chloride: 111 mEq/L (ref 96–112)
GFR calc non Af Amer: 23 mL/min — ABNORMAL LOW (ref >90)
Sodium: 140 mEq/L (ref 135–145)

## 2012-01-07 LAB — CBC
MCH: 27.9 pg (ref 26.0–34.0)
Platelets: 256 10*3/uL (ref 150–400)
RBC: 3.69 MIL/uL — ABNORMAL LOW (ref 4.22–5.81)
RDW: 14.3 % (ref 11.5–15.5)
WBC: 9.5 10*3/uL (ref 4.0–10.5)

## 2012-01-07 LAB — URINE CULTURE
Colony Count: 25000
Culture  Setup Time: 201302080108

## 2012-01-07 MED ORDER — LABETALOL HCL 100 MG PO TABS: 100.0000 mg | ORAL_TABLET | Freq: Two times a day (BID) | ORAL | Status: DC

## 2012-01-07 NOTE — Discharge Summary (Signed)
Physician Discharge Summary  Patient ID: [name] MRN: [id number] DOB/AGE: 1946/04/21 66 y.o.  Admit date: 01/04/2012 Discharge date: 01/07/2012  Primary Care Physician:  No primary provider on file.   Discharge Diagnoses:    Principal Problem:  *Parathyroid related hypercalcemia Active Problems:  Primary hyperparathyroidism  Schizophrenia  Hypertension  UTI (lower urinary tract infection)  Acute on chronic renal insufficiency  Encephalopathy, metabolic  CKD (chronic kidney disease) stage 4, GFR 15-29 ml/min  Tobacco abuse  Metabolic acidosis    Medication List  As of 01/07/2012 11:27 AM   TAKE these medications         alendronate 70 MG tablet   Commonly known as: FOSAMAX   Take 1 tablet (70 mg total) by mouth every 7 (seven) days. Take with a full glass of water on an empty stomach.      amLODipine 10 MG tablet   Commonly known as: NORVASC   Take 1 tablet (10 mg total) by mouth daily.      labetalol 100 MG tablet   Commonly known as: NORMODYNE   Take 1 tablet (100 mg total) by mouth 2 (two) times daily.      OLANZapine 20 MG tablet   Commonly known as: ZYPREXA   Take 20 mg by mouth at bedtime.      traZODone 100 MG tablet   Commonly known as: DESYREL   Take 100 mg by mouth at bedtime.             Disposition and Follow-up:  The patient is medically ready for discharge home today. Has been instructed to followup with his surgeon, Dr. Hassell Done, and with his primary care physician in 2-3 weeks.  Consults:  general surgery Dr. Georgette Dover   Significant Diagnostic Studies:  Ct Head Wo Contrast  01/04/2012  *RADIOLOGY REPORT*  Clinical Data: Altered mental status.  CT HEAD WITHOUT CONTRAST  Technique:  Contiguous axial images were obtained from the base of the skull through the vertex without contrast.  Comparison: 10/30/2011  Findings: There are subcortical and periventricular white matter hypodensities, a nonspecific finding most often seen with chronic  microangiopathic changes.  There is no evidence for acute hemorrhage, overt hydrocephalus, mass lesion, or abnormal extra-axial fluid collection. There are questionable age indeterminate lacunar infarctions, for instance within the left anterior limb internal capsule and right thalamus. No definite CT evidence for acute cortical based (large artery) infarction.  Operative changes of the left maxillary sinus anterior wall. The visualized paranasal sinuses and mastoid air cells are predominately clear.  IMPRESSION: Moderate to advanced white matter changes, a nonspecific finding often seen with chronic microangiopathic change.  Questionable age indeterminate lacunar infarctions within the left anterior limb internal capsule and right thalamus.  No hemorrhage, mass effect, or acute cortical based (large artery) infarction.  Original Report Authenticated By: Suanne Marker, M.D.   Dg Chest Port 1 View  01/04/2012  *RADIOLOGY REPORT*  Clinical Data: Mental status changes.  CHEST - 1 VIEW  Comparison:  11/09/2011  Findings: The heart size and mediastinal contours are within normal limits.  Both lungs are clear.  IMPRESSION: No active disease.  Original Report Authenticated By: Azzie Roup, M.D.    Brief H and P: For complete details please refer to admission H and P, but in brief 89yoM with h/o primary hyperparathyroidism s/p parathyroid adenoma resection 08/2010, admission  10/2011 showing persistent active parathyroid adenoma, hyperCa, schizophrenia, alcohol abuse  presents with severe hypercalcemia and encephalopathy, UTI, acute on chronic  renal insufficiency.      Hospital Course:  Principal Problem:  *Parathyroid related hypercalcemia Active Problems:  Primary hyperparathyroidism  Schizophrenia  Hypertension  UTI (lower urinary tract infection)  Acute on chronic renal insufficiency  Encephalopathy, metabolic  CKD (chronic kidney disease) stage 4, GFR 15-29 ml/min  Tobacco abuse   Metabolic acidosis    #1 primary hyperparathyroidism with resultant hypercalcemia and toxic metabolic encephalopathy: Patient was admitted on February 7 with weakness and altered mental status found to have a calcium level of greater than 15. His history significant for primary hyperparathyroidism status post parathyroid adenoma resection in October of 2011, an admission in December of 2012 that showed persistent active parathyroid adenoma. Patient has been given bisphosphonate, calcitonin, aggressive IV fluids and Lasix this admission. His calcium level has dropped to 10.6, with this his mental status has also improved dramatically. There were concerns with his ability to followup outpatient with surgery given his schizophrenia, because of this inpatient surgical consultation was requested and performed on February 7. It was that surgeons recommendation to await Monday to when Dr. Hassell Done, surgeon who did patient's prior parathyroid adenoma resection, would be in the hospital for further recommendations. However, patient is medically stable for discharge today, I have been able to get in touch with the patient's brother [name] at phone number [phone] and he has agreed to make sure that patient has followup with surgery in a timely fashion. Patient will be discharged home today.  #2 urinary tract infection: Urine culture has only grown 25,000 colonies of gram-negative rods. He has received 3 days of IV Rocephin, this is sufficient treatment for an uncomplicated urinary tract infection and will not be discharged on any further antibiotics.  #3 chronic kidney disease stage IV: This is at baseline at time of discharge at around 2.7.  All rest of chronic conditions are stable and patient has been deemed ready for discharge home today.  Time spent on Discharge: Greater than 30 minutes.  SignedLelon Frohlich [facility] Pager: [phone] 01/07/2012, 11:27 AM

## 2012-01-07 NOTE — Progress Notes (Signed)
Please noted that pt's brother, Voyd Mehnert (whom pt lives with) can be reached at [phone].

## 2012-01-07 NOTE — Progress Notes (Signed)
Pt left with his brother at his side at this time. Pt's brother appeared to be actively listening to discharge instructions, but writer unable to tell if he will insure that pt will return for follow-up appointments or if an appointment will be made with Dr. Hassell Done at [place]. Discharge instructions/prescription given/explained to pt and his brother. Follow-up appointments noted. Pt appeared to be uninterested in discharge instructions nor follow-up. Pt without c/o, no acute s/s of distress noted upon discharge.

## 2012-02-09 ENCOUNTER — Encounter (INDEPENDENT_AMBULATORY_CARE_PROVIDER_SITE_OTHER): Payer: Self-pay | Admitting: Surgery

## 2012-02-09 ENCOUNTER — Ambulatory Visit (INDEPENDENT_AMBULATORY_CARE_PROVIDER_SITE_OTHER): Payer: Medicaid Other | Admitting: Surgery

## 2012-02-09 VITALS — BP 118/68 | HR 70 | Temp 97.8°F | Resp 20 | Ht 69.0 in | Wt 157.2 lb

## 2012-02-09 DIAGNOSIS — E210 Primary hyperparathyroidism: Secondary | ICD-10-CM

## 2012-02-09 NOTE — Progress Notes (Signed)
Chief Complaint:  perisistant hypercalcemia with right mediastinal adenoma  History of Present Illness:  [name] is an 66 y.o. male   had a right inferior parathyroid ectomy many months ago. He was told it he probably had a mediastinal adenoma the has continued to give him persistent hypercalcemia. We have advised him of this on several occasions but he is therefore presented repeat surgery. That may require a median sternotomy he is aware of that. We once again her schedule a surgery with thoracic standby to remove this mediastinal parathyroid adenoma.    Past Medical History  Diagnosis Date  . Schizophrenia     previously on zyprexa  . Primary hyperparathyroidism     s/p parathyroid adenoma resection Oct. 8, 2011, repeat scan showed residual right adenoma, however pt did not f/u with surgery as outpt  . Hypercalcemia     2/2 primary hyperparathyroidism  . History of nephrolithiasis 2008    s/p ureterolithotomy   . Pancreatitis 2011    history of several admissions for pancreatitis   . Alcoholism   . Chronic renal insufficiency     with history of acute on chronic secondary to dehydration and hypercalcemia  . Altered mental status 2011    hx of 2/2 hypercalcemia, EtOH w/d and medication overuse  . Hypertension   . Tobacco abuse   . Fx humeral neck 2008    right side, no history of surgical repair  . Encephalopathy   . Parathyroid adenoma     Past Surgical History  Procedure Date  . Parathyroid adenoma resection Oct. 8, 2011    residual adenoma Oct. 12, 2011  . Ankle pinning   . Open left proximal ureterolithotomy 09/18/2007  . Cystoscopy, laser cystolithoplaxy 08/16/2007  . Cysto, removal of bladder stone, attempted stent placement 08/15/2007    Current Outpatient Prescriptions  Medication Sig Dispense Refill  . alendronate (FOSAMAX) 70 MG tablet Take 1 tablet (70 mg total) by mouth every 7 (seven) days. Take with a full glass of water on an empty stomach.  30  tablet  2  . amLODipine (NORVASC) 10 MG tablet Take 1 tablet (10 mg total) by mouth daily.  30 tablet  3  . labetalol (NORMODYNE) 100 MG tablet Take 1 tablet (100 mg total) by mouth 2 (two) times daily.  60 tablet  1  . OLANZapine (ZYPREXA) 20 MG tablet Take 20 mg by mouth at bedtime.        . traZODone (DESYREL) 100 MG tablet Take 100 mg by mouth at bedtime.         Review of patient's allergies indicates no known allergies. History reviewed. No pertinent family history. Social History:   reports that he has been smoking Cigarettes.  He has a 45 pack-year smoking history. He has never used smokeless tobacco. He reports that he drinks alcohol. He reports that he uses illicit drugs.   REVIEW OF SYSTEMS - PERTINENT POSITIVES ONLY: Multi plex with multiple psych  issues  Physical Exam:   Blood pressure 118/68, pulse 70, temperature 97.8 F (36.6 C), temperature source Temporal, resp. rate 20, height 5\' 9"  (1.753 m), weight 157 lb 3.2 oz (71.305 kg). Body mass index is 23.21 kg/(m^2).  Gen:  WDWN AA male NAD  Neurological: Alert and oriented to person, place, and time. Motor and sensory function is grossly intact  Head: Normocephalic and atraumatic.  Eyes: Conjunctivae are normal. Pupils are equal, round, and reactive to light. No scleral icterus.  Neck: Normal range  of motion. Neck supple. No tracheal deviation or thyromegaly present.  Cardiovascular:  SR without murmurs or gallops.  No carotid bruits Respiratory: Effort normal.  No respiratory distress. No chest wall tenderness. Breath sounds normal.  No wheezes, rales or rhonchi.  Abdomen:  nontender GU: Musculoskeletal: Normal range of motion. Extremities are nontender. No cyanosis, edema or clubbing noted Lymphadenopathy: No cervical, preauricular, postauricular or axillary adenopathy is present Skin: Skin is warm and dry. No rash noted. No diaphoresis. No erythema. No pallor. Pscyh: Normal mood and affect. Behavior is normal. Judgment  and thought content normal.   LABORATORY RESULTS: No results found for this or any previous visit (from the past 48 hour(s)).  RADIOLOGY RESULTS: No results found.  Problem List: Patient Active Problem List  Diagnoses  . Primary hyperparathyroidism  . Hypercalcemia  . Pancreatitis, hx of   . Schizophrenia  . History of nephrolithiasis  . Nephrogenic diabetes insipidus  . Hypertension  . Alcohol abuse  . Altered mental status  . UTI (lower urinary tract infection)  . Acute on chronic renal insufficiency  . Parathyroid adenoma  . Parathyroid related hypercalcemia  . Encephalopathy, metabolic  . CKD (chronic kidney disease) stage 4, GFR 15-29 ml/min  . Tobacco abuse  . Metabolic acidosis    Assessment & Plan: persistant hypercalcemia with mediastinal parathyroid adenoma Get CT backup to retrieve mediastinal adenoma    Matt B. Hassell Done, MD, [facility] [phone] beeper [phone]  02/09/2012 10:23 AM

## 2012-02-15 ENCOUNTER — Telehealth (INDEPENDENT_AMBULATORY_CARE_PROVIDER_SITE_OTHER): Payer: Self-pay | Admitting: Surgery

## 2012-03-08 ENCOUNTER — Telehealth (INDEPENDENT_AMBULATORY_CARE_PROVIDER_SITE_OTHER): Payer: Self-pay | Admitting: General Surgery

## 2012-03-08 ENCOUNTER — Encounter (HOSPITAL_COMMUNITY): Payer: Self-pay | Admitting: *Deleted

## 2012-03-08 NOTE — Telephone Encounter (Signed)
Brother calling to see if surgery is still set for tomorrow.  If not, when will it be done?  Please call him:  [phone]

## 2012-03-08 NOTE — Progress Notes (Addendum)
I spoke with patient regarding hx and information for surgery.  Pt states that he has left sided chest pain that radiates down left ar> States that pain is mild.Denies any shortness of breath. Pt states that he has had the pain for a few days.  I instructed paientt that if he is having chest pain that he should call 911.  Pt said " it's not that bad, I 'll tell the Dr. Lianne Cure it.  Pt did not know if he has a cardiologist or not.  Pts brother was not aware of pt having a cardiologist or having any cardiac studies done.  States his PCP is Dr. Leona Carry.  "Used to go to [facility].  I asked pts brother about pts chest pain- "aw, I don't thin it is anything."

## 2012-03-08 NOTE — Telephone Encounter (Signed)
Contacted the patients brother as asked no answer, left a voicemail that the surgery is still on for tomorrow. Patients brother called back and I spoke with him and advised him the surgery is still on for tomorrow and the patient needs to be NPO after midnight and follow any other instructions that were given to them by the hospital.

## 2012-03-09 ENCOUNTER — Ambulatory Visit (HOSPITAL_COMMUNITY): Payer: Medicaid Other

## 2012-03-09 ENCOUNTER — Encounter (HOSPITAL_COMMUNITY): Payer: Self-pay | Admitting: Certified Registered"

## 2012-03-09 ENCOUNTER — Encounter (HOSPITAL_COMMUNITY)
Admission: RE | Disposition: A | Discharge status: Home / Self Care | Payer: Self-pay | Source: Ambulatory Visit | Attending: Surgery

## 2012-03-09 ENCOUNTER — Inpatient Hospital Stay: Admit: 2012-03-09 | Payer: Self-pay | Admitting: Surgery

## 2012-03-09 ENCOUNTER — Encounter (HOSPITAL_COMMUNITY): Payer: Self-pay | Admitting: *Deleted

## 2012-03-09 ENCOUNTER — Inpatient Hospital Stay (HOSPITAL_COMMUNITY)
Admission: RE | Admit: 2012-03-09 | Discharge: 2012-03-10 | DRG: 626 | Disposition: A | Discharge status: Home / Self Care | Payer: Medicaid Other | Source: Ambulatory Visit | Attending: Surgery | Admitting: Surgery

## 2012-03-09 ENCOUNTER — Ambulatory Visit (HOSPITAL_COMMUNITY): Payer: Medicaid Other | Admitting: Certified Registered"

## 2012-03-09 ENCOUNTER — Encounter (HOSPITAL_COMMUNITY): Payer: Self-pay | Admitting: General Practice

## 2012-03-09 ENCOUNTER — Other Ambulatory Visit (INDEPENDENT_AMBULATORY_CARE_PROVIDER_SITE_OTHER): Payer: Self-pay | Admitting: Surgery

## 2012-03-09 DIAGNOSIS — F17200 Nicotine dependence, unspecified, uncomplicated: Secondary | ICD-10-CM | POA: Diagnosis present

## 2012-03-09 DIAGNOSIS — M47812 Spondylosis without myelopathy or radiculopathy, cervical region: Secondary | ICD-10-CM | POA: Diagnosis present

## 2012-03-09 DIAGNOSIS — F209 Schizophrenia, unspecified: Secondary | ICD-10-CM | POA: Diagnosis present

## 2012-03-09 DIAGNOSIS — D351 Benign neoplasm of parathyroid gland: Secondary | ICD-10-CM

## 2012-03-09 DIAGNOSIS — Z23 Encounter for immunization: Secondary | ICD-10-CM

## 2012-03-09 DIAGNOSIS — Z79899 Other long term (current) drug therapy: Secondary | ICD-10-CM

## 2012-03-09 DIAGNOSIS — I129 Hypertensive chronic kidney disease with stage 1 through stage 4 chronic kidney disease, or unspecified chronic kidney disease: Secondary | ICD-10-CM | POA: Diagnosis present

## 2012-03-09 DIAGNOSIS — Z87442 Personal history of urinary calculi: Secondary | ICD-10-CM

## 2012-03-09 DIAGNOSIS — N184 Chronic kidney disease, stage 4 (severe): Secondary | ICD-10-CM | POA: Diagnosis present

## 2012-03-09 DIAGNOSIS — J42 Unspecified chronic bronchitis: Secondary | ICD-10-CM | POA: Diagnosis present

## 2012-03-09 DIAGNOSIS — E210 Primary hyperparathyroidism: Secondary | ICD-10-CM

## 2012-03-09 DIAGNOSIS — F1010 Alcohol abuse, uncomplicated: Secondary | ICD-10-CM | POA: Diagnosis present

## 2012-03-09 HISTORY — PX: PARATHYROIDECTOMY: SHX19

## 2012-03-09 HISTORY — PX: THYROIDECTOMY: SHX17

## 2012-03-09 HISTORY — DX: Angina pectoris, unspecified: I20.9

## 2012-03-09 LAB — DIFFERENTIAL
Basophils Relative: 0 % (ref 0–1)
Eosinophils Absolute: 0.2 10*3/uL (ref 0.0–0.7)
Eosinophils Relative: 2 % (ref 0–5)
Neutrophils Relative %: 68 % (ref 43–77)

## 2012-03-09 LAB — COMPREHENSIVE METABOLIC PANEL
AST: 11 U/L (ref 0–37)
Albumin: 3.5 g/dL (ref 3.5–5.2)
Alkaline Phosphatase: 218 U/L — ABNORMAL HIGH (ref 39–117)
Chloride: 108 mEq/L (ref 96–112)
Potassium: 4.6 mEq/L (ref 3.5–5.1)
Total Bilirubin: 0.1 mg/dL — ABNORMAL LOW (ref 0.3–1.2)

## 2012-03-09 LAB — CBC
HCT: 34.4 % — ABNORMAL LOW (ref 39.0–52.0)
MCH: 27.7 pg (ref 26.0–34.0)
MCHC: 32.1 g/dL (ref 30.0–36.0)
MCHC: 32.6 g/dL (ref 30.0–36.0)
MCV: 86.2 fL (ref 78.0–100.0)
Platelets: 424 10*3/uL — ABNORMAL HIGH (ref 150–400)
Platelets: 428 10*3/uL — ABNORMAL HIGH (ref 150–400)
RDW: 14.9 % (ref 11.5–15.5)
WBC: 9.4 10*3/uL (ref 4.0–10.5)

## 2012-03-09 LAB — SURGICAL PCR SCREEN
MRSA, PCR: NEGATIVE
Staphylococcus aureus: NEGATIVE

## 2012-03-09 LAB — CREATININE, SERUM: GFR calc Af Amer: 22 mL/min — ABNORMAL LOW (ref >90)

## 2012-03-09 SURGERY — PARATHYROIDECTOMY
Anesthesia: General | Site: Neck | Wound class: Clean

## 2012-03-09 SURGERY — PARATHYROIDECTOMY
Anesthesia: General | Site: Neck

## 2012-03-09 MED ORDER — TRAZODONE HCL 100 MG PO TABS
100.0000 mg | ORAL_TABLET | Freq: Every day | ORAL | Status: DC
  Administered 2012-03-09: 100 mg via ORAL
  Filled 2012-03-09 (×2): qty 1

## 2012-03-09 MED ORDER — HEPARIN SODIUM (PORCINE) 5000 UNIT/ML IJ SOLN
5000.0000 [IU] | Freq: Once | INTRAMUSCULAR | Status: AC
  Administered 2012-03-09: 5000 [IU] via SUBCUTANEOUS
  Filled 2012-03-09: qty 1

## 2012-03-09 MED ORDER — CEFAZOLIN SODIUM 1-5 GM-% IV SOLN
INTRAVENOUS | Status: AC
  Filled 2012-03-09: qty 50

## 2012-03-09 MED ORDER — ALBUMIN HUMAN 5 % IV SOLN
INTRAVENOUS | Status: DC | PRN
  Administered 2012-03-09: 10:00:00 via INTRAVENOUS

## 2012-03-09 MED ORDER — LIDOCAINE HCL 4 % MT SOLN
OROMUCOSAL | Status: DC | PRN
  Administered 2012-03-09: 4 mL via TOPICAL

## 2012-03-09 MED ORDER — MORPHINE SULFATE 2 MG/ML IJ SOLN: 1.0000 mg | INTRAMUSCULAR | Status: DC | PRN

## 2012-03-09 MED ORDER — DEXTROSE 5 % IV SOLN
INTRAVENOUS | Status: DC | PRN
  Administered 2012-03-09: 10:00:00 via INTRAVENOUS

## 2012-03-09 MED ORDER — HEPARIN SODIUM (PORCINE) 5000 UNIT/ML IJ SOLN
5000.0000 [IU] | Freq: Three times a day (TID) | INTRAMUSCULAR | Status: DC
  Administered 2012-03-09 – 2012-03-10 (×3): 5000 [IU] via SUBCUTANEOUS
  Filled 2012-03-09 (×7): qty 1

## 2012-03-09 MED ORDER — KCL IN DEXTROSE-NACL 20-5-0.45 MEQ/L-%-% IV SOLN
INTRAVENOUS | Status: DC
  Administered 2012-03-10: 06:00:00 via INTRAVENOUS
  Filled 2012-03-09 (×3): qty 1000

## 2012-03-09 MED ORDER — SODIUM CHLORIDE 0.9 % IV SOLN
INTRAVENOUS | Status: DC | PRN
  Administered 2012-03-09 (×2): via INTRAVENOUS

## 2012-03-09 MED ORDER — HEMOSTATIC AGENTS (NO CHARGE) OPTIME
TOPICAL | Status: DC | PRN
  Administered 2012-03-09: 1 via TOPICAL

## 2012-03-09 MED ORDER — ROCURONIUM BROMIDE 100 MG/10ML IV SOLN
INTRAVENOUS | Status: DC | PRN
  Administered 2012-03-09: 20 mg via INTRAVENOUS

## 2012-03-09 MED ORDER — 0.9 % SODIUM CHLORIDE (POUR BTL) OPTIME
TOPICAL | Status: DC | PRN
  Administered 2012-03-09: 1000 mL

## 2012-03-09 MED ORDER — ONDANSETRON HCL 4 MG/2ML IJ SOLN
INTRAMUSCULAR | Status: DC | PRN
  Administered 2012-03-09: 4 mg via INTRAVENOUS

## 2012-03-09 MED ORDER — LACTATED RINGERS IV SOLN
INTRAVENOUS | Status: DC
  Administered 2012-03-09: 09:00:00 via INTRAVENOUS

## 2012-03-09 MED ORDER — CEFAZOLIN SODIUM 1-5 GM-% IV SOLN
1.0000 g | INTRAVENOUS | Status: AC
  Administered 2012-03-09: 1 g via INTRAVENOUS

## 2012-03-09 MED ORDER — LACTATED RINGERS IV SOLN
INTRAVENOUS | Status: DC | PRN
  Administered 2012-03-09: 09:00:00 via INTRAVENOUS

## 2012-03-09 MED ORDER — FENTANYL CITRATE 0.05 MG/ML IJ SOLN
INTRAMUSCULAR | Status: DC | PRN
  Administered 2012-03-09: 50 ug via INTRAVENOUS
  Administered 2012-03-09: 150 ug via INTRAVENOUS

## 2012-03-09 MED ORDER — PROPOFOL 10 MG/ML IV EMUL
INTRAVENOUS | Status: DC | PRN
  Administered 2012-03-09: 170 mg via INTRAVENOUS

## 2012-03-09 MED ORDER — HYDROCODONE-ACETAMINOPHEN 5-325 MG PO TABS
1.0000 | ORAL_TABLET | ORAL | Status: DC | PRN
Start: 2012-03-09 — End: 2012-03-10

## 2012-03-09 MED ORDER — HYDROMORPHONE HCL PF 1 MG/ML IJ SOLN: 0.2500 mg | INTRAMUSCULAR | Status: DC | PRN

## 2012-03-09 MED ORDER — MUPIROCIN 2 % EX OINT
TOPICAL_OINTMENT | Freq: Two times a day (BID) | CUTANEOUS | Status: DC
  Filled 2012-03-09 (×2): qty 22

## 2012-03-09 MED ORDER — ONDANSETRON HCL 4 MG/2ML IJ SOLN: 4.0000 mg | Freq: Once | INTRAMUSCULAR | Status: DC | PRN

## 2012-03-09 MED ORDER — HYDRALAZINE HCL 20 MG/ML IJ SOLN
INTRAMUSCULAR | Status: DC | PRN
  Administered 2012-03-09 (×3): 2 mg via INTRAVENOUS

## 2012-03-09 SURGICAL SUPPLY — 49 items
BLADE SURG 10 STRL SS (BLADE) ×2 IMPLANT
BLADE SURG 15 STRL LF DISP TIS (BLADE) ×1 IMPLANT
BLADE SURG 15 STRL SS (BLADE) ×1
CANISTER SUCTION 2500CC (MISCELLANEOUS) ×2 IMPLANT
CHLORAPREP W/TINT 10.5 ML (MISCELLANEOUS) ×4 IMPLANT
CLIP TI MEDIUM 24 (CLIP) ×2 IMPLANT
CLIP TI MEDIUM 6 (CLIP) ×2 IMPLANT
CLIP TI WIDE RED SMALL 24 (CLIP) ×2 IMPLANT
CLIP TI WIDE RED SMALL 6 (CLIP) ×2 IMPLANT
CLOTH BEACON ORANGE TIMEOUT ST (SAFETY) ×2 IMPLANT
CONT SPEC 4OZ CLIKSEAL STRL BL (MISCELLANEOUS) ×2 IMPLANT
COVER SURGICAL LIGHT HANDLE (MISCELLANEOUS) ×2 IMPLANT
DERMABOND ADVANCED (GAUZE/BANDAGES/DRESSINGS) ×1
DERMABOND ADVANCED .7 DNX12 (GAUZE/BANDAGES/DRESSINGS) ×1 IMPLANT
DRAPE PED LAPAROTOMY (DRAPES) ×2 IMPLANT
DRAPE UTILITY 15X26 W/TAPE STR (DRAPE) ×4 IMPLANT
ELECT CAUTERY BLADE 6.4 (BLADE) ×2 IMPLANT
ELECT REM PT RETURN 9FT ADLT (ELECTROSURGICAL) ×2
ELECTRODE REM PT RTRN 9FT ADLT (ELECTROSURGICAL) ×1 IMPLANT
GAUZE SPONGE 2X2 8PLY STRL LF (GAUZE/BANDAGES/DRESSINGS) ×1 IMPLANT
GAUZE SPONGE 4X4 16PLY XRAY LF (GAUZE/BANDAGES/DRESSINGS) ×2 IMPLANT
GLOVE BIO SURGEON STRL SZ 6 (GLOVE) ×4 IMPLANT
GLOVE BIO SURGEON STRL SZ8 (GLOVE) ×2 IMPLANT
GLOVE SURG ORTHO 8.0 STRL STRW (GLOVE) ×2 IMPLANT
GOWN PREVENTION PLUS XLARGE (GOWN DISPOSABLE) ×2 IMPLANT
GOWN PREVENTION PLUS XXLARGE (GOWN DISPOSABLE) ×2 IMPLANT
GOWN STRL NON-REIN LRG LVL3 (GOWN DISPOSABLE) ×4 IMPLANT
HEMOSTAT SURGICEL 2X4 FIBR (HEMOSTASIS) ×2 IMPLANT
KIT BASIN OR (CUSTOM PROCEDURE TRAY) ×2 IMPLANT
KIT ROOM TURNOVER OR (KITS) ×2 IMPLANT
NEEDLE HYPO 25GX1X1/2 BEV (NEEDLE) IMPLANT
NS IRRIG 1000ML POUR BTL (IV SOLUTION) ×2 IMPLANT
PACK SURGICAL SETUP 50X90 (CUSTOM PROCEDURE TRAY) ×2 IMPLANT
PAD ARMBOARD 7.5X6 YLW CONV (MISCELLANEOUS) ×4 IMPLANT
PENCIL BUTTON HOLSTER BLD 10FT (ELECTRODE) ×2 IMPLANT
SPONGE GAUZE 2X2 STER 10/PKG (GAUZE/BANDAGES/DRESSINGS) ×1
SPONGE GAUZE 4X4 12PLY (GAUZE/BANDAGES/DRESSINGS) ×2 IMPLANT
SPONGE INTESTINAL PEANUT (DISPOSABLE) ×2 IMPLANT
SUT MNCRL AB 4-0 PS2 18 (SUTURE) ×2 IMPLANT
SUT SILK 2 0 (SUTURE)
SUT SILK 2-0 18XBRD TIE 12 (SUTURE) IMPLANT
SUT SILK 3 0 (SUTURE)
SUT SILK 3-0 18XBRD TIE 12 (SUTURE) IMPLANT
SUT VIC AB 3-0 SH 18 (SUTURE) ×4 IMPLANT
SYR CONTROL 10ML LL (SYRINGE) ×2 IMPLANT
TAPE CLOTH SURG 4X10 WHT LF (GAUZE/BANDAGES/DRESSINGS) ×2 IMPLANT
TOWEL OR 17X24 6PK STRL BLUE (TOWEL DISPOSABLE) ×2 IMPLANT
TOWEL OR 17X26 10 PK STRL BLUE (TOWEL DISPOSABLE) ×2 IMPLANT
TUBE CONNECTING 12X1/4 (SUCTIONS) ×2 IMPLANT

## 2012-03-09 NOTE — Preoperative (Signed)
Beta Blockers   Reason not to administer Beta Blockers:Hold beta blocker due to other, to evaluate need for intraop, Dr. Linna Caprice instructions to HA RN

## 2012-03-09 NOTE — Transfer of Care (Signed)
Immediate Anesthesia Transfer of Care Note  Patient: [name]  Procedure(s) Performed: Procedure(s) (LRB): PARATHYROIDECTOMY (N/A)  Patient Location: PACU  Anesthesia Type: General  Level of Consciousness: awake, confused and responds to stimulation  Airway & Oxygen Therapy: Patient Spontanous Breathing and Patient connected to nasal cannula oxygen  Post-op Assessment: Report given to PACU RN, Post -op Vital signs reviewed and stable and Patient moving all extremities  Post vital signs: Reviewed and stable  Complications: No apparent anesthesia complications

## 2012-03-09 NOTE — H&P (Signed)
Chief Complaint: perisistant hypercalcemia with right mediastinal adenoma   History of Present Illness: [name] is an 66 y.o. male had a right inferior parathyroidectomy many months ago. He was told it he probably had a mediastinal adenoma the has continued to give him persistent hypercalcemia. We have advised him of this on several occasions but he has failed to show up for scheduled surgery.  Once again he is here for repeat  surgery that may require a median sternotomy he is aware of that. We once again her schedule a surgery with thoracic standby to remove this mediastinal parathyroid adenoma.  Past Medical History   Diagnosis  Date   .  Schizophrenia      previously on zyprexa   .  Primary hyperparathyroidism      s/p parathyroid adenoma resection Oct. 8, 2011, repeat scan showed residual right adenoma, however pt did not f/u with surgery as outpt   .  Hypercalcemia      2/2 primary hyperparathyroidism   .  History of nephrolithiasis  2008     s/p ureterolithotomy   .  Pancreatitis  2011     history of several admissions for pancreatitis   .  Alcoholism    .  Chronic renal insufficiency      with history of acute on chronic secondary to dehydration and hypercalcemia   .  Altered mental status  2011     hx of 2/2 hypercalcemia, EtOH w/d and medication overuse   .  Hypertension    .  Tobacco abuse    .  Fx humeral neck  2008     right side, no history of surgical repair   .  Encephalopathy    .  Parathyroid adenoma     Past Surgical History   Procedure  Date   .  Parathyroid adenoma resection  Oct. 8, 2011     residual adenoma Oct. 12, 2011   .  Ankle pinning    .  Open left proximal ureterolithotomy  09/18/2007   .  Cystoscopy, laser cystolithoplaxy  08/16/2007   .  Cysto, removal of bladder stone, attempted stent placement  08/15/2007    Current Outpatient Prescriptions   Medication  Sig  Dispense  Refill   .  alendronate (FOSAMAX) 70 MG tablet  Take 1 tablet (70 mg  total) by mouth every 7 (seven) days. Take with a full glass of water on an empty stomach.  30 tablet  2   .  amLODipine (NORVASC) 10 MG tablet  Take 1 tablet (10 mg total) by mouth daily.  30 tablet  3   .  labetalol (NORMODYNE) 100 MG tablet  Take 1 tablet (100 mg total) by mouth 2 (two) times daily.  60 tablet  1   .  OLANZapine (ZYPREXA) 20 MG tablet  Take 20 mg by mouth at bedtime.     .  traZODone (DESYREL) 100 MG tablet  Take 100 mg by mouth at bedtime.      Review of patient's allergies indicates no known allergies.  History reviewed. No pertinent family history.  Social History: reports that he has been smoking Cigarettes. He has a 45 pack-year smoking history. He has never used smokeless tobacco. He reports that he drinks alcohol. He reports that he uses illicit drugs.  REVIEW OF SYSTEMS - PERTINENT POSITIVES ONLY:  Multi plex with multiple psych issues  Physical Exam:  Blood pressure 118/68, pulse 70, temperature 97.8 F (36.6 C), temperature source  Temporal, resp. rate 20, height 5\' 9"  (1.753 m), weight 157 lb 3.2 oz (71.305 kg).  Body mass index is 23.21 kg/(m^2).  Gen: WDWN AA male NAD  Neurological: . Motor and sensory function is grossly intact.  Patient appears to understand why he is here Head: Normocephalic and atraumatic.  Eyes: Conjunctivae are normal. Pupils are equal, round, and reactive to light. No scleral icterus.  Neck: Normal range of motion. Neck supple. No tracheal deviation or thyromegaly present. Healed incision over the right sternocleidomastoid Cardiovascular: SR without murmurs or gallops. No carotid bruits  Respiratory: Effort normal. No respiratory distress. No chest wall tenderness. Breath sounds normal. No wheezes, rales or rhonchi.  Abdomen: nontender  GU:  Musculoskeletal: Normal range of motion. Extremities are nontender. No cyanosis, edema or clubbing noted Lymphadenopathy: No cervical, preauricular, postauricular or axillary adenopathy is  present Skin: Skin is warm and dry. No rash noted. No diaphoresis. No erythema. No pallor. Pscyh: history of schizophrenia.  LABORATORY RESULTS:  No results found for this or any previous visit (from the past 48 hour(s)).  RADIOLOGY RESULTS: Persistent inferior right parathyroid adenoma located in the right  paratracheal region. By nuc med.   CT chest:  Findings: In the right paratracheal region, immediately inferior to  the right lobe of the thyroid gland is a lesion which wraps around  the trachea measuring 2.5 x 0.8 cm in transverse dimension spanning  2.4 cm length. This corresponds to the location of the increased  radiotracer uptake on the recent Sestamibi study and is most  suggestive of parathyroid adenoma. Postsurgical changes noted at  the level of the lower neck/thyroid.   No results found.  Problem List:  Patient Active Problem List   Diagnoses   .  Primary hyperparathyroidism   .  Hypercalcemia   .  Pancreatitis, hx of   .  Schizophrenia   .  History of nephrolithiasis   .  Nephrogenic diabetes insipidus   .  Hypertension   .  Alcohol abuse   .  Altered mental status   .  UTI (lower urinary tract infection)   .  Acute on chronic renal insufficiency   .  Parathyroid adenoma   .  Parathyroid related hypercalcemia   .  Encephalopathy, metabolic   .  CKD (chronic kidney disease) stage 4, GFR 15-29 ml/min   .  Tobacco abuse   .  Metabolic acidosis    Assessment & Plan:  persistant hypercalcemia with mediastinal parathyroid adenoma  Get CT backup to retrieve mediastinal adenoma  Matt B. Hassell Done, MD, [facility]  [phone] beeper  [phone]

## 2012-03-09 NOTE — Progress Notes (Signed)
Received from PACU post thyroidectomy, incision intact with dermabond , arousable, VSS, closely monitored

## 2012-03-09 NOTE — Significant Event (Signed)
CRITICAL VALUE ALERT  Critical value received:  Ionized Calcium 1.81  Date of notification:  03/09/2012   Time of notification:  10:21 PM    Critical value read back:yes  Nurse who received alert:  Mike Craze   MD notified (1st page):  Donne Hazel, MD  Time of first page:  10:21 PM   MD notified (2nd page):  Time of second page:  Responding MD:  Donne Hazel, MD  Time MD responded:  10:54 PM

## 2012-03-09 NOTE — Anesthesia Preprocedure Evaluation (Signed)
Anesthesia Evaluation  Patient identified by MRN, date of birth, ID band Patient awake and Patient confused    Reviewed: Allergy & Precautions, H&P , NPO status , Patient's Chart, lab work & pertinent test results  Airway Mallampati: I TM Distance: >3 FB Neck ROM: full    Dental  (+) Edentulous Upper and Edentulous Lower   Pulmonary COPDCurrent Smoker,          Cardiovascular hypertension, + angina + CAD Rhythm:regular Rate:Normal     Neuro/Psych PSYCHIATRIC DISORDERS    GI/Hepatic (+)     substance abuse   ,   Endo/Other    Renal/GU CRF     Musculoskeletal   Abdominal   Peds  Hematology   Anesthesia Other Findings   Reproductive/Obstetrics                           Anesthesia Physical Anesthesia Plan  ASA: III  Anesthesia Plan: General   Post-op Pain Management:    Induction: Intravenous  Airway Management Planned: Oral ETT  Additional Equipment:   Intra-op Plan:   Post-operative Plan:   Informed Consent: I have reviewed the patients History and Physical, chart, labs and discussed the procedure including the risks, benefits and alternatives for the proposed anesthesia with the patient or authorized representative who has indicated his/her understanding and acceptance.     Plan Discussed with: CRNA, Anesthesiologist and Surgeon  Anesthesia Plan Comments:         Anesthesia Quick Evaluation

## 2012-03-09 NOTE — Progress Notes (Addendum)
Patient stated he had $20 dollars in his coat pocket. Offered to lock money in security, patient refused requesting it stay in his bag and proceed with him to holding. Informed that hospital is not responsible for lost belongings.   Patient cannot recall the names of his medications that were taken, but stated he did not take anything today and "eerything yesterday". Spoke with Inez Catalina from [employer] and she attempted to contact pharmacy that patient uses, they were closed. Spoke with Dr. Linna Caprice regarding patient not taking beta blocker this morning. He stated they would give IV beta blocker instead of PO during surgery.

## 2012-03-09 NOTE — Anesthesia Procedure Notes (Signed)
Procedure Name: Intubation Date/Time: 03/09/2012 9:27 AM Performed by: Terrill Mohr Pre-anesthesia Checklist: Patient identified, Emergency Drugs available, Suction available and Patient being monitored Patient Re-evaluated:Patient Re-evaluated prior to inductionOxygen Delivery Method: Circle system utilized Preoxygenation: Pre-oxygenation with 100% oxygen Intubation Type: IV induction Ventilation: Mask ventilation without difficulty Laryngoscope Size: Mac and 4 Grade View: Grade I Tube type: Oral Tube size: 7.5 mm Number of attempts: 1 Airway Equipment and Method: Stylet Placement Confirmation: ETT inserted through vocal cords under direct vision,  breath sounds checked- equal and bilateral and positive ETCO2 Secured at: 22 (cm at teeth) cm Tube secured with: Tape Dental Injury: Teeth and Oropharynx as per pre-operative assessment

## 2012-03-09 NOTE — Progress Notes (Signed)
Patient with belongings and had $119.00, which was given to brother Valdez Shotts. Informed family that hospital is not responsible for lost belongings

## 2012-03-09 NOTE — Anesthesia Postprocedure Evaluation (Signed)
  Anesthesia Post-op Note  Patient: [name]  Procedure(s) Performed: Procedure(s) (LRB): PARATHYROIDECTOMY (N/A)  Patient Location: PACU  Anesthesia Type: General  Level of Consciousness: awake  Airway and Oxygen Therapy: Patient Spontanous Breathing  Post-op Pain: mild  Post-op Assessment: Post-op Vital signs reviewed  Post-op Vital Signs: stable  Complications: No apparent anesthesia complications

## 2012-03-09 NOTE — Op Note (Signed)
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  The neck was prepped with chlorhexidine and draped sterilely. Limited hyperextension of the neck because of some cervical spondylosis issues. Right transverse incision was made and the platysma was divided as were the strap muscles. The right thyroid was mobilized and I wasn't able to get into a plane anterior that was free. Went down and looking at the along the trachea and the mediastinum and found some fat pads. Laterally there was a palpable area that we were able to expose and by pulling it back on the chin began exposing what layer was a 4 cm about 2 and half centimeter parathyroid adenoma that weighed 8.8 g. This was teased from the surrounding tissue and its vascular supply was clipped with medium clips. It was removed in toto and sent for frozen section which confirmed the diagnosis. Some Surgicel fluff was placed down in the bed where the parathyroid had been removed. Hemostasis was present. The strap muscles were approximated with horizontal mattress sutures of 4-0 Vicryl as were the platysma muscles. Wound was closed in running 4-0 monocril  with Dermabond. Patient was taken recovery room in satisfactory condition.  Matt B. Hassell Done, [place], [facility], [place]

## 2012-03-10 LAB — COMPREHENSIVE METABOLIC PANEL
ALT: 11 U/L (ref 0–53)
Alkaline Phosphatase: 198 U/L — ABNORMAL HIGH (ref 39–117)
BUN: 38 mg/dL — ABNORMAL HIGH (ref 6–23)
CO2: 16 mEq/L — ABNORMAL LOW (ref 19–32)
Chloride: 106 mEq/L (ref 96–112)
GFR calc Af Amer: 20 mL/min — ABNORMAL LOW (ref >90)
GFR calc non Af Amer: 17 mL/min — ABNORMAL LOW (ref >90)
Glucose, Bld: 111 mg/dL — ABNORMAL HIGH (ref 70–99)
Potassium: 4.1 mEq/L (ref 3.5–5.1)
Sodium: 134 mEq/L — ABNORMAL LOW (ref 135–145)
Total Bilirubin: 0.2 mg/dL — ABNORMAL LOW (ref 0.3–1.2)
Total Protein: 6.5 g/dL (ref 6.0–8.3)

## 2012-03-10 LAB — CBC
HCT: 27.6 % — ABNORMAL LOW (ref 39.0–52.0)
MCHC: 32.6 g/dL (ref 30.0–36.0)
Platelets: 370 10*3/uL (ref 150–400)
RDW: 14.9 % (ref 11.5–15.5)
WBC: 9.8 10*3/uL (ref 4.0–10.5)

## 2012-03-10 MED ORDER — HYDROCODONE-ACETAMINOPHEN 5-325 MG PO TABS: 1.0000 | ORAL_TABLET | ORAL | Status: DC | PRN

## 2012-03-10 MED ORDER — CALCIUM CARBONATE ANTACID 500 MG PO CHEW: 1.0000 | CHEWABLE_TABLET | Freq: Three times a day (TID) | ORAL | Status: DC

## 2012-03-10 MED ORDER — LABETALOL HCL 100 MG PO TABS: 100.0000 mg | ORAL_TABLET | Freq: Two times a day (BID) | ORAL | Status: DC

## 2012-03-10 MED ORDER — AMLODIPINE BESYLATE 10 MG PO TABS: 10.0000 mg | ORAL_TABLET | Freq: Every day | ORAL | Status: DC

## 2012-03-10 NOTE — Significant Event (Signed)
CRITICAL VALUE ALERT  Critical value received:  Ionized Calcium 1.69 (Value less than previously reported value)  Date of notification:  03/10/2012   Time of notification:  1:14 AM   Critical value read back:yes  Nurse who received alert:  Mike Craze   MD notified (1st page):    Time of first page:    MD notified (2nd page):  Time of second page:  Responding MD:    Time MD responded:

## 2012-03-10 NOTE — Progress Notes (Signed)
1 Day Post-Op  Subjective: Feels good and able to go home minimal pain no nausea  Objective: Vital signs in last 24 hours: Temp:  [97.5 F (36.4 C)-98.8 F (37.1 C)] 98.4 F (36.9 C) (04/13 1005) Pulse Rate:  [68-77] 69  (04/13 1005) Resp:  [20-28] 22  (04/13 1005) BP: (133-158)/(70-87) 133/75 mmHg (04/13 1005) SpO2:  [97 %-100 %] 100 % (04/13 1005)   Intake/Output from previous day: 04/12 0701 - 04/13 0700 In: 2753.3 [P.O.:480; I.V.:2023.3; IV Piggyback:250] Out: 1285 [Urine:1275; Blood:10] Intake/Output this shift: Total I/O In: 360 [P.O.:360] Out: 900 [Urine:900]   General appearance: alert, cooperative and no distress Resp: clear to auscultation bilaterally  Incision: healing well  Lab Results:   Basename 03/10/12 0640 03/09/12 1321  WBC 9.8 9.4  HGB 9.0* 11.2*  HCT 27.6* 34.4*  PLT 370 428*   BMET  Basename 03/10/12 0640 03/09/12 1321 03/09/12 0734  NA 134* -- 140  K 4.1 -- 4.6  CL 106 -- 108  CO2 16* -- 20  GLUCOSE 111* -- 91  BUN 38* -- 36*  CREATININE 3.49* 3.20* --  CALCIUM 10.5 -- 13.6*   PT/INR No results found for this basename: LABPROT:2,INR:2 in the last 72 hours ABG No results found for this basename: PHART:2,PCO2:2,PO2:2,HCO3:2 in the last 72 hours  MEDS, Scheduled    . heparin  5,000 Units Subcutaneous Q8H  . traZODone  100 mg Oral QHS  . DISCONTD: mupirocin ointment   Nasal BID    Studies/Results: Dg Chest 2 View  03/09/2012  *RADIOLOGY REPORT*  Clinical Data: Preop parathyroidectomy.  History of chest pain, smoker.  CHEST - 2 VIEW  Comparison: None  Findings: Mild hyperinflation of the lungs and peribronchial thickening, likely chronic bronchitis.  Heart is normal size. Lungs are clear.  No effusions or acute bony abnormality.  IMPRESSION: Mild hyperinflation, chronic bronchitis.  Original Report Authenticated By: Raelyn Number, M.D.    Assessment: s/p Procedure(s): PARATHYROIDECTOMY Ca++ 10.5  Plan: Discharge Shopuld be  able to go on usual home meds and be seen in office in 7-10 days   LOS: 1 day     Haywood Lasso, MD, [facility], [place]   03/10/2012 12:45 PM

## 2012-03-10 NOTE — Progress Notes (Signed)
Brother is present during discharge instructions. Prescriptions given and others were faxed to patients pharmacy. Iv was dc'd. Home medications were gone over, dc instructions gone over including diet, calcium replacement, follow up appointment to be made. Signs and symptoms of infection gone over and when to call the doctor. Patient told not to use antibiotic cream on incision site.   Patient and brother verbalized understanding of instructions.

## 2012-03-12 ENCOUNTER — Encounter (HOSPITAL_COMMUNITY): Payer: Self-pay | Admitting: Surgery

## 2012-03-12 NOTE — Progress Notes (Signed)
UR of chart complete.  

## 2012-03-14 ENCOUNTER — Encounter (HOSPITAL_COMMUNITY): Payer: Self-pay | Admitting: Emergency Medicine

## 2012-03-14 ENCOUNTER — Emergency Department (HOSPITAL_COMMUNITY)
Admission: EM | Admit: 2012-03-14 | Discharge: 2012-03-15 | Disposition: A | Discharge status: Home / Self Care | Payer: Medicaid Other | Attending: Emergency Medicine | Admitting: Emergency Medicine

## 2012-03-14 DIAGNOSIS — R0989 Other specified symptoms and signs involving the circulatory and respiratory systems: Secondary | ICD-10-CM | POA: Insufficient documentation

## 2012-03-14 DIAGNOSIS — R112 Nausea with vomiting, unspecified: Secondary | ICD-10-CM | POA: Insufficient documentation

## 2012-03-14 DIAGNOSIS — M79609 Pain in unspecified limb: Secondary | ICD-10-CM | POA: Insufficient documentation

## 2012-03-14 DIAGNOSIS — I10 Essential (primary) hypertension: Secondary | ICD-10-CM | POA: Insufficient documentation

## 2012-03-14 DIAGNOSIS — R509 Fever, unspecified: Secondary | ICD-10-CM

## 2012-03-14 DIAGNOSIS — Z8659 Personal history of other mental and behavioral disorders: Secondary | ICD-10-CM | POA: Insufficient documentation

## 2012-03-14 DIAGNOSIS — R209 Unspecified disturbances of skin sensation: Secondary | ICD-10-CM | POA: Insufficient documentation

## 2012-03-14 DIAGNOSIS — N390 Urinary tract infection, site not specified: Secondary | ICD-10-CM | POA: Insufficient documentation

## 2012-03-14 DIAGNOSIS — Z79899 Other long term (current) drug therapy: Secondary | ICD-10-CM | POA: Insufficient documentation

## 2012-03-14 LAB — CBC
HCT: 28.5 % — ABNORMAL LOW (ref 39.0–52.0)
MCV: 84.8 fL (ref 78.0–100.0)
Platelets: 372 10*3/uL (ref 150–400)
RBC: 3.36 MIL/uL — ABNORMAL LOW (ref 4.22–5.81)
RDW: 15.7 % — ABNORMAL HIGH (ref 11.5–15.5)
WBC: 15.5 10*3/uL — ABNORMAL HIGH (ref 4.0–10.5)

## 2012-03-14 LAB — DIFFERENTIAL
Basophils Absolute: 0 10*3/uL (ref 0.0–0.1)
Eosinophils Relative: 0 % (ref 0–5)
Lymphocytes Relative: 8 % — ABNORMAL LOW (ref 12–46)
Lymphs Abs: 1.3 10*3/uL (ref 0.7–4.0)
Neutro Abs: 12.6 10*3/uL — ABNORMAL HIGH (ref 1.7–7.7)

## 2012-03-14 LAB — BASIC METABOLIC PANEL
CO2: 16 mEq/L — ABNORMAL LOW (ref 19–32)
Chloride: 105 mEq/L (ref 96–112)
Glucose, Bld: 154 mg/dL — ABNORMAL HIGH (ref 70–99)
Sodium: 135 mEq/L (ref 135–145)

## 2012-03-14 MED ORDER — ACETAMINOPHEN 325 MG PO TABS
650.0000 mg | ORAL_TABLET | Freq: Once | ORAL | Status: AC
  Administered 2012-03-14: 650 mg via ORAL
  Filled 2012-03-14: qty 2

## 2012-03-14 NOTE — ED Notes (Signed)
Patient states his body has been having chills and feels like there are needles sticking in his legs.  States he had surgery March 12 on his neck and ever since then he has been having this feeling of needles sticking in legs.  Asked if he spoke to his MD about this feeling and he stated no.  No difficulty walking.

## 2012-03-14 NOTE — ED Notes (Signed)
PT. REPORTS CHILLS AND BILATERAL LOWER LEG PAIN ONSET TODAY, DENIES INJURY , AMBULATORY .

## 2012-03-15 ENCOUNTER — Emergency Department (HOSPITAL_COMMUNITY): Payer: Medicaid Other

## 2012-03-15 LAB — URINALYSIS, ROUTINE W REFLEX MICROSCOPIC
Glucose, UA: NEGATIVE mg/dL
Specific Gravity, Urine: 1.008 (ref 1.005–1.030)
pH: 5.5 (ref 5.0–8.0)

## 2012-03-15 LAB — URINE MICROSCOPIC-ADD ON

## 2012-03-15 MED ORDER — ONDANSETRON 8 MG PO TBDP: 8.0000 mg | ORAL_TABLET | Freq: Three times a day (TID) | ORAL | Status: AC | PRN

## 2012-03-15 MED ORDER — CIPROFLOXACIN IN D5W 400 MG/200ML IV SOLN
400.0000 mg | Freq: Once | INTRAVENOUS | Status: AC
  Administered 2012-03-15: 400 mg via INTRAVENOUS
  Filled 2012-03-15: qty 200

## 2012-03-15 MED ORDER — CIPROFLOXACIN HCL 500 MG PO TABS: 500.0000 mg | ORAL_TABLET | Freq: Two times a day (BID) | ORAL | Status: AC

## 2012-03-15 MED ORDER — SODIUM CHLORIDE 0.9 % IV SOLN
Freq: Once | INTRAVENOUS | Status: AC
  Administered 2012-03-15: 01:00:00 via INTRAVENOUS

## 2012-03-15 NOTE — ED Provider Notes (Signed)
History     CSN: [id number]  Arrival date & time 03/14/12  2230   First MD Initiated Contact with Patient 03/14/12 2338      Chief Complaint  Patient presents with  . Chills    (Consider location/radiation/quality/duration/timing/severity/associated sxs/prior treatment) Patient is a 66 y.o. male presenting with vomiting. The history is provided by the patient.  Emesis  This is a new problem. The problem occurs 2 to 4 times per day. The problem has not changed since onset.The maximum temperature recorded prior to his arrival was 101 to 101.9 F. The fever has been present for 3 to 4 days. Pertinent negatives include no chills. Associated symptoms comments: He reports having had surgery 03/09/12 on his anterior neck "to remove a tumor". Since discharge from the hospital he has had nausea, vomiting, chills and bilateral leg tingling and foot pain that cause walking to be difficult..    Past Medical History  Diagnosis Date  . Schizophrenia     previously on zyprexa  . Primary hyperparathyroidism     s/p parathyroid adenoma resection Oct. 8, 2011, repeat scan showed residual right adenoma, however pt did not f/u with surgery as outpt  . Hypercalcemia     2/2 primary hyperparathyroidism  . History of nephrolithiasis 2008    s/p ureterolithotomy   . Pancreatitis 2011    history of several admissions for pancreatitis   . Alcoholism   . Chronic renal insufficiency     with history of acute on chronic secondary to dehydration and hypercalcemia  . Altered mental status 2011    hx of 2/2 hypercalcemia, EtOH w/d and medication overuse  . Hypertension   . Tobacco abuse   . Fx humeral neck 2008    right side, no history of surgical repair  . Encephalopathy   . Parathyroid adenoma   . Angina     mild pain on arm, left chest, moves down left chest    Past Surgical History  Procedure Date  . Parathyroid adenoma resection Oct. 8, 2011    residual adenoma Oct. 12, 2011  . Ankle pinning     . Open left proximal ureterolithotomy 09/18/2007  . Cystoscopy, laser cystolithoplaxy 08/16/2007  . Cysto, removal of bladder stone, attempted stent placement 08/15/2007  . Thyroidectomy 03/09/12  . Kidney stone surgery 07/2007 X2    /E-chart  . Parathyroidectomy 03/09/2012    Procedure: PARATHYROIDECTOMY;  Surgeon: Pedro Earls, MD;  Location: [facility];  Service: General;  Laterality: N/A;  mediastinal paraqthyroidectomy     No family history on file.  History  Substance Use Topics  . Smoking status: Current Everyday Smoker -- 1.0 packs/day for 50 years    Types: Cigarettes  . Smokeless tobacco: Never Used  . Alcohol Use: Yes     03/09/12 "last alcohol 1983"; history of heavy alcohol use      Review of Systems  Constitutional: Negative for chills.  HENT:       He denies pain or drainage at the surgical site to neck.  Respiratory: Negative.   Cardiovascular: Negative.   Gastrointestinal: Positive for vomiting.  Musculoskeletal: Negative.   Skin: Negative.   Neurological: Negative.   Psychiatric/Behavioral: Negative.     Allergies  Review of patient's allergies indicates no known allergies.  Home Medications   Current Outpatient Rx  Name Route Sig Dispense Refill  . OLANZAPINE 20 MG PO TABS Oral Take 20 mg by mouth at bedtime.     . TRAZODONE HCL 100 MG  PO TABS Oral Take 100 mg by mouth at bedtime.        BP 141/83  Pulse 100  Temp(Src) 101.1 F (38.4 C) (Oral)  Resp 18  SpO2 98%  Physical Exam  Constitutional: He appears well-developed and well-nourished. No distress.  HENT:  Head: Normocephalic.  Mouth/Throat: Oropharynx is clear and moist.       Healing surgical site to anterior neck without drainage, tenderness, redness or mass.  Neck: Normal range of motion. Neck supple.  Cardiovascular: Normal rate and regular rhythm.   No murmur heard. Pulmonary/Chest: Effort normal. He has rales. He exhibits no tenderness.  Abdominal: Soft. Bowel sounds are normal.  There is no tenderness. There is no rebound and no guarding.  Musculoskeletal: Normal range of motion.       Legs bilaterally unremarkable. No swelling, redness or point tenderness. Pulses 2+.   Neurological: He is alert. No cranial nerve deficit.  Skin: Skin is warm and dry. No rash noted.  Psychiatric: He has a normal mood and affect.    ED Course  Procedures (including critical care time)  Labs Reviewed  CBC - Abnormal; Notable for the following:    WBC 15.5 (*)    RBC 3.36 (*)    Hemoglobin 9.4 (*)    HCT 28.5 (*)    RDW 15.7 (*)    All other components within normal limits  DIFFERENTIAL - Abnormal; Notable for the following:    Neutrophils Relative 81 (*)    Neutro Abs 12.6 (*)    Lymphocytes Relative 8 (*)    Monocytes Absolute 1.6 (*)    All other components within normal limits  BASIC METABOLIC PANEL - Abnormal; Notable for the following:    CO2 16 (*)    Glucose, Bld 154 (*)    BUN 45 (*)    Creatinine, Ser 4.04 (*)    Calcium 8.1 (*)    GFR calc non Af Amer 14 (*)    GFR calc Af Amer 17 (*)    All other components within normal limits  URINALYSIS, ROUTINE W REFLEX MICROSCOPIC   No results found.   No diagnosis found.    MDM  Waiting on lab studies to be completed. Do not feel fever and illness related to previous surgery as site is subjectively nonpainful, it is nontender and appears to be healing well. Patient care turned over to Linton Flemings, MD, as labs pending. He is currently comfortable without complaint.        Leotis Shames, PA-C 03/15/12 0122  Leotis Shames, PA-C 03/15/12 [phone]

## 2012-03-15 NOTE — ED Provider Notes (Signed)
Medical screening examination/treatment/procedure(s) were conducted as a shared visit with non-physician practitioner(s) and myself.  I personally evaluated the patient during the encounter. Patient with onset of fever and vomiting. Recent surgery for parathyroid syndrome. Surgical site clean dry and intact no signs of infection. Patient noted to have urinary tract infection. Will treat with ciprofloxacin, as his urine cultures in the past have been sensitive to same. Blood cultures were sent in case of occult septicemia, although patient overall looks well. Patient updated on findings and plan  Kalman Drape, MD 03/15/12 [phone]

## 2012-03-15 NOTE — Discharge Instructions (Signed)
Please take medications as prescribed. Do not miss any doses. Followup with your Dr. for recheck in 2-3 days. Return to the emergency department for worsening fevers, pain, persistent vomiting despite medications, or other new concerning symptoms.  Fever  Fever is a higher-than-normal body temperature. A normal temperature varies with:  Age.   How it is measured (mouth, underarm, rectal, or ear).   Time of day.  In an adult, an oral temperature around 98.6 Fahrenheit (F) or 37 Celsius (C) is considered normal. A rise in temperature of about 1.8 F or 1 C is generally considered a fever (100.4 F or 38 C). In an infant age 61 days or less, a rectal temperature of 100.4 F (38 C) generally is regarded as fever. Fever is not a disease but can be a symptom of illness. CAUSES   Fever is most commonly caused by infection.   Some non-infectious problems can cause fever. For example:   Some arthritis problems.   Problems with the thyroid or adrenal glands.   Immune system problems.   Some kinds of cancer.   A reaction to certain medicines.   Occasionally, the source of a fever cannot be determined. This is sometimes called a "Fever of Unknown Origin" (FUO).   Some situations may lead to a temporary rise in body temperature that may go away on its own. Examples are:   Childbirth.   Surgery.   Some situations may cause a rise in body temperature but these are not considered "true fever". Examples are:   Intense exercise.   Dehydration.   Exposure to high outside or room temperatures.  SYMPTOMS   Feeling warm or hot.   Fatigue or feeling exhausted.   Aching all over.   Chills.   Shivering.   Sweats.  DIAGNOSIS  A fever can be suspected by your caregiver feeling that your skin is unusually warm. The fever is confirmed by taking a temperature with a thermometer. Temperatures can be taken different ways. Some methods are accurate and some are not: With adults,  adolescents, and children:   An oral temperature is used most commonly.   An ear thermometer will only be accurate if it is positioned as recommended by the manufacturer.   Under the arm temperatures are not accurate and not recommended.   Most electronic thermometers are fast and accurate.  Infants and Toddlers:  Rectal temperatures are recommended and most accurate.   Ear temperatures are not accurate in this age group and are not recommended.   Skin thermometers are not accurate.  RISKS AND COMPLICATIONS   During a fever, the body uses more oxygen, so a person with a fever may develop rapid breathing or shortness of breath. This can be dangerous especially in people with heart or lung disease.   The sweats that occur following a fever can cause dehydration.   High fever can cause seizures in infants and children.   Older persons can develop confusion during a fever.  TREATMENT   Medications may be used to control temperature.   Do not give aspirin to children with fevers. There is an association with Reye's syndrome. Reye's syndrome is a rare but potentially deadly disease.   If an infection is present and medications have been prescribed, take them as directed. Finish the full course of medications until they are gone.   Sponging or bathing with room-temperature water may help reduce body temperature. Do not use ice water or alcohol sponge baths.   Do not over-bundle  children in blankets or heavy clothes.   Drinking adequate fluids during an illness with fever is important to prevent dehydration.  HOME CARE INSTRUCTIONS   For adults, rest and adequate fluid intake are important. Dress according to how you feel, but do not over-bundle.   Drink enough water and/or fluids to keep your urine clear or pale yellow.   For infants over 3 months and children, giving medication as directed by your caregiver to control fever can help with comfort. The amount to be given is based  on the child's weight. Do NOT give more than is recommended.  SEEK MEDICAL CARE IF:   You or your child are unable to keep fluids down.   Vomiting or diarrhea develops.   You develop a skin rash.   An oral temperature above 102 F (38.9 C) develops, or a fever which persists for over 3 days.   You develop excessive weakness, dizziness, fainting or extreme thirst.   Fevers keep coming back after 3 days.  SEEK IMMEDIATE MEDICAL CARE IF:   Shortness of breath or trouble breathing develops   You pass out.   You feel you are making little or no urine.   New pain develops that was not there before (such as in the head, neck, chest, back, or abdomen).   You cannot hold down fluids.   Vomiting and diarrhea persist for more than a day or two.   You develop a stiff neck and/or your eyes become sensitive to light.   An unexplained temperature above 102 F (38.9 C) develops.  Document Released: 11/14/2005 Document Revised: 11/03/2011 Document Reviewed: 10/30/2008 [facility] Patient Information 2012 [place], [place].  Urinary Tract Infection A urinary tract infection (UTI) is often caused by a germ (bacteria). A UTI is usually helped with medicine (antibiotics) that kills germs. Take all the medicine until it is gone. Do this even if you are feeling better. You are usually better in 7 to 10 days. HOME CARE   Drink enough water and fluids to keep your pee (urine) clear or pale yellow. Drink:   Cranberry juice.   Water.   Avoid:   Caffeine.   Tea.   Bubbly (carbonated) drinks.   Alcohol.   Only take medicine as told by your doctor.   To prevent further infections:   Pee often.   After pooping (bowel movement), women should wipe from front to back. Use each tissue only once.   Pee before and after having sex (intercourse).  Ask your doctor when your test results will be ready. Make sure you follow up and get your test results.  GET HELP RIGHT AWAY IF:   There is very  bad back pain or lower belly (abdominal) pain.   You get the chills.   You have a fever.   Your baby is older than 3 months with a rectal temperature of 102 F (38.9 C) or higher.   Your baby is 69 months old or younger with a rectal temperature of 100.4 F (38 C) or higher.   You feel sick to your stomach (nauseous) or throw up (vomit).   There is continued burning with peeing.   Your problems are not better in 3 days. Return sooner if you are getting worse.  MAKE SURE YOU:   Understand these instructions.   Will watch your condition.   Will get help right away if you are not doing well or get worse.  Document Released: 05/02/2008 Document Revised: 11/03/2011 Document Reviewed: 05/02/2008  [employer] Patient Information 2012 [place], [place].

## 2012-03-21 LAB — CULTURE, BLOOD (ROUTINE X 2)
Culture  Setup Time: 201304180908
Culture  Setup Time: 201304180908
Culture: NO GROWTH

## 2012-03-23 NOTE — Discharge Summary (Signed)
Physician Discharge Summary  Patient ID: [name] MRN: [id number] DOB/AGE: 1945-12-27 66 y.o.  Admit date: 03/09/2012 Discharge date: 03/11/12  Admission Diagnoses:  Primary hyperparathyroidism  Discharge Diagnoses:  same  Active Problems:  * No active hospital problems. *    Surgery:  Resection of 8.8 gm right parathyroid adenoma  Discharged Condition: improved  Hospital Course:   Had surgery and managed for 2 days  Consults: none  Significant Diagnostic Studies: none    Discharge Exam: Blood pressure 133/75, pulse 69, temperature 98.4 F (36.9 C), temperature source Oral, resp. rate 22, height 5\' 9"  (1.753 m), weight 157 lb (71.215 kg), SpO2 100.00%. Not done by me.   Disposition: 01-Home or Self Care  Discharge Orders    Future Orders Please Complete By Expires   Diet - low sodium heart healthy      Increase activity slowly      Discharge instructions      Comments:   [facility], [place] [phone]  THYROID/ PARATHYROID SURGERY: POST OP INSTRUCTIONS  Always review your discharge instruction sheet given to you by the facility where your surgery was performed.  IF YOU HAVE DISABILITY OR FAMILY LEAVE FORMS, YOU MUST BRING THEM TO THE OFFICE FOR PROCESSING.  PLEASE DO NOT GIVE THEM TO YOUR DOCTOR.  A prescription for pain medication may be given to you upon discharge.  Take your pain medication as prescribed, if needed.  If narcotic pain medicine is not needed, then you may take acetaminophen (Tylenol) or ibuprofen (Advil) as needed. Take your usually prescribed medications unless otherwise directed. If you need a refill on your pain medication, please contact your pharmacy. They will contact our office to request authorization.  Prescriptions will not be filled after 5pm or on week-ends. You should follow a light diet the first 24 hours after arrival home, such as soup and crackers, etc.  Be sure to include lots of fluids daily.  Resume  your normal diet the day after surgery. Most patients will experience some swelling and bruising on the chest and neck area.  Ice packs will help.  Swelling and bruising can take several days to resolve.  It is common to experience some constipation if taking pain medication after surgery.  Increasing fluid intake and taking a stool softener will usually help or prevent this problem from occurring.  A mild laxative (Milk of Magnesia or Miralax) should be taken according to package directions if there are no bowel movements after 48 hours. Unless discharge instructions indicate otherwise, you may remove your bandages 24-48 hours after surgery, and you may shower at that time.  You may have steri-strips (small skin tapes) in place directly over the incision.  These strips should be left on the skin for 7-10 days.  If your surgeon used skin glue on the incision, you may shower in 24 hours.  The glue will flake off over the next 2-3 weeks.  Any sutures or staples will be removed at the office during your follow-up visit. ACTIVITIES:  You may resume regular (light) daily activities beginning the next day--such as daily self-care, walking, climbing stairs--gradually increasing activities as tolerated.  You may have sexual intercourse when it is comfortable.  Refrain from any heavy lifting or straining until approved by your doctor. You may drive when you no longer are taking prescription pain medication, you can comfortably wear a seatbelt, and you can safely maneuver your car and apply brakes RETURN TO WORK:  __________________________________________________________ [name] should see your doctor in the office for a follow-up appointment approximately two weeks after your surgery.  Make sure that you call for this appointment within a day or two after you arrive home to insure a convenient appointment time. OTHER INSTRUCTIONS: ____________________________________________________________________________  _________________________________________________________________________________________________________________ _________________________________________________________________________________________________________________   WHEN TO CALL YOUR DOCTOR: Fever over 101.0 Inability to urinate Nausea and/or vomiting Extreme swelling or bruising Continued bleeding from incision. Increased pain, redness, or drainage from the incision. Difficulty swallowing or breathing Muscle cramping or spasms. Numbness or tingling in hands or feet or around lips.  The clinic staff is available to answer your questions during regular business hours.  Please don't hesitate to call and ask to speak to one of the nurses if you have concerns.  For further questions, please visit [email or web address]    No dressing needed        Medication List  As of 03/23/2012 12:33 PM   STOP taking these medications         alendronate 70 MG tablet      amLODipine 10 MG tablet      labetalol 100 MG tablet         TAKE these medications         OLANZapine 20 MG tablet   Commonly known as: ZYPREXA   Take 20 mg by mouth at bedtime.      traZODone 100 MG tablet   Commonly known as: DESYREL   Take 100 mg by mouth at bedtime.           Follow-up Information    Follow up with Johnathan Hausen B, MD. Schedule an appointment as soon as possible for a visit in 2 weeks.   Contact information:   [employer] [employer], [place], [place] [phone]          Signed: Pedro Earls 03/23/2012, 12:33 PM

## 2013-04-24 ENCOUNTER — Emergency Department (HOSPITAL_COMMUNITY)
Admission: EM | Admit: 2013-04-24 | Discharge: 2013-04-24 | Disposition: A | Discharge status: Home / Self Care | Payer: Medicaid Other | Attending: Emergency Medicine | Admitting: Emergency Medicine

## 2013-04-24 DIAGNOSIS — Z85858 Personal history of malignant neoplasm of other endocrine glands: Secondary | ICD-10-CM | POA: Insufficient documentation

## 2013-04-24 DIAGNOSIS — Z8639 Personal history of other endocrine, nutritional and metabolic disease: Secondary | ICD-10-CM | POA: Insufficient documentation

## 2013-04-24 DIAGNOSIS — I129 Hypertensive chronic kidney disease with stage 1 through stage 4 chronic kidney disease, or unspecified chronic kidney disease: Secondary | ICD-10-CM | POA: Insufficient documentation

## 2013-04-24 DIAGNOSIS — E210 Primary hyperparathyroidism: Secondary | ICD-10-CM | POA: Insufficient documentation

## 2013-04-24 DIAGNOSIS — Z8719 Personal history of other diseases of the digestive system: Secondary | ICD-10-CM | POA: Insufficient documentation

## 2013-04-24 DIAGNOSIS — F17200 Nicotine dependence, unspecified, uncomplicated: Secondary | ICD-10-CM | POA: Insufficient documentation

## 2013-04-24 DIAGNOSIS — F209 Schizophrenia, unspecified: Secondary | ICD-10-CM | POA: Insufficient documentation

## 2013-04-24 DIAGNOSIS — N390 Urinary tract infection, site not specified: Secondary | ICD-10-CM | POA: Insufficient documentation

## 2013-04-24 DIAGNOSIS — Z8781 Personal history of (healed) traumatic fracture: Secondary | ICD-10-CM | POA: Insufficient documentation

## 2013-04-24 DIAGNOSIS — Z87442 Personal history of urinary calculi: Secondary | ICD-10-CM | POA: Insufficient documentation

## 2013-04-24 DIAGNOSIS — I10 Essential (primary) hypertension: Secondary | ICD-10-CM

## 2013-04-24 DIAGNOSIS — N189 Chronic kidney disease, unspecified: Secondary | ICD-10-CM | POA: Insufficient documentation

## 2013-04-24 DIAGNOSIS — Z862 Personal history of diseases of the blood and blood-forming organs and certain disorders involving the immune mechanism: Secondary | ICD-10-CM | POA: Insufficient documentation

## 2013-04-24 LAB — URINE MICROSCOPIC-ADD ON

## 2013-04-24 LAB — CBC
HCT: 38.2 % — ABNORMAL LOW (ref 39.0–52.0)
MCHC: 32.2 g/dL (ref 30.0–36.0)
Platelets: 360 10*3/uL (ref 150–400)
RDW: 14.8 % (ref 11.5–15.5)
WBC: 7.5 10*3/uL (ref 4.0–10.5)

## 2013-04-24 LAB — RAPID URINE DRUG SCREEN, HOSP PERFORMED
Amphetamines: NOT DETECTED
Benzodiazepines: NOT DETECTED
Cocaine: NOT DETECTED

## 2013-04-24 LAB — SALICYLATE LEVEL: Salicylate Lvl: <2 mg/dL — ABNORMAL LOW (ref 2.8–20.0)

## 2013-04-24 LAB — URINALYSIS, ROUTINE W REFLEX MICROSCOPIC
Bilirubin Urine: NEGATIVE
Glucose, UA: NEGATIVE mg/dL
Ketones, ur: NEGATIVE mg/dL
pH: 6 (ref 5.0–8.0)

## 2013-04-24 LAB — COMPREHENSIVE METABOLIC PANEL
AST: 11 U/L (ref 0–37)
Albumin: 3.4 g/dL — ABNORMAL LOW (ref 3.5–5.2)
Alkaline Phosphatase: 103 U/L (ref 39–117)
BUN: 24 mg/dL — ABNORMAL HIGH (ref 6–23)
Creatinine, Ser: 2.14 mg/dL — ABNORMAL HIGH (ref 0.50–1.35)
Potassium: 4.1 mEq/L (ref 3.5–5.1)
Total Protein: 7.6 g/dL (ref 6.0–8.3)

## 2013-04-24 LAB — ETHANOL: Alcohol, Ethyl (B): <11 mg/dL (ref 0–11)

## 2013-04-24 LAB — ACETAMINOPHEN LEVEL: Acetaminophen (Tylenol), Serum: <15 ug/mL (ref 10–30)

## 2013-04-24 MED ORDER — LABETALOL HCL 100 MG PO TABS
100.0000 mg | ORAL_TABLET | Freq: Once | ORAL | Status: AC
  Administered 2013-04-24: 100 mg via ORAL
  Filled 2013-04-24: qty 1

## 2013-04-24 MED ORDER — LABETALOL HCL 100 MG PO TABS: 100.0000 mg | ORAL_TABLET | Freq: Two times a day (BID) | ORAL | Status: DC

## 2013-04-24 MED ORDER — AMLODIPINE BESYLATE 10 MG PO TABS: 10.0000 mg | ORAL_TABLET | Freq: Every day | ORAL | Status: DC

## 2013-04-24 MED ORDER — AMLODIPINE BESYLATE 10 MG PO TABS
10.0000 mg | ORAL_TABLET | Freq: Once | ORAL | Status: AC
  Administered 2013-04-24: 10 mg via ORAL
  Filled 2013-04-24: qty 1

## 2013-04-24 MED ORDER — NITROFURANTOIN MONOHYD MACRO 100 MG PO CAPS: 100.0000 mg | ORAL_CAPSULE | Freq: Two times a day (BID) | ORAL | Status: DC

## 2013-04-24 NOTE — ED Notes (Addendum)
Pt sent from [facility] to be medically cleared and evaluated HTN. Pt has no known medical hx. Pt IV with HI thoughts. Hx of schizophrenia

## 2013-04-24 NOTE — ED Provider Notes (Signed)
History     CSN: [id number]  Arrival date & time 04/24/13  1159   First MD Initiated Contact with Patient 04/24/13 1236      Chief Complaint  Patient presents with  . Medical Clearance    (Consider location/radiation/quality/duration/timing/severity/associated sxs/prior treatment) HPI Pt is a 67yo male sent from [facility] to be medically cleared and evaluated for HTN. Pt's BP has gottne as his as 196/101 at [facility] over the past few days.  Pt has hx of schizophrenia, which is why he is at [facility].  Pt refuses to take medications.  Pt has hx of HTN but is not on any medication for HTN.  Today pt states he feels fine.  Denies any fever, cough, SOB, n/v/d.  Pt is not in any pain.    Past Medical History  Diagnosis Date  . Schizophrenia     previously on zyprexa  . Primary hyperparathyroidism     s/p parathyroid adenoma resection Oct. 8, 2011, repeat scan showed residual right adenoma, however pt did not f/u with surgery as outpt  . Hypercalcemia     2/2 primary hyperparathyroidism  . History of nephrolithiasis 2008    s/p ureterolithotomy   . Pancreatitis 2011    history of several admissions for pancreatitis   . Alcoholism   . Chronic renal insufficiency     with history of acute on chronic secondary to dehydration and hypercalcemia  . Altered mental status 2011    hx of 2/2 hypercalcemia, EtOH w/d and medication overuse  . Hypertension   . Tobacco abuse   . Fx humeral neck 2008    right side, no history of surgical repair  . Encephalopathy   . Parathyroid adenoma   . Angina     mild pain on arm, left chest, moves down left chest    Past Surgical History  Procedure Laterality Date  . Parathyroid adenoma resection  Oct. 8, 2011    residual adenoma Oct. 12, 2011  . Ankle pinning    . Open left proximal ureterolithotomy  09/18/2007  . Cystoscopy, laser cystolithoplaxy  08/16/2007  . Cysto, removal of bladder stone, attempted stent placement  08/15/2007  . Thyroidectomy   03/09/12  . Kidney stone surgery  07/2007 X2    /E-chart  . Parathyroidectomy  03/09/2012    Procedure: PARATHYROIDECTOMY;  Surgeon: Pedro Earls, MD;  Location: [facility];  Service: General;  Laterality: N/A;  mediastinal paraqthyroidectomy     No family history on file.  History  Substance Use Topics  . Smoking status: Current Every Day Smoker -- 1.00 packs/day for 50 years    Types: Cigarettes  . Smokeless tobacco: Never Used  . Alcohol Use: Yes     Comment: 03/09/12 "last alcohol 1983"; history of heavy alcohol use      Review of Systems  Constitutional: Negative for fever and chills.  Respiratory: Negative for cough, shortness of breath and wheezing.   Cardiovascular: Negative for chest pain.  Gastrointestinal: Negative for nausea, vomiting and diarrhea.  Genitourinary: Negative for dysuria, frequency and flank pain.  Musculoskeletal: Negative for back pain.  Neurological: Negative for headaches.  All other systems reviewed and are negative.    Allergies  Review of patient's allergies indicates no known allergies.  Home Medications   Current Outpatient Rx  Name  Route  Sig  Dispense  Refill  . amLODipine (NORVASC) 10 MG tablet   Oral   Take 1 tablet (10 mg total) by mouth daily.   [place]  tablet   0   . labetalol (NORMODYNE) 100 MG tablet   Oral   Take 1 tablet (100 mg total) by mouth 2 (two) times daily.   30 tablet   0   . nitrofurantoin, macrocrystal-monohydrate, (MACROBID) 100 MG capsule   Oral   Take 1 capsule (100 mg total) by mouth 2 (two) times daily.   10 capsule   0     BP 192/94  Pulse 59  Temp(Src) 98.3 F (36.8 C) (Oral)  Resp 18  SpO2 100%  Physical Exam  Nursing note and vitals reviewed. Constitutional: He appears well-developed and well-nourished. No distress.  Pt sitting comfortably in exam room, accompanied by security guard.   HENT:  Head: Normocephalic and atraumatic.  Mouth/Throat: Oropharynx is clear and moist. No oropharyngeal  exudate.  Eyes: Conjunctivae are normal. No scleral icterus.  Neck: Normal range of motion. Neck supple.  Cardiovascular: Normal rate, regular rhythm and normal heart sounds.   Pulmonary/Chest: Effort normal and breath sounds normal. No respiratory distress. He has no wheezes. He has no rales. He exhibits no tenderness.  Abdominal: Bowel sounds are normal. He exhibits no distension and no mass. There is no tenderness. There is no rebound and no guarding.  Musculoskeletal: Normal range of motion.  Skin: Skin is warm and dry. He is not diaphoretic.  Psychiatric: He has a normal mood and affect. His behavior is normal.  Pt is being pleasant and cooperative at this time.     ED Course  Procedures (including critical care time)  Labs Reviewed  CBC - Abnormal; Notable for the following:    Hemoglobin 12.3 (*)    HCT 38.2 (*)    All other components within normal limits  COMPREHENSIVE METABOLIC PANEL - Abnormal; Notable for the following:    Sodium 134 (*)    Glucose, Bld 152 (*)    BUN 24 (*)    Creatinine, Ser 2.14 (*)    Calcium 11.3 (*)    Albumin 3.4 (*)    Total Bilirubin 0.2 (*)    GFR calc non Af Amer 31 (*)    GFR calc Af Amer 36 (*)    All other components within normal limits  SALICYLATE LEVEL - Abnormal; Notable for the following:    Salicylate Lvl 123456 (*)    All other components within normal limits  URINALYSIS, ROUTINE W REFLEX MICROSCOPIC - Abnormal; Notable for the following:    APPearance CLOUDY (*)    Hgb urine dipstick SMALL (*)    Protein, ur 30 (*)    Leukocytes, UA LARGE (*)    All other components within normal limits  URINE MICROSCOPIC-ADD ON - Abnormal; Notable for the following:    Squamous Epithelial / LPF FEW (*)    Bacteria, UA MANY (*)    All other components within normal limits  URINE CULTURE  ACETAMINOPHEN LEVEL  ETHANOL  URINE RAPID DRUG SCREEN (HOSP PERFORMED)   No results found.   1. HTN (hypertension)   2. UTI (lower urinary tract  infection)       MDM  Pt with hx of schizophrenia sent from [facility] who states they have no known medical hx of pt.  Would like BP to be evaluated due to elevated readings 190s/100s over past few days.  Pt is afebrile, not c/o pain, n/v/d.  PE: pt appears well, vitals: BP-192/94, benign PE.  Looked in pt records, found that he has hx of HTN in past.  Was prescribed amlodipine 10mg   and labetalol 100mg  but is not currently on medication.  Labs were ordered.   Labs unremarkable, CMP consistent with pt's hx of stage 4 CKD.  UA which revealed: WBC TNTC, and many bacteria, many leukocyts, with few squamous cells.  Pt is afebrile and does not appear toxic. No CVAT.  Pt is able to be discharged with outpatient therapy.  Will also restart pt's previous BP medications.  Gave first dose in ED.   Prescriptions: UTI-macrobid Elevated BP-amlodipine 10mg  and labetalol 100mg  Vitals: unremarkable. Discharged in stable condition.    Discussed pt with attending during ED encounter.       Noland Fordyce, PA-C 04/24/13 1607

## 2013-04-24 NOTE — ED Provider Notes (Signed)
Medical screening examination/treatment/procedure(s) were performed by non-physician practitioner and as supervising physician I was immediately available for consultation/collaboration.    Kathalene Frames, MD 04/24/13 [phone]

## 2013-04-24 NOTE — ED Notes (Signed)
Report given to Eldred at [facility]

## 2013-04-24 NOTE — ED Notes (Signed)
Pt escorted to discharge window. Pt verbalized understanding discharge instructions. In no acute distress.  

## 2013-04-27 LAB — URINE CULTURE

## 2013-04-28 ENCOUNTER — Telehealth (HOSPITAL_COMMUNITY): Payer: Self-pay | Admitting: Emergency Medicine

## 2013-04-28 NOTE — ED Notes (Signed)
Post ED Visit - Positive Culture Follow-up  Culture report reviewed by antimicrobial stewardship pharmacist: []  Wes [place], Pharm.D., BCPS []  Heide Guile, Pharm.D., BCPS []  Alycia Rossetti, Pharm.D., BCPS []  [place], Pharm.D., BCPS, AAHIVP [x]  Legrand Como, Pharm.D., BCPS, AAHIVP  Positive urine culture Treated with Macrobid, organism sensitive to the same and no further patient follow-up is required at this time.  Kylie A Holland 04/28/2013, 5:09 PM

## 2013-09-10 ENCOUNTER — Encounter (HOSPITAL_COMMUNITY): Payer: Self-pay | Admitting: Emergency Medicine

## 2013-09-10 ENCOUNTER — Emergency Department (HOSPITAL_COMMUNITY)
Admission: EM | Admit: 2013-09-10 | Discharge: 2013-09-12 | Disposition: A | Discharge status: Home / Self Care | Payer: Medicaid Other | Attending: Emergency Medicine | Admitting: Emergency Medicine

## 2013-09-10 DIAGNOSIS — I129 Hypertensive chronic kidney disease with stage 1 through stage 4 chronic kidney disease, or unspecified chronic kidney disease: Secondary | ICD-10-CM | POA: Insufficient documentation

## 2013-09-10 DIAGNOSIS — Z8719 Personal history of other diseases of the digestive system: Secondary | ICD-10-CM | POA: Insufficient documentation

## 2013-09-10 DIAGNOSIS — I209 Angina pectoris, unspecified: Secondary | ICD-10-CM | POA: Insufficient documentation

## 2013-09-10 DIAGNOSIS — Z862 Personal history of diseases of the blood and blood-forming organs and certain disorders involving the immune mechanism: Secondary | ICD-10-CM | POA: Insufficient documentation

## 2013-09-10 DIAGNOSIS — Z8669 Personal history of other diseases of the nervous system and sense organs: Secondary | ICD-10-CM | POA: Insufficient documentation

## 2013-09-10 DIAGNOSIS — N189 Chronic kidney disease, unspecified: Secondary | ICD-10-CM | POA: Insufficient documentation

## 2013-09-10 DIAGNOSIS — Z8781 Personal history of (healed) traumatic fracture: Secondary | ICD-10-CM | POA: Insufficient documentation

## 2013-09-10 DIAGNOSIS — F1021 Alcohol dependence, in remission: Secondary | ICD-10-CM | POA: Insufficient documentation

## 2013-09-10 DIAGNOSIS — F209 Schizophrenia, unspecified: Secondary | ICD-10-CM | POA: Insufficient documentation

## 2013-09-10 DIAGNOSIS — Z87442 Personal history of urinary calculi: Secondary | ICD-10-CM | POA: Insufficient documentation

## 2013-09-10 DIAGNOSIS — F17200 Nicotine dependence, unspecified, uncomplicated: Secondary | ICD-10-CM | POA: Insufficient documentation

## 2013-09-10 DIAGNOSIS — IMO0002 Reserved for concepts with insufficient information to code with codable children: Secondary | ICD-10-CM | POA: Insufficient documentation

## 2013-09-10 DIAGNOSIS — Z79899 Other long term (current) drug therapy: Secondary | ICD-10-CM | POA: Insufficient documentation

## 2013-09-10 DIAGNOSIS — Z8639 Personal history of other endocrine, nutritional and metabolic disease: Secondary | ICD-10-CM | POA: Insufficient documentation

## 2013-09-10 LAB — CBC WITH DIFFERENTIAL/PLATELET
Basophils Relative: 0 % (ref 0–1)
Eosinophils Absolute: 0 10*3/uL (ref 0.0–0.7)
Eosinophils Relative: 1 % (ref 0–5)
Hemoglobin: 12.7 g/dL — ABNORMAL LOW (ref 13.0–17.0)
Lymphs Abs: 1.4 10*3/uL (ref 0.7–4.0)
MCH: 27.4 pg (ref 26.0–34.0)
MCHC: 32.2 g/dL (ref 30.0–36.0)
MCV: 85.1 fL (ref 78.0–100.0)
Monocytes Relative: 6 % (ref 3–12)
Neutrophils Relative %: 78 % — ABNORMAL HIGH (ref 43–77)
Platelets: 396 10*3/uL (ref 150–400)

## 2013-09-10 LAB — COMPREHENSIVE METABOLIC PANEL
ALT: 10 U/L (ref 0–53)
AST: 20 U/L (ref 0–37)
Albumin: 3.9 g/dL (ref 3.5–5.2)
Alkaline Phosphatase: 88 U/L (ref 39–117)
BUN: 36 mg/dL — ABNORMAL HIGH (ref 6–23)
Chloride: 102 mEq/L (ref 96–112)
Potassium: 4.4 mEq/L (ref 3.5–5.1)
Sodium: 137 mEq/L (ref 135–145)
Total Bilirubin: 0.3 mg/dL (ref 0.3–1.2)
Total Protein: 8.7 g/dL — ABNORMAL HIGH (ref 6.0–8.3)

## 2013-09-10 LAB — RAPID URINE DRUG SCREEN, HOSP PERFORMED
Amphetamines: NOT DETECTED
Opiates: NOT DETECTED
Tetrahydrocannabinol: NOT DETECTED

## 2013-09-10 MED ORDER — LABETALOL HCL 100 MG PO TABS
100.0000 mg | ORAL_TABLET | Freq: Two times a day (BID) | ORAL | Status: DC
  Administered 2013-09-10 – 2013-09-12 (×4): 100 mg via ORAL
  Filled 2013-09-10 (×6): qty 1

## 2013-09-10 MED ORDER — LORAZEPAM 1 MG PO TABS: 0.0000 mg | ORAL_TABLET | Freq: Two times a day (BID) | ORAL | Status: DC

## 2013-09-10 MED ORDER — LABETALOL HCL 100 MG PO TABS
100.0000 mg | ORAL_TABLET | Freq: Once | ORAL | Status: DC
  Filled 2013-09-10: qty 1

## 2013-09-10 MED ORDER — NICOTINE 21 MG/24HR TD PT24
21.0000 mg | MEDICATED_PATCH | Freq: Every day | TRANSDERMAL | Status: DC
  Filled 2013-09-10: qty 1

## 2013-09-10 MED ORDER — TRAZODONE HCL 100 MG PO TABS
100.0000 mg | ORAL_TABLET | Freq: Every day | ORAL | Status: DC
  Administered 2013-09-11: 100 mg via ORAL
  Filled 2013-09-10: qty 1

## 2013-09-10 MED ORDER — HALOPERIDOL LACTATE 5 MG/ML IJ SOLN
5.0000 mg | Freq: Once | INTRAMUSCULAR | Status: AC
  Administered 2013-09-10: 5 mg via INTRAMUSCULAR

## 2013-09-10 MED ORDER — ZIPRASIDONE MESYLATE 20 MG IM SOLR
20.0000 mg | Freq: Once | INTRAMUSCULAR | Status: AC
  Administered 2013-09-10: 20 mg via INTRAMUSCULAR
  Filled 2013-09-10: qty 20

## 2013-09-10 MED ORDER — AMLODIPINE BESYLATE 10 MG PO TABS
10.0000 mg | ORAL_TABLET | Freq: Every day | ORAL | Status: DC
  Administered 2013-09-10 – 2013-09-12 (×3): 10 mg via ORAL
  Filled 2013-09-10 (×3): qty 1

## 2013-09-10 MED ORDER — LORAZEPAM 2 MG/ML IJ SOLN
2.0000 mg | Freq: Once | INTRAMUSCULAR | Status: AC
  Administered 2013-09-10: 2 mg via INTRAMUSCULAR

## 2013-09-10 MED ORDER — AMLODIPINE BESYLATE 10 MG PO TABS
10.0000 mg | ORAL_TABLET | ORAL | Status: DC
Start: 2013-09-10 — End: 2013-09-10
  Filled 2013-09-10: qty 1

## 2013-09-10 MED ORDER — ACETAMINOPHEN 325 MG PO TABS: 650.0000 mg | ORAL_TABLET | ORAL | Status: DC | PRN

## 2013-09-10 MED ORDER — HALOPERIDOL 5 MG PO TABS: 5.0000 mg | ORAL_TABLET | Freq: Once | ORAL | Status: AC

## 2013-09-10 MED ORDER — OLANZAPINE 10 MG PO TABS: 20.0000 mg | ORAL_TABLET | Freq: Every day | ORAL | Status: DC

## 2013-09-10 MED ORDER — IBUPROFEN 200 MG PO TABS: 600.0000 mg | ORAL_TABLET | Freq: Three times a day (TID) | ORAL | Status: DC | PRN

## 2013-09-10 MED ORDER — LORAZEPAM 1 MG PO TABS: 1.0000 mg | ORAL_TABLET | Freq: Four times a day (QID) | ORAL | Status: DC | PRN

## 2013-09-10 MED ORDER — THIAMINE HCL 100 MG/ML IJ SOLN: 100.0000 mg | Freq: Every day | INTRAMUSCULAR | Status: DC

## 2013-09-10 MED ORDER — VITAMIN B-1 100 MG PO TABS
100.0000 mg | ORAL_TABLET | Freq: Every day | ORAL | Status: DC
  Administered 2013-09-11 – 2013-09-12 (×2): 100 mg via ORAL
  Filled 2013-09-10 (×2): qty 1

## 2013-09-10 MED ORDER — ZOLPIDEM TARTRATE 5 MG PO TABS: 5.0000 mg | ORAL_TABLET | Freq: Every evening | ORAL | Status: DC | PRN

## 2013-09-10 MED ORDER — HALOPERIDOL LACTATE 5 MG/ML IJ SOLN
INTRAMUSCULAR | Status: AC
  Administered 2013-09-10: 5 mg
  Filled 2013-09-10: qty 1

## 2013-09-10 MED ORDER — LORAZEPAM 2 MG/ML IJ SOLN
1.0000 mg | Freq: Four times a day (QID) | INTRAMUSCULAR | Status: DC | PRN
  Filled 2013-09-10: qty 1

## 2013-09-10 MED ORDER — FOLIC ACID 1 MG PO TABS
1.0000 mg | ORAL_TABLET | Freq: Every day | ORAL | Status: DC
  Administered 2013-09-11 – 2013-09-12 (×2): 1 mg via ORAL
  Filled 2013-09-10 (×2): qty 1

## 2013-09-10 MED ORDER — ONDANSETRON HCL 4 MG PO TABS: 4.0000 mg | ORAL_TABLET | Freq: Three times a day (TID) | ORAL | Status: DC | PRN

## 2013-09-10 MED ORDER — ADULT MULTIVITAMIN W/MINERALS CH
1.0000 | ORAL_TABLET | Freq: Every day | ORAL | Status: DC
  Administered 2013-09-11 – 2013-09-12 (×2): 1 via ORAL
  Filled 2013-09-10: qty 1

## 2013-09-10 MED ORDER — LORAZEPAM 1 MG PO TABS: 1.0000 mg | ORAL_TABLET | Freq: Three times a day (TID) | ORAL | Status: DC | PRN

## 2013-09-10 MED ORDER — LORAZEPAM 1 MG PO TABS: 0.0000 mg | ORAL_TABLET | Freq: Four times a day (QID) | ORAL | Status: DC
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
## 2013-09-10 NOTE — ED Notes (Signed)
Positive response from med, resting quietly

## 2013-09-10 NOTE — ED Notes (Signed)
Unable to complete pt.'s psych assessment r/t pt.'s condition upon arrival to psych ED.  Also unable to orient pt. To psych ED.  Will continue to monitor pt. And assess him when he is alert.

## 2013-09-10 NOTE — BH Assessment (Signed)
This Probation officer attempted to assess patient and patient refused to talk or cooperate with assessor. Pt to be assessed in the morning by Psychiatrist or Extender. Informed EDP and pt's nurse of this.   Shaune Pollack, MS, [place] Assessment Counselor

## 2013-09-10 NOTE — ED Notes (Signed)
Notified EDP of pt.'s b/p.  New order to continue to monitor pt. And recheck pt.'s b/p at 1915 and notifiy EDP of results.

## 2013-09-10 NOTE — ED Notes (Signed)
Patient changed into  blue scrubs and wanded by security

## 2013-09-10 NOTE — ED Notes (Signed)
Notified EDP of pt.'s B/P, EDP came to see pt. Asked him if he took his B/P medication today, pt. Difficult to understand, EDP will order his home B/P medications and wants [place] to recheck his B/P again at 2000 and let him know.

## 2013-09-10 NOTE — BH Assessment (Signed)
Writer informed the incoming staff [facility]) that the patient needs an assessment.

## 2013-09-10 NOTE — ED Notes (Signed)
Notified EDP of pt.'s B/P, acute RN did not include in her verbal report pt.'s B/P of 195/121, HR of 116. No new orders given, will continue to monitor pt.'s V/S.
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
## 2013-09-10 NOTE — ED Notes (Signed)
Pt. Ambulated to bathroom, after leaving, he ambulated back to his room, urinating on the floor all the way back to his room, pt. Unaware he had done this, hallway/room/pt. Cleaned.

## 2013-09-10 NOTE — BH Assessment (Signed)
Writer was not able to assess the patient due to his level of aggression.  Patient was given Geodon at 2:30pm.  Patient continued to be aggressive and cursing at nursing staff and was given Haldol and Ativan at 4:41pm

## 2013-09-10 NOTE — ED Notes (Signed)
As pt. Was walking into psych ED from acute ED, pt. Was escorted by [employer], and [employer], pt. Was yelling....MF.....MF........notified NP, new orders given for PRN medicaitons.

## 2013-09-11 ENCOUNTER — Encounter (HOSPITAL_COMMUNITY): Payer: Self-pay | Admitting: Registered Nurse

## 2013-09-11 DIAGNOSIS — F39 Unspecified mood [affective] disorder: Secondary | ICD-10-CM

## 2013-09-11 MED ORDER — RISPERIDONE 1 MG PO TABS
1.0000 mg | ORAL_TABLET | Freq: Every day | ORAL | Status: DC
  Administered 2013-09-11: 1 mg via ORAL
  Filled 2013-09-11: qty 1

## 2013-09-11 NOTE — BH Assessment (Signed)
Writer attempted to assess patient and patient refused to talk or cooperate with assessor. Writer informed the [facility] Office that the patient has not assessed.    Writer informed the ER MD (Dr. Sharol Given) that I was not able to assess the patient.

## 2013-09-11 NOTE — ED Notes (Signed)
Pt refuses to speak with staff.

## 2013-09-11 NOTE — BH Assessment (Addendum)
Writer attempted to call pt's brother Delvin Marple for collateral info. However, phone numbers in chart review are disconnected including [phone] and [phone]. Pt sts he doesn't know brother's number. Writer couldn't find [employer] number in internet search either. Non emergency [employer] - officer will be sent to brother's address to locate brother.   Arnold Long, [place] Assessment Counselor

## 2013-09-11 NOTE — Consult Note (Signed)
[facility] Face-to-Face Psychiatry Consult   Reason for Consult:  Evaluation for inpatient treatment Referring Physician:  EDP  [name] is an 67 y.o. male.  Assessment: AXIS I:  Mood Disorder NOS AXIS II:  Deferred AXIS III:   Past Medical History  Diagnosis Date  . Schizophrenia     previously on zyprexa  . Primary hyperparathyroidism     s/p parathyroid adenoma resection Oct. 8, 2011, repeat scan showed residual right adenoma, however pt did not f/u with surgery as outpt  . Hypercalcemia     2/2 primary hyperparathyroidism  . History of nephrolithiasis 2008    s/p ureterolithotomy   . Pancreatitis 2011    history of several admissions for pancreatitis   . Alcoholism   . Chronic renal insufficiency     with history of acute on chronic secondary to dehydration and hypercalcemia  . Altered mental status 2011    hx of 2/2 hypercalcemia, EtOH w/d and medication overuse  . Hypertension   . Tobacco abuse   . Fx humeral neck 2008    right side, no history of surgical repair  . Encephalopathy   . Parathyroid adenoma   . Angina     mild pain on arm, left chest, moves down left chest   AXIS IV:  other psychosocial or environmental problems AXIS V:  51-60 moderate symptoms  Plan:  Discussed crisis plan, support from social network, calling 911, coming to the Emergency Department, and calling Suicide Hotline. Disoriented Observation until confirmation from family   Subjective:   [name] is a 67 y.o. male.  HPI:  Patient states that he is here because his brother called the police.  "I got into a fight with my brother cause of some stuff my grandmother had left behind and I wanted to keep."  Patient states that he knows he is in the hospital but doesn't know which hospital.  Pateitn states that he lives with his brother and they usuall get along.  "I wasn't trying to kill nobody."  When aske about tairing up the house patient states "I ain't tear up no house my room is  clean; My brother room is the one that has all those damn cans all over the place."  Patient states that years ago he was on Zyprexa "but it wasn't doing me any good.  I felt better after I stopped it."  Patient states that he is not having any problems with sleep or appetite.  Patient states that he has never tried to commit suicide. Patient denies suicidal ideation, homicidal ideation, psychosis, and paranoia.    Past Psychiatric History: Past Medical History  Diagnosis Date  . Schizophrenia     previously on zyprexa  . Primary hyperparathyroidism     s/p parathyroid adenoma resection Oct. 8, 2011, repeat scan showed residual right adenoma, however pt did not f/u with surgery as outpt  . Hypercalcemia     2/2 primary hyperparathyroidism  . History of nephrolithiasis 2008    s/p ureterolithotomy   . Pancreatitis 2011    history of several admissions for pancreatitis   . Alcoholism   . Chronic renal insufficiency     with history of acute on chronic secondary to dehydration and hypercalcemia  . Altered mental status 2011    hx of 2/2 hypercalcemia, EtOH w/d and medication overuse  . Hypertension   . Tobacco abuse   . Fx humeral neck 2008    right side, no history of surgical repair  .  Encephalopathy   . Parathyroid adenoma   . Angina     mild pain on arm, left chest, moves down left chest    reports that he has been smoking Cigarettes.  He has a 50 pack-year smoking history. He has never used smokeless tobacco. He reports that he drinks alcohol. He reports that he uses illicit drugs (Marijuana). History reviewed. No pertinent family history.         Allergies:  No Known Allergies  ACT Assessment Complete:  No:   Past Psychiatric History: Diagnosis:  Mood Disorder NOS  Hospitalizations:  Denies  Outpatient Care:  Denies  Substance Abuse Care:  Denies  Self-Mutilation:  Denies  Suicidal Attempts:  Denies  Homicidal Behaviors:  Denies   Violent Behaviors:  Denies   Place  of Residence:  [place] Marital Status:   Employed/Unemployed:  Unemployed Education:   Family Supports:  Brother Objective: Blood pressure 143/86, pulse 63, temperature 98 F (36.7 C), temperature source Oral, resp. rate 16, SpO2 100.00%.There is no weight on file to calculate BMI. Results for orders placed during the hospital encounter of 09/10/13 (from the past 72 hour(s))  CBC WITH DIFFERENTIAL     Status: Abnormal   Collection Time    09/10/13  2:36 PM      Result Value Range   WBC 8.8  4.0 - 10.5 K/uL   RBC 4.64  4.22 - 5.81 MIL/uL   Hemoglobin 12.7 (*) 13.0 - 17.0 g/dL   HCT 39.5  39.0 - 52.0 %   MCV 85.1  78.0 - 100.0 fL   MCH 27.4  26.0 - 34.0 pg   MCHC 32.2  30.0 - 36.0 g/dL   RDW 15.6 (*) 11.5 - 15.5 %   Platelets 396  150 - 400 K/uL   Neutrophils Relative % 78 (*) 43 - 77 %   Neutro Abs 6.8  1.7 - 7.7 K/uL   Lymphocytes Relative 16  12 - 46 %   Lymphs Abs 1.4  0.7 - 4.0 K/uL   Monocytes Relative 6  3 - 12 %   Monocytes Absolute 0.5  0.1 - 1.0 K/uL   Eosinophils Relative 1  0 - 5 %   Eosinophils Absolute 0.0  0.0 - 0.7 K/uL   Basophils Relative 0  0 - 1 %   Basophils Absolute 0.0  0.0 - 0.1 K/uL  ETHANOL     Status: None   Collection Time    09/10/13  2:36 PM      Result Value Range   Alcohol, Ethyl (B) <11  0 - 11 mg/dL   Comment:            LOWEST DETECTABLE LIMIT FOR     SERUM ALCOHOL IS 11 mg/dL     FOR MEDICAL PURPOSES ONLY  COMPREHENSIVE METABOLIC PANEL     Status: Abnormal   Collection Time    09/10/13  2:36 PM      Result Value Range   Sodium 137  135 - 145 mEq/L   Potassium 4.4  3.5 - 5.1 mEq/L   Chloride 102  96 - 112 mEq/L   CO2 19  19 - 32 mEq/L   Glucose, Bld 140 (*) 70 - 99 mg/dL   BUN 36 (*) 6 - 23 mg/dL   Creatinine, Ser 2.78 (*) 0.50 - 1.35 mg/dL   Calcium 11.0 (*) 8.4 - 10.5 mg/dL   Total Protein 8.7 (*) 6.0 - 8.3 g/dL   Albumin 3.9  3.5 -  5.2 g/dL   AST 20  0 - 37 U/L   ALT 10  0 - 53 U/L   Alkaline Phosphatase 88  39 - 117 U/L    Total Bilirubin 0.3  0.3 - 1.2 mg/dL   GFR calc non Af Amer 22 (*) >90 mL/min   GFR calc Af Amer 26 (*) >90 mL/min   Comment: (NOTE)     The eGFR has been calculated using the CKD EPI equation.     This calculation has not been validated in all clinical situations.     eGFR's persistently <90 mL/min signify possible Chronic Kidney     Disease.  URINE RAPID DRUG SCREEN (HOSP PERFORMED)     Status: None   Collection Time    09/10/13  4:11 PM      Result Value Range   Opiates NONE DETECTED  NONE DETECTED   Cocaine NONE DETECTED  NONE DETECTED   Benzodiazepines NONE DETECTED  NONE DETECTED   Amphetamines NONE DETECTED  NONE DETECTED   Tetrahydrocannabinol NONE DETECTED  NONE DETECTED   Barbiturates NONE DETECTED  NONE DETECTED   Comment:            DRUG SCREEN FOR MEDICAL PURPOSES     ONLY.  IF CONFIRMATION IS NEEDED     FOR ANY PURPOSE, NOTIFY LAB     WITHIN 5 DAYS.                LOWEST DETECTABLE LIMITS     FOR URINE DRUG SCREEN     Drug Class       Cutoff (ng/mL)     Amphetamine      1000     Barbiturate      200     Benzodiazepine   [id number]     Tricyclics       [id number]     Opiates          300     Cocaine          300     THC              50    Current Facility-Administered Medications  Medication Dose Route Frequency Provider Last Rate Last Dose  . acetaminophen (TYLENOL) tablet 650 mg  650 mg Oral Q4H PRN Hoy Morn, MD      . amLODipine (NORVASC) tablet 10 mg  10 mg Oral Daily Blanchard Kelch, MD   10 mg at 09/11/13 1101  . folic acid (FOLVITE) tablet 1 mg  1 mg Oral Daily Hoy Morn, MD   1 mg at 09/11/13 1221  . ibuprofen (ADVIL,MOTRIN) tablet 600 mg  600 mg Oral Q8H PRN Hoy Morn, MD      . labetalol (NORMODYNE) tablet 100 mg  100 mg Oral BID Blanchard Kelch, MD   100 mg at 09/11/13 1102  . LORazepam (ATIVAN) tablet 1 mg  1 mg Oral Q6H PRN Hoy Morn, MD       Or  . LORazepam (ATIVAN) injection 1 mg  1 mg Intravenous Q6H PRN Hoy Morn, MD       . LORazepam (ATIVAN) tablet 0-4 mg  0-4 mg Oral Q6H Hoy Morn, MD       Followed by  . [START ON 09/12/2013] LORazepam (ATIVAN) tablet 0-4 mg  0-4 mg Oral Q12H Hoy Morn, MD      . multivitamin with minerals tablet 1 tablet  1 tablet Oral  Daily Hoy Morn, MD   1 tablet at 09/11/13 1221  . nicotine (NICODERM CQ - dosed in mg/24 hours) patch 21 mg  21 mg Transdermal Daily Hoy Morn, MD      . OLANZapine [facility]) tablet 20 mg  20 mg Oral QHS Hoy Morn, MD      . ondansetron [facility]) tablet 4 mg  4 mg Oral Q8H PRN Hoy Morn, MD      . thiamine (VITAMIN B-1) tablet 100 mg  100 mg Oral Daily Hoy Morn, MD   100 mg at 09/11/13 1222   Or  . thiamine (B-1) injection 100 mg  100 mg Intravenous Daily Hoy Morn, MD      . traZODone (DESYREL) tablet 100 mg  100 mg Oral QHS Hoy Morn, MD      . zolpidem [facility]) tablet 5 mg  5 mg Oral QHS PRN Hoy Morn, MD       Current Outpatient Prescriptions  Medication Sig Dispense Refill  . amLODipine (NORVASC) 10 MG tablet Take 1 tablet (10 mg total) by mouth daily.  30 tablet  0  . labetalol (NORMODYNE) 100 MG tablet Take 1 tablet (100 mg total) by mouth 2 (two) times daily.  30 tablet  0    Psychiatric Specialty Exam:     Blood pressure 143/86, pulse 63, temperature 98 F (36.7 C), temperature source Oral, resp. rate 16, SpO2 100.00%.There is no weight on file to calculate BMI.  General Appearance: Casual  Eye Contact::  Good  Speech:  Clear and Coherent and Normal Rate  Volume:  Normal  Mood:  Depressed  Affect:  Depressed  Thought Process:  Circumstantial and Goal Directed  Orientation:  Other:  Place and time  Thought Content:  Rumination  Suicidal Thoughts:  No  Homicidal Thoughts:  No  Memory:  Immediate;   Good Recent;   Good Remote;   Fair  Judgement:  Fair  Insight:  Fair  Psychomotor Activity:  Normal  Concentration:  Fair  Recall:  Fair  Akathisia:  No  Handed:  Right  AIMS (if  indicated):     Assets:  Communication Skills Desire for Improvement Housing  Sleep:      Face to face interview and consult with Dr. Adele Schilder  Treatment Plan Summary: Medication management Disposition patient after speaking with family to verify patient story    Disposition:  Disposition pending verification of patient story.  Start Risperdal 1 mg QHS.  Monitor safety and stabilization.   Rankin, Shuvon, FNP-BC 09/11/2013 1:22 PM  I have personally seen the patient and agreed with the findings and involved in the treatment plan. Berniece Andreas, MD

## 2013-09-12 NOTE — ED Provider Notes (Signed)
Pt alert, content, nad.   Psych team has reassessed and states psych clear for d/c, they have arranged pts mental health follow up.    Mirna Mires, MD 09/12/13 1213

## 2013-09-12 NOTE — Progress Notes (Signed)
Pt psychiatrically stable for dc home f/u with [facility]. Pt plans to dc home by bus, csw provided pt with bus pass. RN and EDP aware.   Dorathy Kinsman, LCSW [phone]  ED CSW 09/12/2013. 1157am

## 2013-09-12 NOTE — Consult Note (Signed)
[facility] Face-to-Face Psychiatry Consult   Reason for Consult:  Evaluation for inpatient treatment Referring Physician:  EDP  [name] is an 67 y.o. male.  Assessment: AXIS I:  Mood Disorder NOS AXIS II:  Deferred AXIS III:   Past Medical History  Diagnosis Date  . Schizophrenia     previously on zyprexa  . Primary hyperparathyroidism     s/p parathyroid adenoma resection Oct. 8, 2011, repeat scan showed residual right adenoma, however pt did not f/u with surgery as outpt  . Hypercalcemia     2/2 primary hyperparathyroidism  . History of nephrolithiasis 2008    s/p ureterolithotomy   . Pancreatitis 2011    history of several admissions for pancreatitis   . Alcoholism   . Chronic renal insufficiency     with history of acute on chronic secondary to dehydration and hypercalcemia  . Altered mental status 2011    hx of 2/2 hypercalcemia, EtOH w/d and medication overuse  . Hypertension   . Tobacco abuse   . Fx humeral neck 2008    right side, no history of surgical repair  . Encephalopathy   . Parathyroid adenoma   . Angina     mild pain on arm, left chest, moves down left chest   AXIS IV:  other psychosocial or environmental problems AXIS V:  51-60 moderate symptoms  Plan:  Discussed crisis plan, support from social network, calling 911, coming to the Emergency Department, and calling Suicide Hotline. Disoriented Observation until confirmation from family   Subjective:   [name] is a 67 y.o. male.  [name] is clear he is not suicidal or homicidal.  He has no ill will towards his brother, he says.  He wantsto be discharged.  There is no evidence of psychosis at the moment.  Past Psychiatric History: Past Medical History  Diagnosis Date  . Schizophrenia     previously on zyprexa  . Primary hyperparathyroidism     s/p parathyroid adenoma resection Oct. 8, 2011, repeat scan showed residual right adenoma, however pt did not f/u with surgery as outpt  .  Hypercalcemia     2/2 primary hyperparathyroidism  . History of nephrolithiasis 2008    s/p ureterolithotomy   . Pancreatitis 2011    history of several admissions for pancreatitis   . Alcoholism   . Chronic renal insufficiency     with history of acute on chronic secondary to dehydration and hypercalcemia  . Altered mental status 2011    hx of 2/2 hypercalcemia, EtOH w/d and medication overuse  . Hypertension   . Tobacco abuse   . Fx humeral neck 2008    right side, no history of surgical repair  . Encephalopathy   . Parathyroid adenoma   . Angina     mild pain on arm, left chest, moves down left chest    reports that he has been smoking Cigarettes.  He has a 50 pack-year smoking history. He has never used smokeless tobacco. He reports that he drinks alcohol. He reports that he uses illicit drugs (Marijuana). History reviewed. No pertinent family history.         Allergies:  No Known Allergies  ACT Assessment Complete:  No:   Past Psychiatric History: Diagnosis:  Mood Disorder NOS  Hospitalizations:  Denies  Outpatient Care:  Denies  Substance Abuse Care:  Denies  Self-Mutilation:  Denies  Suicidal Attempts:  Denies  Homicidal Behaviors:  Denies   Violent Behaviors:  Denies  Place of Residence:  [place] Marital Status:   Employed/Unemployed:  Unemployed Education:   Family Supports:  Brother Objective: Blood pressure 149/79, pulse 61, temperature 97.6 F (36.4 C), temperature source Oral, resp. rate 18, SpO2 99.00%.There is no weight on file to calculate BMI. Results for orders placed during the hospital encounter of 09/10/13 (from the past 72 hour(s))  CBC WITH DIFFERENTIAL     Status: Abnormal   Collection Time    09/10/13  2:36 PM      Result Value Range   WBC 8.8  4.0 - 10.5 K/uL   RBC 4.64  4.22 - 5.81 MIL/uL   Hemoglobin 12.7 (*) 13.0 - 17.0 g/dL   HCT 39.5  39.0 - 52.0 %   MCV 85.1  78.0 - 100.0 fL   MCH 27.4  26.0 - 34.0 pg   MCHC 32.2  30.0 -  36.0 g/dL   RDW 15.6 (*) 11.5 - 15.5 %   Platelets 396  150 - 400 K/uL   Neutrophils Relative % 78 (*) 43 - 77 %   Neutro Abs 6.8  1.7 - 7.7 K/uL   Lymphocytes Relative 16  12 - 46 %   Lymphs Abs 1.4  0.7 - 4.0 K/uL   Monocytes Relative 6  3 - 12 %   Monocytes Absolute 0.5  0.1 - 1.0 K/uL   Eosinophils Relative 1  0 - 5 %   Eosinophils Absolute 0.0  0.0 - 0.7 K/uL   Basophils Relative 0  0 - 1 %   Basophils Absolute 0.0  0.0 - 0.1 K/uL  ETHANOL     Status: None   Collection Time    09/10/13  2:36 PM      Result Value Range   Alcohol, Ethyl (B) <11  0 - 11 mg/dL   Comment:            LOWEST DETECTABLE LIMIT FOR     SERUM ALCOHOL IS 11 mg/dL     FOR MEDICAL PURPOSES ONLY  COMPREHENSIVE METABOLIC PANEL     Status: Abnormal   Collection Time    09/10/13  2:36 PM      Result Value Range   Sodium 137  135 - 145 mEq/L   Potassium 4.4  3.5 - 5.1 mEq/L   Chloride 102  96 - 112 mEq/L   CO2 19  19 - 32 mEq/L   Glucose, Bld 140 (*) 70 - 99 mg/dL   BUN 36 (*) 6 - 23 mg/dL   Creatinine, Ser 2.78 (*) 0.50 - 1.35 mg/dL   Calcium 11.0 (*) 8.4 - 10.5 mg/dL   Total Protein 8.7 (*) 6.0 - 8.3 g/dL   Albumin 3.9  3.5 - 5.2 g/dL   AST 20  0 - 37 U/L   ALT 10  0 - 53 U/L   Alkaline Phosphatase 88  39 - 117 U/L   Total Bilirubin 0.3  0.3 - 1.2 mg/dL   GFR calc non Af Amer 22 (*) >90 mL/min   GFR calc Af Amer 26 (*) >90 mL/min   Comment: (NOTE)     The eGFR has been calculated using the CKD EPI equation.     This calculation has not been validated in all clinical situations.     eGFR's persistently <90 mL/min signify possible Chronic Kidney     Disease.  URINE RAPID DRUG SCREEN (HOSP PERFORMED)     Status: None   Collection Time    09/10/13  4:11 PM  Result Value Range   Opiates NONE DETECTED  NONE DETECTED   Cocaine NONE DETECTED  NONE DETECTED   Benzodiazepines NONE DETECTED  NONE DETECTED   Amphetamines NONE DETECTED  NONE DETECTED   Tetrahydrocannabinol NONE DETECTED  NONE  DETECTED   Barbiturates NONE DETECTED  NONE DETECTED   Comment:            DRUG SCREEN FOR MEDICAL PURPOSES     ONLY.  IF CONFIRMATION IS NEEDED     FOR ANY PURPOSE, NOTIFY LAB     WITHIN 5 DAYS.                LOWEST DETECTABLE LIMITS     FOR URINE DRUG SCREEN     Drug Class       Cutoff (ng/mL)     Amphetamine      1000     Barbiturate      200     Benzodiazepine   [id number]     Tricyclics       [id number]     Opiates          300     Cocaine          300     THC              50    Current Facility-Administered Medications  Medication Dose Route Frequency Provider Last Rate Last Dose  . acetaminophen (TYLENOL) tablet 650 mg  650 mg Oral Q4H PRN Hoy Morn, MD      . amLODipine (NORVASC) tablet 10 mg  10 mg Oral Daily Blanchard Kelch, MD   10 mg at 09/12/13 1040  . folic acid (FOLVITE) tablet 1 mg  1 mg Oral Daily Hoy Morn, MD   1 mg at 09/12/13 1040  . ibuprofen (ADVIL,MOTRIN) tablet 600 mg  600 mg Oral Q8H PRN Hoy Morn, MD      . labetalol (NORMODYNE) tablet 100 mg  100 mg Oral BID Blanchard Kelch, MD   100 mg at 09/12/13 1040  . LORazepam (ATIVAN) tablet 1 mg  1 mg Oral Q6H PRN Hoy Morn, MD       Or  . LORazepam (ATIVAN) injection 1 mg  1 mg Intravenous Q6H PRN Hoy Morn, MD      . LORazepam (ATIVAN) tablet 0-4 mg  0-4 mg Oral Q6H Hoy Morn, MD       Followed by  . LORazepam (ATIVAN) tablet 0-4 mg  0-4 mg Oral Q12H Hoy Morn, MD      . multivitamin with minerals tablet 1 tablet  1 tablet Oral Daily Hoy Morn, MD   1 tablet at 09/12/13 1040  . nicotine (NICODERM CQ - dosed in mg/24 hours) patch 21 mg  21 mg Transdermal Daily Hoy Morn, MD      . ondansetron [facility]) tablet 4 mg  4 mg Oral Q8H PRN Hoy Morn, MD      . risperiDONE (RISPERDAL) tablet 1 mg  1 mg Oral QHS Shuvon Rankin, NP   1 mg at 09/11/13 2217  . thiamine (VITAMIN B-1) tablet 100 mg  100 mg Oral Daily Hoy Morn, MD   100 mg at 09/12/13 1040   Or  . thiamine  (B-1) injection 100 mg  100 mg Intravenous Daily Hoy Morn, MD      . traZODone (DESYREL) tablet 100 mg  100 mg Oral QHS Metta Clines  Campos, MD   100 mg at 09/11/13 2218  . zolpidem (AMBIEN) tablet 5 mg  5 mg Oral QHS PRN Hoy Morn, MD       Current Outpatient Prescriptions  Medication Sig Dispense Refill  . amLODipine (NORVASC) 10 MG tablet Take 1 tablet (10 mg total) by mouth daily.  30 tablet  0  . labetalol (NORMODYNE) 100 MG tablet Take 1 tablet (100 mg total) by mouth 2 (two) times daily.  30 tablet  0    Psychiatric Specialty Exam:     Blood pressure 149/79, pulse 61, temperature 97.6 F (36.4 C), temperature source Oral, resp. rate 18, SpO2 99.00%.There is no weight on file to calculate BMI.  General Appearance: Casual  Eye Contact::  Good  Speech:  Clear and Coherent and Normal Rate  Volume:  Normal  Mood:  Depressed  Affect:  Depressed  Thought Process:  Circumstantial and Goal Directed  Orientation:  Other:  Place and time  Thought Content:  Rumination  Suicidal Thoughts:  No  Homicidal Thoughts:  No  Memory:  Immediate;   Good Recent;   Good Remote;   Fair  Judgement:  Fair  Insight:  Fair  Psychomotor Activity:  Normal  Concentration:  Fair  Recall:  Fair  Akathisia:  No  Handed:  Right  AIMS (if indicated):     Assets:  Communication Skills Desire for Improvement Housing  Sleep:      Face to face interview and consult with Dr. Adele Schilder  Treatment Plan Summary: Medication management Disposition patient after speaking with family to verify patient story    Disposition:  Disposition pending verification of patient story.  Start Risperdal 1 mg QHS.  Monitor safety and stabilization.   Clarene Reamer, FNP-BC 09/12/2013 11:44 AM  I have personally seen the patient and agreed with the findings and involved in the treatment plan. Berniece Andreas, MD  09/12/2013  Seth Vidovich can be discharged today as he is not threatening himself or anyone else.

## 2015-02-09 DIAGNOSIS — N401 Enlarged prostate with lower urinary tract symptoms: Secondary | ICD-10-CM | POA: Insufficient documentation

## 2015-07-07 DIAGNOSIS — M810 Age-related osteoporosis without current pathological fracture: Secondary | ICD-10-CM | POA: Insufficient documentation

## 2016-02-08 ENCOUNTER — Ambulatory Visit: Payer: Self-pay | Admitting: Family Medicine

## 2016-02-22 ENCOUNTER — Encounter: Payer: Self-pay | Admitting: Family Medicine

## 2016-02-22 ENCOUNTER — Ambulatory Visit (INDEPENDENT_AMBULATORY_CARE_PROVIDER_SITE_OTHER): Payer: Medicare Other | Admitting: Family Medicine

## 2016-02-22 VITALS — BP 130/80 | HR 78 | Ht 69.0 in | Wt 163.0 lb

## 2016-02-22 DIAGNOSIS — E785 Hyperlipidemia, unspecified: Secondary | ICD-10-CM

## 2016-02-22 DIAGNOSIS — I10 Essential (primary) hypertension: Secondary | ICD-10-CM

## 2016-02-22 DIAGNOSIS — R7303 Prediabetes: Secondary | ICD-10-CM | POA: Diagnosis not present

## 2016-02-22 DIAGNOSIS — E538 Deficiency of other specified B group vitamins: Secondary | ICD-10-CM

## 2016-02-22 DIAGNOSIS — H9193 Unspecified hearing loss, bilateral: Secondary | ICD-10-CM

## 2016-02-22 DIAGNOSIS — J301 Allergic rhinitis due to pollen: Secondary | ICD-10-CM | POA: Diagnosis not present

## 2016-02-22 DIAGNOSIS — G4700 Insomnia, unspecified: Secondary | ICD-10-CM

## 2016-02-22 DIAGNOSIS — N184 Chronic kidney disease, stage 4 (severe): Secondary | ICD-10-CM | POA: Diagnosis not present

## 2016-02-22 DIAGNOSIS — Z7689 Persons encountering health services in other specified circumstances: Secondary | ICD-10-CM

## 2016-02-22 DIAGNOSIS — E519 Thiamine deficiency, unspecified: Secondary | ICD-10-CM

## 2016-02-22 DIAGNOSIS — N400 Enlarged prostate without lower urinary tract symptoms: Secondary | ICD-10-CM | POA: Diagnosis not present

## 2016-02-22 DIAGNOSIS — E559 Vitamin D deficiency, unspecified: Secondary | ICD-10-CM

## 2016-02-22 DIAGNOSIS — K5900 Constipation, unspecified: Secondary | ICD-10-CM | POA: Diagnosis not present

## 2016-02-22 DIAGNOSIS — Z7189 Other specified counseling: Secondary | ICD-10-CM

## 2016-02-22 DIAGNOSIS — K5909 Other constipation: Secondary | ICD-10-CM

## 2016-02-22 MED ORDER — VITAMIN B-1 100 MG PO TABS: 100.0000 mg | ORAL_TABLET | Freq: Every day | ORAL | Status: DC

## 2016-02-22 MED ORDER — FOLIC ACID 1 MG PO TABS: 1.0000 mg | ORAL_TABLET | Freq: Every day | ORAL | Status: DC

## 2016-02-22 MED ORDER — DOCUSATE SODIUM 100 MG PO CAPS: 100.0000 mg | ORAL_CAPSULE | Freq: Every day | ORAL | Status: DC

## 2016-02-22 MED ORDER — AMLODIPINE BESYLATE 10 MG PO TABS: 10.0000 mg | ORAL_TABLET | Freq: Every day | ORAL | Status: DC

## 2016-02-22 MED ORDER — TRUEPLUS LANCETS 26G MISC: 1.0000 | Freq: Two times a day (BID) | Status: DC

## 2016-02-22 MED ORDER — GLUCOSE BLOOD VI STRP: 1.0000 | ORAL_STRIP | Freq: Two times a day (BID) | Status: DC

## 2016-02-22 MED ORDER — LABETALOL HCL 100 MG PO TABS: 100.0000 mg | ORAL_TABLET | Freq: Two times a day (BID) | ORAL | Status: DC

## 2016-02-22 MED ORDER — MELATONIN 1 MG PO TABS: 1.0000 | ORAL_TABLET | Freq: Every day | ORAL | Status: DC

## 2016-02-22 MED ORDER — VITAMIN D (ERGOCALCIFEROL) 1.25 MG (50000 UNIT) PO CAPS: 50000.0000 [IU] | ORAL_CAPSULE | ORAL | Status: DC

## 2016-02-22 MED ORDER — TAMSULOSIN HCL 0.4 MG PO CAPS
0.4000 mg | ORAL_CAPSULE | Freq: Every day | ORAL | Status: DC
Start: 2016-02-22 — End: 2016-10-05

## 2016-02-22 MED ORDER — FLUTICASONE PROPIONATE 50 MCG/ACT NA SUSP: 2.0000 | NASAL | Status: DC | PRN

## 2016-02-22 NOTE — Progress Notes (Signed)
Name: [name]   MRN: [id number]    DOB: [birth year]   Date:02/22/2016       Progress Note  Subjective  Chief Complaint  Chief Complaint  Patient presents with  . Hypertension  . Allergic Rhinitis   . Anemia  . Insomnia    takes Melatonin  . vitamin d deficiency  . thiamine deficiency  . Benign Prostatic Hypertrophy    Hypertension This is a chronic problem. The current episode started more than 1 year ago. The problem is controlled. Pertinent negatives include no anxiety, blurred vision, chest pain, headaches, malaise/fatigue, neck pain, palpitations or shortness of breath. Past treatments include calcium channel blockers and beta blockers. The current treatment provides moderate improvement. There are no compliance problems.  There is no history of angina, kidney disease, CAD/MI, CVA, heart failure, left ventricular hypertrophy, PVD, renovascular disease or retinopathy. There is no history of chronic renal disease or a hypertension causing med.  Anemia Presents for follow-up visit. There has been no abdominal pain, bruising/bleeding easily, fever, light-headedness, malaise/fatigue, palpitations or weight loss. Signs of blood loss that are present include hematochezia. Signs of blood loss that are not present include melena. Past treatments include folic acid. There is no history of alcohol abuse, chronic renal disease, heart failure or malabsorption.  Insomnia Primary symptoms: no difficulty falling asleep, no malaise/fatigue.  The current episode started more than one year. The onset quality is gradual. The problem has been gradually improving since onset. Past treatments include other (melatonin). The treatment provided moderate relief. PMH includes: no depression.  Benign Prostatic Hypertrophy This is a chronic problem. The current episode started more than 1 year ago. Irritative symptoms include frequency and nocturia. Irritative symptoms do not include urgency. Obstructive  symptoms include straining and a weak stream. Pertinent negatives include no chills, dysuria, hematuria or nausea. Treatments tried: flomax. The treatment provided mild relief. He has been using treatment for 2 or more years.  Sinus Problem Pertinent negatives include no chills, coughing, ear pain, headaches, neck pain, shortness of breath or sore throat.  Diabetes He presents for his follow-up diabetic visit. He has type 2 diabetes mellitus. His disease course has been stable. Pertinent negatives for hypoglycemia include no dizziness, headaches or nervousness/anxiousness. Pertinent negatives for diabetes include no blurred vision, no chest pain, no fatigue, no polydipsia, no polyphagia, no polyuria, no weakness and no weight loss. There are no hypoglycemic complications. Symptoms are stable. Pertinent negatives for diabetic complications include no CVA, PVD or retinopathy. Risk factors for coronary artery disease include diabetes mellitus and dyslipidemia. Current diabetic treatment includes diet. His breakfast blood glucose is taken between 8-9 am. His breakfast blood glucose range is generally 110-130 mg/dl. An ACE inhibitor/angiotensin II receptor blocker is not being taken. He does not see a podiatrist.Eye exam is not current.    No problem-specific assessment & plan notes found for this encounter.   Past Medical History  Diagnosis Date  . Schizophrenia ([place])     previously on zyprexa  . Primary hyperparathyroidism ([place])     s/p parathyroid adenoma resection Oct. 8, 2011, repeat scan showed residual right adenoma, however pt did not f/u with surgery as outpt  . Hypercalcemia     2/2 primary hyperparathyroidism  . History of nephrolithiasis 2008    s/p ureterolithotomy   . Pancreatitis 2011    history of several admissions for pancreatitis   . Alcoholism ([place])   . Chronic renal insufficiency     with history of acute  on chronic secondary to dehydration and hypercalcemia  . Altered mental  status 2011    hx of 2/2 hypercalcemia, EtOH w/d and medication overuse  . Hypertension   . Tobacco abuse   . Fx humeral neck 2008    right side, no history of surgical repair  . Encephalopathy   . Parathyroid adenoma   . Angina     mild pain on arm, left chest, moves down left chest  . Allergy     Past Surgical History  Procedure Laterality Date  . Parathyroid adenoma resection  Oct. 8, 2011    residual adenoma Oct. 12, 2011  . Ankle pinning    . Open left proximal ureterolithotomy  09/18/2007  . Cystoscopy, laser cystolithoplaxy  08/16/2007  . Cysto, removal of bladder stone, attempted stent placement  08/15/2007  . Thyroidectomy  03/09/12  . Kidney stone surgery  07/2007 X2    /E-chart  . Parathyroidectomy  03/09/2012    Procedure: PARATHYROIDECTOMY;  Surgeon: Pedro Earls, MD;  Location: [facility];  Service: General;  Laterality: N/A;  mediastinal paraqthyroidectomy     No family history on file.  Social History   Social History  . Marital Status: Single    Spouse Name: N/A  . Number of Children: N/A  . Years of Education: N/A   Occupational History  . disabled    Social History Main Topics  . Smoking status: Current Every Day Smoker -- 1.00 packs/day for 50 years    Types: Cigarettes  . Smokeless tobacco: Never Used  . Alcohol Use: Yes     Comment: 03/09/12 "last alcohol 1983"; history of heavy alcohol use  . Drug Use: Yes    Special: Marijuana     Comment: 03/09/12 Last drug use "~1978"  . Sexual Activity: No   Other Topics Concern  . Not on file   Social History Narrative   Patient is disabled. Patient lives with brother currently. Patient has history of being in prison.   History of alcoholism    No Known Allergies   Review of Systems  Constitutional: Negative for fever, chills, weight loss, malaise/fatigue and fatigue.  HENT: Negative for ear discharge, ear pain and sore throat.   Eyes: Negative for blurred vision.  Respiratory: Negative for cough,  sputum production, shortness of breath and wheezing.   Cardiovascular: Negative for chest pain, palpitations and leg swelling.  Gastrointestinal: Positive for hematochezia. Negative for heartburn, nausea, abdominal pain, diarrhea, constipation, blood in stool and melena.  Genitourinary: Positive for frequency and nocturia. Negative for dysuria, urgency and hematuria.  Musculoskeletal: Negative for myalgias, back pain, joint pain and neck pain.  Skin: Negative for rash.  Neurological: Negative for dizziness, tingling, sensory change, focal weakness, weakness, light-headedness and headaches.  Endo/Heme/Allergies: Negative for environmental allergies, polydipsia and polyphagia. Does not bruise/bleed easily.  Psychiatric/Behavioral: Negative for depression and suicidal ideas. The patient has insomnia. The patient is not nervous/anxious.      Objective  Filed Vitals:   02/22/16 1551  BP: 130/80  Pulse: 78  Height: 5\' 9"  (1.753 m)  Weight: 163 lb (73.936 kg)    Physical Exam  Constitutional: He is oriented to person, place, and time and well-developed, well-nourished, and in no distress.  HENT:  Head: Normocephalic.  Right Ear: External ear normal.  Left Ear: External ear normal.  Nose: Nose normal.  Mouth/Throat: Oropharynx is clear and moist.  Eyes: Conjunctivae and EOM are normal. Pupils are equal, round, and reactive to light. Right eye  exhibits no discharge. Left eye exhibits no discharge. No scleral icterus.  Neck: Normal range of motion. Neck supple. No JVD present. No tracheal deviation present. No thyromegaly present.  Cardiovascular: Normal rate, regular rhythm, normal heart sounds and intact distal pulses.  Exam reveals no gallop and no friction rub.   No murmur heard. Pulmonary/Chest: Breath sounds normal. No respiratory distress. He has no wheezes. He has no rales.  Abdominal: Soft. Bowel sounds are normal. He exhibits no mass. There is no hepatosplenomegaly. There is no  tenderness. There is no rebound, no guarding and no CVA tenderness.  Musculoskeletal: Normal range of motion. He exhibits no edema or tenderness.  Lymphadenopathy:    He has no cervical adenopathy.  Neurological: He is alert and oriented to person, place, and time. He has normal sensation, normal strength and intact cranial nerves. No cranial nerve deficit.  Skin: Skin is warm. No rash noted.  Psychiatric: Mood and affect normal.  Nursing note and vitals reviewed.     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 (Chronic)    Other Visit Diagnoses    BPH (benign prostatic hyperplasia)        Relevant Medications    tamsulosin (FLOMAX) 0.4 MG CAPS capsule    Other Relevant Orders    PSA    Prediabetes        Relevant Medications    glucose blood test strip    TRUEPLUS LANCETS 26G MISC    Allergic rhinitis due to pollen        Relevant Medications    fluticasone (FLONASE) 50 MCG/ACT nasal spray    Folic acid deficiency        Relevant Medications    folic acid (FOLVITE) 1 MG tablet    Insomnia        Relevant Medications    Melatonin 1 MG TABS    Thiamine deficiency        Relevant Medications    thiamine (VITAMIN B-1) 100 MG tablet    Chronic constipation        Relevant Medications    polyethylene glycol powder (GLYCOLAX/MIRALAX) powder    docusate sodium (COLACE) 100 MG capsule    Vitamin D deficiency        Relevant Medications    Vitamin D, Ergocalciferol, (DRISDOL) 50000 units CAPS capsule    Hyperlipidemia        Relevant Medications    amLODipine (NORVASC) 10 MG tablet    labetalol (NORMODYNE) 100 MG tablet    Other Relevant Orders    Lipid Profile     Hearing loss, bilateral        Relevant Orders    Ambulatory referral to ENT    Encounter to establish care with new doctor             Dr. Otilio Miu [facility]  02/22/2016

## 2016-02-23 ENCOUNTER — Other Ambulatory Visit: Payer: Self-pay

## 2016-02-23 DIAGNOSIS — R7989 Other specified abnormal findings of blood chemistry: Secondary | ICD-10-CM

## 2016-02-23 LAB — RENAL FUNCTION PANEL
Albumin: 4.3 g/dL (ref 3.6–4.8)
BUN / CREAT RATIO: 10 (ref 10–22)
BUN: 29 mg/dL — ABNORMAL HIGH (ref 8–27)
CALCIUM: 12.1 mg/dL — AB (ref 8.6–10.2)
CHLORIDE: 105 mmol/L (ref 96–106)
CO2: 19 mmol/L (ref 18–29)
Creatinine, Ser: 2.77 mg/dL — ABNORMAL HIGH (ref 0.76–1.27)
GFR calc Af Amer: 26 mL/min/{1.73_m2} — ABNORMAL LOW (ref >59)
GFR calc non Af Amer: 22 mL/min/{1.73_m2} — ABNORMAL LOW (ref >59)
Glucose: 114 mg/dL — ABNORMAL HIGH (ref 65–99)
Phosphorus: 2.7 mg/dL (ref 2.5–4.5)
Potassium: 5.2 mmol/L (ref 3.5–5.2)
SODIUM: 146 mmol/L — AB (ref 134–144)

## 2016-02-23 LAB — PSA: Prostate Specific Ag, Serum: 0.9 ng/mL (ref 0.0–4.0)

## 2016-02-23 LAB — LIPID PANEL
CHOLESTEROL TOTAL: 119 mg/dL (ref 100–199)
Chol/HDL Ratio: 3.1 ratio units (ref 0.0–5.0)
HDL: 38 mg/dL — ABNORMAL LOW (ref >39)
LDL CALC: 44 mg/dL (ref 0–99)
Triglycerides: 183 mg/dL — ABNORMAL HIGH (ref 0–149)
VLDL Cholesterol Cal: 37 mg/dL (ref 5–40)

## 2016-05-26 ENCOUNTER — Other Ambulatory Visit: Payer: Self-pay

## 2016-07-26 ENCOUNTER — Other Ambulatory Visit: Payer: Self-pay

## 2016-08-11 ENCOUNTER — Other Ambulatory Visit: Payer: Self-pay | Admitting: Family Medicine

## 2016-08-11 DIAGNOSIS — I10 Essential (primary) hypertension: Secondary | ICD-10-CM

## 2016-09-12 ENCOUNTER — Other Ambulatory Visit: Payer: Self-pay | Admitting: Family Medicine

## 2016-09-12 DIAGNOSIS — I10 Essential (primary) hypertension: Secondary | ICD-10-CM

## 2016-09-12 DIAGNOSIS — J301 Allergic rhinitis due to pollen: Secondary | ICD-10-CM

## 2016-09-19 ENCOUNTER — Other Ambulatory Visit: Payer: Self-pay | Admitting: Family Medicine

## 2016-09-29 ENCOUNTER — Ambulatory Visit: Payer: Medicare Other | Admitting: Family Medicine

## 2016-10-05 ENCOUNTER — Ambulatory Visit (INDEPENDENT_AMBULATORY_CARE_PROVIDER_SITE_OTHER): Payer: Medicare Other | Admitting: Family Medicine

## 2016-10-05 ENCOUNTER — Encounter: Payer: Self-pay | Admitting: Family Medicine

## 2016-10-05 VITALS — BP 124/80 | HR 78 | Ht 69.0 in | Wt 159.0 lb

## 2016-10-05 DIAGNOSIS — E119 Type 2 diabetes mellitus without complications: Secondary | ICD-10-CM | POA: Diagnosis not present

## 2016-10-05 DIAGNOSIS — R7303 Prediabetes: Secondary | ICD-10-CM | POA: Diagnosis not present

## 2016-10-05 DIAGNOSIS — E782 Mixed hyperlipidemia: Secondary | ICD-10-CM

## 2016-10-05 DIAGNOSIS — N184 Chronic kidney disease, stage 4 (severe): Secondary | ICD-10-CM

## 2016-10-05 DIAGNOSIS — N400 Enlarged prostate without lower urinary tract symptoms: Secondary | ICD-10-CM

## 2016-10-05 DIAGNOSIS — Z23 Encounter for immunization: Secondary | ICD-10-CM

## 2016-10-05 DIAGNOSIS — Z87448 Personal history of other diseases of urinary system: Secondary | ICD-10-CM

## 2016-10-05 DIAGNOSIS — J301 Allergic rhinitis due to pollen: Secondary | ICD-10-CM | POA: Diagnosis not present

## 2016-10-05 DIAGNOSIS — I10 Essential (primary) hypertension: Secondary | ICD-10-CM | POA: Diagnosis not present

## 2016-10-05 DIAGNOSIS — E519 Thiamine deficiency, unspecified: Secondary | ICD-10-CM | POA: Diagnosis not present

## 2016-10-05 DIAGNOSIS — Z862 Personal history of diseases of the blood and blood-forming organs and certain disorders involving the immune mechanism: Secondary | ICD-10-CM

## 2016-10-05 DIAGNOSIS — F5101 Primary insomnia: Secondary | ICD-10-CM | POA: Diagnosis not present

## 2016-10-05 DIAGNOSIS — E538 Deficiency of other specified B group vitamins: Secondary | ICD-10-CM

## 2016-10-05 DIAGNOSIS — K5909 Other constipation: Secondary | ICD-10-CM | POA: Diagnosis not present

## 2016-10-05 MED ORDER — TRUEPLUS LANCETS 26G MISC: 1.0000 | Freq: Two times a day (BID) | 5 refills | Status: AC

## 2016-10-05 MED ORDER — DOCUSATE SODIUM 100 MG PO CAPS: 100.0000 mg | ORAL_CAPSULE | Freq: Every day | ORAL | 5 refills | Status: DC

## 2016-10-05 MED ORDER — AMLODIPINE BESYLATE 10 MG PO TABS: 10.0000 mg | ORAL_TABLET | Freq: Every day | ORAL | 6 refills | Status: DC

## 2016-10-05 MED ORDER — ATORVASTATIN CALCIUM 20 MG PO TABS: 20.0000 mg | ORAL_TABLET | Freq: Every day | ORAL | 6 refills | Status: DC

## 2016-10-05 MED ORDER — VITAMIN B-1 100 MG PO TABS: 100.0000 mg | ORAL_TABLET | Freq: Every day | ORAL | 5 refills | Status: DC

## 2016-10-05 MED ORDER — TAMSULOSIN HCL 0.4 MG PO CAPS: 0.4000 mg | ORAL_CAPSULE | Freq: Every day | ORAL | 5 refills | Status: DC

## 2016-10-05 MED ORDER — FOLIC ACID 1 MG PO TABS: 1.0000 mg | ORAL_TABLET | Freq: Every day | ORAL | 5 refills | Status: DC

## 2016-10-05 MED ORDER — FLUTICASONE PROPIONATE 50 MCG/ACT NA SUSP: NASAL | 11 refills | Status: DC

## 2016-10-05 MED ORDER — GLIMEPIRIDE 2 MG PO TABS: 2.0000 mg | ORAL_TABLET | Freq: Two times a day (BID) | ORAL | 11 refills | Status: DC

## 2016-10-05 MED ORDER — LABETALOL HCL 100 MG PO TABS: 100.0000 mg | ORAL_TABLET | Freq: Two times a day (BID) | ORAL | 6 refills | Status: DC

## 2016-10-05 MED ORDER — MELATONIN 1 MG PO TABS: 1.0000 | ORAL_TABLET | Freq: Every day | ORAL | 6 refills | Status: DC

## 2016-10-05 MED ORDER — METOPROLOL SUCCINATE ER 25 MG PO TB24: 25.0000 mg | ORAL_TABLET | Freq: Every day | ORAL | 6 refills | Status: DC

## 2016-10-05 MED ORDER — GLUCOSE BLOOD VI STRP: 1.0000 | ORAL_STRIP | Freq: Two times a day (BID) | 5 refills | Status: AC

## 2016-10-05 NOTE — Progress Notes (Signed)
Name: [name]   MRN: [id number]    DOB: 1946/05/26   Date:10/05/2016       Progress Note  Subjective  Chief Complaint  Chief Complaint  Patient presents with  . Diabetes  . Hypertension  . Benign Prostatic Hypertrophy  . Hyperlipidemia  . Allergic Rhinitis   . Anemia  . VITAMIN b 1 deficiency    Diabetes  He presents for his follow-up diabetic visit. He has type 2 diabetes mellitus. His disease course has been stable. Pertinent negatives for hypoglycemia include no confusion, dizziness, headaches, hunger, mood changes, nervousness/anxiousness, pallor, seizures, sleepiness, speech difficulty, sweats or tremors. Pertinent negatives for diabetes include no blurred vision, no chest pain, no fatigue, no foot paresthesias, no foot ulcerations, no polydipsia, no polyphagia, no polyuria, no visual change, no weakness and no weight loss. There are no hypoglycemic complications. Symptoms are stable. Pertinent negatives for diabetic complications include no CVA, PVD or retinopathy. Risk factors for coronary artery disease include dyslipidemia, male sex, hypertension and tobacco exposure. Current diabetic treatment includes oral agent (monotherapy). He is compliant with treatment all of the time. His weight is stable. He has not had a previous visit with a dietitian. He participates in exercise intermittently. His home blood glucose trend is fluctuating minimally. His breakfast blood glucose is taken between 8-9 am. His breakfast blood glucose range is generally 110-130 mg/dl.  Hypertension  This is a chronic problem. The current episode started more than 1 year ago. Pertinent negatives include no blurred vision, chest pain, headaches, malaise/fatigue, neck pain, palpitations, peripheral edema, PND, shortness of breath or sweats. There are no associated agents to hypertension. There are no known risk factors for coronary artery disease. Past treatments include calcium channel blockers and beta  blockers. The current treatment provides moderate improvement. There are no compliance problems.  There is no history of angina, kidney disease, CAD/MI, CVA, heart failure, left ventricular hypertrophy, PVD, renovascular disease or retinopathy. There is no history of chronic renal disease or a hypertension causing med.  Benign Prostatic Hypertrophy  This is a chronic problem. The current episode started more than 1 year ago. The problem has been gradually improving since onset. Irritative symptoms do not include frequency or urgency. Pertinent negatives include no chills, dysuria, hematuria or nausea. Nothing aggravates the symptoms. Past treatments include nothing. The treatment provided mild relief.  Hyperlipidemia  This is a chronic problem. The problem is controlled. Recent lipid tests were reviewed and are normal. He has no history of chronic renal disease. There are no known factors aggravating his hyperlipidemia. Pertinent negatives include no chest pain, focal weakness, myalgias or shortness of breath. Current antihyperlipidemic treatment includes statins. The current treatment provides no improvement of lipids.  Anemia  Presents for follow-up visit. There has been no abdominal pain, anorexia, bruising/bleeding easily, confusion, fever, leg swelling, light-headedness, malaise/fatigue, pallor, palpitations or weight loss. Signs of blood loss that are not present include hematemesis, hematochezia and melena. There is no history of chronic renal disease or heart failure. There are no compliance problems.     No problem-specific Assessment & Plan notes found for this encounter.   Past Medical History:  Diagnosis Date  . Alcoholism ([place])   . Allergy   . Altered mental status 2011   hx of 2/2 hypercalcemia, EtOH w/d and medication overuse  . Angina    mild pain on arm, left chest, moves down left chest  . Chronic renal insufficiency    with history of acute on chronic  secondary to dehydration  and hypercalcemia  . Encephalopathy   . Fx humeral neck 2008   right side, no history of surgical repair  . History of nephrolithiasis 2008   s/p ureterolithotomy   . Hypercalcemia    2/2 primary hyperparathyroidism  . Hypertension   . Pancreatitis 2011   history of several admissions for pancreatitis   . Parathyroid adenoma   . Primary hyperparathyroidism ([place])    s/p parathyroid adenoma resection Oct. 8, 2011, repeat scan showed residual right adenoma, however pt did not f/u with surgery as outpt  . Schizophrenia ([place])    previously on zyprexa  . Tobacco abuse     Past Surgical History:  Procedure Laterality Date  . Ankle Pinning    . cysto, removal of bladder stone, attempted stent placement  08/15/2007  . cystoscopy, laser cystolithoplaxy  08/16/2007  . KIDNEY STONE SURGERY  07/2007 X2   /E-chart  . open left proximal ureterolithotomy  09/18/2007  . parathyroid adenoma resection  Oct. 8, 2011   residual adenoma Oct. 12, 2011  . PARATHYROIDECTOMY  03/09/2012   Procedure: PARATHYROIDECTOMY;  Surgeon: Pedro Earls, MD;  Location: [facility];  Service: General;  Laterality: N/A;  mediastinal paraqthyroidectomy   . THYROIDECTOMY  03/09/12    History reviewed. No pertinent family history.  Social History   Social History  . Marital status: Single    Spouse name: N/A  . Number of children: N/A  . Years of education: N/A   Occupational History  . disabled    Social History Main Topics  . Smoking status: Current Every Day Smoker    Packs/day: 1.00    Years: 50.00    Types: Cigarettes  . Smokeless tobacco: Never Used  . Alcohol use Yes     Comment: 03/09/12 "last alcohol 1983"; history of heavy alcohol use  . Drug use:     Types: Marijuana     Comment: 03/09/12 Last drug use "~1978"  . Sexual activity: No   Other Topics Concern  . Not on file   Social History Narrative   Patient is disabled. Patient lives with brother currently. Patient has history of being in  prison.   History of alcoholism    No Known Allergies   Review of Systems  Constitutional: Negative for chills, fatigue, fever, malaise/fatigue and weight loss.  HENT: Negative for ear discharge, ear pain and sore throat.   Eyes: Negative for blurred vision.  Respiratory: Negative for cough, sputum production, shortness of breath and wheezing.   Cardiovascular: Negative for chest pain, palpitations, leg swelling and PND.  Gastrointestinal: Negative for abdominal pain, anorexia, blood in stool, constipation, diarrhea, heartburn, hematemesis, hematochezia, melena and nausea.  Genitourinary: Negative for dysuria, frequency, hematuria and urgency.  Musculoskeletal: Negative for back pain, joint pain, myalgias and neck pain.  Skin: Negative for pallor and rash.  Neurological: Negative for dizziness, tingling, tremors, sensory change, focal weakness, seizures, speech difficulty, weakness, light-headedness and headaches.  Endo/Heme/Allergies: Negative for environmental allergies, polydipsia and polyphagia. Does not bruise/bleed easily.  Psychiatric/Behavioral: Negative for confusion, depression and suicidal ideas. The patient is not nervous/anxious and does not have insomnia.      Objective  Vitals:   10/05/16 1038  BP: 124/80  Pulse: 78  Weight: 159 lb (72.1 kg)  Height: 5\' 9"  (1.753 m)    Physical Exam  Constitutional: He is oriented to person, place, and time and well-developed, well-nourished, and in no distress.  HENT:  Head: Normocephalic.  Right Ear:  External ear normal.  Left Ear: External ear normal.  Nose: Nose normal.  Mouth/Throat: Oropharynx is clear and moist.  Eyes: Conjunctivae and EOM are normal. Pupils are equal, round, and reactive to light. Right eye exhibits no discharge. Left eye exhibits no discharge. No scleral icterus.  Neck: Normal range of motion. Neck supple. No JVD present. No tracheal deviation present. No thyromegaly present.  Cardiovascular: Normal  rate, regular rhythm, normal heart sounds and intact distal pulses.  Exam reveals no gallop and no friction rub.   No murmur heard. Pulses:      Dorsalis pedis pulses are 1+ on the right side, and 1+ on the left side.       Posterior tibial pulses are 1+ on the right side, and 1+ on the left side.  Pulmonary/Chest: Breath sounds normal. No respiratory distress. He has no wheezes. He has no rales.  Abdominal: Soft. Bowel sounds are normal. He exhibits no mass. There is no hepatosplenomegaly. There is no tenderness. There is no rebound, no guarding and no CVA tenderness.  Musculoskeletal: Normal range of motion. He exhibits no edema or tenderness.  Lymphadenopathy:    He has no cervical adenopathy.  Neurological: He is alert and oriented to person, place, and time. He has normal motor skills, normal sensation, normal strength, normal reflexes and intact cranial nerves. No cranial nerve deficit.  Monofilament normal  Skin: Skin is warm. No rash noted.  Psychiatric: Mood and affect normal.  Nursing note and vitals reviewed.     Assessment & Plan  Problem List Items Addressed This Visit      Cardiovascular and Mediastinum   Hypertension - Primary   Relevant Medications   amLODipine (NORVASC) 10 MG tablet   labetalol (NORMODYNE) 100 MG tablet   atorvastatin (LIPITOR) 20 MG tablet   metoprolol succinate (TOPROL-XL) 25 MG 24 hr tablet   Other Relevant Orders   Renal Function Panel     Genitourinary   CKD (chronic kidney disease) stage 4, GFR 15-29 ml/min (HCC) (Chronic)    Other Visit Diagnoses    Type 2 diabetes mellitus without complication, without long-term current use of insulin (HCC)       Relevant Medications   atorvastatin (LIPITOR) 20 MG tablet   glimepiride (AMARYL) 2 MG tablet   Other Relevant Orders   Hemoglobin A1c   Renal Function Panel   Microalbumin / creatinine urine ratio   Mixed hyperlipidemia       Relevant Medications   amLODipine (NORVASC) 10 MG tablet    labetalol (NORMODYNE) 100 MG tablet   atorvastatin (LIPITOR) 20 MG tablet   metoprolol succinate (TOPROL-XL) 25 MG 24 hr tablet   Other Relevant Orders   Lipid Profile   Hx of anemia of chronic renal failure       Benign prostatic hyperplasia without lower urinary tract symptoms       Relevant Medications   tamsulosin (FLOMAX) 0.4 MG CAPS capsule   Other Relevant Orders   PSA   B12 deficiency       Chronic seasonal allergic rhinitis due to pollen       Relevant Medications   fluticasone (FLONASE) 50 MCG/ACT nasal spray   Chronic constipation       Relevant Medications   docusate sodium (COLACE) 683 MG capsule   Folic acid deficiency       Relevant Medications   folic acid (FOLVITE) 1 MG tablet   Primary insomnia       Relevant Medications  Melatonin 1 MG TABS   Prediabetes       Relevant Medications   glucose blood test strip   TRUEPLUS LANCETS 26G MISC   Other Relevant Orders   Hemoglobin A1c   Microalbumin / creatinine urine ratio   Thiamine deficiency       Relevant Medications   thiamine (VITAMIN B-1) 100 MG tablet    I spent 25 minutes with this patient, More than 50% of that time was spent in face to face education, counseling and care coordination.    Dr. [facility]  10/05/16

## 2016-10-06 DIAGNOSIS — Z23 Encounter for immunization: Secondary | ICD-10-CM | POA: Diagnosis not present

## 2016-10-06 NOTE — Addendum Note (Signed)
Addended by: Otilio Miu C on: 10/06/2016 11:28 AM   Modules accepted: Orders

## 2016-10-07 ENCOUNTER — Other Ambulatory Visit: Payer: Self-pay

## 2016-10-07 DIAGNOSIS — R7989 Other specified abnormal findings of blood chemistry: Secondary | ICD-10-CM

## 2016-10-07 LAB — MICROALBUMIN / CREATININE URINE RATIO
Creatinine, Urine: 76.9 mg/dL
Microalb/Creat Ratio: 34.6 mg/g creat — ABNORMAL HIGH (ref 0.0–30.0)
Microalbumin, Urine: 26.6 ug/mL

## 2016-10-07 LAB — RENAL FUNCTION PANEL
ALBUMIN: 4.2 g/dL (ref 3.6–4.8)
BUN/Creatinine Ratio: 11 (ref 10–24)
BUN: 30 mg/dL — ABNORMAL HIGH (ref 8–27)
CALCIUM: 11.4 mg/dL — AB (ref 8.6–10.2)
CO2: 20 mmol/L (ref 18–29)
CREATININE: 2.67 mg/dL — AB (ref 0.76–1.27)
Chloride: 107 mmol/L — ABNORMAL HIGH (ref 96–106)
GFR calc Af Amer: 27 mL/min/{1.73_m2} — ABNORMAL LOW (ref >59)
GFR, EST NON AFRICAN AMERICAN: 23 mL/min/{1.73_m2} — AB (ref >59)
Glucose: 115 mg/dL — ABNORMAL HIGH (ref 65–99)
PHOSPHORUS: 2.9 mg/dL (ref 2.5–4.5)
Potassium: 5.2 mmol/L (ref 3.5–5.2)
SODIUM: 145 mmol/L — AB (ref 134–144)

## 2016-10-07 LAB — PSA: Prostate Specific Ag, Serum: 0.9 ng/mL (ref 0.0–4.0)

## 2016-10-07 LAB — LIPID PANEL
CHOL/HDL RATIO: 2.9 ratio (ref 0.0–5.0)
CHOLESTEROL TOTAL: 112 mg/dL (ref 100–199)
HDL: 39 mg/dL — AB (ref >39)
LDL CALC: 49 mg/dL (ref 0–99)
TRIGLYCERIDES: 119 mg/dL (ref 0–149)
VLDL Cholesterol Cal: 24 mg/dL (ref 5–40)

## 2016-10-07 LAB — HEMOGLOBIN A1C
Est. average glucose Bld gHb Est-mCnc: 120 mg/dL
Hgb A1c MFr Bld: 5.8 % — ABNORMAL HIGH (ref 4.8–5.6)

## 2016-11-15 ENCOUNTER — Other Ambulatory Visit: Payer: Self-pay | Admitting: Family Medicine

## 2017-01-19 ENCOUNTER — Other Ambulatory Visit: Payer: Self-pay | Admitting: Family Medicine

## 2017-01-20 ENCOUNTER — Other Ambulatory Visit: Payer: Self-pay

## 2017-02-09 ENCOUNTER — Other Ambulatory Visit: Payer: Self-pay

## 2017-02-28 ENCOUNTER — Other Ambulatory Visit: Payer: Self-pay

## 2017-03-02 ENCOUNTER — Encounter: Payer: Self-pay | Admitting: Family Medicine

## 2017-03-02 ENCOUNTER — Ambulatory Visit (INDEPENDENT_AMBULATORY_CARE_PROVIDER_SITE_OTHER): Payer: Medicare Other | Admitting: Family Medicine

## 2017-03-02 VITALS — BP 136/80 | HR 80 | Ht 69.0 in | Wt 161.0 lb

## 2017-03-02 DIAGNOSIS — E538 Deficiency of other specified B group vitamins: Secondary | ICD-10-CM | POA: Diagnosis not present

## 2017-03-02 DIAGNOSIS — I10 Essential (primary) hypertension: Secondary | ICD-10-CM

## 2017-03-02 DIAGNOSIS — J301 Allergic rhinitis due to pollen: Secondary | ICD-10-CM

## 2017-03-02 DIAGNOSIS — E519 Thiamine deficiency, unspecified: Secondary | ICD-10-CM | POA: Insufficient documentation

## 2017-03-02 DIAGNOSIS — Z593 Problems related to living in residential institution: Secondary | ICD-10-CM

## 2017-03-02 DIAGNOSIS — K5909 Other constipation: Secondary | ICD-10-CM | POA: Diagnosis not present

## 2017-03-02 DIAGNOSIS — D649 Anemia, unspecified: Secondary | ICD-10-CM | POA: Diagnosis not present

## 2017-03-02 DIAGNOSIS — E559 Vitamin D deficiency, unspecified: Secondary | ICD-10-CM

## 2017-03-02 DIAGNOSIS — E119 Type 2 diabetes mellitus without complications: Secondary | ICD-10-CM | POA: Insufficient documentation

## 2017-03-02 DIAGNOSIS — E785 Hyperlipidemia, unspecified: Secondary | ICD-10-CM | POA: Diagnosis not present

## 2017-03-02 MED ORDER — FLUTICASONE PROPIONATE 50 MCG/ACT NA SUSP: NASAL | 11 refills | Status: AC

## 2017-03-02 MED ORDER — METOPROLOL SUCCINATE ER 25 MG PO TB24: 25.0000 mg | ORAL_TABLET | Freq: Every day | ORAL | 6 refills | Status: DC

## 2017-03-02 MED ORDER — DOCUSATE SODIUM 100 MG PO CAPS: 100.0000 mg | ORAL_CAPSULE | Freq: Two times a day (BID) | ORAL | 6 refills | Status: DC

## 2017-03-02 MED ORDER — GLIMEPIRIDE 2 MG PO TABS: 2.0000 mg | ORAL_TABLET | Freq: Two times a day (BID) | ORAL | 6 refills | Status: DC

## 2017-03-02 MED ORDER — LABETALOL HCL 100 MG PO TABS: 100.0000 mg | ORAL_TABLET | Freq: Two times a day (BID) | ORAL | 6 refills | Status: DC

## 2017-03-02 MED ORDER — VITAMIN B-1 100 MG PO TABS: 100.0000 mg | ORAL_TABLET | Freq: Every day | ORAL | 6 refills | Status: AC

## 2017-03-02 MED ORDER — FOLIC ACID 1 MG PO TABS: 1.0000 mg | ORAL_TABLET | Freq: Every day | ORAL | 6 refills | Status: AC

## 2017-03-02 MED ORDER — AMLODIPINE BESYLATE 10 MG PO TABS: 10.0000 mg | ORAL_TABLET | Freq: Every day | ORAL | 6 refills | Status: DC

## 2017-03-02 MED ORDER — ERGOCALCIFEROL 1.25 MG (50000 UT) PO CAPS: 50000.0000 [IU] | ORAL_CAPSULE | ORAL | 6 refills | Status: DC

## 2017-03-02 MED ORDER — ATORVASTATIN CALCIUM 20 MG PO TABS: 20.0000 mg | ORAL_TABLET | Freq: Every day | ORAL | 6 refills | Status: AC

## 2017-03-02 NOTE — Progress Notes (Signed)
Name: [name]   MRN: [id number]    DOB: 1946/03/18   Date:03/02/2017       Progress Note  Subjective  Chief Complaint  Chief Complaint  Patient presents with  . Hypertension  . vitamin d deficiency  . Constipation    increase to BID  . Anemia  . Hyperlipidemia  . Diabetes  . Allergic Rhinitis     Hypertension  This is a chronic problem. The current episode started more than 1 year ago. The problem has been waxing and waning since onset. The problem is controlled. Pertinent negatives include no anxiety, blurred vision, chest pain, headaches, malaise/fatigue, neck pain, orthopnea, palpitations, peripheral edema, PND, shortness of breath or sweats. There are no associated agents to hypertension. Risk factors for coronary artery disease include diabetes mellitus, male gender and dyslipidemia. Past treatments include beta blockers and calcium channel blockers. The current treatment provides moderate improvement. There are no compliance problems.  There is no history of angina, kidney disease, CAD/MI, CVA, heart failure, left ventricular hypertrophy, PVD or retinopathy. There is no history of chronic renal disease, a hypertension causing med or renovascular disease.  Constipation  This is a chronic problem. The current episode started more than 1 year ago. The problem has been waxing and waning since onset. The patient is not on a high fiber diet. There has not been adequate water intake. Pertinent negatives include no abdominal pain, anorexia, back pain, diarrhea, fever, hematochezia, melena, nausea or weight loss.  Anemia  Presents for follow-up visit. There has been no abdominal pain, anorexia, bruising/bleeding easily, confusion, fever, leg swelling, light-headedness, malaise/fatigue, pallor, palpitations or weight loss. Signs of blood loss that are not present include hematemesis, hematochezia and melena. There is no history of chronic renal disease, heart failure or hypothyroidism.  There are no compliance problems.   Hyperlipidemia  This is a chronic problem. The problem is controlled. Recent lipid tests were reviewed and are normal. Exacerbating diseases include diabetes. He has no history of chronic renal disease, hypothyroidism or liver disease. Pertinent negatives include no chest pain, focal weakness, myalgias or shortness of breath. Current antihyperlipidemic treatment includes statins. There are no compliance problems.  Risk factors for coronary artery disease include diabetes mellitus, hypertension, post-menopausal, male sex and dyslipidemia.  Diabetes  He presents for his follow-up diabetic visit. He has type 2 diabetes mellitus. His disease course has been stable. Pertinent negatives for hypoglycemia include no confusion, dizziness, headaches, hunger, mood changes, nervousness/anxiousness, pallor, seizures, sleepiness, speech difficulty, sweats or tremors. Pertinent negatives for diabetes include no blurred vision, no chest pain, no fatigue, no foot paresthesias, no foot ulcerations, no polydipsia, no polyphagia, no polyuria, no visual change, no weakness and no weight loss. Symptoms are stable. Pertinent negatives for diabetic complications include no autonomic neuropathy, CVA, heart disease, impotence, nephropathy, peripheral neuropathy, PVD or retinopathy. Current diabetic treatment includes oral agent (monotherapy). His weight is stable. He participates in exercise daily. His breakfast blood glucose is taken between 8-9 am. His breakfast blood glucose range is generally 110-130 mg/dl. An ACE inhibitor/angiotensin II receptor blocker is not being taken.    No problem-specific Assessment & Plan notes found for this encounter.   Past Medical History:  Diagnosis Date  . Alcoholism ([place])   . Allergy   . Altered mental status 2011   hx of 2/2 hypercalcemia, EtOH w/d and medication overuse  . Angina    mild pain on arm, left chest, moves down left chest  . Chronic  renal  insufficiency    with history of acute on chronic secondary to dehydration and hypercalcemia  . Encephalopathy   . Fx humeral neck 2008   right side, no history of surgical repair  . History of nephrolithiasis 2008   s/p ureterolithotomy   . Hypercalcemia    2/2 primary hyperparathyroidism  . Hypertension   . Pancreatitis 2011   history of several admissions for pancreatitis   . Parathyroid adenoma   . Primary hyperparathyroidism ([place])    s/p parathyroid adenoma resection Oct. 8, 2011, repeat scan showed residual right adenoma, however pt did not f/u with surgery as outpt  . Schizophrenia ([place])    previously on zyprexa  . Tobacco abuse     Past Surgical History:  Procedure Laterality Date  . Ankle Pinning    . cysto, removal of bladder stone, attempted stent placement  08/15/2007  . cystoscopy, laser cystolithoplaxy  08/16/2007  . KIDNEY STONE SURGERY  07/2007 X2   /E-chart  . open left proximal ureterolithotomy  09/18/2007  . parathyroid adenoma resection  Oct. 8, 2011   residual adenoma Oct. 12, 2011  . PARATHYROIDECTOMY  03/09/2012   Procedure: PARATHYROIDECTOMY;  Surgeon: Pedro Earls, MD;  Location: [facility];  Service: General;  Laterality: N/A;  mediastinal paraqthyroidectomy   . THYROIDECTOMY  03/09/12    No family history on file.  Social History   Social History  . Marital status: Single    Spouse name: N/A  . Number of children: N/A  . Years of education: N/A   Occupational History  . disabled    Social History Main Topics  . Smoking status: Current Every Day Smoker    Packs/day: 1.00    Years: 50.00    Types: Cigarettes  . Smokeless tobacco: Never Used  . Alcohol use Yes     Comment: 03/09/12 "last alcohol 1983"; history of heavy alcohol use  . Drug use: Yes    Types: Marijuana     Comment: 03/09/12 Last drug use "~1978"  . Sexual activity: No   Other Topics Concern  . Not on file   Social History Narrative   Patient is disabled. Patient  lives with brother currently. Patient has history of being in prison.   History of alcoholism    No Known Allergies  Outpatient Medications Prior to Visit  Medication Sig Dispense Refill  . glucose blood test strip 1 each by Other route 2 (two) times daily. Use as instructed 100 each 5  . Melatonin 1 MG TABS Take 1 tablet (1 mg total) by mouth daily. 30 tablet 6  . Multiple Vitamins-Minerals (MULTIVITAMIN WITH MINERALS) tablet TAKE 1 TABLET BY MOUTH DAILY 30 tablet 3  . OLANZapine (ZYPREXA) 15 MG tablet Take 2 tablets by mouth at bedtime.    . polyethylene glycol powder (GLYCOLAX/MIRALAX) powder MIX 17 GRAMS (1 CAPFUL) IN 8 OUNCES WATER & DRINK DAILY 527 g 0  . tamsulosin (FLOMAX) 0.4 MG CAPS capsule Take 1 capsule (0.4 mg total) by mouth daily. In the am 30 capsule 5  . TRUEPLUS LANCETS 26G MISC 1 each by Does not apply route 2 (two) times daily. 100 each 5  . amLODipine (NORVASC) 10 MG tablet Take 1 tablet (10 mg total) by mouth daily. 30 tablet 6  . atorvastatin (LIPITOR) 20 MG tablet Take 1 tablet (20 mg total) by mouth daily. 30 tablet 6  . docusate sodium (COLACE) 100 MG capsule Take 1 capsule (100 mg total) by mouth daily. 30 capsule 5  .  fluticasone (FLONASE) 50 MCG/ACT nasal spray USE 2 SPRAYS IN EACH NOSTRIL DAILY AS NEEDED 16 g 11  . folic acid (FOLVITE) 1 MG tablet Take 1 tablet (1 mg total) by mouth daily. 30 tablet 5  . glimepiride (AMARYL) 2 MG tablet Take 1 tablet (2 mg total) by mouth 2 (two) times daily. 60 tablet 11  . labetalol (NORMODYNE) 100 MG tablet Take 1 tablet (100 mg total) by mouth 2 (two) times daily. 60 tablet 6  . metoprolol succinate (TOPROL-XL) 25 MG 24 hr tablet Take 1 tablet (25 mg total) by mouth daily. 30 tablet 6  . thiamine (VITAMIN B-1) 100 MG tablet Take 1 tablet (100 mg total) by mouth daily. 30 tablet 5  . polyvinyl alcohol (ARTIFICIAL TEARS) 1.4 % ophthalmic solution Place 1 drop into both eyes QID.     No facility-administered medications prior  to visit.     Review of Systems  Constitutional: Negative for chills, fatigue, fever, malaise/fatigue and weight loss.  HENT: Negative for ear discharge, ear pain and sore throat.   Eyes: Negative for blurred vision.  Respiratory: Negative for cough, sputum production, shortness of breath and wheezing.   Cardiovascular: Negative for chest pain, palpitations, orthopnea, leg swelling and PND.  Gastrointestinal: Positive for constipation. Negative for abdominal pain, anorexia, blood in stool, diarrhea, heartburn, hematemesis, hematochezia, melena and nausea.  Genitourinary: Negative for dysuria, frequency, hematuria, impotence and urgency.  Musculoskeletal: Negative for back pain, joint pain, myalgias and neck pain.  Skin: Negative for pallor and rash.  Neurological: Negative for dizziness, tingling, tremors, sensory change, focal weakness, seizures, speech difficulty, weakness, light-headedness and headaches.  Endo/Heme/Allergies: Negative for environmental allergies, polydipsia and polyphagia. Does not bruise/bleed easily.  Psychiatric/Behavioral: Negative for confusion, depression and suicidal ideas. The patient is not nervous/anxious and does not have insomnia.      Objective  Vitals:   03/02/17 1050  BP: 136/80  Pulse: 80  Weight: 161 lb (73 kg)  Height: 5\' 9"  (1.753 m)    Physical Exam  Constitutional: He is oriented to person, place, and time and well-developed, well-nourished, and in no distress.  HENT:  Head: Normocephalic.  Right Ear: External ear normal.  Left Ear: External ear normal.  Nose: Nose normal.  Mouth/Throat: Oropharynx is clear and moist.  Eyes: Conjunctivae and EOM are normal. Pupils are equal, round, and reactive to light. Right eye exhibits no discharge. Left eye exhibits no discharge. No scleral icterus.  Neck: Normal range of motion. Neck supple. No JVD present. No tracheal deviation present. No thyromegaly present.  Cardiovascular: Normal rate, regular  rhythm, normal heart sounds and intact distal pulses.  Exam reveals no gallop and no friction rub.   No murmur heard. Pulmonary/Chest: Breath sounds normal. No respiratory distress. He has no wheezes. He has no rales.  Abdominal: Soft. Bowel sounds are normal. He exhibits no mass. There is no hepatosplenomegaly. There is no tenderness. There is no rebound, no guarding and no CVA tenderness.  Musculoskeletal: Normal range of motion. He exhibits no edema or tenderness.  Lymphadenopathy:    He has no cervical adenopathy.  Neurological: He is alert and oriented to person, place, and time. He has normal sensation, normal strength, normal reflexes and intact cranial nerves. No cranial nerve deficit.  Skin: Skin is warm. No rash noted.  Psychiatric: Mood and affect normal.  Nursing note and vitals reviewed.     Assessment & Plan  Problem List Items Addressed This Visit      Cardiovascular  and Mediastinum   Hypertension   Relevant Medications   labetalol (NORMODYNE) 100 MG tablet   amLODipine (NORVASC) 10 MG tablet   atorvastatin (LIPITOR) 20 MG tablet   metoprolol succinate (TOPROL-XL) 25 MG 24 hr tablet   Other Relevant Orders   Renal Function Panel     Respiratory   Chronic seasonal allergic rhinitis due to pollen   Relevant Medications   fluticasone (FLONASE) 50 MCG/ACT nasal spray     Digestive   Chronic constipation   Relevant Medications   docusate sodium (COLACE) 100 MG capsule     Endocrine   Type 2 diabetes mellitus without complication, without long-term current use of insulin (HCC) - Primary   Relevant Medications   glimepiride (AMARYL) 2 MG tablet   atorvastatin (LIPITOR) 20 MG tablet   Other Relevant Orders   Renal Function Panel   Hemoglobin P3X     Other   Folic acid deficiency   Relevant Medications   folic acid (FOLVITE) 1 MG tablet   Thiamine deficiency   Relevant Medications   thiamine (VITAMIN B-1) 100 MG tablet   Lives in long-term care facility    Hyperlipidemia   Relevant Medications   labetalol (NORMODYNE) 100 MG tablet   amLODipine (NORVASC) 10 MG tablet   atorvastatin (LIPITOR) 20 MG tablet   metoprolol succinate (TOPROL-XL) 25 MG 24 hr tablet   Other Relevant Orders   Lipid Profile   Vitamin D deficiency   Relevant Medications   ergocalciferol (VITAMIN D2) 50000 units capsule   Anemia   Relevant Medications   folic acid (FOLVITE) 1 MG tablet   Other Relevant Orders   CBC w/Diff/Platelet      Meds ordered this encounter  Medications  . labetalol (NORMODYNE) 100 MG tablet    Sig: Take 1 tablet (100 mg total) by mouth 2 (two) times daily.    Dispense:  60 tablet    Refill:  6    Refill Too Soon- needs appt before filling  . amLODipine (NORVASC) 10 MG tablet    Sig: Take 1 tablet (10 mg total) by mouth daily.    Dispense:  30 tablet    Refill:  6    Refill Too Soon- needs appt before filling  . folic acid (FOLVITE) 1 MG tablet    Sig: Take 1 tablet (1 mg total) by mouth daily.    Dispense:  30 tablet    Refill:  6  . docusate sodium (COLACE) 100 MG capsule    Sig: Take 1 capsule (100 mg total) by mouth 2 (two) times daily.    Dispense:  60 capsule    Refill:  6  . thiamine (VITAMIN B-1) 100 MG tablet    Sig: Take 1 tablet (100 mg total) by mouth daily.    Dispense:  30 tablet    Refill:  6  . glimepiride (AMARYL) 2 MG tablet    Sig: Take 1 tablet (2 mg total) by mouth 2 (two) times daily.    Dispense:  60 tablet    Refill:  6    Refill Too Soon- needs appt before filling  . ergocalciferol (VITAMIN D2) 50000 units capsule    Sig: Take 1 capsule (50,000 Units total) by mouth every 6 (six) weeks.    Dispense:  30 capsule    Refill:  6  . atorvastatin (LIPITOR) 20 MG tablet    Sig: Take 1 tablet (20 mg total) by mouth daily.    Dispense:  30 tablet  Refill:  6    Refill Too Soon- needs appt before filling  . metoprolol succinate (TOPROL-XL) 25 MG 24 hr tablet    Sig: Take 1 tablet (25 mg total) by mouth  daily.    Dispense:  30 tablet    Refill:  6    Refill Too Soon- needs appt before filling  . fluticasone (FLONASE) 50 MCG/ACT nasal spray    Sig: USE 2 SPRAYS IN EACH NOSTRIL DAILY AS NEEDED    Dispense:  16 g    Refill:  11    Refill Too Soon- needs appt before filling      Dr. [facility]  03/02/17

## 2017-03-07 ENCOUNTER — Other Ambulatory Visit: Payer: Self-pay

## 2017-03-09 LAB — RENAL FUNCTION PANEL
ALBUMIN: 4.1 g/dL (ref 3.6–4.8)
BUN/Creatinine Ratio: 15 (ref 10–24)
BUN: 45 mg/dL — ABNORMAL HIGH (ref 8–27)
CALCIUM: 11.6 mg/dL — AB (ref 8.6–10.2)
CO2: 21 mmol/L (ref 18–29)
CREATININE: 3.09 mg/dL — AB (ref 0.76–1.27)
Chloride: 98 mmol/L (ref 96–106)
GFR calc Af Amer: 23 mL/min/{1.73_m2} — ABNORMAL LOW (ref >59)
GFR calc non Af Amer: 20 mL/min/{1.73_m2} — ABNORMAL LOW (ref >59)
Glucose: 375 mg/dL — ABNORMAL HIGH (ref 65–99)
PHOSPHORUS: 3.2 mg/dL (ref 2.5–4.5)
Potassium: 5.7 mmol/L — ABNORMAL HIGH (ref 3.5–5.2)
SODIUM: 135 mmol/L (ref 134–144)

## 2017-03-09 LAB — LIPID PANEL
Chol/HDL Ratio: 3.2 ratio (ref 0.0–5.0)
Cholesterol, Total: 133 mg/dL (ref 100–199)
HDL: 41 mg/dL (ref >39)
LDL Calculated: 62 mg/dL (ref 0–99)
Triglycerides: 150 mg/dL — ABNORMAL HIGH (ref 0–149)
VLDL Cholesterol Cal: 30 mg/dL (ref 5–40)

## 2017-03-09 LAB — CBC WITH DIFFERENTIAL/PLATELET
Basophils Absolute: 0 10*3/uL (ref 0.0–0.2)
Basos: 0 %
EOS (ABSOLUTE): 0.1 10*3/uL (ref 0.0–0.4)
EOS: 1 %
HEMOGLOBIN: 13.9 g/dL (ref 13.0–17.7)
Hematocrit: 42.6 % (ref 37.5–51.0)
Immature Grans (Abs): 0 10*3/uL (ref 0.0–0.1)
Immature Granulocytes: 0 %
LYMPHS ABS: 1.6 10*3/uL (ref 0.7–3.1)
Lymphs: 21 %
MCH: 29.9 pg (ref 26.6–33.0)
MCHC: 32.6 g/dL (ref 31.5–35.7)
MCV: 92 fL (ref 79–97)
MONOCYTES: 3 %
Monocytes Absolute: 0.2 10*3/uL (ref 0.1–0.9)
NEUTROS ABS: 5.6 10*3/uL (ref 1.4–7.0)
Neutrophils: 75 %
Platelets: 255 10*3/uL (ref 150–379)
RBC: 4.65 x10E6/uL (ref 4.14–5.80)
RDW: 15 % (ref 12.3–15.4)
WBC: 7.5 10*3/uL (ref 3.4–10.8)

## 2017-03-09 LAB — HEMOGLOBIN A1C
ESTIMATED AVERAGE GLUCOSE: 229 mg/dL
Hgb A1c MFr Bld: 9.6 % — ABNORMAL HIGH (ref 4.8–5.6)

## 2017-03-10 ENCOUNTER — Other Ambulatory Visit: Payer: Self-pay

## 2017-03-10 DIAGNOSIS — E119 Type 2 diabetes mellitus without complications: Secondary | ICD-10-CM

## 2017-03-10 MED ORDER — GLIMEPIRIDE 1 MG PO TABS: 1.0000 mg | ORAL_TABLET | Freq: Every day | ORAL | 0 refills | Status: DC

## 2017-03-12 NOTE — Progress Notes (Deleted)
03/13/2017 2:42 PM   [name] 10-03-1946 [id number]  Referring provider: Murlean Iba, MD [address], [place] [address]  No chief complaint on file.   HPI: Patient is a 71 year old African-American male who is referred by Dr. Murlean Iba for urinary incontinence.  He had been seen by urology in 2008 for an obstructing left proximal ureteral calculus and bladder calculi. He underwent PCNL and cystolithopaxy in 2008.  ***   Noncontrast CT performed 2011 noted bilateral nephrolithiasis. I have independently reviewed the films.  BPH WITH LUTS His IPSS score today is ***, which is *** lower urinary tract symptomatology. He is *** with his quality life due to his urinary symptoms. His PVR is *** mL.  His previous IPSS score was ***.  His previous PVR is *** mL.    His major complaint today ***.  He has had these symptoms for *** years.  He denies any dysuria, hematuria or suprapubic pain.   He currently taking ***.  His has had ***.  Previous PSA's:     He also denies any recent fevers, chills, nausea or vomiting.  He has a family history of PCa, with ***.   He does not have a family history of PCa.***    Score:  1-7 Mild 8-19 Moderate 20-35 Severe    PMH: Past Medical History:  Diagnosis Date  . Alcoholism ([place])   . Allergy   . Altered mental status 2011   hx of 2/2 hypercalcemia, EtOH w/d and medication overuse  . Angina    mild pain on arm, left chest, moves down left chest  . Chronic renal insufficiency    with history of acute on chronic secondary to dehydration and hypercalcemia  . Encephalopathy   . Fx humeral neck 2008   right side, no history of surgical repair  . History of nephrolithiasis 2008   s/p ureterolithotomy   . Hypercalcemia    2/2 primary hyperparathyroidism  . Hypertension   . Pancreatitis 2011   history of several admissions for pancreatitis   . Parathyroid adenoma   . Primary hyperparathyroidism  ([place])    s/p parathyroid adenoma resection Oct. 8, 2011, repeat scan showed residual right adenoma, however pt did not f/u with surgery as outpt  . Schizophrenia ([place])    previously on zyprexa  . Tobacco abuse     Surgical History: Past Surgical History:  Procedure Laterality Date  . Ankle Pinning    . cysto, removal of bladder stone, attempted stent placement  08/15/2007  . cystoscopy, laser cystolithoplaxy  08/16/2007  . KIDNEY STONE SURGERY  07/2007 X2   /E-chart  . open left proximal ureterolithotomy  09/18/2007  . parathyroid adenoma resection  Oct. 8, 2011   residual adenoma Oct. 12, 2011  . PARATHYROIDECTOMY  03/09/2012   Procedure: PARATHYROIDECTOMY;  Surgeon: Pedro Earls, MD;  Location: [facility];  Service: General;  Laterality: N/A;  mediastinal paraqthyroidectomy   . THYROIDECTOMY  03/09/12    Home Medications:  Allergies as of 03/13/2017   No Known Allergies     Medication List       Accurate as of 03/12/17  2:42 PM. Always use your most recent med list.          amLODipine 10 MG tablet Commonly known as:  NORVASC Take 1 tablet (10 mg total) by mouth daily.   atorvastatin 20 MG tablet Commonly known as:  LIPITOR Take 1 tablet (20 mg total) by mouth daily.  docusate sodium 100 MG capsule Commonly known as:  COLACE Take 1 capsule (100 mg total) by mouth 2 (two) times daily.   ergocalciferol 50000 units capsule Commonly known as:  VITAMIN D2 Take 1 capsule (50,000 Units total) by mouth every 6 (six) weeks.   fluticasone 50 MCG/ACT nasal spray Commonly known as:  FLONASE USE 2 SPRAYS IN EACH NOSTRIL DAILY AS NEEDED   folic acid 1 MG tablet Commonly known as:  FOLVITE Take 1 tablet (1 mg total) by mouth daily.   glimepiride 2 MG tablet Commonly known as:  AMARYL Take 1 tablet (2 mg total) by mouth 2 (two) times daily.   glimepiride 1 MG tablet Commonly known as:  AMARYL Take 1 tablet (1 mg total) by mouth daily with breakfast. Add 1mg  to 2mg = 3mg   BID   glucose blood test strip 1 each by Other route 2 (two) times daily. Use as instructed   labetalol 100 MG tablet Commonly known as:  NORMODYNE Take 1 tablet (100 mg total) by mouth 2 (two) times daily.   Melatonin 1 MG Tabs Take 1 tablet (1 mg total) by mouth daily.   metoprolol succinate 25 MG 24 hr tablet Commonly known as:  TOPROL-XL Take 1 tablet (25 mg total) by mouth daily.   multivitamin with minerals tablet TAKE 1 TABLET BY MOUTH DAILY   OLANZapine 15 MG tablet Commonly known as:  ZYPREXA Take 2 tablets by mouth at bedtime.   polyethylene glycol powder powder Commonly known as:  GLYCOLAX/MIRALAX MIX 17 GRAMS (1 CAPFUL) IN 8 OUNCES WATER & DRINK DAILY   tamsulosin 0.4 MG Caps capsule Commonly known as:  FLOMAX Take 1 capsule (0.4 mg total) by mouth daily. In the am   thiamine 100 MG tablet Commonly known as:  VITAMIN B-1 Take 1 tablet (100 mg total) by mouth daily.   TRUEPLUS LANCETS 26G Misc 1 each by Does not apply route 2 (two) times daily.       Allergies: No Known Allergies  Family History: No family history on file.  Social History:  reports that he has been smoking Cigarettes.  He has a 50.00 pack-year smoking history. He has never used smokeless tobacco. He reports that he drinks alcohol. He reports that he uses drugs, including Marijuana.  ROS:                                        Physical Exam: There were no vitals taken for this visit.  Constitutional: Well nourished. Alert and oriented, No acute distress. HEENT: [place] AT, moist mucus membranes. Trachea midline, no masses. Cardiovascular: No clubbing, cyanosis, or edema. Respiratory: Normal respiratory effort, no increased work of breathing. GI: Abdomen is soft, non tender, non distended, no abdominal masses. Liver and spleen not palpable.  No hernias appreciated.  Stool sample for occult testing is not indicated.   GU: No CVA tenderness.  No bladder fullness or  masses.  Patient with circumcised/uncircumcised phallus. ***Foreskin easily retracted***  Urethral meatus is patent.  No penile discharge. No penile lesions or rashes. Scrotum without lesions, cysts, rashes and/or edema.  Testicles are located scrotally bilaterally. No masses are appreciated in the testicles. Left and right epididymis are normal. Rectal: Patient with  normal sphincter tone. Anus and perineum without scarring or rashes. No rectal masses are appreciated. Prostate is approximately *** grams, *** nodules are appreciated. Seminal vesicles are normal. Skin:  No rashes, bruises or suspicious lesions. Lymph: No cervical or inguinal adenopathy. Neurologic: Grossly intact, no focal deficits, moving all 4 extremities. Psychiatric: Normal mood and affect.  Laboratory Data: Lab Results  Component Value Date   WBC 7.5 03/08/2017   HGB 12.7 (L) 09/10/2013   HCT 42.6 03/08/2017   MCV 92 03/08/2017   PLT 255 03/08/2017    Lab Results  Component Value Date   CREATININE 3.09 (H) 03/08/2017    Lab Results  Component Value Date   PSA 1.79 Test Methodology: Hybritech PSA 08/13/2007    Lab Results  Component Value Date   HGBA1C 9.6 (H) 03/08/2017    Lab Results  Component Value Date   TSH 1.237 11/09/2011       Component Value Date/Time   CHOL 133 03/08/2017 1102   HDL 41 03/08/2017 1102   CHOLHDL 3.2 03/08/2017 1102   CHOLHDL 4.8 08/15/2010 1634   VLDL 28 08/15/2010 1634   LDLCALC 62 03/08/2017 1102    Lab Results  Component Value Date   AST 20 09/10/2013   Lab Results  Component Value Date   ALT 10 09/10/2013     Urinalysis ***  Pertinent Imaging: Clinical Data: Abdominal pain.  Nausea vomiting.  Urolithiasis.   CT ABDOMEN AND PELVIS WITHOUT CONTRAST   Technique:  Multidetector CT imaging of the abdomen and pelvis was performed following the standard protocol without intravenous contrast.   Comparison: 08/20/2007   Findings: Diffuse swelling of the  pancreas is seen with marked peripancreatic inflammatory changes.  Pleural fluid is seen tracking inferiorly in the right anterior pararenal space.  No organized pancreatic pseudocysts are identified.   Cholelithiasis again noted, without evidence of cholecystitis. Gallbladder is contracted.  Noncontrast images of the liver and spleen appear normal.  Bilateral nonobstructing intrarenal calculi are seen, however there is no evidence of ureteral calculi or hydronephrosis.   Moderately enlarged prostate is again seen with mass effect on the bladder base.  No soft tissue masses or lymphadenopathy identified. A small amount of free fluid is seen in the pelvic cul-de-sac. Diverticulosis is seen involving the descending sigmoid colon, however there is no evidence of diverticulitis.   IMPRESSION:   1.  Severe acute pancreatitis with inflammatory change and fluid tracking inferiorly into the lower abdomen.  A small amount of free fluid and pelvic cul-de-sac.  No organized pseudocyst identified. 2.  Cholelithiasis. 3.  Moderately enlarged prostate. 4. Diverticulosis.  No radiographic evidence of diverticulitis. 5.  Bilateral nonobstructing nephrolithiasis.  No evidence of hydronephrosis.  Provider: Beaulah Corin  Assessment & Plan:  ***  History of nephrolithiasis  - Bilateral nephrolithiasis seen on CT in 2011  History of bladder calculus  No Follow-up on file.  These notes generated with voice recognition software. I apologize for typographical errors.  Zara Council, [facility] [address], [place], [place] [address] [phone]

## 2017-03-13 ENCOUNTER — Encounter: Payer: Self-pay | Admitting: Urology

## 2017-03-13 ENCOUNTER — Ambulatory Visit: Payer: Medicare Other | Admitting: Urology

## 2017-03-16 ENCOUNTER — Other Ambulatory Visit: Payer: Self-pay

## 2017-03-21 ENCOUNTER — Other Ambulatory Visit: Payer: Self-pay | Admitting: Family Medicine

## 2017-03-21 DIAGNOSIS — N400 Enlarged prostate without lower urinary tract symptoms: Secondary | ICD-10-CM

## 2017-04-10 NOTE — Progress Notes (Signed)
error 

## 2017-04-11 ENCOUNTER — Telehealth: Payer: Self-pay | Admitting: Family Medicine

## 2017-04-11 ENCOUNTER — Encounter: Payer: Self-pay | Admitting: Urology

## 2017-04-11 ENCOUNTER — Ambulatory Visit
Admission: RE | Admit: 2017-04-11 | Discharge: 2017-04-11 | Disposition: A | Discharge status: Home / Self Care | Payer: Medicare Other | Source: Ambulatory Visit | Attending: Urology | Admitting: Urology

## 2017-04-11 ENCOUNTER — Telehealth: Payer: Self-pay

## 2017-04-11 ENCOUNTER — Ambulatory Visit (INDEPENDENT_AMBULATORY_CARE_PROVIDER_SITE_OTHER): Payer: Medicare Other | Admitting: Urology

## 2017-04-11 VITALS — BP 106/69 | HR 55 | Ht 68.0 in | Wt 150.2 lb

## 2017-04-11 DIAGNOSIS — I700 Atherosclerosis of aorta: Secondary | ICD-10-CM | POA: Insufficient documentation

## 2017-04-11 DIAGNOSIS — R109 Unspecified abdominal pain: Secondary | ICD-10-CM | POA: Diagnosis present

## 2017-04-11 DIAGNOSIS — N138 Other obstructive and reflux uropathy: Secondary | ICD-10-CM

## 2017-04-11 DIAGNOSIS — Z87442 Personal history of urinary calculi: Secondary | ICD-10-CM

## 2017-04-11 DIAGNOSIS — N401 Enlarged prostate with lower urinary tract symptoms: Secondary | ICD-10-CM

## 2017-04-11 DIAGNOSIS — Z87448 Personal history of other diseases of urinary system: Secondary | ICD-10-CM | POA: Diagnosis not present

## 2017-04-11 DIAGNOSIS — R935 Abnormal findings on diagnostic imaging of other abdominal regions, including retroperitoneum: Secondary | ICD-10-CM | POA: Diagnosis not present

## 2017-04-11 LAB — BLADDER SCAN AMB NON-IMAGING: SCAN RESULT: 294

## 2017-04-11 MED ORDER — FINASTERIDE 5 MG PO TABS: 5.0000 mg | ORAL_TABLET | Freq: Every day | ORAL | 3 refills | Status: DC

## 2017-04-11 NOTE — Telephone Encounter (Signed)
Pt was seen by Dr Candiss Norse (Nephrology)- he was sent to [facility] by Dr Candiss Norse- they need to return to Dr Candiss Norse or give Singh's office a call to let them know that [facility] has cleared pt. Brylin does not need to see [place] until October

## 2017-04-11 NOTE — Telephone Encounter (Signed)
-----   Message from Nori Riis, PA-C sent at 04/11/2017  1:29 PM EDT ----- Please let Mr. Spring's caregiver know that his x-ray did not demonstrate any bladder stones, but it did show constipation.  I suggest they speak with his PCP regarding treating the constipation.

## 2017-04-11 NOTE — Telephone Encounter (Signed)
Eddie Dibbles stated that he is with [facility] where pt lives. Eddie Dibbles stated that [facility] advised that they didn't find any kidney stones in pt's bladder. Eddie Dibbles advised that he was pt to come back in to see Dr. Ronnald Ramp for a follow up and I schedule appt for Monday 04/17/17 @ 1030. Paul request that Baxter Flattery be advised and if she needed she could call him back. Thanks TNP

## 2017-04-11 NOTE — Telephone Encounter (Signed)
Spoke with pt caregiver in reference to KUB results and constipation. Caregiver voiced understanding.

## 2017-04-11 NOTE — Telephone Encounter (Signed)
Spoke with [name] and advised him of the info below. [name] voiced understanding and stated he would contact  Dr. Keturah Barre office and would call back to schedule a follow up for October with this office. I have canceled the appt for 04/17/17. Thanks TNP

## 2017-04-11 NOTE — Progress Notes (Signed)
04/11/2017 12:17 PM   [name] 05/26/1946 [id number]  Referring provider: Murlean Iba, MD [address], [place] [address]  Chief Complaint  Patient presents with  . New Patient (Initial Visit)    incontinence referred by Murlean Iba MD    HPI: Patient is a 71 year old African-American male who is referred by Dr. Murlean Iba for urinary incontinence with his caregiver.  Patient is a poor historian.    He had been seen by urology in 2008 for an obstructing left proximal ureteral calculus and bladder calculi. He underwent PCNL and cystolithopaxy in 2008.   Noncontrast CT performed 2011 noted bilateral nephrolithiasis. I have independently reviewed the films.  He has not had any symptoms of renal colic or gross hematuria.  BPH WITH LUTS His IPSS score today is 31, which is severe lower urinary tract symptomatology. He is mostly dissatisfied with his quality life due to his urinary symptoms.  His PVR is 294 mL.  His major complaints today are frequency, urgency, nocturia, incontinence, intermittency, hesitancy and straining to urinate.  He has had these symptoms for a few months.  He denies any dysuria, hematuria or suprapubic pain.   He currently taking tamsulosin 0.4 mg daily.  He also denies any recent fevers, chills, nausea or vomiting.  He does not have a family history of PCa.      IPSS    Row Name 04/11/17 1100         International Prostate Symptom Score   How often have you had the sensation of not emptying your bladder? About half the time     How often have you had to urinate less than every two hours? Almost always     How often have you found you stopped and started again several times when you urinated? Almost always     How often have you found it difficult to postpone urination? Almost always     How often have you had a weak urinary stream? Almost always     How often have you had to strain to start urination? More than half  the time     How many times did you typically get up at night to urinate? 4 Times     Total IPSS Score 31       Quality of Life due to urinary symptoms   If you were to spend the rest of your life with your urinary condition just the way it is now how would you feel about that? Mostly Disatisfied        Score:  1-7 Mild 8-19 Moderate 20-35 Severe    PMH: Past Medical History:  Diagnosis Date  . Alcoholism ([place])   . Allergy   . Altered mental status 2011   hx of 2/2 hypercalcemia, EtOH w/d and medication overuse  . Angina    mild pain on arm, left chest, moves down left chest  . Chronic renal insufficiency    with history of acute on chronic secondary to dehydration and hypercalcemia  . Dementia   . Encephalopathy   . Fx humeral neck 2008   right side, no history of surgical repair  . History of nephrolithiasis 2008   s/p ureterolithotomy   . Hypercalcemia    2/2 primary hyperparathyroidism  . Hypertension   . Pancreatitis 2011   history of several admissions for pancreatitis   . Parathyroid adenoma   . Primary hyperparathyroidism ([place])    s/p parathyroid adenoma resection Oct.  8, 2011, repeat scan showed residual right adenoma, however pt did not f/u with surgery as outpt  . Schizophrenia ([place])    previously on zyprexa  . Tobacco abuse     Surgical History: Past Surgical History:  Procedure Laterality Date  . Ankle Pinning    . cysto, removal of bladder stone, attempted stent placement  08/15/2007  . cystoscopy, laser cystolithoplaxy  08/16/2007  . KIDNEY STONE SURGERY  07/2007 X2   /E-chart  . open left proximal ureterolithotomy  09/18/2007  . parathyroid adenoma resection  Oct. 8, 2011   residual adenoma Oct. 12, 2011  . PARATHYROIDECTOMY  03/09/2012   Procedure: PARATHYROIDECTOMY;  Surgeon: Pedro Earls, MD;  Location: [place];  Service: General;  Laterality: N/A;  mediastinal paraqthyroidectomy   . THYROIDECTOMY  03/09/12    Home Medications:  Allergies  as of 04/11/2017   No Known Allergies     Medication List       Accurate as of 04/11/17 12:17 PM. Always use your most recent med list.          amLODipine 10 MG tablet Commonly known as:  NORVASC Take 1 tablet (10 mg total) by mouth daily.   atorvastatin 20 MG tablet Commonly known as:  LIPITOR Take 1 tablet (20 mg total) by mouth daily.   docusate sodium 100 MG capsule Commonly known as:  COLACE Take 1 capsule (100 mg total) by mouth 2 (two) times daily.   ergocalciferol 50000 units capsule Commonly known as:  VITAMIN D2 Take 1 capsule (50,000 Units total) by mouth every 6 (six) weeks.   finasteride 5 MG tablet Commonly known as:  PROSCAR Take 1 tablet (5 mg total) by mouth daily.   fluticasone 50 MCG/ACT nasal spray Commonly known as:  FLONASE USE 2 SPRAYS IN EACH NOSTRIL DAILY AS NEEDED   folic acid 1 MG tablet Commonly known as:  FOLVITE Take 1 tablet (1 mg total) by mouth daily.   glimepiride 2 MG tablet Commonly known as:  AMARYL Take 1 tablet (2 mg total) by mouth 2 (two) times daily.   glimepiride 1 MG tablet Commonly known as:  AMARYL Take 1 tablet (1 mg total) by mouth daily with breakfast. Add 1mg  to 2mg = 3mg  BID   glucose blood test strip 1 each by Other route 2 (two) times daily. Use as instructed   labetalol 100 MG tablet Commonly known as:  NORMODYNE Take 1 tablet (100 mg total) by mouth 2 (two) times daily.   Melatonin 1 MG Tabs Take 1 tablet (1 mg total) by mouth daily.   metoprolol succinate 25 MG 24 hr tablet Commonly known as:  TOPROL-XL Take 1 tablet (25 mg total) by mouth daily.   multivitamin with minerals tablet TAKE 1 TABLET BY MOUTH DAILY   OLANZapine 15 MG tablet Commonly known as:  ZYPREXA Take 2 tablets by mouth at bedtime.   polyethylene glycol powder powder Commonly known as:  GLYCOLAX/MIRALAX MIX 17 GRAMS (1 CAPFUL) IN 8 OUNCES WATER & DRINK DAILY   tamsulosin 0.4 MG Caps capsule Commonly known as:  FLOMAX TAKE  1 CAPSULE BY MOUTH DAILY IN THE MORNING   thiamine 100 MG tablet Commonly known as:  VITAMIN B-1 Take 1 tablet (100 mg total) by mouth daily.   TRUEPLUS LANCETS 26G Misc 1 each by Does not apply route 2 (two) times daily.       Allergies: No Known Allergies  Family History: Family History  Problem Relation Age of Onset  .  Kidney cancer Neg Hx   . Bladder Cancer Neg Hx   . Prostate cancer Neg Hx     Social History:  reports that he has been smoking Cigarettes.  He has a 50.00 pack-year smoking history. He has never used smokeless tobacco. He reports that he drinks alcohol. He reports that he uses drugs, including Marijuana.  ROS: UROLOGY Frequent Urination?: Yes Hard to postpone urination?: Yes Burning/pain with urination?: No Get up at night to urinate?: Yes Leakage of urine?: Yes Urine stream starts and stops?: Yes Trouble starting stream?: Yes Do you have to strain to urinate?: Yes Blood in urine?: No Urinary tract infection?: No Sexually transmitted disease?: No Injury to kidneys or bladder?: No Painful intercourse?: No Weak stream?: No Erection problems?: No Penile pain?: No  Gastrointestinal Nausea?: No Vomiting?: No Indigestion/heartburn?: No Diarrhea?: No Constipation?: No  Constitutional Fever: No Night sweats?: No Weight loss?: No Fatigue?: No  Skin Skin rash/lesions?: No Itching?: No  Eyes Blurred vision?: No Double vision?: No  Ears/Nose/Throat Sore throat?: No Sinus problems?: No  Hematologic/Lymphatic Swollen glands?: No Easy bruising?: No  Cardiovascular Leg swelling?: No Chest pain?: No  Respiratory Cough?: No Shortness of breath?: No  Endocrine Excessive thirst?: No  Musculoskeletal Back pain?: No Joint pain?: No  Neurological Headaches?: No Dizziness?: No  Psychologic Depression?: No Anxiety?: No  Physical Exam: BP 106/69   Pulse (!) 55   Ht 5\' 8"  (1.727 m)   Wt 150 lb 3.2 oz (68.1 kg)   BMI 22.84  kg/m   Constitutional: Well nourished. Alert and oriented, No acute distress. HEENT: [place] AT, moist mucus membranes. Trachea midline, no masses. Cardiovascular: No clubbing, cyanosis, or edema. Respiratory: Normal respiratory effort, no increased work of breathing. GI: Abdomen is soft, non tender, non distended, no abdominal masses. Liver and spleen not palpable.  No hernias appreciated.  Stool sample for occult testing is not indicated.   GU: No CVA tenderness.  No bladder fullness or masses.  Patient with circumcised phallus.   Urethral meatus is patent.  No penile discharge. No penile lesions or rashes. Scrotum without lesions, cysts, rashes and/or edema.  Testicles are located scrotally bilaterally. No masses are appreciated in the testicles. Left and right epididymis are normal. Rectal: Patient with  normal sphincter tone. Anus and perineum without scarring or rashes. No rectal masses are appreciated. Prostate is approximately 70 grams, no nodules are appreciated. Seminal vesicles are normal. Skin: No rashes, bruises or suspicious lesions. Lymph: No cervical or inguinal adenopathy. Neurologic: Grossly intact, no focal deficits, moving all 4 extremities. Psychiatric: Normal mood and affect.  Laboratory Data: Lab Results  Component Value Date   WBC 7.5 03/08/2017   HGB 12.7 (L) 09/10/2013   HCT 42.6 03/08/2017   MCV 92 03/08/2017   PLT 255 03/08/2017    Lab Results  Component Value Date   CREATININE 3.09 (H) 03/08/2017    Lab Results  Component Value Date   PSA 1.79 Test Methodology: [employer]ch PSA 08/13/2007    Lab Results  Component Value Date   HGBA1C 9.6 (H) 03/08/2017    Lab Results  Component Value Date   TSH 1.237 11/09/2011       Component Value Date/Time   CHOL 133 03/08/2017 1102   HDL 41 03/08/2017 1102   CHOLHDL 3.2 03/08/2017 1102   CHOLHDL 4.8 08/15/2010 1634   VLDL 28 08/15/2010 1634   LDLCALC 62 03/08/2017 1102    Lab Results  Component Value  Date   AST  20 09/10/2013   Lab Results  Component Value Date   ALT 10 09/10/2013     Pertinent Imaging: Clinical Data: Abdominal pain.  Nausea vomiting.  Urolithiasis.   CT ABDOMEN AND PELVIS WITHOUT CONTRAST   Technique:  Multidetector CT imaging of the abdomen and pelvis was performed following the standard protocol without intravenous contrast.   Comparison: 08/20/2007   Findings: Diffuse swelling of the pancreas is seen with marked peripancreatic inflammatory changes.  Pleural fluid is seen tracking inferiorly in the right anterior pararenal space.  No organized pancreatic pseudocysts are identified.   Cholelithiasis again noted, without evidence of cholecystitis. Gallbladder is contracted.  Noncontrast images of the liver and spleen appear normal.  Bilateral nonobstructing intrarenal calculi are seen, however there is no evidence of ureteral calculi or hydronephrosis.   Moderately enlarged prostate is again seen with mass effect on the bladder base.  No soft tissue masses or lymphadenopathy identified. A small amount of free fluid is seen in the pelvic cul-de-sac. Diverticulosis is seen involving the descending sigmoid colon, however there is no evidence of diverticulitis.   IMPRESSION:   1.  Severe acute pancreatitis with inflammatory change and fluid tracking inferiorly into the lower abdomen.  A small amount of free fluid and pelvic cul-de-sac.  No organized pseudocyst identified. 2.  Cholelithiasis. 3.  Moderately enlarged prostate. 4. Diverticulosis.  No radiographic evidence of diverticulitis. 5.  Bilateral nonobstructing nephrolithiasis.  No evidence of hydronephrosis.  Provider: Beaulah Corin  Results for JEB, SCHLOEMER (MRN [id number]) as of 04/11/2017 12:13  Ref. Range 04/11/2017 11:26  Scan Result Unknown 294    Assessment & Plan:    1. BPH with LUTS  - IPSS score is 31/4  - Continue conservative management, avoiding bladder irritants and  timed voiding's  - most bothersome symptoms is/are frequency and incontinence  - Initiate 5 alpha reductase inhibitor (finasteride 5 mg daily), discussed side effects   - Continue tamsulosin 0.4 mg daily  - RTC in 3 months for I PSS and PVR  2. History of nephrolithiasis  - Bilateral nephrolithiasis seen on CT in 2011  - obtain a KUB today  3. History of bladder calculus  - see above  Return in about 3 months (around 07/12/2017) for IPSS and PVR.  These notes generated with voice recognition software. I apologize for typographical errors.  Zara Council, [facility] [address] [place], [place] [address] [phone]

## 2017-04-17 ENCOUNTER — Inpatient Hospital Stay
Admission: EM | Admit: 2017-04-17 | Discharge: 2017-04-19 | DRG: 638 | Disposition: A | Discharge status: Home / Self Care | Payer: Medicare Other | Attending: Internal Medicine | Admitting: Internal Medicine

## 2017-04-17 ENCOUNTER — Encounter: Payer: Self-pay | Admitting: Emergency Medicine

## 2017-04-17 ENCOUNTER — Other Ambulatory Visit: Payer: Self-pay | Admitting: Family Medicine

## 2017-04-17 ENCOUNTER — Encounter: Payer: Self-pay | Admitting: Family Medicine

## 2017-04-17 ENCOUNTER — Ambulatory Visit: Payer: Medicare Other | Admitting: Family Medicine

## 2017-04-17 ENCOUNTER — Ambulatory Visit (INDEPENDENT_AMBULATORY_CARE_PROVIDER_SITE_OTHER): Payer: Medicare Other | Admitting: Family Medicine

## 2017-04-17 VITALS — BP 116/64 | HR 78 | Ht 68.0 in | Wt 146.0 lb

## 2017-04-17 DIAGNOSIS — K5900 Constipation, unspecified: Secondary | ICD-10-CM

## 2017-04-17 DIAGNOSIS — R339 Retention of urine, unspecified: Secondary | ICD-10-CM | POA: Diagnosis not present

## 2017-04-17 DIAGNOSIS — E1122 Type 2 diabetes mellitus with diabetic chronic kidney disease: Secondary | ICD-10-CM | POA: Diagnosis not present

## 2017-04-17 DIAGNOSIS — E875 Hyperkalemia: Secondary | ICD-10-CM | POA: Diagnosis not present

## 2017-04-17 DIAGNOSIS — E785 Hyperlipidemia, unspecified: Secondary | ICD-10-CM | POA: Diagnosis not present

## 2017-04-17 DIAGNOSIS — F17210 Nicotine dependence, cigarettes, uncomplicated: Secondary | ICD-10-CM | POA: Diagnosis not present

## 2017-04-17 DIAGNOSIS — N179 Acute kidney failure, unspecified: Secondary | ICD-10-CM | POA: Diagnosis not present

## 2017-04-17 DIAGNOSIS — R739 Hyperglycemia, unspecified: Secondary | ICD-10-CM | POA: Diagnosis present

## 2017-04-17 DIAGNOSIS — F1020 Alcohol dependence, uncomplicated: Secondary | ICD-10-CM | POA: Diagnosis not present

## 2017-04-17 DIAGNOSIS — E871 Hypo-osmolality and hyponatremia: Secondary | ICD-10-CM | POA: Diagnosis present

## 2017-04-17 DIAGNOSIS — F0390 Unspecified dementia without behavioral disturbance: Secondary | ICD-10-CM | POA: Diagnosis present

## 2017-04-17 DIAGNOSIS — E1100 Type 2 diabetes mellitus with hyperosmolarity without nonketotic hyperglycemic-hyperosmolar coma (NKHHC): Principal | ICD-10-CM | POA: Diagnosis present

## 2017-04-17 DIAGNOSIS — I129 Hypertensive chronic kidney disease with stage 1 through stage 4 chronic kidney disease, or unspecified chronic kidney disease: Secondary | ICD-10-CM | POA: Diagnosis not present

## 2017-04-17 DIAGNOSIS — F209 Schizophrenia, unspecified: Secondary | ICD-10-CM | POA: Diagnosis present

## 2017-04-17 DIAGNOSIS — E119 Type 2 diabetes mellitus without complications: Secondary | ICD-10-CM

## 2017-04-17 DIAGNOSIS — Z7984 Long term (current) use of oral hypoglycemic drugs: Secondary | ICD-10-CM

## 2017-04-17 DIAGNOSIS — N184 Chronic kidney disease, stage 4 (severe): Secondary | ICD-10-CM | POA: Diagnosis present

## 2017-04-17 DIAGNOSIS — I10 Essential (primary) hypertension: Secondary | ICD-10-CM | POA: Diagnosis not present

## 2017-04-17 DIAGNOSIS — Z79899 Other long term (current) drug therapy: Secondary | ICD-10-CM

## 2017-04-17 DIAGNOSIS — R4182 Altered mental status, unspecified: Secondary | ICD-10-CM

## 2017-04-17 HISTORY — DX: Type 2 diabetes mellitus without complications: E11.9

## 2017-04-17 LAB — URINALYSIS, COMPLETE (UACMP) WITH MICROSCOPIC
BACTERIA UA: NONE SEEN
Bilirubin Urine: NEGATIVE
Hgb urine dipstick: NEGATIVE
KETONES UR: 5 mg/dL — AB
Nitrite: NEGATIVE
PROTEIN: NEGATIVE mg/dL
RBC / HPF: NONE SEEN RBC/hpf (ref 0–5)
SQUAMOUS EPITHELIAL / LPF: NONE SEEN
Specific Gravity, Urine: 1.022 (ref 1.005–1.030)
pH: 5 (ref 5.0–8.0)

## 2017-04-17 LAB — GLUCOSE, CAPILLARY
GLUCOSE-CAPILLARY: 148 mg/dL — AB (ref 65–99)
GLUCOSE-CAPILLARY: 154 mg/dL — AB (ref 65–99)
GLUCOSE-CAPILLARY: 191 mg/dL — AB (ref 65–99)
GLUCOSE-CAPILLARY: 99 mg/dL (ref 65–99)
Glucose-Capillary: 567 mg/dL (ref 65–99)
Glucose-Capillary: 325 mg/dL — ABNORMAL HIGH (ref 65–99)

## 2017-04-17 LAB — CBC WITH DIFFERENTIAL/PLATELET
BASOS ABS: 0 10*3/uL (ref 0–0.1)
BASOS PCT: 0 %
EOS PCT: 1 %
Eosinophils Absolute: 0 10*3/uL (ref 0–0.7)
HCT: 43.2 % (ref 40.0–52.0)
Hemoglobin: 14.1 g/dL (ref 13.0–18.0)
LYMPHS PCT: 20 %
Lymphs Abs: 1.4 10*3/uL (ref 1.0–3.6)
MCH: 29.8 pg (ref 26.0–34.0)
MCHC: 32.6 g/dL (ref 32.0–36.0)
MCV: 91.3 fL (ref 80.0–100.0)
Monocytes Absolute: 0.8 10*3/uL (ref 0.2–1.0)
Monocytes Relative: 11 %
NEUTROS ABS: 4.7 10*3/uL (ref 1.4–6.5)
Neutrophils Relative %: 68 %
Platelets: 219 10*3/uL (ref 150–440)
RBC: 4.73 MIL/uL (ref 4.40–5.90)
RDW: 14.4 % (ref 11.5–14.5)
WBC: 6.9 10*3/uL (ref 3.8–10.6)

## 2017-04-17 LAB — BASIC METABOLIC PANEL
Anion gap: 10 (ref 5–15)
BUN: 66 mg/dL — ABNORMAL HIGH (ref 6–20)
CHLORIDE: 115 mmol/L — AB (ref 101–111)
CO2: 20 mmol/L — AB (ref 22–32)
CREATININE: 2.89 mg/dL — AB (ref 0.61–1.24)
Calcium: 11.7 mg/dL — ABNORMAL HIGH (ref 8.9–10.3)
GFR calc non Af Amer: 21 mL/min — ABNORMAL LOW (ref >60)
GFR, EST AFRICAN AMERICAN: 24 mL/min — AB (ref >60)
Glucose, Bld: 120 mg/dL — ABNORMAL HIGH (ref 65–99)
POTASSIUM: 3.3 mmol/L — AB (ref 3.5–5.1)
Sodium: 145 mmol/L (ref 135–145)

## 2017-04-17 LAB — COMPREHENSIVE METABOLIC PANEL
ALBUMIN: 4.1 g/dL (ref 3.5–5.0)
ALT: 44 U/L (ref 17–63)
AST: 20 U/L (ref 15–41)
Alkaline Phosphatase: 94 U/L (ref 38–126)
Anion gap: 12 (ref 5–15)
BUN: 72 mg/dL — AB (ref 6–20)
CHLORIDE: 96 mmol/L — AB (ref 101–111)
CO2: 20 mmol/L — AB (ref 22–32)
CREATININE: 3.18 mg/dL — AB (ref 0.61–1.24)
Calcium: 11.5 mg/dL — ABNORMAL HIGH (ref 8.9–10.3)
GFR calc Af Amer: 21 mL/min — ABNORMAL LOW (ref >60)
GFR calc non Af Amer: 18 mL/min — ABNORMAL LOW (ref >60)
GLUCOSE: 945 mg/dL — AB (ref 65–99)
Potassium: 5.8 mmol/L — ABNORMAL HIGH (ref 3.5–5.1)
Sodium: 128 mmol/L — ABNORMAL LOW (ref 135–145)
Total Bilirubin: 0.9 mg/dL (ref 0.3–1.2)
Total Protein: 8.3 g/dL — ABNORMAL HIGH (ref 6.5–8.1)

## 2017-04-17 LAB — MRSA PCR SCREENING: MRSA BY PCR: NEGATIVE

## 2017-04-17 LAB — POCT CBG (FASTING - GLUCOSE)-MANUAL ENTRY

## 2017-04-17 LAB — TROPONIN I

## 2017-04-17 MED ORDER — FOLIC ACID 1 MG PO TABS
1.0000 mg | ORAL_TABLET | Freq: Every day | ORAL | Status: DC
  Administered 2017-04-17 – 2017-04-19 (×3): 1 mg via ORAL
  Filled 2017-04-17 (×3): qty 1

## 2017-04-17 MED ORDER — ACETAMINOPHEN 650 MG RE SUPP: 650.0000 mg | Freq: Four times a day (QID) | RECTAL | Status: DC | PRN

## 2017-04-17 MED ORDER — VITAMIN B-1 100 MG PO TABS
100.0000 mg | ORAL_TABLET | Freq: Every day | ORAL | Status: DC
  Administered 2017-04-17 – 2017-04-19 (×3): 100 mg via ORAL
  Filled 2017-04-17 (×3): qty 1

## 2017-04-17 MED ORDER — ONDANSETRON HCL 4 MG PO TABS: 4.0000 mg | ORAL_TABLET | Freq: Four times a day (QID) | ORAL | Status: DC | PRN

## 2017-04-17 MED ORDER — ACETAMINOPHEN 325 MG PO TABS: 650.0000 mg | ORAL_TABLET | Freq: Four times a day (QID) | ORAL | Status: DC | PRN

## 2017-04-17 MED ORDER — SODIUM CHLORIDE 0.9 % IV SOLN
INTRAVENOUS | Status: DC
  Administered 2017-04-17: 5.4 [IU]/h via INTRAVENOUS
  Filled 2017-04-17: qty 1

## 2017-04-17 MED ORDER — LABETALOL HCL 100 MG PO TABS
100.0000 mg | ORAL_TABLET | Freq: Two times a day (BID) | ORAL | Status: DC
  Administered 2017-04-17 – 2017-04-19 (×4): 100 mg via ORAL
  Filled 2017-04-17 (×4): qty 1

## 2017-04-17 MED ORDER — TAMSULOSIN HCL 0.4 MG PO CAPS
0.4000 mg | ORAL_CAPSULE | Freq: Every day | ORAL | Status: DC
  Administered 2017-04-17 – 2017-04-19 (×3): 0.4 mg via ORAL
  Filled 2017-04-17 (×3): qty 1

## 2017-04-17 MED ORDER — METOPROLOL SUCCINATE ER 25 MG PO TB24
25.0000 mg | ORAL_TABLET | Freq: Every day | ORAL | Status: DC
  Administered 2017-04-17 – 2017-04-19 (×3): 25 mg via ORAL
  Filled 2017-04-17 (×3): qty 1

## 2017-04-17 MED ORDER — ENOXAPARIN SODIUM 30 MG/0.3ML ~~LOC~~ SOLN
30.0000 mg | SUBCUTANEOUS | Status: DC
  Administered 2017-04-17 – 2017-04-18 (×2): 30 mg via SUBCUTANEOUS
  Filled 2017-04-17 (×2): qty 0.3

## 2017-04-17 MED ORDER — FINASTERIDE 5 MG PO TABS
5.0000 mg | ORAL_TABLET | Freq: Every day | ORAL | Status: DC
  Administered 2017-04-17 – 2017-04-19 (×3): 5 mg via ORAL
  Filled 2017-04-17 (×3): qty 1

## 2017-04-17 MED ORDER — SODIUM CHLORIDE 0.9 % IV SOLN
1000.0000 mL | Freq: Once | INTRAVENOUS | Status: AC
  Administered 2017-04-17: 1000 mL via INTRAVENOUS

## 2017-04-17 MED ORDER — MELATONIN 5 MG PO TABS
2.5000 mg | ORAL_TABLET | Freq: Every day | ORAL | Status: DC
  Administered 2017-04-17: 2.5 mg via ORAL
  Filled 2017-04-17 (×2): qty 0.5

## 2017-04-17 MED ORDER — GLIMEPIRIDE 1 MG PO TABS
1.0000 mg | ORAL_TABLET | Freq: Two times a day (BID) | ORAL | Status: DC
  Filled 2017-04-17: qty 1

## 2017-04-17 MED ORDER — SODIUM POLYSTYRENE SULFONATE 15 GM/60ML PO SUSP
30.0000 g | Freq: Once | ORAL | Status: DC
  Filled 2017-04-17: qty 120

## 2017-04-17 MED ORDER — SODIUM POLYSTYRENE SULFONATE 15 GM/60ML PO SUSP
30.0000 g | Freq: Once | ORAL | Status: AC
  Administered 2017-04-17: 30 g via ORAL

## 2017-04-17 MED ORDER — DOCUSATE SODIUM 100 MG PO CAPS
100.0000 mg | ORAL_CAPSULE | Freq: Two times a day (BID) | ORAL | Status: DC
  Administered 2017-04-17 – 2017-04-19 (×4): 100 mg via ORAL
  Filled 2017-04-17 (×4): qty 1

## 2017-04-17 MED ORDER — AMLODIPINE BESYLATE 10 MG PO TABS
10.0000 mg | ORAL_TABLET | Freq: Every day | ORAL | Status: DC
  Administered 2017-04-17 – 2017-04-19 (×3): 10 mg via ORAL
  Filled 2017-04-17 (×3): qty 1

## 2017-04-17 MED ORDER — ATORVASTATIN CALCIUM 20 MG PO TABS
20.0000 mg | ORAL_TABLET | Freq: Every day | ORAL | Status: DC
  Administered 2017-04-17 – 2017-04-19 (×3): 20 mg via ORAL
  Filled 2017-04-17 (×3): qty 1

## 2017-04-17 MED ORDER — VITAMIN D (ERGOCALCIFEROL) 1.25 MG (50000 UNIT) PO CAPS
50000.0000 [IU] | ORAL_CAPSULE | ORAL | Status: DC
Start: 2017-04-17 — End: 2017-04-19

## 2017-04-17 MED ORDER — FLUTICASONE PROPIONATE 50 MCG/ACT NA SUSP
2.0000 | Freq: Every day | NASAL | Status: DC
  Filled 2017-04-17: qty 16

## 2017-04-17 MED ORDER — HYDRALAZINE HCL 20 MG/ML IJ SOLN: 10.0000 mg | Freq: Four times a day (QID) | INTRAMUSCULAR | Status: DC | PRN

## 2017-04-17 MED ORDER — SODIUM CHLORIDE 0.9 % IV SOLN: INTRAVENOUS | Status: DC

## 2017-04-17 MED ORDER — ADULT MULTIVITAMIN W/MINERALS CH
1.0000 | ORAL_TABLET | Freq: Every day | ORAL | Status: DC
  Administered 2017-04-17 – 2017-04-19 (×3): 1 via ORAL
  Filled 2017-04-17 (×3): qty 1

## 2017-04-17 MED ORDER — ONDANSETRON HCL 4 MG/2ML IJ SOLN: 4.0000 mg | Freq: Four times a day (QID) | INTRAMUSCULAR | Status: DC | PRN

## 2017-04-17 MED ORDER — GLIMEPIRIDE 2 MG PO TABS
2.0000 mg | ORAL_TABLET | Freq: Two times a day (BID) | ORAL | Status: DC
  Filled 2017-04-17: qty 1

## 2017-04-17 MED ORDER — POLYETHYLENE GLYCOL 3350 17 GM/SCOOP PO POWD: ORAL | 0 refills | Status: DC

## 2017-04-17 MED ORDER — SODIUM CHLORIDE 0.9 % IV SOLN
INTRAVENOUS | Status: DC
  Administered 2017-04-17: 20:00:00 via INTRAVENOUS

## 2017-04-17 MED ORDER — OLANZAPINE 10 MG PO TABS
30.0000 mg | ORAL_TABLET | Freq: Every day | ORAL | Status: DC
  Administered 2017-04-17 – 2017-04-18 (×2): 30 mg via ORAL
  Filled 2017-04-17 (×3): qty 3

## 2017-04-17 MED ORDER — HYDROCODONE-ACETAMINOPHEN 5-325 MG PO TABS: 1.0000 | ORAL_TABLET | ORAL | Status: DC | PRN

## 2017-04-17 NOTE — ED Notes (Signed)
Incontinent large amount of urine.  Linen changed.  Skin care given.

## 2017-04-17 NOTE — Progress Notes (Signed)
Anticoagulation monitoring(Lovenox):  71 yo  male ordered Lovenox 40 mg Q24h  Filed Weights   04/17/17 1348  Weight: 146 lb (66.2 kg)   BMI    Lab Results  Component Value Date   CREATININE 3.18 (H) 04/17/2017   CREATININE 3.09 (H) 03/08/2017   CREATININE 2.67 (H) 10/06/2016   Estimated Creatinine Clearance: 20.5 mL/min (A) (by C-G formula based on SCr of 3.18 mg/dL (H)). Hemoglobin & Hematocrit     Component Value Date/Time   HGB 14.1 04/17/2017 1448   HCT 43.2 04/17/2017 1448   HCT 42.6 03/08/2017 1102     Per Protocol for Patient with estCrcl < 30 ml/min and BMI < 40, will transition to Lovenox 30 mg Q24h.

## 2017-04-17 NOTE — ED Notes (Signed)
FN; sent over for high blood sugar.

## 2017-04-17 NOTE — ED Notes (Signed)
Bladder scanned patient, volure read >285 ml.  Dr. Jimmye Norman notified.

## 2017-04-17 NOTE — Progress Notes (Signed)
eLink Physician-Brief Progress Note Patient Name: [name] DOB: 1946-06-24 MRN: [id number]   Date of Service  04/17/2017  HPI/Events of Note  New patient evaluation.   71 year old male, known to have schizophrenia and diabetes, from a group home, admitted for hyperglycemic state. Initial sugar was 900 mg percent plus. Has received insulin bolus as well as is currently on insulin drip. Currently getting IV fluids.   Patient seen, comfortable, not in distress.  Blood pressure 124/88, heart rate 80, respiratory rate 22, sats 100% on nasal cannula.   eICU Interventions  Continue insulin drip. Continue IV fluids. Continue current management.        Intervention Category Evaluation Type: New Patient Evaluation  Rush Landmark 04/17/2017, 7:57 PM

## 2017-04-17 NOTE — Consult Note (Signed)
Name: [name] MRN: [id number] DOB: January 19, 1946    ADMISSION DATE:  04/17/2017 CONSULTATION DATE:  04/17/2017  REFERRING MD :  Dr. Posey Pronto  CHIEF COMPLAINT:  Hyperglycemia   BRIEF PATIENT DESCRIPTION:  71 yo male admitted 05/21 with hyperglycemia requiring insulin gtt  SIGNIFICANT EVENTS  05/21-Pt admitted to [facility] Unit   STUDIES:  None  HISTORY OF PRESENT ILLNESS:   This is a 71 yo male with PMH of Tobacco Abuse, Schizophrenia, Primary Hyperparathyroidism, Parathyroid Adenoma, Pancreatitis, HTN, Hypercalcemia, Nephrolithiasis, Encephalopathy, Diabetes Mellitus, Dementia, Chronic Renal Failure, Angina, and Alcoholism.  He presented to [facility] ER 05/21 from a group home with elevated blood sugars greater than 600.  In the ER lab results revealed Na+ 128, K+ 5.8, serum glucose 945, creatinine 3.18, anion gap 12, and calcium 11.5. Therefore, insulin gtt initiated and pt admitted to the [facility] Unit for further workup and management. Most recent outpatient hemoglobin A1c was 9.6 and serum glucose 375 on 03/08/17, PCP increased amaryl dosage from 1 mg to 2 mg by mouth bid.  PAST MEDICAL HISTORY :   has a past medical history of Alcoholism ([place]); Allergy; Altered mental status (2011); Angina; Chronic renal insufficiency; Dementia; DM (diabetes mellitus) ([place]); Encephalopathy; Fx humeral neck (2008); History of nephrolithiasis (2008); Hypercalcemia; Hypertension; Pancreatitis (2011); Parathyroid adenoma; Primary hyperparathyroidism ([place]); Schizophrenia ([place]); and Tobacco abuse.  has a past surgical history that includes parathyroid adenoma resection (Oct. 8, 2011); Ankle Pinning; open left proximal ureterolithotomy (09/18/2007); cystoscopy, laser cystolithoplaxy (08/16/2007); cysto, removal of bladder stone, attempted stent placement (08/15/2007); Thyroidectomy (03/09/12); Kidney stone surgery (07/2007 X2); and Parathyroidectomy (03/09/2012). Prior to Admission medications   Medication Sig  Start Date End Date Taking? Authorizing Provider  amLODipine (NORVASC) 10 MG tablet Take 1 tablet (10 mg total) by mouth daily. 03/02/17  Yes Juline Patch, MD  atorvastatin (LIPITOR) 20 MG tablet Take 1 tablet (20 mg total) by mouth daily. 03/02/17  Yes Juline Patch, MD  docusate sodium (COLACE) 100 MG capsule Take 1 capsule (100 mg total) by mouth 2 (two) times daily. 03/02/17  Yes Juline Patch, MD  ergocalciferol (VITAMIN D2) 50000 units capsule Take 1 capsule (50,000 Units total) by mouth every 6 (six) weeks. 03/02/17  Yes Juline Patch, MD  finasteride (PROSCAR) 5 MG tablet Take 1 tablet (5 mg total) by mouth daily. 04/11/17  Yes McGowan, Larene Beach A, PA-C  fluticasone (FLONASE) 50 MCG/ACT nasal spray USE 2 SPRAYS IN EACH NOSTRIL DAILY AS NEEDED 03/02/17  Yes Juline Patch, MD  folic acid (FOLVITE) 1 MG tablet Take 1 tablet (1 mg total) by mouth daily. 03/02/17  Yes Juline Patch, MD  glimepiride (AMARYL) 1 MG tablet Take 1 mg by mouth 2 (two) times daily.   Yes [provider]  glimepiride (AMARYL) 2 MG tablet Take 1 tablet (2 mg total) by mouth 2 (two) times daily. 03/02/17  Yes Juline Patch, MD  labetalol (NORMODYNE) 100 MG tablet Take 1 tablet (100 mg total) by mouth 2 (two) times daily. 03/02/17  Yes Juline Patch, MD  Melatonin 1 MG TABS Take 1 tablet (1 mg total) by mouth daily. 10/05/16  Yes Juline Patch, MD  metoprolol succinate (TOPROL-XL) 25 MG 24 hr tablet Take 1 tablet (25 mg total) by mouth daily. 03/02/17  Yes Juline Patch, MD  Multiple Vitamins-Minerals (MULTIVITAMIN WITH MINERALS) tablet TAKE 1 TABLET BY MOUTH DAILY 04/17/17  Yes Juline Patch, MD  OLANZapine (ZYPREXA) 15 MG tablet  Take 2 tablets by mouth at bedtime. 01/27/16  Yes [provider]  tamsulosin (FLOMAX) 0.4 MG CAPS capsule TAKE 1 CAPSULE BY MOUTH DAILY IN THE MORNING 03/21/17  Yes Juline Patch, MD  thiamine (VITAMIN B-1) 100 MG tablet Take 1 tablet (100 mg total) by mouth daily. 03/02/17  Yes  Otilio Miu C, MD  glucose blood test strip 1 each by Other route 2 (two) times daily. Use as instructed 10/05/16   Juline Patch, MD  polyethylene glycol powder (GLYCOLAX/MIRALAX) powder MIX 17 GRAMS (1 CAPFUL) IN 8 OUNCES WATER & DRINK DAILY 04/17/17   Juline Patch, MD  TRUEPLUS LANCETS 26G MISC 1 each by Does not apply route 2 (two) times daily. 10/05/16   Juline Patch, MD   No Known Allergies  FAMILY HISTORY:  family history is not on file. SOCIAL HISTORY:  reports that he has been smoking Cigarettes.  He has a 50.00 pack-year smoking history. He has never used smokeless tobacco. He reports that he drinks alcohol. He reports that he uses drugs, including Marijuana.  REVIEW OF SYSTEMS:   Unable to assess pt confused   SUBJECTIVE:  Pt confused but resting in bed with sitter at bedside   VITAL SIGNS: Temp:  [98.5 F (36.9 C)-99.1 F (37.3 C)] 99.1 F (37.3 C) (05/21 1920) Pulse Rate:  [76-90] 76 (05/21 1903) Resp:  [16-25] 20 (05/21 1903) BP: (115-147)/(64-112) 126/100 (05/21 1903) SpO2:  [99 %] 99 % (05/21 1903) Weight:  [65.8 kg (145 lb 1 oz)-66.2 kg (146 lb)] 65.8 kg (145 lb 1 oz) (05/21 1920)  PHYSICAL EXAMINATION: General: well developed, well nourished AA male, NAD Neuro: confused, follows commands, PERRL HEENT: supple, no JVD Cardiovascular: nsr, s1s2, no M/R/G Lungs: clear throughout, even, non labored Abdomen: +BS x4, soft, non tender, non distended Musculoskeletal: normal bulk and tone, no edema Skin: intact no rashes or lesions   Recent Labs Lab 04/17/17 1448  NA 128*  K 5.8*  CL 96*  CO2 20*  BUN 72*  CREATININE 3.18*  GLUCOSE 945*    Recent Labs Lab 04/17/17 1448  HGB 14.1  HCT 43.2  WBC 6.9  PLT 219   No results found.  ASSESSMENT / PLAN: Hyperosmolar Hyperglycemia Nonketotic Syndrome Hyperkalemia Hyponatremia  Chronic hypercalcemia  Acute on chronic renal failure  Hx: Tobacco Abuse, ETOH Abuse, Schizophrenia, Dementia, and  Nephrolithiasis P: Supplemental O2 to maintain O2 sats >92% or for dyspnea Continue insulin gtt will transition to SSI once pt has 6 subsequent CBG readings <180 mg/dl Hypoglycemic protocol  CBG's q1hr while on insulin gtt Repeat BMP Continue NS @100  ml/hr Lovenox for VTE prophylaxis  Monitor for s/sx of bleeding Trend CBC Replace electrolytes as indicated Monitor UOP Continue outpatient amlodipine, atorvastatin, finasteride, labetalol, and flomax Continue multivitamin, folic acid, and thiamine Continuous telemetry monitoring  Prn Sitter for safety   Marda Stalker, [place] Pager [phone] (please enter 7 digits) PCCM Consult Pager [phone] (please enter 7 digits)   PCCM ATTENDING ATTESTATION:  I have evaluated patient with the APP [place], reviewed database in its entirety and discussed care plan in detail. In addition, this patient was discussed on multidisciplinary rounds.   He is now off of insulin gtt with adequate glycemic control. He is in no distress. Transfer to M-S unit. PCCM will sign off. Please call if we can be of further assistance   Merton Border, MD PCCM service Mobile [phone] Pager [phone] 04/18/2017 12:49 PM

## 2017-04-17 NOTE — Progress Notes (Signed)
Alert to self only and unable to participate in the admission process.  Will continue to monitor.

## 2017-04-17 NOTE — ED Triage Notes (Signed)
Pt presents to clinic with group home member ; stated he was sent here due to blood sugar >600 . S here read high.

## 2017-04-17 NOTE — Progress Notes (Signed)
Name: [name]   MRN: [id number]    DOB: June 23, 1946   Date:04/17/2017       Progress Note  Subjective  Chief Complaint  Chief Complaint  Patient presents with  . Diabetes    recheck A1C  . Constipation    resend RX for miralax    Diabetes  He presents for his follow-up diabetic visit. He has type 2 diabetes mellitus. His disease course has been fluctuating. Pertinent negatives for hypoglycemia include no confusion, dizziness, headaches, hunger, mood changes, nervousness/anxiousness, pallor, seizures, sleepiness, speech difficulty, sweats or tremors. Pertinent negatives for diabetes include no blurred vision, no chest pain, no fatigue, no foot paresthesias, no foot ulcerations, no polydipsia, no polyphagia, no polyuria, no visual change, no weakness and no weight loss. There are no hypoglycemic complications. Symptoms are stable. There are no diabetic complications. Pertinent negatives for diabetic complications include no CVA, PVD or retinopathy. Risk factors for coronary artery disease include hypertension, male sex, dyslipidemia and diabetes mellitus. Current diabetic treatment includes oral agent (monotherapy). He is compliant with treatment most of the time. His weight is stable. He is following a generally healthy diet. His breakfast blood glucose is taken between 8-9 am. His breakfast blood glucose range is generally 130-140 mg/dl. An ACE inhibitor/angiotensin II receptor blocker is being taken. Eye exam is not current.  Constipation  This is a chronic problem. The current episode started more than 1 month ago. The problem has been waxing and waning since onset. The patient is not on a high fiber diet. He does not exercise regularly. There has not been adequate water intake. Associated symptoms include fecal incontinence. Pertinent negatives include no abdominal pain, back pain, diarrhea, difficulty urinating, fever, hematochezia, hemorrhoids, melena, nausea, rectal pain or weight  loss. He has tried stool softeners (suppose to be on miralax) for the symptoms. His past medical history is significant for psychiatric history. There is no history of abdominal surgery or neurologic disease.  Hypertension  This is a chronic problem. The current episode started more than 1 year ago. The problem has been waxing and waning since onset. The problem is controlled. Pertinent negatives include no blurred vision, chest pain, headaches, malaise/fatigue, neck pain, palpitations, shortness of breath or sweats. Past treatments include beta blockers and calcium channel blockers. The current treatment provides moderate improvement. There are no compliance problems.  There is no history of angina, kidney disease, CAD/MI, CVA, heart failure, left ventricular hypertrophy, PVD or retinopathy. There is no history of chronic renal disease, a hypertension causing med or renovascular disease.  Hyperlipidemia  This is a chronic problem. The current episode started more than 1 year ago. The problem is controlled. Recent lipid tests were reviewed and are normal. He has no history of chronic renal disease, diabetes, hypothyroidism, liver disease, obesity or nephrotic syndrome. There are no known factors aggravating his hyperlipidemia. Pertinent negatives include no chest pain, focal sensory loss, focal weakness, myalgias or shortness of breath. He is currently on no antihyperlipidemic treatment. The current treatment provides moderate improvement of lipids. There are no compliance problems.  Risk factors for coronary artery disease include dyslipidemia, hypertension and male sex.  Neurologic Problem  The patient's primary symptoms include an altered mental status, clumsiness and a loss of balance. The patient's pertinent negatives include no focal sensory loss, focal weakness, memory loss, near-syncope, slurred speech, syncope, visual change or weakness. This is a recurrent problem. The current episode started more  than 1 month ago. The neurological problem developed insidiously.  The problem has been gradually worsening since onset. Pertinent negatives include no abdominal pain, back pain, chest pain, confusion, dizziness, fatigue, fever, headaches, nausea, neck pain, palpitations or shortness of breath. Past treatments include medication. The treatment provided moderate relief. There is no history of liver disease or mood changes.    No problem-specific Assessment & Plan notes found for this encounter.   Past Medical History:  Diagnosis Date  . Alcoholism ([place])   . Allergy   . Altered mental status 2011   hx of 2/2 hypercalcemia, EtOH w/d and medication overuse  . Angina    mild pain on arm, left chest, moves down left chest  . Chronic renal insufficiency    with history of acute on chronic secondary to dehydration and hypercalcemia  . Dementia   . Encephalopathy   . Fx humeral neck 2008   right side, no history of surgical repair  . History of nephrolithiasis 2008   s/p ureterolithotomy   . Hypercalcemia    2/2 primary hyperparathyroidism  . Hypertension   . Pancreatitis 2011   history of several admissions for pancreatitis   . Parathyroid adenoma   . Primary hyperparathyroidism ([place])    s/p parathyroid adenoma resection Oct. 8, 2011, repeat scan showed residual right adenoma, however pt did not f/u with surgery as outpt  . Schizophrenia ([place])    previously on zyprexa  . Tobacco abuse     Past Surgical History:  Procedure Laterality Date  . Ankle Pinning    . cysto, removal of bladder stone, attempted stent placement  08/15/2007  . cystoscopy, laser cystolithoplaxy  08/16/2007  . KIDNEY STONE SURGERY  07/2007 X2   /E-chart  . open left proximal ureterolithotomy  09/18/2007  . parathyroid adenoma resection  Oct. 8, 2011   residual adenoma Oct. 12, 2011  . PARATHYROIDECTOMY  03/09/2012   Procedure: PARATHYROIDECTOMY;  Surgeon: Pedro Earls, MD;  Location: [facility];  Service: General;   Laterality: N/A;  mediastinal paraqthyroidectomy   . THYROIDECTOMY  03/09/12    Family History  Problem Relation Age of Onset  . Kidney cancer Neg Hx   . Bladder Cancer Neg Hx   . Prostate cancer Neg Hx     Social History   Social History  . Marital status: Single    Spouse name: N/A  . Number of children: N/A  . Years of education: N/A   Occupational History  . disabled    Social History Main Topics  . Smoking status: Current Every Day Smoker    Packs/day: 1.00    Years: 50.00    Types: Cigarettes  . Smokeless tobacco: Never Used  . Alcohol use Yes     Comment: 03/09/12 "last alcohol 1983"; history of heavy alcohol use  . Drug use: Yes    Types: Marijuana     Comment: 03/09/12 Last drug use "~1978"  . Sexual activity: No   Other Topics Concern  . Not on file   Social History Narrative   Patient is disabled. Patient lives with brother currently. Patient has history of being in prison.   History of alcoholism    No Known Allergies  Outpatient Medications Prior to Visit  Medication Sig Dispense Refill  . amLODipine (NORVASC) 10 MG tablet Take 1 tablet (10 mg total) by mouth daily. 30 tablet 6  . atorvastatin (LIPITOR) 20 MG tablet Take 1 tablet (20 mg total) by mouth daily. 30 tablet 6  . docusate sodium (COLACE) 100 MG capsule Take 1  capsule (100 mg total) by mouth 2 (two) times daily. 60 capsule 6  . ergocalciferol (VITAMIN D2) 50000 units capsule Take 1 capsule (50,000 Units total) by mouth every 6 (six) weeks. 30 capsule 6  . finasteride (PROSCAR) 5 MG tablet Take 1 tablet (5 mg total) by mouth daily. 90 tablet 3  . fluticasone (FLONASE) 50 MCG/ACT nasal spray USE 2 SPRAYS IN EACH NOSTRIL DAILY AS NEEDED 16 g 11  . folic acid (FOLVITE) 1 MG tablet Take 1 tablet (1 mg total) by mouth daily. 30 tablet 6  . glimepiride (AMARYL) 1 MG tablet Take 1 tablet (1 mg total) by mouth daily with breakfast. Add 1mg  to 2mg = 3mg  BID 30 tablet 0  . glimepiride (AMARYL) 2 MG  tablet Take 1 tablet (2 mg total) by mouth 2 (two) times daily. 60 tablet 6  . glucose blood test strip 1 each by Other route 2 (two) times daily. Use as instructed 100 each 5  . labetalol (NORMODYNE) 100 MG tablet Take 1 tablet (100 mg total) by mouth 2 (two) times daily. 60 tablet 6  . Melatonin 1 MG TABS Take 1 tablet (1 mg total) by mouth daily. 30 tablet 6  . metoprolol succinate (TOPROL-XL) 25 MG 24 hr tablet Take 1 tablet (25 mg total) by mouth daily. 30 tablet 6  . Multiple Vitamins-Minerals (MULTIVITAMIN WITH MINERALS) tablet TAKE 1 TABLET BY MOUTH DAILY 30 tablet 3  . OLANZapine (ZYPREXA) 15 MG tablet Take 2 tablets by mouth at bedtime.    . tamsulosin (FLOMAX) 0.4 MG CAPS capsule TAKE 1 CAPSULE BY MOUTH DAILY IN THE MORNING 30 capsule 1  . thiamine (VITAMIN B-1) 100 MG tablet Take 1 tablet (100 mg total) by mouth daily. 30 tablet 6  . TRUEPLUS LANCETS 26G MISC 1 each by Does not apply route 2 (two) times daily. 100 each 5  . polyethylene glycol powder (GLYCOLAX/MIRALAX) powder MIX 17 GRAMS (1 CAPFUL) IN 8 OUNCES WATER & DRINK DAILY 527 g 0   No facility-administered medications prior to visit.     Review of Systems  Constitutional: Negative for chills, fatigue, fever, malaise/fatigue and weight loss.  HENT: Negative for ear discharge, ear pain and sore throat.   Eyes: Negative for blurred vision.  Respiratory: Negative for cough, sputum production, shortness of breath and wheezing.   Cardiovascular: Negative for chest pain, palpitations, leg swelling and near-syncope.  Gastrointestinal: Positive for constipation. Negative for abdominal pain, blood in stool, diarrhea, heartburn, hematochezia, hemorrhoids, melena, nausea and rectal pain.  Genitourinary: Negative for difficulty urinating, dysuria, frequency, hematuria and urgency.  Musculoskeletal: Negative for back pain, joint pain, myalgias and neck pain.  Skin: Negative for pallor and rash.  Neurological: Positive for loss of  balance. Negative for dizziness, tingling, tremors, sensory change, focal weakness, seizures, syncope, speech difficulty, weakness and headaches.  Endo/Heme/Allergies: Negative for environmental allergies, polydipsia and polyphagia. Does not bruise/bleed easily.  Psychiatric/Behavioral: Negative for confusion, depression, memory loss and suicidal ideas. The patient is not nervous/anxious and does not have insomnia.      Objective  Vitals:   04/17/17 1132  BP: 116/64  Pulse: 78  Weight: 146 lb (66.2 kg)  Height: 5\' 8"  (1.727 m)    Physical Exam  Constitutional: He is oriented to person, place, and time and well-developed, well-nourished, and in no distress.  HENT:  Head: Normocephalic.  Right Ear: External ear normal.  Left Ear: External ear normal.  Nose: Nose normal.  Mouth/Throat: Oropharynx is clear and moist.  Eyes: Conjunctivae and EOM are normal. Pupils are equal, round, and reactive to light. Right eye exhibits no discharge. Left eye exhibits no discharge. No scleral icterus.  Neck: Normal range of motion. Neck supple. No JVD present. No tracheal deviation present. No thyromegaly present.  Cardiovascular: Normal rate, regular rhythm, normal heart sounds and intact distal pulses.  Exam reveals no gallop and no friction rub.   No murmur heard. Pulmonary/Chest: Breath sounds normal. No respiratory distress. He has no wheezes. He has no rales.  Abdominal: Soft. Bowel sounds are normal. He exhibits no mass. There is no hepatosplenomegaly. There is no tenderness. There is no rebound, no guarding and no CVA tenderness.  Musculoskeletal: Normal range of motion. He exhibits no edema or tenderness.  Lymphadenopathy:    He has no cervical adenopathy.  Neurological: He is alert and oriented to person, place, and time. He has normal sensation, normal strength, normal reflexes and intact cranial nerves. No cranial nerve deficit.  Skin: Skin is warm. No rash noted.  Psychiatric: Mood and  affect normal.  Nursing note and vitals reviewed.     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 polyethylene glycol powder (GLYCOLAX/MIRALAX) powder    Sig: MIX 17 GRAMS (1 CAPFUL) IN 8 OUNCES WATER & DRINK DAILY    Dispense:  527 g    Refill:  0    Maximum Refills Reached  send to ER    Dr. Otilio Miu [facility]  04/17/17

## 2017-04-17 NOTE — Progress Notes (Deleted)
Alert to self only and unable to participate in the admission process.

## 2017-04-17 NOTE — H&P (Signed)
[facility] [place] NAME: [name]    MR#:  [id number]  DATE OF BIRTH:  September 18, 1946  DATE OF ADMISSION:  04/17/2017  PRIMARY CARE PHYSICIAN: Juline Patch, MD   REQUESTING/REFERRING PHYSICIAN:  Lenise Arena MD  CHIEF COMPLAINT:   Chief Complaint  Patient presents with  . Hyperglycemia    blood sugar >600 at [facility] clinic, BS read high    HISTORY OF PRESENT ILLNESS: [name]  is a 71 y.o. male with a known history of  Alcoholism, schizophrenia, diabetes type 2 and multiple other complaints who was sent from his group home for hyperglycemia. Patient's blood sugars were noted to be greater than 900. Patient is a very poor historian and unable to provide any history. He is on oral regimen for his diabetes. Patient also was noted to have hyperkalemia. Otherwise he has no other complaints  PAST MEDICAL HISTORY:   Past Medical History:  Diagnosis Date  . Alcoholism ([place])   . Allergy   . Altered mental status 2011   hx of 2/2 hypercalcemia, EtOH w/d and medication overuse  . Angina    mild pain on arm, left chest, moves down left chest  . Chronic renal insufficiency    with history of acute on chronic secondary to dehydration and hypercalcemia  . Dementia   . Encephalopathy   . Fx humeral neck 2008   right side, no history of surgical repair  . History of nephrolithiasis 2008   s/p ureterolithotomy   . Hypercalcemia    2/2 primary hyperparathyroidism  . Hypertension   . Pancreatitis 2011   history of several admissions for pancreatitis   . Parathyroid adenoma   . Primary hyperparathyroidism ([place])    s/p parathyroid adenoma resection Oct. 8, 2011, repeat scan showed residual right adenoma, however pt did not f/u with surgery as outpt  . Schizophrenia ([place])    previously on zyprexa  . Tobacco abuse     PAST SURGICAL HISTORY: Past Surgical History:  Procedure Laterality Date  . Ankle Pinning    . cysto, removal of  bladder stone, attempted stent placement  08/15/2007  . cystoscopy, laser cystolithoplaxy  08/16/2007  . KIDNEY STONE SURGERY  07/2007 X2   /E-chart  . open left proximal ureterolithotomy  09/18/2007  . parathyroid adenoma resection  Oct. 8, 2011   residual adenoma Oct. 12, 2011  . PARATHYROIDECTOMY  03/09/2012   Procedure: PARATHYROIDECTOMY;  Surgeon: Pedro Earls, MD;  Location: [facility];  Service: General;  Laterality: N/A;  mediastinal paraqthyroidectomy   . THYROIDECTOMY  03/09/12    SOCIAL HISTORY:  Social History  Substance Use Topics  . Smoking status: Current Every Day Smoker    Packs/day: 1.00    Years: 50.00    Types: Cigarettes  . Smokeless tobacco: Never Used  . Alcohol use Yes     Comment: 03/09/12 "last alcohol 1983"; history of heavy alcohol use    FAMILY HISTORY:  Family History  Problem Relation Age of Onset  . Kidney cancer Neg Hx   . Bladder Cancer Neg Hx   . Prostate cancer Neg Hx     DRUG ALLERGIES: No Known Allergies  REVIEW OF SYSTEMS:   CONSTITUTIONAL: Limited due to patient's mental status No fever, fatigue or weakness.  EYES: No blurred or double vision.  EARS, NOSE, AND THROAT: No tinnitus or ear pain.  RESPIRATORY: No cough, shortness of breath, wheezing or hemoptysis.  CARDIOVASCULAR: No chest pain,  orthopnea, edema.  GASTROINTESTINAL: No nausea, vomiting, diarrhea or abdominal pain.  GENITOURINARY: No dysuria, hematuria.  ENDOCRINE: No polyuria, nocturia,  HEMATOLOGY: No anemia, easy bruising or bleeding SKIN: No rash or lesion. MUSCULOSKELETAL: No joint pain or arthritis.   NEUROLOGIC: No tingling, numbness, weakness.  PSYCHIATRY: No anxiety or depression.   MEDICATIONS AT HOME:  Prior to Admission medications   Medication Sig Start Date End Date Taking? Authorizing Provider  amLODipine (NORVASC) 10 MG tablet Take 1 tablet (10 mg total) by mouth daily. 03/02/17  Yes Juline Patch, MD  atorvastatin (LIPITOR) 20 MG tablet Take 1 tablet  (20 mg total) by mouth daily. 03/02/17  Yes Juline Patch, MD  docusate sodium (COLACE) 100 MG capsule Take 1 capsule (100 mg total) by mouth 2 (two) times daily. 03/02/17  Yes Juline Patch, MD  ergocalciferol (VITAMIN D2) 50000 units capsule Take 1 capsule (50,000 Units total) by mouth every 6 (six) weeks. 03/02/17  Yes Juline Patch, MD  finasteride (PROSCAR) 5 MG tablet Take 1 tablet (5 mg total) by mouth daily. 04/11/17  Yes McGowan, Larene Beach A, PA-C  fluticasone (FLONASE) 50 MCG/ACT nasal spray USE 2 SPRAYS IN EACH NOSTRIL DAILY AS NEEDED 03/02/17  Yes Juline Patch, MD  folic acid (FOLVITE) 1 MG tablet Take 1 tablet (1 mg total) by mouth daily. 03/02/17  Yes Juline Patch, MD  glimepiride (AMARYL) 1 MG tablet Take 1 mg by mouth 2 (two) times daily.   Yes [provider]  glimepiride (AMARYL) 2 MG tablet Take 1 tablet (2 mg total) by mouth 2 (two) times daily. 03/02/17  Yes Juline Patch, MD  labetalol (NORMODYNE) 100 MG tablet Take 1 tablet (100 mg total) by mouth 2 (two) times daily. 03/02/17  Yes Juline Patch, MD  Melatonin 1 MG TABS Take 1 tablet (1 mg total) by mouth daily. 10/05/16  Yes Juline Patch, MD  metoprolol succinate (TOPROL-XL) 25 MG 24 hr tablet Take 1 tablet (25 mg total) by mouth daily. 03/02/17  Yes Juline Patch, MD  Multiple Vitamins-Minerals (MULTIVITAMIN WITH MINERALS) tablet TAKE 1 TABLET BY MOUTH DAILY 04/17/17  Yes Juline Patch, MD  OLANZapine (ZYPREXA) 15 MG tablet Take 2 tablets by mouth at bedtime. 01/27/16  Yes [provider]  tamsulosin (FLOMAX) 0.4 MG CAPS capsule TAKE 1 CAPSULE BY MOUTH DAILY IN THE MORNING 03/21/17  Yes Juline Patch, MD  thiamine (VITAMIN B-1) 100 MG tablet Take 1 tablet (100 mg total) by mouth daily. 03/02/17  Yes Otilio Miu C, MD  glucose blood test strip 1 each by Other route 2 (two) times daily. Use as instructed 10/05/16   Juline Patch, MD  polyethylene glycol powder (GLYCOLAX/MIRALAX) powder MIX 17 GRAMS (1  CAPFUL) IN 8 OUNCES WATER & DRINK DAILY 04/17/17   Juline Patch, MD  TRUEPLUS LANCETS 26G MISC 1 each by Does not apply route 2 (two) times daily. 10/05/16   Juline Patch, MD      PHYSICAL EXAMINATION:   VITAL SIGNS: Blood pressure (!) 130/112, pulse 90, temperature 98.5 F (36.9 C), temperature source Oral, resp. rate 16, height 5\' 8"  (1.727 m), weight 146 lb (66.2 kg).  GENERAL:  71 y.o.-year-old patient lying in the bed with no acute distress.  EYES: Pupils equal, round, reactive to light and accommodation. No scleral icterus. Extraocular muscles intact.  HEENT: Head atraumatic, normocephalic. Oropharynx and nasopharynx clear.  NECK:  Supple, no jugular venous distention. No  thyroid enlargement, no tenderness.  LUNGS: Normal breath sounds bilaterally, no wheezing, rales,rhonchi or crepitation. No use of accessory muscles of respiration.  CARDIOVASCULAR: S1, S2 normal. No murmurs, rubs, or gallops.  ABDOMEN: Soft, nontender, nondistended. Bowel sounds present. No organomegaly or mass.  EXTREMITIES: No pedal edema, cyanosis, or clubbing.  NEUROLOGIC: Cranial nerves II through XII are intact. Muscle strength 5/5 in all extremities. Sensation intact. Gait not checked.  PSYCHIATRIC: The patient is alert and oriented x 3.  SKIN: No obvious rash, lesion, or ulcer.   LABORATORY PANEL:   CBC  Recent Labs Lab 04/17/17 1448  WBC 6.9  HGB 14.1  HCT 43.2  PLT 219  MCV 91.3  MCH 29.8  MCHC 32.6  RDW 14.4  LYMPHSABS 1.4  MONOABS 0.8  EOSABS 0.0  BASOSABS 0.0   ------------------------------------------------------------------------------------------------------------------  Chemistries   Recent Labs Lab 04/17/17 1448  NA 128*  K 5.8*  CL 96*  CO2 20*  GLUCOSE 945*  BUN 72*  CREATININE 3.18*  CALCIUM 11.5*  AST 20  ALT 44  ALKPHOS 94  BILITOT 0.9    ------------------------------------------------------------------------------------------------------------------ estimated creatinine clearance is 20.5 mL/min (A) (by C-G formula based on SCr of 3.18 mg/dL (H)). ------------------------------------------------------------------------------------------------------------------ No results for input(s): TSH, T4TOTAL, T3FREE, THYROIDAB in the last 72 hours.  Invalid input(s): FREET3   Coagulation profile No results for input(s): INR, PROTIME in the last 168 hours. ------------------------------------------------------------------------------------------------------------------- No results for input(s): DDIMER in the last 72 hours. -------------------------------------------------------------------------------------------------------------------  Cardiac Enzymes  Recent Labs Lab 04/17/17 1448  TROPONINI <0.03   ------------------------------------------------------------------------------------------------------------------ Invalid input(s): POCBNP  ---------------------------------------------------------------------------------------------------------------  Urinalysis    Component Value Date/Time   COLORURINE YELLOW 04/24/2013 1236   APPEARANCEUR CLOUDY (A) 04/24/2013 1236   LABSPEC 1.012 04/24/2013 1236   PHURINE 6.0 04/24/2013 1236   GLUCOSEU NEGATIVE 04/24/2013 1236   HGBUR SMALL (A) 04/24/2013 1236   BILIRUBINUR NEGATIVE 04/24/2013 1236   KETONESUR NEGATIVE 04/24/2013 1236   PROTEINUR 30 (A) 04/24/2013 1236   UROBILINOGEN 0.2 04/24/2013 1236   NITRITE NEGATIVE 04/24/2013 1236   LEUKOCYTESUR LARGE (A) 04/24/2013 1236     RADIOLOGY: No results found.  EKG: Orders placed or performed during the hospital encounter of 04/17/17  . EKG 12-Lead  . EKG 12-Lead    IMPRESSION AND PLAN: Patient is a 71 year old African-American male with severe hyperglycemia  1. Severe hyperglycemia At this time patient started on  insulin drip which we will continue. I will check a hemoglobin A1c He will need insulin on discharge  2. Hyperkalemia give him a dose of Kayexalate  3. Essential hypertension continue amlodipine and labetalol will use IV when necessary hydralazine Patient on both metoprolol and labetalol if his heart rate drops would stop one of those medications  4. Schizophrenia continue his home regimen  5. Hyponatremia due to severe hypoglycemia   [facility] has been notified of patient's admission  All the records are reviewed and case discussed with ED provider. Management plans discussed with the patient, family and they are in agreement.  CODE STATUS: Code Status History    Date Active Date Inactive Code Status Order ID Comments User Context   09/10/2013  2:20 PM 09/12/2013  4:09 PM Full Code [id number]  Hoy Morn, MD ED   03/09/2012 12:56 PM 03/10/2012  5:52 PM Full Code [id number]  Midge Aver, RN Inpatient   01/05/2012 12:54 AM 01/07/2012  9:17 PM Full Code [id number]  Tawanna Solo, RN Inpatient   11/09/2011 10:30 AM 11/14/2011  5:35  PM Full Code [id number]  Cheryln Manly, RN ED       TOTAL TIME TAKING CARE OF THIS PATIENT:55 minutes. critical care time spent   Dustin Flock M.D on 04/17/2017 at 4:57 PM  Between 7am to 6pm - Pager - [phone]  After 6pm go to [email or web address] - password [email or web address]  Office  [phone]  CC: Primary care physician; Juline Patch, MD

## 2017-04-17 NOTE — ED Provider Notes (Signed)
[facility] Emergency Department Provider Note       Time seen: ----------------------------------------- 2:11 PM on 04/17/2017 -----------------------------------------  L5 caveat: Review of systems and history is limited by dementia   I have reviewed the triage vital signs and the nursing notes.   HISTORY   Chief Complaint Hyperglycemia (blood sugar >600 at [facility] clinic, BS read high)    HPI [name] is a 71 y.o. male who presents to the ED for hyperglycemia. Patient arrives with a group home member stating his blood sugar was greater than 600. Patient denies any complaints at this time. Patient is listed as a type II diabetic.   Past Medical History:  Diagnosis Date  . Alcoholism ([place])   . Allergy   . Altered mental status 2011   hx of 2/2 hypercalcemia, EtOH w/d and medication overuse  . Angina    mild pain on arm, left chest, moves down left chest  . Chronic renal insufficiency    with history of acute on chronic secondary to dehydration and hypercalcemia  . Dementia   . Encephalopathy   . Fx humeral neck 2008   right side, no history of surgical repair  . History of nephrolithiasis 2008   s/p ureterolithotomy   . Hypercalcemia    2/2 primary hyperparathyroidism  . Hypertension   . Pancreatitis 2011   history of several admissions for pancreatitis   . Parathyroid adenoma   . Primary hyperparathyroidism ([place])    s/p parathyroid adenoma resection Oct. 8, 2011, repeat scan showed residual right adenoma, however pt did not f/u with surgery as outpt  . Schizophrenia ([place])    previously on zyprexa  . Tobacco abuse     Patient Active Problem List   Diagnosis Date Noted  . Folic acid deficiency 46/27/0350  . Chronic constipation 03/02/2017  . Thiamine deficiency 03/02/2017  . Chronic seasonal allergic rhinitis due to pollen 03/02/2017  . Type 2 diabetes mellitus without complication, without long-term current use of insulin  ([place]) 03/02/2017  . Lives in long-term care facility 03/02/2017  . Hyperlipidemia 03/02/2017  . Vitamin D deficiency 03/02/2017  . Anemia 03/02/2017  . Encephalopathy, metabolic 09/38/1829  . CKD (chronic kidney disease) stage 4, GFR 15-29 ml/min (HCC) 01/05/2012  . Tobacco abuse 01/05/2012  . Metabolic acidosis 93/71/6967  . Pancreatitis, hx of  11/09/2011  . Schizophrenia ([place]) 11/09/2011  . History of nephrolithiasis 11/09/2011  . 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 Hypertension 11/09/2011  . Alcohol abuse 11/09/2011  . Altered mental status 11/09/2011  . UTI (lower urinary tract infection) 11/09/2011  . Acute on chronic renal insufficiency 11/09/2011    Past Surgical History:  Procedure Laterality Date  . Ankle Pinning    . cysto, removal of bladder stone, attempted stent placement  08/15/2007  . cystoscopy, laser cystolithoplaxy  08/16/2007  . KIDNEY STONE SURGERY  07/2007 X2   /E-chart  . open left proximal ureterolithotomy  09/18/2007  . parathyroid adenoma resection  Oct. 8, 2011   residual adenoma Oct. 12, 2011  . PARATHYROIDECTOMY  03/09/2012   Procedure: PARATHYROIDECTOMY;  Surgeon: Pedro Earls, MD;  Location: [place];  Service: General;  Laterality: N/A;  mediastinal paraqthyroidectomy   . THYROIDECTOMY  03/09/12    Allergies Patient has no known allergies.  Social History Social History  Substance Use Topics  . Smoking status: Current Every Day Smoker    Packs/day: 1.00    Years: 50.00    Types: Cigarettes  .  Smokeless tobacco: Never Used  . Alcohol use Yes     Comment: 03/09/12 "last alcohol 1983"; history of heavy alcohol use    Review of Systems Unknown, patient denies complaints at this time.  All systems negative/normal/unremarkable except as stated in the HPI  ____________________________________________   PHYSICAL EXAM:  VITAL SIGNS: ED Triage Vitals [04/17/17 1348]  Enc Vitals Group     BP (!) 130/112     Pulse Rate 90      Resp 16     Temp 98.5 F (36.9 C)     Temp Source Oral     SpO2      Weight 146 lb (66.2 kg)     Height 5\' 8"  (1.727 m)     Head Circumference      Peak Flow      Pain Score      Pain Loc      Pain Edu?      Excl. in [place]?     Constitutional: AlertBut disoriented, no acute distress Eyes: Conjunctivae are normal.  Normal extraocular movements. ENT   Head: Normocephalic and atraumatic.   Nose: No congestion/rhinnorhea.   Mouth/Throat: Mucous membranes are moist.   Neck: No stridor. Cardiovascular: Normal rate, regular rhythm. No murmurs, rubs, or gallops. Respiratory: Normal respiratory effort without tachypnea nor retractions. Breath sounds are clear and equal bilaterally. No wheezes/rales/rhonchi. Gastrointestinal: Soft and nontender. Normal bowel sounds Musculoskeletal: Nontender with normal range of motion in extremities. No lower extremity tenderness nor edema. Neurologic:  No gross focal neurologic deficits are appreciated.  Skin:  Skin is warm, dry and intact. No rash noted. Psychiatric: Flat affect ____________________________________________  EKG: Interpreted by me.Sinus rhythm with rate of 69 bpm, normal PR normal, wide QRS, normal QT.  ____________________________________________  ED COURSE:  Pertinent labs & imaging results that were available during my care of the patient were reviewed by me and considered in my medical decision making (see chart for details). Patient presents for hyperglycemia, we will assess with labs and imaging as indicated.   Procedures ____________________________________________   LABS (pertinent positives/negatives)  Labs Reviewed  GLUCOSE, CAPILLARY - Abnormal; Notable for the following:       Result Value   Glucose-Capillary >600 (*)    All other components within normal limits  COMPREHENSIVE METABOLIC PANEL - Abnormal; Notable for the following:    Sodium 128 (*)    Potassium 5.8 (*)    Chloride 96 (*)    CO2  20 (*)    Glucose, Bld 945 (*)    BUN 72 (*)    Creatinine, Ser 3.18 (*)    Calcium 11.5 (*)    Total Protein 8.3 (*)    GFR calc non Af Amer 18 (*)    GFR calc Af Amer 21 (*)    All other components within normal limits  CBC WITH DIFFERENTIAL/PLATELET  TROPONIN I  URINALYSIS, COMPLETE (UACMP) WITH MICROSCOPIC  CBG MONITORING, ED   CRITICAL CARE Performed by: Earleen Newport   Total critical care time: 30 minutes  Critical care time was exclusive of separately billable procedures and treating other patients.  Critical care was necessary to treat or prevent imminent or life-threatening deterioration.  Critical care was time spent personally by me on the following activities: development of treatment plan with patient and/or surrogate as well as nursing, discussions with consultants, evaluation of patient's response to treatment, examination of patient, obtaining history from patient or surrogate, ordering and performing treatments and interventions, ordering and  review of laboratory studies, ordering and review of radiographic studies, pulse oximetry and re-evaluation of patient's condition.  ____________________________________________  FINAL ASSESSMENT AND PLAN  Hyperglycemia and hyperosmolar state, urinary retention  Plan: Patient's labs and imaging were dictated above. Patient had presented for elevated blood sugars. Fingerstick here at critical high, serum glucose was around 900. He was started on insulin drip and was given IV fluids. It also ordered Kayexalate, he does not require Ca as his calcium was already elevated. We also placed a Foley catheter because he was retaining urine which may be worsening his renal function. He will require step down admission   Earleen Newport, MD   Note: This note was generated in part or whole with voice recognition software. Voice recognition is usually quite accurate but there are transcription errors that can and very often  do occur. I apologize for any typographical errors that were not detected and corrected.     Earleen Newport, MD 04/17/17 [phone]

## 2017-04-18 DIAGNOSIS — R739 Hyperglycemia, unspecified: Secondary | ICD-10-CM | POA: Diagnosis not present

## 2017-04-18 DIAGNOSIS — E1100 Type 2 diabetes mellitus with hyperosmolarity without nonketotic hyperglycemic-hyperosmolar coma (NKHHC): Secondary | ICD-10-CM | POA: Diagnosis not present

## 2017-04-18 LAB — GLUCOSE, CAPILLARY
GLUCOSE-CAPILLARY: 153 mg/dL — AB (ref 65–99)
GLUCOSE-CAPILLARY: 156 mg/dL — AB (ref 65–99)
GLUCOSE-CAPILLARY: 170 mg/dL — AB (ref 65–99)
GLUCOSE-CAPILLARY: 173 mg/dL — AB (ref 65–99)
GLUCOSE-CAPILLARY: 235 mg/dL — AB (ref 65–99)
GLUCOSE-CAPILLARY: 414 mg/dL — AB (ref 65–99)
GLUCOSE-CAPILLARY: 437 mg/dL — AB (ref 65–99)
Glucose-Capillary: 176 mg/dL — ABNORMAL HIGH (ref 65–99)
Glucose-Capillary: 142 mg/dL — ABNORMAL HIGH (ref 65–99)
Glucose-Capillary: 128 mg/dL — ABNORMAL HIGH (ref 65–99)

## 2017-04-18 LAB — CBC
HEMATOCRIT: 38.3 % — AB (ref 40.0–52.0)
Hemoglobin: 13.2 g/dL (ref 13.0–18.0)
MCH: 30.4 pg (ref 26.0–34.0)
MCHC: 34.4 g/dL (ref 32.0–36.0)
MCV: 88.3 fL (ref 80.0–100.0)
PLATELETS: 240 10*3/uL (ref 150–440)
RBC: 4.34 MIL/uL — ABNORMAL LOW (ref 4.40–5.90)
RDW: 13.8 % (ref 11.5–14.5)
WBC: 8.8 10*3/uL (ref 3.8–10.6)

## 2017-04-18 LAB — BASIC METABOLIC PANEL
Anion gap: 8 (ref 5–15)
BUN: 56 mg/dL — ABNORMAL HIGH (ref 6–20)
CHLORIDE: 113 mmol/L — AB (ref 101–111)
CO2: 22 mmol/L (ref 22–32)
CREATININE: 2.61 mg/dL — AB (ref 0.61–1.24)
Calcium: 10.7 mg/dL — ABNORMAL HIGH (ref 8.9–10.3)
GFR, EST AFRICAN AMERICAN: 27 mL/min — AB (ref >60)
GFR, EST NON AFRICAN AMERICAN: 23 mL/min — AB (ref >60)
Glucose, Bld: 139 mg/dL — ABNORMAL HIGH (ref 65–99)
Potassium: 3.1 mmol/L — ABNORMAL LOW (ref 3.5–5.1)
SODIUM: 143 mmol/L (ref 135–145)

## 2017-04-18 MED ORDER — INSULIN ASPART 100 UNIT/ML ~~LOC~~ SOLN: 0.0000 [IU] | Freq: Every day | SUBCUTANEOUS | Status: DC

## 2017-04-18 MED ORDER — INSULIN ASPART 100 UNIT/ML ~~LOC~~ SOLN
0.0000 [IU] | Freq: Three times a day (TID) | SUBCUTANEOUS | Status: DC
Start: 2017-04-18 — End: 2017-04-19
  Administered 2017-04-18: 2 [IU] via SUBCUTANEOUS
  Administered 2017-04-18: 3 [IU] via SUBCUTANEOUS
  Administered 2017-04-19: 9 [IU] via SUBCUTANEOUS
  Administered 2017-04-19: 7 [IU] via SUBCUTANEOUS
  Filled 2017-04-18: qty 7
  Filled 2017-04-18: qty 9
  Filled 2017-04-18: qty 2
  Filled 2017-04-18: qty 3

## 2017-04-18 MED ORDER — POTASSIUM CHLORIDE IN NACL 20-0.45 MEQ/L-% IV SOLN
INTRAVENOUS | Status: DC
Start: 2017-04-18 — End: 2017-04-19
  Administered 2017-04-18: 11:00:00 via INTRAVENOUS
  Filled 2017-04-18 (×3): qty 1000

## 2017-04-18 MED ORDER — INSULIN DETEMIR 100 UNIT/ML ~~LOC~~ SOLN
6.0000 [IU] | Freq: Every day | SUBCUTANEOUS | Status: DC
  Administered 2017-04-18: 6 [IU] via SUBCUTANEOUS
  Filled 2017-04-18 (×2): qty 0.06

## 2017-04-18 MED ORDER — GLIMEPIRIDE 2 MG PO TABS
2.0000 mg | ORAL_TABLET | Freq: Two times a day (BID) | ORAL | Status: DC
  Administered 2017-04-18: 2 mg via ORAL
  Filled 2017-04-18 (×2): qty 1

## 2017-04-18 MED ORDER — INSULIN ASPART 100 UNIT/ML ~~LOC~~ SOLN
0.0000 [IU] | Freq: Three times a day (TID) | SUBCUTANEOUS | Status: DC
  Administered 2017-04-18: 15 [IU] via SUBCUTANEOUS
  Filled 2017-04-18: qty 15

## 2017-04-18 MED ORDER — MELATONIN 5 MG PO TABS
2.5000 mg | ORAL_TABLET | ORAL | Status: DC
  Administered 2017-04-18: 2.5 mg via ORAL
  Filled 2017-04-18 (×3): qty 0.5

## 2017-04-18 NOTE — Progress Notes (Signed)
Inpatient Diabetes Program Recommendations  AACE/ADA: New Consensus Statement on Inpatient Glycemic Control (2015)  Target Ranges:  Prepandial:   less than 140 mg/dL      Peak postprandial:   less than 180 mg/dL (1-2 hours)      Critically ill patients:  140 - 180 mg/dL   Lab Results  Component Value Date   GLUCAP 235 (H) 04/18/2017   HGBA1C 9.6 (H) 03/08/2017    Review of Glycemic Control:  Results for ROTH, RESS (MRN [id number]) as of 04/18/2017 15:40  Ref. Range 04/17/2017 23:00 04/18/2017 00:01 04/18/2017 00:52 04/18/2017 02:03 04/18/2017 03:08 04/18/2017 04:03 04/18/2017 05:12 04/18/2017 07:16 04/18/2017 12:49  Glucose-Capillary Latest Ref Range: 65 - 99 mg/dL 154 (H) 148 (H) 156 (H) 176 (H) 142 (H) 153 (H) 128 (H) 173 (H) 235 (H)    Diabetes history: Type 2 diabetes Outpatient Diabetes medications: Amaryl 2 mg bid Current orders for Inpatient glycemic control:  Novolog sensitive tid with meals and HS, Amaryl 2 mg bid Inpatient Diabetes Program Recommendations:    Spoke with RN Santiago Glad.  She states that patient unable to be d/c'd home on insulin due to living at group home.  I am unclear as to who administers patients medications at the home.  Attempted to speak with patient regarding home management of diabetes, however he did not seem to understand my questions.  He did say that he likes soda and does not like water.  Note that home health is ordered as well.  Note current plans to restart oral medications.  A1C in April was 9.6%.  Note that Creatinine=2.61 therefore unable to be on metformin.  Hopefully blood sugars can be controlled with orals/diet alone.  Will follow.  Thanks, Adah Perl, RN, BC-ADM Inpatient Diabetes Coordinator Pager [phone] (8a-5p)

## 2017-04-18 NOTE — Progress Notes (Addendum)
[place] [facility] [place] NAME: [name]    MR#:  [id number]  DATE OF BIRTH:  [birth year]  SUBJECTIVE:   Poor historian. Came in with elevated sugars and found to have BG of 900 REVIEW OF SYSTEMS:   Review of Systems  Unable to perform ROS: Mental acuity   Tolerating Diet: Tolerating PT:   DRUG ALLERGIES:  No Known Allergies  VITALS:  Blood pressure 134/83, pulse 68, temperature 98.2 F (36.8 C), temperature source Oral, resp. rate 20, height 5\' 8"  (1.727 m), weight 65.8 kg (145 lb 1 oz), SpO2 100 %.  PHYSICAL EXAMINATION:   Physical Exam  GENERAL:  71 y.o.-year-old patient lying in the bed with no acute distress.  EYES: Pupils equal, round, reactive to light and accommodation. No scleral icterus. Extraocular muscles intact.  HEENT: Head atraumatic, normocephalic. Oropharynx and nasopharynx clear.  NECK:  Supple, no jugular venous distention. No thyroid enlargement, no tenderness.  LUNGS: Normal breath sounds bilaterally, no wheezing, rales, rhonchi. No use of accessory muscles of respiration.  CARDIOVASCULAR: S1, S2 normal. No murmurs, rubs, or gallops.  ABDOMEN: Soft, nontender, nondistended. Bowel sounds present. No organomegaly or mass.  EXTREMITIES: No cyanosis, clubbing or edema b/l.    NEUROLOGIC: Cranial nerves II through XII are intact. No focal Motor or sensory deficits b/l.   PSYCHIATRIC:  patient is alert and oriented x 3.  SKIN: No obvious rash, lesion, or ulcer.   LABORATORY PANEL:  CBC  Recent Labs Lab 04/18/17 0551  WBC 8.8  HGB 13.2  HCT 38.3*  PLT 240    Chemistries   Recent Labs Lab 04/17/17 1448  04/18/17 0551  NA 128*  < > 143  K 5.8*  < > 3.1*  CL 96*  < > 113*  CO2 20*  < > 22  GLUCOSE 945*  < > 139*  BUN 72*  < > 56*  CREATININE 3.18*  < > 2.61*  CALCIUM 11.5*  < > 10.7*  AST 20  --   --   ALT 44  --   --   ALKPHOS 94  --   --   BILITOT 0.9  --   --   < > = values in this  interval not displayed. Cardiac Enzymes  Recent Labs Lab 04/17/17 1448  TROPONINI <0.03   RADIOLOGY:  No results found. ASSESSMENT AND PLAN:  [name]  is a 71 y.o. male with a known history of  Alcoholism, schizophrenia, diabetes type 2 and multiple other complaints who was sent from his group home for hyperglycemia. Patient's blood sugars were noted to be greater than 900. Patient is a very poor historian and unable to provide any history. He is on oral regimen for his diabetes.  1. Severe Uncontrolled dm-2 -now off  insulin drip  -received ivF -resume amaryl 2 mg bid. -pt is from group home. D/c lantus. If sugars remain high then discuss with group home owner if they are able to do insulin for pt -a1c is 9.6 -cont ssi  2. Hyperkalemia -resolved  3. Essential hypertension continue amlodipine and labetalol  - IV when necessary hydralazine Patient on both metoprolol and labetalol if his heart rate drops would stop one of those medications  4. Schizophrenia continue his home regimen  5. Hyponatremia due to severe hypoglycemia -resolved  CSW for d/c planing PT to see pt Case discussed with Care Management/Social Worker. Management plans discussed with the patient, family and they are  in agreement.  CODE STATUS: Full  DVT Prophylaxis: heparin  TOTAL TIME TAKING CARE OF THIS PATIENT: *30* minutes.  >50% time spent on counselling and coordination of care  POSSIBLE D/C IN 1-2 DAYS, DEPENDING ON CLINICAL CONDITION.  Note: This dictation was prepared with Dragon dictation along with smaller phrase technology. Any transcriptional errors that result from this process are unintentional.  Kieffer Blatz M.D on 04/18/2017 at 1:48 PM  Between 7am to 6pm - Pager - [phone]  After 6pm go to [email or web address] - password [email or web address] [facility]  Office  [phone]  CC: Primary care physician; Juline Patch, MD

## 2017-04-18 NOTE — Evaluation (Signed)
Physical Therapy Evaluation Patient Details Name: [name] MRN: [id number] DOB: 1946-07-03 Today's Date: 04/18/2017   History of Present Illness  Pt is a 71 y.o. male presenting to hospital with blood sugar >900 and pt started on insulin drip and transferred to step down unit.  Pt admitted with hyperglycemia and hyperkalemia.  PMH includes alcoholism, AMS, angina, h/o R humeral neck fx, htn, dementia, schizophrenia, chronic renal insufficiency.  Clinical Impression  Prior to hospital admission, per chart pt was ambulatory.  Pt lives at a group home.  Currently pt is modified independent with bed mobility and CGA with transfers and ambulation 350 feet (no assistive device).  Pt initially with decreased B step length and mildly unsteady/"shaky" during ambulation but improved step length and steadiness noted with distance (pt CGA at most during session without any overt loss of balance requiring assist to steady).  Pt would benefit from skilled PT to address noted impairments and functional limitations during hospital stay (see below for any additional details).  Upon hospital discharge, recommend pt discharge back to group home; no further PT needs anticipated upon hospital discharge.    Follow Up Recommendations No PT follow up    Equipment Recommendations  None recommended by PT    Recommendations for Other Services       Precautions / Restrictions Precautions Precautions: Fall Restrictions Weight Bearing Restrictions: No      Mobility  Bed Mobility Overal bed mobility: Modified Independent             General bed mobility comments: Supine to/from sit with HOB elevated without any difficulties.  Transfers Overall transfer level: Needs assistance Equipment used: None Transfers: Sit to/from Stand Sit to Stand: Min guard         General transfer comment: strong stand; steady without loss of balance  Ambulation/Gait Ambulation/Gait assistance: Min  guard Ambulation Distance (Feet): 350 Feet Assistive device: None   Gait velocity: decreased but improved cadence with distance   General Gait Details: pt initially with decreased B step length and mildly unsteady/"shaky" but improved step length and steadiness noted with distance  Stairs            Wheelchair Mobility    Modified Rankin (Stroke Patients Only)       Balance Overall balance assessment: Needs assistance Sitting-balance support: No upper extremity supported;Feet supported Sitting balance-Leahy Scale: Good Sitting balance - Comments: sitting reaching within BOS   Standing balance support: No upper extremity supported Standing balance-Leahy Scale: Good Standing balance comment: standing attempting to urinate in toilet steady without loss of balance                             Pertinent Vitals/Pain Pain Assessment: No/denies pain    Home Living Family/patient expects to be discharged to:: Group home                 Additional Comments: Pt reports no steps to enter.    Prior Function           Comments: Pt providing limited information during session when asked but per chart review pt lives in a Group home and is [place].  Pt was ambulatory but required assist with ADL's.     Hand Dominance        Extremity/Trunk Assessment   Upper Extremity Assessment Upper Extremity Assessment: Generalized weakness    Lower Extremity Assessment Lower Extremity Assessment: Generalized weakness  Cervical / Trunk Assessment Cervical / Trunk Assessment: Normal  Communication   Communication:  (Increased time to respond to therapist)  Cognition Arousal/Alertness: Awake/alert Behavior During Therapy: Flat affect Overall Cognitive Status: No family/caregiver present to determine baseline cognitive functioning (Oriented to name and DOB (pt did not answer any other orientation question))                                         General Comments General comments (skin integrity, edema, etc.): Pt sleeping in bed upon PT arrival.  Nursing cleared pt for participation in physical therapy.  Pt agreeable to PT session.    Exercises     Assessment/Plan    PT Assessment Patient needs continued PT services  PT Problem List Decreased strength;Decreased balance;Decreased mobility       PT Treatment Interventions Gait training;Functional mobility training;Therapeutic activities;Therapeutic exercise;Balance training;Patient/family education    PT Goals (Current goals can be found in the Care Plan section)  Acute Rehab PT Goals Patient Stated Goal: to be able to walk more PT Goal Formulation: With patient Time For Goal Achievement: 05/02/17 Potential to Achieve Goals: Good    Frequency Min 2X/week   Barriers to discharge        Co-evaluation               AM-PAC PT "6 Clicks" Daily Activity  Outcome Measure Difficulty turning over in bed (including adjusting bedclothes, sheets and blankets)?: None Difficulty moving from lying on back to sitting on the side of the bed? : None Difficulty sitting down on and standing up from a chair with arms (e.g., wheelchair, bedside commode, etc,.)?: A Little Help needed moving to and from a bed to chair (including a wheelchair)?: A Little Help needed walking in hospital room?: A Little Help needed climbing 3-5 steps with a railing? : A Little 6 Click Score: 20    End of Session Equipment Utilized During Treatment: Gait belt Activity Tolerance: Patient tolerated treatment well Patient left: in bed;with call bell/phone within reach;with bed alarm set Nurse Communication: Mobility status;Precautions PT Visit Diagnosis: Unsteadiness on feet (R26.81);Muscle weakness (generalized) (M62.81)    Time: 3295-1884 PT Time Calculation (min) (ACUTE ONLY): 18 min   Charges:   PT Evaluation $PT Eval Low Complexity: 1 Procedure     PT G CodesLeitha Bleak, PT 04/18/17, 4:34 PM [phone]

## 2017-04-18 NOTE — Care Management Note (Addendum)
Case Management Note  Patient Details  Name: [name] MRN: [id number] Date of Birth: 03-14-1946  Subjective/Objective:                  Spoke by phone with owner [name] [phone] of [facility] where patient resides. Patient is able to walk some however they assist him with ADLs. He is not on Insulin at baseline and Timmy admits that there will be education needed to him and his team. He would like home health services through [facility] for continued teaching. Timmy states that they are able to check his/patient's blood sugars. Patient is a ward of state -[place], [phone]. Patient is not on O2 at home and does not have any ambulatory aide.  Action/Plan: [facility]erral to [facility]. [facility] resource shared with Timmy and placed on discharge follow up. DM RN consult in place. RNCM will continue to follow.   Expected Discharge Date:                  Expected Discharge Plan:     In-House Referral:  Clinical Social Work  Discharge planning Services  CM Consult  Post Acute Care Choice:    Choice offered to:     DME Arranged:    DME Agency:     HH Arranged:    HH Agency:     Status of Service:     If discussed at [employer], dates discussed:    Additional Comments:  Marshell Garfinkel, RN 04/18/2017, 8:43 AM

## 2017-04-18 NOTE — Clinical Social Work Note (Signed)
Clinical Social Work Assessment  Patient Details  Name: [name] MRN: [id number] Date of Birth: 28-Aug-1946  Date of referral:  04/18/17               Reason for consult:  Facility Placement                Permission sought to share information with:    Permission granted to share information::     Name::        Agency::     Relationship::     Contact Information:     Housing/Transportation Living arrangements for the past 2 months:  [place] of Information:  Patient Patient Interpreter Needed:  None Criminal Activity/Legal Involvement Pertinent to Current Situation/Hospitalization:  No - Comment as needed Significant Relationships:   ([place]: [place]: [phone]) Lives with:  Self Do you feel safe going back to the place where you live?    Need for family participation in patient care:     Care giving concerns:  Patient is a resident at [facility].   Social Worker assessment / plan:  RN CM has spoken to patient's group home owner: [name]: [phone] and he has stated that he will take patient back at discharge. CSW has attempted to reach the [place] guardian [name]) listed for patient however, the number for her is in correct and the main number to DSS kept me on hold indefinitely. CSW will continue to try and reach the guardian.   Employment status:  Disabled (Comment on whether or not currently receiving Disability) Insurance information:  Medicare PT Recommendations:    Information / Referral to community resources:     Patient/Family's Response to care:  CSW unable to reach guardian as of yet for patient.  Patient/Family's Understanding of and Emotional Response to Diagnosis, Current Treatment, and Prognosis:  CSW unable to reach guardian as of yet for patient.  Emotional Assessment Appearance:    Attitude/Demeanor/Rapport:    Affect (typically observed):    Orientation:    Alcohol  / Substance use:  Alcohol Use (history of heavy alcohol abuse) Psych involvement (Current and /or in the community):   (has schizophrenia)  Discharge Needs  Concerns to be addressed:  Care Coordination Readmission within the last 30 days:  No Current discharge risk:  None Barriers to Discharge:      Seth Leff, LCSW 04/18/2017, 3:07 PM

## 2017-04-19 DIAGNOSIS — E1100 Type 2 diabetes mellitus with hyperosmolarity without nonketotic hyperglycemic-hyperosmolar coma (NKHHC): Secondary | ICD-10-CM | POA: Diagnosis not present

## 2017-04-19 DIAGNOSIS — R739 Hyperglycemia, unspecified: Secondary | ICD-10-CM | POA: Diagnosis not present

## 2017-04-19 LAB — GLUCOSE, CAPILLARY
GLUCOSE-CAPILLARY: 380 mg/dL — AB (ref 65–99)
Glucose-Capillary: 319 mg/dL — ABNORMAL HIGH (ref 65–99)

## 2017-04-19 LAB — BASIC METABOLIC PANEL
ANION GAP: 6 (ref 5–15)
BUN: 48 mg/dL — ABNORMAL HIGH (ref 6–20)
CHLORIDE: 109 mmol/L (ref 101–111)
CO2: 23 mmol/L (ref 22–32)
Calcium: 10.3 mg/dL (ref 8.9–10.3)
Creatinine, Ser: 2.6 mg/dL — ABNORMAL HIGH (ref 0.61–1.24)
GFR calc Af Amer: 27 mL/min — ABNORMAL LOW (ref >60)
GFR, EST NON AFRICAN AMERICAN: 24 mL/min — AB (ref >60)
Glucose, Bld: 286 mg/dL — ABNORMAL HIGH (ref 65–99)
POTASSIUM: 4 mmol/L (ref 3.5–5.1)
SODIUM: 138 mmol/L (ref 135–145)

## 2017-04-19 LAB — HEMOGLOBIN A1C: Hgb A1c MFr Bld: >15.5 % — ABNORMAL HIGH (ref 4.8–5.6)

## 2017-04-19 MED ORDER — INSULIN GLARGINE 100 UNIT/ML ~~LOC~~ SOLN: 10.0000 [IU] | Freq: Every day | SUBCUTANEOUS | 11 refills | Status: DC

## 2017-04-19 MED ORDER — GLIMEPIRIDE 2 MG PO TABS
4.0000 mg | ORAL_TABLET | Freq: Two times a day (BID) | ORAL | Status: DC
  Filled 2017-04-19: qty 1

## 2017-04-19 MED ORDER — INSULIN GLARGINE 100 UNIT/ML ~~LOC~~ SOLN
10.0000 [IU] | Freq: Every day | SUBCUTANEOUS | Status: DC
  Administered 2017-04-19: 10 [IU] via SUBCUTANEOUS
  Filled 2017-04-19 (×2): qty 0.1

## 2017-04-19 MED ORDER — GLIMEPIRIDE 4 MG PO TABS: 4.0000 mg | ORAL_TABLET | Freq: Two times a day (BID) | ORAL | 0 refills | Status: DC

## 2017-04-19 MED ORDER — PIOGLITAZONE HCL 30 MG PO TABS
30.0000 mg | ORAL_TABLET | Freq: Every day | ORAL | Status: DC
  Administered 2017-04-19: 30 mg via ORAL
  Filled 2017-04-19: qty 1

## 2017-04-19 NOTE — Clinical Social Work Note (Signed)
Patient discharging today to return to [facility]. Patient's family care home to transport. Multiple attempts have been made to get in touch with [place] guardian without success. Shela Leff MSW,LCSW [phone]

## 2017-04-19 NOTE — Discharge Summary (Signed)
[facility] [place] [facility], 71 y.o., DOB 11/19/1946, MRN [id number]. Admission date: 04/17/2017 Discharge Date 04/19/2017 Primary MD Juline Patch, MD Admitting Physician Dustin Flock, MD  Admission Diagnosis  Diabetes mellitus with hyperosmolarity without hyperglycemic hyperosmolar nonketotic coma ([place]) [E11.00]  Discharge Diagnosis   Active Problems:   Hyperglycemia Hyperkalemia Essential hypertension Schizophrenia Hyponatremia due to hypoglycemia     [place]  is a 71 y.o. male with a known history of  Alcoholism, schizophrenia, diabetes type 2 and multiple other complaints who was sent from his group home for hyperglycemia. Patient's blood sugars were noted to be greater than 900. Patient is a very poor historian and unable to provide any history. He is on oral regimen for his diabetes. Patient also was noted to have hyperkalemia. He was started on insulin drip. His blood sugars improved significantly. He was also noticed to have hyperkalemia. Patient's doing much better and is close to baseline. He'll be started on a low-dose insulin he'll need referral to endocrinology.          Consults endocronlogy  Significant Tests:  See full reports for all details     Dg Abd 1 View  Result Date: 04/11/2017 CLINICAL DATA:  Right flank pain. EXAM: ABDOMEN - 1 VIEW COMPARISON:  CT 08/15/2010. FINDINGS: Large amount stool noted throughout the colon rectum. Constipation cannot be excluded. No bowel distention . Pelvic calcifications noted consistent phleboliths. Similar findings noted on prior exam. Aortoiliac atherosclerotic vascular calcification. Degenerative changes lumbar spine and both hips . IMPRESSION: 1. Large amount stool noted throughout the colon rectum. Constipation cannot be excluded. 2. No evidence of nephrolithiasis. Multiple pelvic calcifications are again noted most likely phleboliths. 3. Aortoiliac  atherosclerotic vascular disease. Electronically Signed   By: Marcello Moores  Register   On: 04/11/2017 12:55       Today   Subjective:   Watt Climes  Pt feelig ok no complaints.  Objective:   Blood pressure (!) 164/83, pulse (!) 57, temperature 98.2 F (36.8 C), temperature source Oral, resp. rate 20, height 5\' 8"  (1.727 m), weight 145 lb 1 oz (65.8 kg), SpO2 100 %.  .  Intake/Output Summary (Last 24 hours) at 04/19/17 1306 Last data filed at 04/19/17 1239  Gross per 24 hour  Intake             1221 ml  Output                0 ml  Net             1221 ml    Exam VITAL SIGNS: Blood pressure (!) 164/83, pulse (!) 57, temperature 98.2 F (36.8 C), temperature source Oral, resp. rate 20, height 5\' 8"  (1.727 m), weight 145 lb 1 oz (65.8 kg), SpO2 100 %.  GENERAL:  71 y.o.-year-old patient lying in the bed with no acute distress.  EYES: Pupils equal, round, reactive to light and accommodation. No scleral icterus. Extraocular muscles intact.  HEENT: Head atraumatic, normocephalic. Oropharynx and nasopharynx clear.  NECK:  Supple, no jugular venous distention. No thyroid enlargement, no tenderness.  LUNGS: Normal breath sounds bilaterally, no wheezing, rales,rhonchi or crepitation. No use of accessory muscles of respiration.  CARDIOVASCULAR: S1, S2 normal. No murmurs, rubs, or gallops.  ABDOMEN: Soft, nontender, nondistended. Bowel sounds present. No organomegaly or mass.  EXTREMITIES: No pedal edema, cyanosis, or clubbing.  NEUROLOGIC: Cranial nerves II through XII are intact. Muscle strength 5/5  in all extremities. Sensation intact. Gait not checked.  PSYCHIATRIC: The patient is alert and oriented x 3.  SKIN: No obvious rash, lesion, or ulcer.   Data Review     CBC w Diff: Lab Results  Component Value Date   WBC 8.8 04/18/2017   HGB 13.2 04/18/2017   HCT 38.3 (L) 04/18/2017   HCT 42.6 03/08/2017   PLT 240 04/18/2017   PLT 255 03/08/2017   LYMPHOPCT 20 04/17/2017   MONOPCT  11 04/17/2017   EOSPCT 1 04/17/2017   BASOPCT 0 04/17/2017   CMP: Lab Results  Component Value Date   NA 138 04/19/2017   NA 135 03/08/2017   K 4.0 04/19/2017   CL 109 04/19/2017   CO2 23 04/19/2017   BUN 48 (H) 04/19/2017   BUN 45 (H) 03/08/2017   CREATININE 2.60 (H) 04/19/2017   PROT 8.3 (H) 04/17/2017   ALBUMIN 4.1 04/17/2017   ALBUMIN 4.1 03/08/2017   BILITOT 0.9 04/17/2017   ALKPHOS 94 04/17/2017   AST 20 04/17/2017   ALT 44 04/17/2017  .  Micro Results Recent Results (from the past 240 hour(s))  MRSA PCR Screening     Status: None   Collection Time: 04/17/17  7:31 PM  Result Value Ref Range Status   MRSA by PCR NEGATIVE NEGATIVE Final    Comment:        The GeneXpert MRSA Assay (FDA approved for NASAL specimens only), is one component of a comprehensive MRSA colonization surveillance program. It is not intended to diagnose MRSA infection nor to guide or monitor treatment for MRSA infections.         Code Status Orders        Start     Ordered   04/17/17 1928  Full code  Continuous     04/17/17 1928    Code Status History    Date Active Date Inactive Code Status Order ID Comments User Context   09/10/2013  2:20 PM 09/12/2013  4:09 PM Full Code [id number]  Hoy Morn, MD ED   03/09/2012 12:56 PM 03/10/2012  5:52 PM Full Code [id number]  Midge Aver, RN Inpatient   01/05/2012 12:54 AM 01/07/2012  9:17 PM Full Code [id number]  Tawanna Solo, RN Inpatient   11/09/2011 10:30 AM 11/14/2011  5:35 PM Full Code [id number]  Cheryln Manly, RN ED          Follow-up Information    [facility]. Schedule an appointment as soon as possible for a visit.   Specialty:  Nutrition & Diabetics Contact information: [place] [id number] [address] [place]       Juline Patch, MD. Go on 05/02/2017.   Specialty:  Family Medicine Why:  @9 :30a Contact information: [address] [place] [address] [phone]        Judi Cong, MD Follow up in 2 week(s).   Specialty:  Endocrinology Contact information: [place]  [address] [phone]           Discharge Medications   Allergies as of 04/19/2017   No Known Allergies     Medication List    TAKE these medications   amLODipine 10 MG tablet Commonly known as:  NORVASC Take 1 tablet (10 mg total) by mouth daily.   atorvastatin 20 MG tablet Commonly known as:  LIPITOR Take 1 tablet (20 mg total) by mouth daily.  docusate sodium 100 MG capsule Commonly known as:  COLACE Take 1 capsule (100 mg total) by mouth 2 (two) times daily.   ergocalciferol 50000 units capsule Commonly known as:  VITAMIN D2 Take 1 capsule (50,000 Units total) by mouth every 6 (six) weeks.   finasteride 5 MG tablet Commonly known as:  PROSCAR Take 1 tablet (5 mg total) by mouth daily.   fluticasone 50 MCG/ACT nasal spray Commonly known as:  FLONASE USE 2 SPRAYS IN EACH NOSTRIL DAILY AS NEEDED   folic acid 1 MG tablet Commonly known as:  FOLVITE Take 1 tablet (1 mg total) by mouth daily.   glimepiride 4 MG tablet Commonly known as:  AMARYL Take 1 tablet (4 mg total) by mouth 2 (two) times daily with a meal. What changed:  medication strength  how much to take  when to take this  Another medication with the same name was removed. Continue taking this medication, and follow the directions you see here.   glucose blood test strip 1 each by Other route 2 (two) times daily. Use as instructed   insulin glargine 100 UNIT/ML injection Commonly known as:  LANTUS Inject 0.1 mLs (10 Units total) into the skin daily.   labetalol 100 MG tablet Commonly known as:  NORMODYNE Take 1 tablet (100 mg total) by mouth 2 (two) times daily.   Melatonin 1 MG Tabs Take 1 tablet (1 mg total) by mouth daily.   metoprolol succinate 25 MG 24 hr tablet Commonly  known as:  TOPROL-XL Take 1 tablet (25 mg total) by mouth daily.   multivitamin with minerals tablet TAKE 1 TABLET BY MOUTH DAILY   OLANZapine 15 MG tablet Commonly known as:  ZYPREXA Take 2 tablets by mouth at bedtime.   polyethylene glycol powder powder Commonly known as:  GLYCOLAX/MIRALAX MIX 17 GRAMS (1 CAPFUL) IN 8 OUNCES WATER & DRINK DAILY   tamsulosin 0.4 MG Caps capsule Commonly known as:  FLOMAX TAKE 1 CAPSULE BY MOUTH DAILY IN THE MORNING   thiamine 100 MG tablet Commonly known as:  VITAMIN B-1 Take 1 tablet (100 mg total) by mouth daily.   TRUEPLUS LANCETS 26G Misc 1 each by Does not apply route 2 (two) times daily.          Total Time in preparing paper work, data evaluation and todays exam - 35 minutes  Dustin Flock M.D on 04/19/2017 at 1:06 PM  [facility]   Office  [phone]

## 2017-04-19 NOTE — NC FL2 (Signed)
[place] LEVEL OF CARE SCREENING TOOL     IDENTIFICATION  Patient Name: [name] Birthdate: 10-16-1946 Sex: male Admission Date (Current Location): 04/17/2017  [place] and [place] Number:  Engineering geologist and Address:  [facility], [address], [place], [place] [address]      Provider Number: [id number]  Attending Physician Name and Address:  Dustin Flock, MD  Relative Name and Phone Number:       Current Level of Care: Hospital Recommended Level of Care:  (group home) Prior Approval Number:    Date Approved/Denied:   PASRR Number:    Discharge Plan:  (group home)    Current Diagnoses: Patient Active Problem List   Diagnosis Date Noted  . Hyperglycemia 04/17/2017  . Folic acid deficiency 62/94/7654  . Chronic constipation 03/02/2017  . Thiamine deficiency 03/02/2017  . Chronic seasonal allergic rhinitis due to pollen 03/02/2017  . Type 2 diabetes mellitus without complication, without long-term current use of insulin ([place]) 03/02/2017  . Lives in long-term care facility 03/02/2017  . Hyperlipidemia 03/02/2017  . Vitamin D deficiency 03/02/2017  . Anemia 03/02/2017  . Encephalopathy, metabolic 65/01/5464  . CKD (chronic kidney disease) stage 4, GFR 15-29 ml/min (HCC) 01/05/2012  . Tobacco abuse 01/05/2012  . Metabolic acidosis 68/10/7516  . Pancreatitis, hx of  11/09/2011  . Schizophrenia ([place]) 11/09/2011  . History of nephrolithiasis 11/09/2011  . 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 Hypertension 11/09/2011  . Alcohol abuse 11/09/2011  . Altered mental status 11/09/2011  . UTI (lower urinary tract infection) 11/09/2011  . Acute on chronic renal insufficiency 11/09/2011    Orientation RESPIRATION BLADDER Height & Weight        Normal Continent Weight: 145 lb 1 oz (65.8 kg) Height:  5\' 8"  (172.7 cm)  BEHAVIORAL SYMPTOMS/MOOD NEUROLOGICAL BOWEL NUTRITION STATUS     (none) Continent   (cardiac)  AMBULATORY STATUS COMMUNICATION OF NEEDS Skin   Limited Assist Verbally Normal                       Personal Care Assistance Level of Assistance  Bathing, Dressing Bathing Assistance: Limited assistance   Dressing Assistance: Limited assistance     Functional Limitations Info   (none)          SPECIAL CARE FACTORS FREQUENCY                       Contractures Contractures Info: Not present    Additional Factors Info  Code Status, Insulin Sliding Scale Code Status Info: full             Medication List    TAKE these medications   amLODipine 10 MG tablet Commonly known as:  NORVASC Take 1 tablet (10 mg total) by mouth daily.   atorvastatin 20 MG tablet Commonly known as:  LIPITOR Take 1 tablet (20 mg total) by mouth daily.   docusate sodium 100 MG capsule Commonly known as:  COLACE Take 1 capsule (100 mg total) by mouth 2 (two) times daily.   ergocalciferol 50000 units capsule Commonly known as:  VITAMIN D2 Take 1 capsule (50,000 Units total) by mouth every 6 (six) weeks.   finasteride 5 MG tablet Commonly known as:  PROSCAR Take 1 tablet (5 mg total) by mouth daily.   fluticasone 50 MCG/ACT nasal spray Commonly known as:  FLONASE USE 2 SPRAYS IN EACH NOSTRIL DAILY AS NEEDED   folic  acid 1 MG tablet Commonly known as:  FOLVITE Take 1 tablet (1 mg total) by mouth daily.   glimepiride 4 MG tablet Commonly known as:  AMARYL Take 1 tablet (4 mg total) by mouth 2 (two) times daily with a meal. What changed:  medication strength  how much to take  when to take this  Another medication with the same name was removed. Continue taking this medication, and follow the directions you see here.   glucose blood test strip 1 each by Other route 2 (two) times daily. Use as instructed   insulin glargine 100 UNIT/ML injection Commonly known as:  LANTUS Inject 0.1 mLs (10 Units total) into the skin daily.   labetalol 100  MG tablet Commonly known as:  NORMODYNE Take 1 tablet (100 mg total) by mouth 2 (two) times daily.   Melatonin 1 MG Tabs Take 1 tablet (1 mg total) by mouth daily.   metoprolol succinate 25 MG 24 hr tablet Commonly known as:  TOPROL-XL Take 1 tablet (25 mg total) by mouth daily.   multivitamin with minerals tablet TAKE 1 TABLET BY MOUTH DAILY   OLANZapine 15 MG tablet Commonly known as:  ZYPREXA Take 2 tablets by mouth at bedtime.   polyethylene glycol powder powder Commonly known as:  GLYCOLAX/MIRALAX MIX 17 GRAMS (1 CAPFUL) IN 8 OUNCES WATER & DRINK DAILY   tamsulosin 0.4 MG Caps capsule Commonly known as:  FLOMAX TAKE 1 CAPSULE BY MOUTH DAILY IN THE MORNING   thiamine 100 MG tablet Commonly known as:  VITAMIN B-1 Take 1 tablet (100 mg total) by mouth daily.   TRUEPLUS LANCETS 26G Misc 1 each by Does not apply route 2 (two) times daily.          Additional Information    Shela Leff, LCSW

## 2017-04-19 NOTE — Progress Notes (Signed)
Pt alert to self. VSS. Pt tolerating diet well. No complaints of pain or nausea. IV removed intact, prescriptions given. Group home member voiced understanding of discharge instructions with no further questions. Pt discharged via wheelchair with nurse tech.

## 2017-04-19 NOTE — Progress Notes (Signed)
Per phamacist okay to give lantus and sliding scale coverage at the same time.

## 2017-04-19 NOTE — Progress Notes (Addendum)
Inpatient Diabetes Program Recommendations  AACE/ADA: New Consensus Statement on Inpatient Glycemic Control (2015)  Target Ranges:  Prepandial:   less than 140 mg/dL      Peak postprandial:   less than 180 mg/dL (1-2 hours)      Critically ill patients:  140 - 180 mg/dL   Lab Results  Component Value Date   GLUCAP 319 (H) 04/19/2017   HGBA1C 9.6 (H) 03/08/2017    Review of Glycemic ControlResults for [name], [name] (MRN [id number]) as of 04/19/2017 08:44  Ref. Range 04/18/2017 07:16 04/18/2017 12:49 04/18/2017 16:43 04/18/2017 17:45 04/18/2017 21:30 04/19/2017 05:59 04/19/2017 07:34  Glucose-Capillary Latest Ref Range: 65 - 99 mg/dL 173 (H) 235 (H) 437 (H) 414 (H) 170 (H)  319 (H)    Diabetes history: Type 2 diabetes Outpatient Diabetes medications: Amaryl 2 mg bid Current orders for Inpatient glycemic control:  Novolog sensitive tid with meals, Amaryl 4 mg bid with meals, Actos 30 mg daily  Inpatient Diabetes Program Recommendations:    Note that blood sugars increased > 400 mg/dL on 04/18/17.   I spoke with patient on 04/18/17 however he did not seem to understand what diabetes was or what medications he was taking before.  My understanding from nursing yesterday was that patient cannot be on insulin at the group home. Will explore with SW today.   Thanks, Adah Perl, RN, BC-ADM Inpatient Diabetes Coordinator Pager [phone] (8a-5p)  1020: spoke with group home owner, [name].  He states that if necessary, staff at group home can administer insulin to patient.  He states that he has spoken with staff and that they are familiar with insulin and have given to patients in the past.  It would still be helpful to have home health to make sure that appropriate staff is comfortable with [name]'s care.  Text paged MD to let him know that insulin is an option if needed.

## 2017-04-19 NOTE — Care Management (Signed)
Patient to return to group home today.  CSW facilitating.  Patient to discharge with home health RN for Medication Management for insulin.  Corene Cornea with [facility] notified of discharge.  RNCM signing off

## 2017-04-21 ENCOUNTER — Telehealth: Payer: Self-pay | Admitting: Family Medicine

## 2017-04-21 NOTE — Telephone Encounter (Signed)
Facility member called about patient needing a refill on lancet syringes-Tried to get him in an earlier appt, but did not find one. He said he will try to call back next Tuesday.

## 2017-04-25 ENCOUNTER — Other Ambulatory Visit: Payer: Self-pay

## 2017-04-25 ENCOUNTER — Telehealth: Payer: Self-pay

## 2017-04-25 DIAGNOSIS — E109 Type 1 diabetes mellitus without complications: Secondary | ICD-10-CM

## 2017-04-25 NOTE — Telephone Encounter (Signed)
Eddie Dibbles called to get insulin syringes sent in to [facility] in [place]- spoke to [place] and called in

## 2017-04-26 NOTE — Progress Notes (Signed)
   04/18/17 1630  PT Time Calculation  PT Start Time (ACUTE ONLY) 1600  PT Stop Time (ACUTE ONLY) 1618  PT Time Calculation (min) (ACUTE ONLY) 18 min  PT G-Codes **NOT FOR INPATIENT CLASS**  Functional Assessment Tool Used AM-PAC 6 Clicks Basic Mobility  Functional Limitation Mobility: Walking and moving around  Mobility: Walking and Moving Around Current Status (K0677) CJ  Mobility: Walking and Moving Around Goal Status (C3403) CI  Mobility: Walking and Moving Around Discharge Status (T2481) CJ  PT General Charges  $$ ACUTE PT VISIT 1 Procedure  PT Evaluation  $PT Eval Low Complexity 1 Procedure   Late entry G-codes entered after review of initial documentation.  Leitha Bleak, PT 04/26/17, 1:33 PM [phone]

## 2017-05-02 ENCOUNTER — Inpatient Hospital Stay: Payer: Medicare Other | Admitting: Family Medicine

## 2017-05-16 ENCOUNTER — Ambulatory Visit: Payer: Medicare Other | Admitting: Primary Care

## 2017-05-16 ENCOUNTER — Telehealth: Payer: Self-pay | Admitting: Family Medicine

## 2017-05-16 NOTE — Telephone Encounter (Signed)
Patient did not come in for their appointment today for new pt appt  Please let me know if patient needs to be contacted immediately for follow up or no follow up needed. Do you want to charge the NSF?

## 2017-05-16 NOTE — Telephone Encounter (Signed)
No follow up needed, no fee.

## 2017-05-17 ENCOUNTER — Other Ambulatory Visit: Payer: Self-pay | Admitting: Family Medicine

## 2017-05-17 DIAGNOSIS — E1165 Type 2 diabetes mellitus with hyperglycemia: Secondary | ICD-10-CM | POA: Insufficient documentation

## 2017-05-17 DIAGNOSIS — I10 Essential (primary) hypertension: Secondary | ICD-10-CM

## 2017-05-17 DIAGNOSIS — F5101 Primary insomnia: Secondary | ICD-10-CM

## 2017-05-18 ENCOUNTER — Other Ambulatory Visit: Payer: Self-pay

## 2017-07-11 NOTE — Progress Notes (Signed)
07/12/2017 10:31 AM   [name] [birth year] [id number]  Referring provider: Juline Patch, MD [address] [place], [place] [address]  Chief Complaint  Patient presents with  . Benign Prostatic Hypertrophy    3 month follow up   . Nephrolithiasis    history of    HPI: Patient is a 71 year old African-American male who presents for a three month follow up after starting finasteride.    Patient was referred by Dr. Murlean Iba for urinary incontinence with his caregiver.  Patient is a poor historian.    He had been seen by urology in 2008 for an obstructing left proximal ureteral calculus and bladder calculi. He underwent PCNL and cystolithopaxy in 2008.   Noncontrast CT performed 2011 noted bilateral nephrolithiasis.  He has not had any symptoms of renal colic or gross hematuria.  KUB taken in May 2018 noted no nephrolithiasis.  BPH WITH LUTS His IPSS score today is 6, which is mild lower urinary tract symptomatology. He is mostly satisfied with his quality life due to his urinary symptoms.  His PVR is 86 mL.  His previous I PSS score was 31/4.  His previous PVR was 294 mL.  His major complaints today are weak stream and nocturia.  He has had these symptoms for a few months.  He denies any dysuria, hematuria or suprapubic pain.   He currently taking tamsulosin 0.4 mg daily.  He also denies any recent fevers, chills, nausea or vomiting.  He does not have a family history of PCa.      IPSS    Row Name 07/12/17 1000         International Prostate Symptom Score   How often have you had the sensation of not emptying your bladder? Not at All     How often have you had to urinate less than every two hours? Not at All     How often have you found you stopped and started again several times when you urinated? Not at All     How often have you found it difficult to postpone urination? Not at All     How often have you had a weak urinary stream? About half the time      How often have you had to strain to start urination? Not at All     How many times did you typically get up at night to urinate? 3 Times     Total IPSS Score 6       Quality of Life due to urinary symptoms   If you were to spend the rest of your life with your urinary condition just the way it is now how would you feel about that? Mostly Satisfied        Score:  1-7 Mild 8-19 Moderate 20-35 Severe    PMH: Past Medical History:  Diagnosis Date  . Alcoholism ([place])   . Allergy   . Altered mental status 2011   hx of 2/2 hypercalcemia, EtOH w/d and medication overuse  . Angina    mild pain on arm, left chest, moves down left chest  . Chronic renal insufficiency    with history of acute on chronic secondary to dehydration and hypercalcemia  . Dementia   . DM (diabetes mellitus) ([place])   . Encephalopathy   . Fx humeral neck 2008   right side, no history of surgical repair  . History of nephrolithiasis 2008   s/p ureterolithotomy   .  Hypercalcemia    2/2 primary hyperparathyroidism  . Hypertension   . Pancreatitis 2011   history of several admissions for pancreatitis   . Parathyroid adenoma   . Primary hyperparathyroidism ([place])    s/p parathyroid adenoma resection Oct. 8, 2011, repeat scan showed residual right adenoma, however pt did not f/u with surgery as outpt  . Schizophrenia ([place])    previously on zyprexa  . Tobacco abuse     Surgical History: Past Surgical History:  Procedure Laterality Date  . Ankle Pinning    . cysto, removal of bladder stone, attempted stent placement  08/15/2007  . cystoscopy, laser cystolithoplaxy  08/16/2007  . KIDNEY STONE SURGERY  07/2007 X2   /E-chart  . open left proximal ureterolithotomy  09/18/2007  . parathyroid adenoma resection  Oct. 8, 2011   residual adenoma Oct. 12, 2011  . PARATHYROIDECTOMY  03/09/2012   Procedure: PARATHYROIDECTOMY;  Surgeon: Pedro Earls, MD;  Location: [facility];  Service: General;  Laterality: N/A;   mediastinal paraqthyroidectomy   . THYROIDECTOMY  03/09/12    Home Medications:  Allergies as of 07/12/2017   No Known Allergies     Medication List       Accurate as of 07/12/17 10:31 AM. Always use your most recent med list.          amLODipine 10 MG tablet Commonly known as:  NORVASC Take 1 tablet (10 mg total) by mouth daily.   amLODipine 10 MG tablet Commonly known as:  NORVASC TAKE 1 TABLET BY MOUTH DAILY   atorvastatin 20 MG tablet Commonly known as:  LIPITOR Take 1 tablet (20 mg total) by mouth daily.   atorvastatin 20 MG tablet Commonly known as:  LIPITOR TAKE 1 TABLET (20 MG TOTAL) BY MOUTH DAILY.   docusate sodium 100 MG capsule Commonly known as:  COLACE Take 1 capsule (100 mg total) by mouth 2 (two) times daily.   ergocalciferol 50000 units capsule Commonly known as:  VITAMIN D2 Take 1 capsule (50,000 Units total) by mouth every 6 (six) weeks.   finasteride 5 MG tablet Commonly known as:  PROSCAR Take 1 tablet (5 mg total) by mouth daily.   fluticasone 50 MCG/ACT nasal spray Commonly known as:  FLONASE USE 2 SPRAYS IN EACH NOSTRIL DAILY AS NEEDED   folic acid 1 MG tablet Commonly known as:  FOLVITE Take 1 tablet (1 mg total) by mouth daily.   glimepiride 4 MG tablet Commonly known as:  AMARYL Take 1 tablet (4 mg total) by mouth 2 (two) times daily with a meal.   glucose blood test strip 1 each by Other route 2 (two) times daily. Use as instructed   insulin glargine 100 UNIT/ML injection Commonly known as:  LANTUS Inject 0.1 mLs (10 Units total) into the skin daily.   labetalol 100 MG tablet Commonly known as:  NORMODYNE Take 1 tablet (100 mg total) by mouth 2 (two) times daily.   lisinopril 5 MG tablet Commonly known as:  PRINIVIL,ZESTRIL Take 5 mg by mouth daily.   MELATONIN TR 1 MG Tbcr Generic drug:  Melatonin ER TAKE 1 TABLET BY MOUTH EVERY NIGHT AT BEDTIME   metoprolol succinate 25 MG 24 hr tablet Commonly known as:   TOPROL-XL TAKE 1 TABLET BY MOUTH DAILY   MULTI-VITAMINS Tabs Take by mouth.   multivitamin with minerals tablet TAKE 1 TABLET BY MOUTH DAILY   OLANZapine 15 MG tablet Commonly known as:  ZYPREXA Take 2 tablets by mouth at bedtime.  pioglitazone 30 MG tablet Commonly known as:  ACTOS Take by mouth.   polyethylene glycol powder powder Commonly known as:  GLYCOLAX/MIRALAX MIX 17 GRAMS (1 CAPFUL) IN 8 OUNCES OF WATER & DRINK DAILY   tamsulosin 0.4 MG Caps capsule Commonly known as:  FLOMAX TAKE 1 CAPSULE BY MOUTH DAILY IN THE MORNING   thiamine 100 MG tablet Commonly known as:  VITAMIN B-1 Take 1 tablet (100 mg total) by mouth daily.   TRUEPLUS LANCETS 26G Misc 1 each by Does not apply route 2 (two) times daily.   VITAMIN D-1000 MAX ST 1000 units tablet Generic drug:  Cholecalciferol Take by mouth.       Allergies: No Known Allergies  Family History: Family History  Problem Relation Age of Onset  . Kidney cancer Neg Hx   . Bladder Cancer Neg Hx   . Prostate cancer Neg Hx     Social History:  reports that he has been smoking Cigarettes.  He has a 50.00 pack-year smoking history. He has never used smokeless tobacco. He reports that he does not drink alcohol or use drugs.  ROS: UROLOGY Frequent Urination?: No Hard to postpone urination?: No Burning/pain with urination?: No Get up at night to urinate?: No Leakage of urine?: No Urine stream starts and stops?: No Trouble starting stream?: No Do you have to strain to urinate?: No Blood in urine?: No Urinary tract infection?: No Sexually transmitted disease?: No Injury to kidneys or bladder?: No Painful intercourse?: No Weak stream?: No Erection problems?: No Penile pain?: No  Gastrointestinal Nausea?: No Vomiting?: No Indigestion/heartburn?: No Diarrhea?: No Constipation?: No  Constitutional Fever: No Night sweats?: No Weight loss?: No Fatigue?: No  Skin Skin rash/lesions?: No Itching?:  No  Eyes Blurred vision?: No Double vision?: No  Ears/Nose/Throat Sore throat?: No Sinus problems?: No  Hematologic/Lymphatic Swollen glands?: No Easy bruising?: No  Cardiovascular Leg swelling?: No Chest pain?: No  Respiratory Cough?: No Shortness of breath?: No  Endocrine Excessive thirst?: No  Musculoskeletal Back pain?: No Joint pain?: No  Neurological Headaches?: No Dizziness?: No  Psychologic Depression?: No Anxiety?: No  Physical Exam: BP 121/79   Pulse (!) 55   Ht 5\' 9"  (1.753 m)   Wt 162 lb 12.8 oz (73.8 kg)   BMI 24.04 kg/m   Constitutional: Well nourished. Alert and oriented, No acute distress. HEENT: [place] AT, moist mucus membranes. Trachea midline, no masses. Cardiovascular: No clubbing, cyanosis, or edema. Respiratory: Normal respiratory effort, no increased work of breathing. Skin: No rashes, bruises or suspicious lesions. Lymph: No cervical or inguinal adenopathy. Neurologic: Grossly intact, no focal deficits, moving all 4 extremities. Psychiatric: Normal mood and affect.  Laboratory Data: Lab Results  Component Value Date   WBC 8.8 04/18/2017   HGB 13.2 04/18/2017   HCT 38.3 (L) 04/18/2017   MCV 88.3 04/18/2017   PLT 240 04/18/2017    Lab Results  Component Value Date   CREATININE 2.60 (H) 04/19/2017    Lab Results  Component Value Date   PSA 1.79 Test Methodology: [employer] PSA 08/13/2007    Lab Results  Component Value Date   HGBA1C >15.5 (H) 04/17/2017        Component Value Date/Time   CHOL 133 03/08/2017 1102   HDL 41 03/08/2017 1102   CHOLHDL 3.2 03/08/2017 1102   CHOLHDL 4.8 08/15/2010 1634   VLDL 28 08/15/2010 1634   LDLCALC 62 03/08/2017 1102    Lab Results  Component Value Date   AST 20 04/17/2017  Lab Results  Component Value Date   ALT 44 04/17/2017   I have reviewed the labs  Pertinent Imaging: CLINICAL DATA:  Right flank pain.  EXAM: ABDOMEN - 1 VIEW  COMPARISON:  CT  08/15/2010.  FINDINGS: Large amount stool noted throughout the colon rectum. Constipation cannot be excluded. No bowel distention . Pelvic calcifications noted consistent phleboliths. Similar findings noted on prior exam. Aortoiliac atherosclerotic vascular calcification. Degenerative changes lumbar spine and both hips .  IMPRESSION: 1. Large amount stool noted throughout the colon rectum. Constipation cannot be excluded.  2. No evidence of nephrolithiasis. Multiple pelvic calcifications are again noted most likely phleboliths.  3. Aortoiliac atherosclerotic vascular disease.   Electronically Signed   By: Marcello Moores  Register   On: 04/11/2017 12:55  I have independently reviewed the films   Assessment & Plan:    1. BPH with LUTS  - IPSS score is 6/2, it is improved  - Continue conservative management, avoiding bladder irritants and timed voiding's  - most bothersome symptoms is/are weak stream and nocturia  - Continue tamsulosin 0.4 mg daily and finasteride 5 mg daily  - RTC in 12 months for I PSS, exam and PVR  2. History of nephrolithiasis  - Bilateral nephrolithiasis seen on CT in 2011  - No stones seen on recent KUB  3. History of bladder calculus  - see above  Return in about 1 year (around 07/12/2018) for IPSS, PVR and exam.  These notes generated with voice recognition software. I apologize for typographical errors.  Zara Council, [facility] [address], [place], [place] [address] [phone]

## 2017-07-12 ENCOUNTER — Ambulatory Visit (INDEPENDENT_AMBULATORY_CARE_PROVIDER_SITE_OTHER): Payer: Medicare Other | Admitting: Urology

## 2017-07-12 ENCOUNTER — Encounter: Payer: Self-pay | Admitting: Urology

## 2017-07-12 VITALS — BP 121/79 | HR 55 | Ht 69.0 in | Wt 162.8 lb

## 2017-07-12 DIAGNOSIS — Z87448 Personal history of other diseases of urinary system: Secondary | ICD-10-CM

## 2017-07-12 DIAGNOSIS — N138 Other obstructive and reflux uropathy: Secondary | ICD-10-CM

## 2017-07-12 DIAGNOSIS — N401 Enlarged prostate with lower urinary tract symptoms: Secondary | ICD-10-CM | POA: Diagnosis not present

## 2017-07-12 DIAGNOSIS — Z87442 Personal history of urinary calculi: Secondary | ICD-10-CM

## 2017-07-12 LAB — BLADDER SCAN AMB NON-IMAGING: Scan Result: 86

## 2017-10-09 ENCOUNTER — Other Ambulatory Visit: Payer: Self-pay | Admitting: Family Medicine

## 2017-10-09 DIAGNOSIS — J301 Allergic rhinitis due to pollen: Secondary | ICD-10-CM

## 2018-01-08 ENCOUNTER — Other Ambulatory Visit: Payer: Self-pay | Admitting: Family Medicine

## 2018-02-06 ENCOUNTER — Other Ambulatory Visit: Payer: Self-pay | Admitting: Family Medicine

## 2018-02-06 DIAGNOSIS — I10 Essential (primary) hypertension: Secondary | ICD-10-CM

## 2018-04-04 ENCOUNTER — Other Ambulatory Visit: Payer: Self-pay

## 2018-04-04 ENCOUNTER — Encounter: Payer: Self-pay | Admitting: Emergency Medicine

## 2018-04-04 ENCOUNTER — Emergency Department: Payer: Medicare Other

## 2018-04-04 ENCOUNTER — Emergency Department
Admission: EM | Admit: 2018-04-04 | Discharge: 2018-04-04 | Disposition: A | Discharge status: Home / Self Care | Payer: Medicare Other | Attending: Emergency Medicine | Admitting: Emergency Medicine

## 2018-04-04 DIAGNOSIS — F17210 Nicotine dependence, cigarettes, uncomplicated: Secondary | ICD-10-CM | POA: Diagnosis not present

## 2018-04-04 DIAGNOSIS — S4992XA Unspecified injury of left shoulder and upper arm, initial encounter: Secondary | ICD-10-CM | POA: Diagnosis present

## 2018-04-04 DIAGNOSIS — Y999 Unspecified external cause status: Secondary | ICD-10-CM | POA: Diagnosis not present

## 2018-04-04 DIAGNOSIS — E1122 Type 2 diabetes mellitus with diabetic chronic kidney disease: Secondary | ICD-10-CM | POA: Insufficient documentation

## 2018-04-04 DIAGNOSIS — Z79899 Other long term (current) drug therapy: Secondary | ICD-10-CM | POA: Insufficient documentation

## 2018-04-04 DIAGNOSIS — N184 Chronic kidney disease, stage 4 (severe): Secondary | ICD-10-CM | POA: Diagnosis not present

## 2018-04-04 DIAGNOSIS — F0390 Unspecified dementia without behavioral disturbance: Secondary | ICD-10-CM | POA: Diagnosis not present

## 2018-04-04 DIAGNOSIS — Z794 Long term (current) use of insulin: Secondary | ICD-10-CM | POA: Insufficient documentation

## 2018-04-04 DIAGNOSIS — W228XXA Striking against or struck by other objects, initial encounter: Secondary | ICD-10-CM | POA: Insufficient documentation

## 2018-04-04 DIAGNOSIS — S42202A Unspecified fracture of upper end of left humerus, initial encounter for closed fracture: Secondary | ICD-10-CM

## 2018-04-04 DIAGNOSIS — Y929 Unspecified place or not applicable: Secondary | ICD-10-CM | POA: Diagnosis not present

## 2018-04-04 DIAGNOSIS — I129 Hypertensive chronic kidney disease with stage 1 through stage 4 chronic kidney disease, or unspecified chronic kidney disease: Secondary | ICD-10-CM | POA: Diagnosis not present

## 2018-04-04 DIAGNOSIS — Y939 Activity, unspecified: Secondary | ICD-10-CM | POA: Diagnosis not present

## 2018-04-04 MED ORDER — OXYCODONE-ACETAMINOPHEN 5-325 MG PO TABS
1.0000 | ORAL_TABLET | Freq: Once | ORAL | Status: AC
  Administered 2018-04-04: 1 via ORAL
  Filled 2018-04-04: qty 1

## 2018-04-04 MED ORDER — OXYCODONE-ACETAMINOPHEN 5-325 MG PO TABS: 1.0000 | ORAL_TABLET | ORAL | 0 refills | Status: DC | PRN
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
## 2018-04-04 NOTE — ED Notes (Signed)
Group home manager called, states he will come pick up patient. Awaiting ride at this time.

## 2018-04-04 NOTE — ED Notes (Addendum)
Attempted to call both numbers listed for legal guardian, [name]. No answer.left message.

## 2018-04-04 NOTE — ED Notes (Signed)
First Nurse Note:  [place] Manager phone [phone], call when patient is discharged.  Group Home Number: [phone].
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
## 2018-04-04 NOTE — ED Notes (Signed)
Pt alert and oriented X4, active, cooperative, pt in NAD. RR even and unlabored, color WNL.  Pt informed to return if any life threatening symptoms occur.  Discharge and followup instructions reviewed.  
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
## 2018-07-09 DIAGNOSIS — F17200 Nicotine dependence, unspecified, uncomplicated: Secondary | ICD-10-CM | POA: Insufficient documentation

## 2018-07-09 DIAGNOSIS — N2581 Secondary hyperparathyroidism of renal origin: Secondary | ICD-10-CM | POA: Insufficient documentation

## 2018-07-10 NOTE — Progress Notes (Signed)
07/11/2018 2:25 PM   [name] [birth year] [id number]  Referring provider: [facility], [facility] [name],  [id number]  No chief complaint on file.   HPI: Patient is a 72 year old African-American male with a history of nephrolithiasis and BPH with LU TS who presents today for a follow up.     History of nephrolithiasis He had been seen by urology in 2008 for an obstructing left proximal ureteral calculus and bladder calculi. He underwent PCNL and cystolithopaxy in 2008.   Noncontrast CT performed 2011 noted bilateral nephrolithiasis.  He has not had any symptoms of renal colic or gross hematuria.  KUB taken in May 2018 noted no nephrolithiasis.  BPH WITH LUTS His PVR today is 11 mL.  He currently taking finasteride 5 mg daily.  He also denies any recent fevers, chills, nausea or vomiting.  He does not have a family history of PCa.   PMH: Past Medical History:  Diagnosis Date  . Alcoholism ([place])   . Allergy   . Altered mental status 2011   hx of 2/2 hypercalcemia, EtOH w/d and medication overuse  . Angina    mild pain on arm, left chest, moves down left chest  . Chronic renal insufficiency    with history of acute on chronic secondary to dehydration and hypercalcemia  . Dementia   . DM (diabetes mellitus) ([place])   . Encephalopathy   . Fx humeral neck 2008   right side, no history of surgical repair  . History of nephrolithiasis 2008   s/p ureterolithotomy   . Hypercalcemia    2/2 primary hyperparathyroidism  . Hypertension   . Pancreatitis 2011   history of several admissions for pancreatitis   . Parathyroid adenoma   . Primary hyperparathyroidism ([place])    s/p parathyroid adenoma resection Oct. 8, 2011, repeat scan showed residual right adenoma, however pt did not f/u with surgery as outpt  . Schizophrenia ([place])    previously on zyprexa  . Tobacco abuse     Surgical History: Past Surgical History:  Procedure Laterality  Date  . Ankle Pinning    . cysto, removal of bladder stone, attempted stent placement  08/15/2007  . cystoscopy, laser cystolithoplaxy  08/16/2007  . KIDNEY STONE SURGERY  07/2007 X2   /E-chart  . open left proximal ureterolithotomy  09/18/2007  . parathyroid adenoma resection  Oct. 8, 2011   residual adenoma Oct. 12, 2011  . PARATHYROIDECTOMY  03/09/2012   Procedure: PARATHYROIDECTOMY;  Surgeon: Seth Earls, MD;  Location: [place];  Service: General;  Laterality: N/A;  mediastinal paraqthyroidectomy   . THYROIDECTOMY  03/09/12    Home Medications:  Allergies as of 07/11/2018   No Known Allergies     Medication List        Accurate as of 07/11/18  2:25 PM. Always use your most recent med list.          amLODipine 10 MG tablet Commonly known as:  NORVASC Take 1 tablet (10 mg total) by mouth daily.   amLODipine 10 MG tablet Commonly known as:  NORVASC TAKE 1 TABLET BY MOUTH DAILY   atorvastatin 20 MG tablet Commonly known as:  LIPITOR Take 1 tablet (20 mg total) by mouth daily.   atorvastatin 20 MG tablet Commonly known as:  LIPITOR TAKE 1 TABLET (20 MG TOTAL) BY MOUTH DAILY.   docusate sodium 100 MG capsule Commonly known as:  COLACE Take 1 capsule (100 mg total) by mouth  2 (two) times daily.   ergocalciferol 50000 units capsule Commonly known as:  VITAMIN D2 Take 1 capsule (50,000 Units total) by mouth every 6 (six) weeks.   finasteride 5 MG tablet Commonly known as:  PROSCAR Take 1 tablet (5 mg total) by mouth daily.   fluticasone 50 MCG/ACT nasal spray Commonly known as:  FLONASE USE 2 SPRAYS IN EACH NOSTRIL DAILY AS NEEDED   folic acid 1 MG tablet Commonly known as:  FOLVITE Take 1 tablet (1 mg total) by mouth daily.   glimepiride 4 MG tablet Commonly known as:  AMARYL Take 1 tablet (4 mg total) by mouth 2 (two) times daily with a meal.   glucose blood test strip 1 each by Other route 2 (two) times daily. Use as instructed   insulin glargine 100  UNIT/ML injection Commonly known as:  LANTUS Inject 0.1 mLs (10 Units total) into the skin daily.   labetalol 100 MG tablet Commonly known as:  NORMODYNE Take 1 tablet (100 mg total) by mouth 2 (two) times daily.   lisinopril 5 MG tablet Commonly known as:  PRINIVIL,ZESTRIL Take 5 mg by mouth daily.   MELATONIN TR 1 MG Tbcr Generic drug:  Melatonin ER TAKE 1 TABLET BY MOUTH EVERY NIGHT AT BEDTIME   metoprolol succinate 25 MG 24 hr tablet Commonly known as:  TOPROL-XL TAKE 1 TABLET BY MOUTH DAILY   MULTI-VITAMINS Tabs Take by mouth.   multivitamin with minerals tablet TAKE 1 TABLET BY MOUTH DAILY   OLANZapine 15 MG tablet Commonly known as:  ZYPREXA Take 2 tablets by mouth at bedtime.   oxyCODONE-acetaminophen 5-325 MG tablet Commonly known as:  PERCOCET/ROXICET Take 1 tablet by mouth every 4 (four) hours as needed for severe pain.   polyethylene glycol powder powder Commonly known as:  GLYCOLAX/MIRALAX MIX 17 GRAMS (1 CAPFUL) IN 8 OUNCES OF WATER & DRINK DAILY   thiamine 100 MG tablet Commonly known as:  VITAMIN B-1 Take 1 tablet (100 mg total) by mouth daily.   TRUEPLUS LANCETS 26G Misc 1 each by Does not apply route 2 (two) times daily.   VITAMIN D-1000 MAX ST 1000 units tablet Generic drug:  Cholecalciferol Take by mouth.       Allergies: No Known Allergies  Family History: Family History  Problem Relation Age of Onset  . Kidney cancer Neg Hx   . Bladder Cancer Neg Hx   . Prostate cancer Neg Hx     Social History:  reports that he has been smoking cigarettes. He has a 50.00 pack-year smoking history. He has never used smokeless tobacco. He reports that he does not drink alcohol or use drugs.  ROS: UROLOGY Frequent Urination?: No Hard to postpone urination?: No Burning/pain with urination?: No Get up at night to urinate?: No Leakage of urine?: No Urine stream starts and stops?: No Trouble starting stream?: No Do you have to strain to  urinate?: No Blood in urine?: No Urinary tract infection?: No Sexually transmitted disease?: No Injury to kidneys or bladder?: No Painful intercourse?: No Weak stream?: No Erection problems?: No Penile pain?: No  Gastrointestinal Nausea?: No Vomiting?: No Indigestion/heartburn?: No Diarrhea?: No Constipation?: No  Constitutional Fever: No Night sweats?: No Weight loss?: No Fatigue?: No  Skin Skin rash/lesions?: No Itching?: No  Eyes Blurred vision?: No Double vision?: No  Ears/Nose/Throat Sore throat?: No Sinus problems?: No  Hematologic/Lymphatic Swollen glands?: No Easy bruising?: No  Cardiovascular Leg swelling?: No Chest pain?: No  Respiratory Cough?: No Shortness  of breath?: No  Endocrine Excessive thirst?: No  Musculoskeletal Back pain?: No Joint pain?: No  Neurological Headaches?: No Dizziness?: No  Psychologic Depression?: No Anxiety?: No  Physical Exam: BP 107/69 (BP Location: Left Arm, Patient Position: Sitting, Cuff Size: Normal)   Pulse 65   Ht 5\' 9"  (1.753 m)   Wt 168 lb (76.2 kg)   BMI 24.81 kg/m   Constitutional: Well nourished. Alert and oriented, No acute distress. HEENT: [place] AT, moist mucus membranes. Trachea midline, no masses. Cardiovascular: No clubbing, cyanosis, or edema. Respiratory: Normal respiratory effort, no increased work of breathing. GI: Abdomen is soft, non tender, non distended, no abdominal masses. Liver and spleen not palpable.  No hernias appreciated.  Stool sample for occult testing is not indicated.   GU: No CVA tenderness.  No bladder fullness or masses.  Patient with circumcised phallus.  Urethral meatus is patent.  No penile discharge. No penile lesions or rashes. Scrotum without lesions, cysts, rashes and/or edema.  Testicles are located scrotally bilaterally. No masses are appreciated in the testicles. Left and right epididymis are normal. Rectal: Patient with  normal sphincter tone. Anus and  perineum without scarring or rashes. No rectal masses are appreciated. Prostate is approximately 60 grams, could only palpate the apex and midportion of the glands, no nodules are appreciated. Seminal vesicles are normal. Skin: No rashes, bruises or suspicious lesions. Lymph: No cervical or inguinal adenopathy. Neurologic: Grossly intact, no focal deficits, moving all 4 extremities. Psychiatric: Normal mood and affect.  Laboratory Data: Lab Results  Component Value Date   WBC 8.8 04/18/2017   HGB 13.2 04/18/2017   HCT 38.3 (L) 04/18/2017   MCV 88.3 04/18/2017   PLT 240 04/18/2017    Lab Results  Component Value Date   CREATININE 2.60 (H) 04/19/2017    Lab Results  Component Value Date   PSA 1.79 Test Methodology: [employer] PSA 08/13/2007    Lab Results  Component Value Date   HGBA1C >15.5 (H) 04/17/2017        Component Value Date/Time   CHOL 133 03/08/2017 1102   HDL 41 03/08/2017 1102   CHOLHDL 3.2 03/08/2017 1102   CHOLHDL 4.8 08/15/2010 1634   VLDL 28 08/15/2010 1634   LDLCALC 62 03/08/2017 1102    Lab Results  Component Value Date   AST 20 04/17/2017   Lab Results  Component Value Date   ALT 44 04/17/2017   I have reviewed the labs  Pertinent Imaging: Results for [name], [name] (MRN [id number]) as of 07/11/2018 14:21  Ref. Range 07/11/2018 14:03  Scan Result Unknown 11   CLINICAL DATA:  History of nephrolithiasis. History of pancreatitis.  EXAM: ABDOMEN - 1 VIEW  COMPARISON:  Apr 11, 2017  FINDINGS: Moderate fecal loading throughout the colon. Both kidneys are partially obscured by bowel contents but no renal stones are noted. Calcifications in the pelvis are stable, consistent with phleboliths. No ureteral stones identified.  IMPRESSION: Moderate fecal loading in the colon. No other acute abnormalities. No definitive renal or ureteral stones.   Electronically Signed   By: Dorise Bullion III M.D   On: 07/12/2018  08:07    Assessment & Plan:    1. BPH with LUTS  - Continue conservative management, avoiding bladder irritants and timed voiding's  - most bothersome symptoms is/are weak stream and nocturia  - continue finasteride 5 mg daily  - RTC in 12 months for I PSS, exam and PVR  2. History of nephrolithiasis  -  Bilateral nephrolithiasis seen on CT in 2011  - No stones seen on recent KUB   3. History of bladder calculus  - see above  Return in about 1 year (around 07/12/2019) for KUB and recheck .  These notes generated with voice recognition software. I apologize for typographical errors.  Zara Council, PA-C  [facility] [address] [place], [place] [address] [phone]

## 2018-07-11 ENCOUNTER — Ambulatory Visit (INDEPENDENT_AMBULATORY_CARE_PROVIDER_SITE_OTHER): Payer: Medicare Other | Admitting: Urology

## 2018-07-11 ENCOUNTER — Ambulatory Visit
Admission: RE | Admit: 2018-07-11 | Discharge: 2018-07-11 | Disposition: A | Discharge status: Home / Self Care | Payer: Medicare Other | Source: Ambulatory Visit | Attending: Urology | Admitting: Urology

## 2018-07-11 ENCOUNTER — Encounter: Payer: Self-pay | Admitting: Urology

## 2018-07-11 VITALS — BP 107/69 | HR 65 | Ht 69.0 in | Wt 168.0 lb

## 2018-07-11 DIAGNOSIS — Z87442 Personal history of urinary calculi: Secondary | ICD-10-CM | POA: Diagnosis present

## 2018-07-11 DIAGNOSIS — N401 Enlarged prostate with lower urinary tract symptoms: Secondary | ICD-10-CM | POA: Diagnosis not present

## 2018-07-11 DIAGNOSIS — Z87448 Personal history of other diseases of urinary system: Secondary | ICD-10-CM

## 2018-07-11 DIAGNOSIS — N138 Other obstructive and reflux uropathy: Secondary | ICD-10-CM | POA: Diagnosis not present

## 2018-07-11 LAB — BLADDER SCAN AMB NON-IMAGING: Scan Result: 11

## 2018-07-11 MED ORDER — FINASTERIDE 5 MG PO TABS: 5.0000 mg | ORAL_TABLET | Freq: Every day | ORAL | 3 refills | Status: DC

## 2018-07-12 ENCOUNTER — Telehealth: Payer: Self-pay

## 2018-07-12 LAB — PSA: Prostate Specific Ag, Serum: 0.4 ng/mL (ref 0.0–4.0)

## 2018-07-12 NOTE — Telephone Encounter (Signed)
-----   Message from Nori Riis, PA-C sent at 07/12/2018  7:47 AM EDT ----- Please let [name] know that his PSA is stable at 0.4.

## 2018-07-12 NOTE — Telephone Encounter (Signed)
Patient notified on vmail 

## 2018-07-12 NOTE — Telephone Encounter (Signed)
-----   Message from Nori Riis, PA-C sent at 07/12/2018 10:03 AM EDT ----- Please let Mr. Pursell know that his x-ray was negative for stones.

## 2018-07-12 NOTE — Telephone Encounter (Signed)
Pt caregiver informed.  

## 2018-07-19 ENCOUNTER — Telehealth: Payer: Self-pay | Admitting: Urology

## 2018-07-19 ENCOUNTER — Other Ambulatory Visit: Payer: Self-pay | Admitting: Urology

## 2018-07-19 DIAGNOSIS — Z87442 Personal history of urinary calculi: Secondary | ICD-10-CM

## 2018-07-19 NOTE — Progress Notes (Signed)
Order in for KUB

## 2018-07-19 NOTE — Telephone Encounter (Signed)
Patient was instructed to follow up in 1 year with a KUB prior  I need a future order for this in his chart    Sharyn Lull

## 2018-12-15 DIAGNOSIS — Z8781 Personal history of (healed) traumatic fracture: Secondary | ICD-10-CM | POA: Insufficient documentation

## 2019-01-17 ENCOUNTER — Other Ambulatory Visit: Payer: Self-pay

## 2019-01-17 ENCOUNTER — Encounter: Payer: Self-pay | Admitting: *Deleted

## 2019-01-17 ENCOUNTER — Inpatient Hospital Stay
Admission: EM | Admit: 2019-01-17 | Discharge: 2019-01-20 | DRG: 644 | Disposition: A | Discharge status: Nursing Home (Includes ICF) | Payer: Medicare Other | Source: Ambulatory Visit | Attending: Internal Medicine | Admitting: Internal Medicine

## 2019-01-17 DIAGNOSIS — Z79891 Long term (current) use of opiate analgesic: Secondary | ICD-10-CM

## 2019-01-17 DIAGNOSIS — N400 Enlarged prostate without lower urinary tract symptoms: Secondary | ICD-10-CM | POA: Diagnosis present

## 2019-01-17 DIAGNOSIS — F0390 Unspecified dementia without behavioral disturbance: Secondary | ICD-10-CM | POA: Diagnosis present

## 2019-01-17 DIAGNOSIS — F17210 Nicotine dependence, cigarettes, uncomplicated: Secondary | ICD-10-CM | POA: Diagnosis present

## 2019-01-17 DIAGNOSIS — Z794 Long term (current) use of insulin: Secondary | ICD-10-CM

## 2019-01-17 DIAGNOSIS — E209 Hypoparathyroidism, unspecified: Principal | ICD-10-CM | POA: Diagnosis present

## 2019-01-17 DIAGNOSIS — I10 Essential (primary) hypertension: Secondary | ICD-10-CM | POA: Diagnosis present

## 2019-01-17 DIAGNOSIS — F209 Schizophrenia, unspecified: Secondary | ICD-10-CM | POA: Diagnosis present

## 2019-01-17 DIAGNOSIS — Z79899 Other long term (current) drug therapy: Secondary | ICD-10-CM

## 2019-01-17 DIAGNOSIS — E785 Hyperlipidemia, unspecified: Secondary | ICD-10-CM | POA: Diagnosis present

## 2019-01-17 DIAGNOSIS — E1122 Type 2 diabetes mellitus with diabetic chronic kidney disease: Secondary | ICD-10-CM | POA: Diagnosis present

## 2019-01-17 DIAGNOSIS — N184 Chronic kidney disease, stage 4 (severe): Secondary | ICD-10-CM | POA: Diagnosis present

## 2019-01-17 DIAGNOSIS — E119 Type 2 diabetes mellitus without complications: Secondary | ICD-10-CM

## 2019-01-17 DIAGNOSIS — Z7951 Long term (current) use of inhaled steroids: Secondary | ICD-10-CM

## 2019-01-17 DIAGNOSIS — I129 Hypertensive chronic kidney disease with stage 1 through stage 4 chronic kidney disease, or unspecified chronic kidney disease: Secondary | ICD-10-CM | POA: Diagnosis present

## 2019-01-17 DIAGNOSIS — Z87442 Personal history of urinary calculi: Secondary | ICD-10-CM

## 2019-01-17 LAB — CBC
HCT: 35.2 % — ABNORMAL LOW (ref 39.0–52.0)
HEMOGLOBIN: 11.1 g/dL — AB (ref 13.0–17.0)
MCH: 29.7 pg (ref 26.0–34.0)
MCHC: 31.5 g/dL (ref 30.0–36.0)
MCV: 94.1 fL (ref 80.0–100.0)
Platelets: 271 10*3/uL (ref 150–400)
RBC: 3.74 MIL/uL — ABNORMAL LOW (ref 4.22–5.81)
RDW: 14.6 % (ref 11.5–15.5)
WBC: 4 10*3/uL (ref 4.0–10.5)
nRBC: 0 % (ref 0.0–0.2)

## 2019-01-17 LAB — BASIC METABOLIC PANEL WITH GFR
Anion gap: 12 (ref 5–15)
BUN: 48 mg/dL — ABNORMAL HIGH (ref 8–23)
CO2: 18 mmol/L — ABNORMAL LOW (ref 22–32)
Calcium: 5.5 mg/dL — CL (ref 8.9–10.3)
Chloride: 112 mmol/L — ABNORMAL HIGH (ref 98–111)
Creatinine, Ser: 3.29 mg/dL — ABNORMAL HIGH (ref 0.61–1.24)
GFR calc Af Amer: 21 mL/min — ABNORMAL LOW
GFR calc non Af Amer: 18 mL/min — ABNORMAL LOW
Glucose, Bld: 103 mg/dL — ABNORMAL HIGH (ref 70–99)
Potassium: 4.8 mmol/L (ref 3.5–5.1)
Sodium: 142 mmol/L (ref 135–145)

## 2019-01-17 LAB — MAGNESIUM: Magnesium: 1.7 mg/dL (ref 1.7–2.4)

## 2019-01-17 MED ORDER — CALCIUM GLUCONATE 10 % IV SOLN
1.0000 g | Freq: Once | INTRAVENOUS | Status: DC
  Filled 2019-01-17 (×2): qty 10

## 2019-01-17 MED ORDER — CALCIUM GLUCONATE-NACL 1-0.675 GM/50ML-% IV SOLN
1.0000 g | Freq: Once | INTRAVENOUS | Status: AC
  Administered 2019-01-17: 1000 mg via INTRAVENOUS
  Filled 2019-01-17: qty 50

## 2019-01-17 MED ORDER — CALCIUM GLUCONATE-NACL 1-0.675 GM/50ML-% IV SOLN
1.0000 g | Freq: Once | INTRAVENOUS | Status: AC
  Administered 2019-01-18: 1000 mg via INTRAVENOUS
  Filled 2019-01-17: qty 50

## 2019-01-17 MED ORDER — CALCIUM GLUCONATE 10 % IV SOLN: 1.0000 g | Freq: Once | INTRAVENOUS | Status: DC

## 2019-01-17 NOTE — H&P (Signed)
[place] [facility] [place] NAME: [name]    MR#:  [id number]  DATE OF BIRTH:  1946-09-25  DATE OF ADMISSION:  01/17/2019  PRIMARY CARE PHYSICIAN: [facility]   REQUESTING/REFERRING PHYSICIAN: Mariea Clonts, MD  CHIEF COMPLAINT:   Chief Complaint  Patient presents with  . Hyperparathyroidism    HISTORY OF PRESENT ILLNESS:  [name]  is a 73 y.o. male who presents with chief complaint as above.  Patient presents the ED today after routine office visit in the outpatient setting found his calcium to be low.  Patient had parathyroidectomy performed about 3 weeks ago due to hyperparathyroidism.  He had an adenoma removed at that time.  His calcium is 5.5 today.  Hospitalist were called for admission and further evaluation and treatment  PAST MEDICAL HISTORY:   Past Medical History:  Diagnosis Date  . Alcoholism ([place])   . Allergy   . Altered mental status 2011   hx of 2/2 hypercalcemia, EtOH w/d and medication overuse  . Angina    mild pain on arm, left chest, moves down left chest  . Chronic renal insufficiency    with history of acute on chronic secondary to dehydration and hypercalcemia  . Dementia ([place])   . DM (diabetes mellitus) ([place])   . Encephalopathy   . Fx humeral neck 2008   right side, no history of surgical repair  . History of nephrolithiasis 2008   s/p ureterolithotomy   . Hypercalcemia    2/2 primary hyperparathyroidism  . Hypertension   . Pancreatitis 2011   history of several admissions for pancreatitis   . Parathyroid adenoma   . Primary hyperparathyroidism ([place])    s/p parathyroid adenoma resection Oct. 8, 2011, repeat scan showed residual right adenoma, however pt did not f/u with surgery as outpt  . Schizophrenia ([place])    previously on zyprexa  . Tobacco abuse      PAST SURGICAL HISTORY:   Past Surgical History:  Procedure Laterality Date  . Ankle Pinning    . cysto,  removal of bladder stone, attempted stent placement  08/15/2007  . cystoscopy, laser cystolithoplaxy  08/16/2007  . KIDNEY STONE SURGERY  07/2007 X2   /E-chart  . open left proximal ureterolithotomy  09/18/2007  . parathyroid adenoma resection  Oct. 8, 2011   residual adenoma Oct. 12, 2011  . PARATHYROIDECTOMY  03/09/2012   Procedure: PARATHYROIDECTOMY;  Surgeon: Pedro Earls, MD;  Location: [place];  Service: General;  Laterality: N/A;  mediastinal paraqthyroidectomy   . THYROIDECTOMY  03/09/12     SOCIAL HISTORY:   Social History   Tobacco Use  . Smoking status: Current Every Day Smoker    Packs/day: 1.00    Years: 50.00    Pack years: 50.00    Types: Cigarettes  . Smokeless tobacco: Never Used  Substance Use Topics  . Alcohol use: No    Comment: 03/09/12 "last alcohol 1983"; history of heavy alcohol use     FAMILY HISTORY:   Family History  Problem Relation Age of Onset  . Kidney cancer Neg Hx   . Bladder Cancer Neg Hx   . Prostate cancer Neg Hx      DRUG ALLERGIES:  No Known Allergies  MEDICATIONS AT HOME:   Prior to Admission medications   Medication Sig Start Date End Date Taking? Authorizing Provider  amLODipine (NORVASC) 2.5 MG tablet Take 2.5 mg by mouth Nightly.   Yes [provider]  atorvastatin (LIPITOR) 20 MG tablet Take 1 tablet (20 mg total) by mouth daily. 03/02/17  Yes Juline Patch, MD  cinacalcet (SENSIPAR) 30 MG tablet Take 30 mg by mouth daily.   Yes [provider]  denosumab (PROLIA) 60 MG/ML SOSY injection Inject 1 mL into the skin every 6 (six) months. 11/23/18  Yes [provider]  docusate sodium (COLACE) 100 MG capsule Take 1 capsule (100 mg total) by mouth 2 (two) times daily. 03/02/17  Yes Juline Patch, MD  ergocalciferol (VITAMIN D2) 50000 units capsule Take 1 capsule (50,000 Units total) by mouth every 6 (six) weeks. 03/02/17  Yes Juline Patch, MD  finasteride (PROSCAR) 5 MG tablet Take 1 tablet (5 mg  total) by mouth daily. 07/11/18  Yes McGowan, Larene Beach A, PA-C  folic acid (FOLVITE) 1 MG tablet Take 1 tablet (1 mg total) by mouth daily. 03/02/17  Yes Juline Patch, MD  Insulin Glargine (LANTUS SOLOSTAR) 100 UNIT/ML Solostar Pen Inject 10 Units into the skin Nightly.   Yes [provider]  labetalol (NORMODYNE) 100 MG tablet Take 1 tablet (100 mg total) by mouth 2 (two) times daily. 03/02/17  Yes Juline Patch, MD  lisinopril (PRINIVIL,ZESTRIL) 5 MG tablet Take 5 mg by mouth daily.   Yes [provider]  MELATONIN TR 1 MG TBCR TAKE 1 TABLET BY MOUTH EVERY NIGHT AT BEDTIME 05/17/17  Yes Juline Patch, MD  metoprolol succinate (TOPROL-XL) 25 MG 24 hr tablet TAKE 1 TABLET BY MOUTH DAILY 05/17/17  Yes Juline Patch, MD  Multiple Vitamins-Minerals (MULTIVITAMIN WITH MINERALS) tablet TAKE 1 TABLET BY MOUTH DAILY 04/17/17  Yes Juline Patch, MD  OLANZapine (ZYPREXA) 15 MG tablet Take 2 tablets by mouth at bedtime. 01/27/16  Yes [provider]  pioglitazone (ACTOS) 30 MG tablet Take 30 mg by mouth daily. 05/01/18  Yes [provider]  thiamine (VITAMIN B-1) 100 MG tablet Take 1 tablet (100 mg total) by mouth daily. 03/02/17  Yes Juline Patch, MD  fluticasone (FLONASE) 50 MCG/ACT nasal spray USE 2 SPRAYS IN EACH NOSTRIL DAILY AS NEEDED 03/02/17   Juline Patch, MD  glimepiride (AMARYL) 4 MG tablet Take 1 tablet (4 mg total) by mouth 2 (two) times daily with a meal. Patient not taking: Reported on 01/17/2019 04/19/17   Dustin Flock, MD  glucose blood test strip 1 each by Other route 2 (two) times daily. Use as instructed 10/05/16   Juline Patch, MD  oxyCODONE-acetaminophen (PERCOCET) 5-325 MG tablet Take 1 tablet by mouth every 4 (four) hours as needed for severe pain. 04/04/18   Schuyler Amor, MD  polyethylene glycol powder (GLYCOLAX/MIRALAX) powder MIX 17 GRAMS (1 CAPFUL) IN 8 OUNCES OF WATER & DRINK DAILY Patient not taking: Reported on 07/12/2017 05/17/17   Juline Patch, MD  TRUEPLUS LANCETS 26G MISC 1 each by Does not apply route 2 (two) times daily. 10/05/16   Juline Patch, MD    REVIEW OF SYSTEMS:  Review of Systems  Constitutional: Negative for chills, fever, malaise/fatigue and weight loss.  HENT: Negative for ear pain, hearing loss and tinnitus.   Eyes: Negative for blurred vision, double vision, pain and redness.  Respiratory: Negative for cough, hemoptysis and shortness of breath.   Cardiovascular: Negative for chest pain, palpitations, orthopnea and leg swelling.  Gastrointestinal: Negative for abdominal pain, constipation, diarrhea, nausea and vomiting.  Genitourinary: Negative for dysuria, frequency and hematuria.  Musculoskeletal: Negative  for back pain, joint pain and neck pain.  Skin:       No acne, rash, or lesions  Neurological: Negative for dizziness, tremors, focal weakness and weakness.  Endo/Heme/Allergies: Negative for polydipsia. Does not bruise/bleed easily.  Psychiatric/Behavioral: Negative for depression. The patient is not nervous/anxious and does not have insomnia.      VITAL SIGNS:   Vitals:   01/17/19 1606 01/17/19 1843 01/17/19 1922 01/17/19 2110  BP: (!) 158/78 (!) 165/74 (!) 176/93 (!) 145/81  Pulse: 67 66 67 69  Resp: 16 14 20 17   Temp: 98.6 F (37 C)     TempSrc: Oral     SpO2: 99% 100% 99% 98%  Weight:      Height:       Wt Readings from Last 3 Encounters:  01/17/19 76.2 kg  07/11/18 76.2 kg  07/12/17 73.8 kg    PHYSICAL EXAMINATION:  Physical Exam  Vitals reviewed. Constitutional: He is oriented to person, place, and time. He appears well-developed and well-nourished. No distress.  HENT:  Head: Normocephalic and atraumatic.  Mouth/Throat: Oropharynx is clear and moist.  Eyes: Pupils are equal, round, and reactive to light. Conjunctivae and EOM are normal. No scleral icterus.  Neck: Normal range of motion. Neck supple. No JVD present. No thyromegaly present.  Cardiovascular: Normal rate,  regular rhythm and intact distal pulses. Exam reveals no gallop and no friction rub.  No murmur heard. Respiratory: Effort normal and breath sounds normal. No respiratory distress. He has no wheezes. He has no rales.  GI: Soft. Bowel sounds are normal. He exhibits no distension. There is no abdominal tenderness.  Musculoskeletal: Normal range of motion.        General: No edema.     Comments: No arthritis, no gout  Lymphadenopathy:    He has no cervical adenopathy.  Neurological: He is alert and oriented to person, place, and time. No cranial nerve deficit.  No dysarthria, no aphasia  Skin: Skin is warm and dry. No rash noted. No erythema.  Psychiatric: He has a normal mood and affect. His behavior is normal. Judgment and thought content normal.    LABORATORY PANEL:   CBC Recent Labs  Lab 01/17/19 1620  WBC 4.0  HGB 11.1*  HCT 35.2*  PLT 271   ------------------------------------------------------------------------------------------------------------------  Chemistries  Recent Labs  Lab 01/17/19 1620  NA 142  K 4.8  CL 112*  CO2 18*  GLUCOSE 103*  BUN 48*  CREATININE 3.29*  CALCIUM 5.5*  MG 1.7   ------------------------------------------------------------------------------------------------------------------  Cardiac Enzymes No results for input(s): TROPONINI in the last 168 hours. ------------------------------------------------------------------------------------------------------------------  RADIOLOGY:  No results found.  EKG:   Orders placed or performed during the hospital encounter of 01/17/19  . EKG 12-Lead  . EKG 12-Lead    IMPRESSION AND PLAN:  Principal Problem:   Hypocalcemia -calcium given in the ED.  Vitamin D and PTH labs ordered.  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  Follow-up on labs and order calcium and vitamin D replacement as needed Active Problems:    Hypertension -continue home meds   Type 2 diabetes mellitus without complication, without long-term current use of insulin (HCC) -sliding scale insulin coverage   CKD (chronic kidney disease) stage 4, GFR 15-29 ml/min (HCC) -at baseline, avoid nephrotoxins and monitor   Hyperlipidemia -home dose antilipid  Chart review performed and case discussed with ED provider. Labs, imaging and/or ECG reviewed by provider and  discussed with patient/family. Management plans discussed with the patient and/or family.  DVT PROPHYLAXIS: SubQ heparin  GI PROPHYLAXIS:  None  ADMISSION STATUS: Observation  CODE STATUS: Full Code Status History    Date Active Date Inactive Code Status Order ID Comments User Context   04/17/2017 1928 04/19/2017 1959 Full Code [id number]  Dustin Flock, MD Inpatient   09/10/2013 1420 09/12/2013 1609 Full Code [id number]  Hoy Morn, MD ED   03/09/2012 1256 03/10/2012 1752 Full Code [id number]  Midge Aver, RN Inpatient   01/05/2012 0054 01/07/2012 2117 Full Code [id number]  Tawanna Solo, RN Inpatient   11/09/2011 1030 11/14/2011 1735 Full Code [id number]  Cheryln Manly, RN ED      TOTAL TIME TAKING CARE OF THIS PATIENT: 40 minutes.   Ethlyn Daniels 01/17/2019, 9:37 PM  [facility]  Office  [phone]  CC: Primary care physician; [facility], [facility]  Note:  This document was prepared using Dragon voice recognition software and may include unintentional dictation errors.

## 2019-01-17 NOTE — ED Notes (Signed)
Lab notified at this time of Magnesium lab test as ordered by Dr Mariea Clonts. Lab verbalizes they have sufficient blood left from an earlier collection and will begin testing at this time.

## 2019-01-17 NOTE — ED Triage Notes (Addendum)
Pt to ED via EMS form group home after [facility] reportedly called group home to have patient brought to the hospital after some abnormal lab values form pts visit today. Pt presents with paperwork describing a parathyroid, left superior, parathyroidectomy - Hypercellular parathyroid tissue, 2.2g compatible with adenoma from 01/07/2019. Unknown what blood work was run at [facility] today. Pt reports he is feeling "alright" and has no symptoms at this time.   Written on paperwork that pts calcium is 5.5

## 2019-01-17 NOTE — ED Provider Notes (Addendum)
[facility] Emergency Department Provider Note  ____________________________________________  Time seen: Approximately 8:23 PM  I have reviewed the triage vital signs and the nursing notes.   HISTORY  Chief Complaint Hyperparathyroidism    HPI [name] is a 73 y.o. male with a history of schizophrenia and metabolic encephalopathy, dementia, left parathyroidectomy 12/31/2018 brought to the emergency department for hyper case calcium you.  The patient is unable to give me any history; his understanding is "something is wrong with my blood."  Based on Dr. Purcell Nails Kim's note from [facility], the patient went for a routine postoperative office visit today.  He had laboratory studies done which showed a calcium of 5.5 and was referred to the emergency department.  The patient is completely asymptomatic.  Past Medical History:  Diagnosis Date  . Alcoholism ([place])   . Allergy   . Altered mental status 2011   hx of 2/2 hypercalcemia, EtOH w/d and medication overuse  . Angina    mild pain on arm, left chest, moves down left chest  . Chronic renal insufficiency    with history of acute on chronic secondary to dehydration and hypercalcemia  . Dementia ([place])   . DM (diabetes mellitus) ([place])   . Encephalopathy   . Fx humeral neck 2008   right side, no history of surgical repair  . History of nephrolithiasis 2008   s/p ureterolithotomy   . Hypercalcemia    2/2 primary hyperparathyroidism  . Hypertension   . Pancreatitis 2011   history of several admissions for pancreatitis   . Parathyroid adenoma   . Primary hyperparathyroidism ([place])    s/p parathyroid adenoma resection Oct. 8, 2011, repeat scan showed residual right adenoma, however pt did not f/u with surgery as outpt  . Schizophrenia ([place])    previously on zyprexa  . Tobacco abuse     Patient Active Problem List   Diagnosis Date Noted  . Hyperglycemia 04/17/2017  . Folic acid deficiency 26/94/8546  .  Chronic constipation 03/02/2017  . Thiamine deficiency 03/02/2017  . Chronic seasonal allergic rhinitis due to pollen 03/02/2017  . Type 2 diabetes mellitus without complication, without long-term current use of insulin ([place]) 03/02/2017  . Lives in long-term care facility 03/02/2017  . Hyperlipidemia 03/02/2017  . Vitamin D deficiency 03/02/2017  . Anemia 03/02/2017  . Encephalopathy, metabolic 27/01/5008  . CKD (chronic kidney disease) stage 4, GFR 15-29 ml/min (HCC) 01/05/2012  . Tobacco abuse 01/05/2012  . Metabolic acidosis 38/18/2993  . Pancreatitis, hx of  11/09/2011  . Schizophrenia ([place]) 11/09/2011  . History of nephrolithiasis 11/09/2011  . 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 Hypertension 11/09/2011  . Alcohol abuse 11/09/2011  . Altered mental status 11/09/2011  . UTI (lower urinary tract infection) 11/09/2011  . Acute on chronic renal insufficiency 11/09/2011    Past Surgical History:  Procedure Laterality Date  . Ankle Pinning    . cysto, removal of bladder stone, attempted stent placement  08/15/2007  . cystoscopy, laser cystolithoplaxy  08/16/2007  . KIDNEY STONE SURGERY  07/2007 X2   /E-chart  . open left proximal ureterolithotomy  09/18/2007  . parathyroid adenoma resection  Oct. 8, 2011   residual adenoma Oct. 12, 2011  . PARATHYROIDECTOMY  03/09/2012   Procedure: PARATHYROIDECTOMY;  Surgeon: Pedro Earls, MD;  Location: [place];  Service: General;  Laterality: N/A;  mediastinal paraqthyroidectomy   . THYROIDECTOMY  03/09/12    Current Outpatient Rx  . Order #: [id number] Class: Normal  .  Order #: [id number] Class: Normal  . Order #: [id number] Class: Normal  . Order #: [id number] Class: Normal  . Order #: [id number] Class: Historical Med  . Order #: [id number] Class: Normal  . Order #: [id number] Class: Normal  . Order #: [id number] Class: Normal  . Order #: [id number] Class: Normal  . Order #: [id number] Class: Normal  . Order #: [id number] Class: Normal  .  Order #: [id number] Class: Normal  . Order #: [id number] Class: Normal  . Order #: [id number] Class: Normal  . Order #: [id number] Class: Historical Med  . Order #: [id number] Class: Normal  . Order #: [id number] Class: Normal  . Order #: [id number] Class: Historical Med  . Order #: [id number] Class: Normal  . Order #: [id number] Class: Historical Med  . Order #: [id number] Class: Print  . Order #: [id number] Class: Normal  . Order #: [id number] Class: Normal  . Order #: [id number] Class: Normal    Allergies Patient has no known allergies.  Family History  Problem Relation Age of Onset  . Kidney cancer Neg Hx   . Bladder Cancer Neg Hx   . Prostate cancer Neg Hx     Social History Social History   Tobacco Use  . Smoking status: Current Every Day Smoker    Packs/day: 1.00    Years: 50.00    Pack years: 50.00    Types: Cigarettes  . Smokeless tobacco: Never Used  Substance Use Topics  . Alcohol use: No    Comment: 03/09/12 "last alcohol 1983"; history of heavy alcohol use  . Drug use: No    Types: Marijuana    Comment: 03/09/12 Last drug use "~1978"    Review of Systems Constitutional: No fever/chills.  No lightheadedness or syncope. Eyes: No visual changes. ENT: No sore throat. No congestion or rhinorrhea. Cardiovascular: Denies chest pain. Denies palpitations. Respiratory: Denies shortness of breath.  No cough. Gastrointestinal: No abdominal pain.  No nausea, no vomiting.  No diarrhea.  No constipation. Genitourinary: Negative for dysuria. Musculoskeletal: Negative for back pain. Skin: Negative for rash. Neurological: Negative for headaches. No focal numbness, tingling or weakness.  Endocrine:Hypocalcemia.    ____________________________________________   PHYSICAL EXAM:  VITAL SIGNS: ED Triage Vitals  Enc Vitals Group     BP 01/17/19 1606 (!) 158/78     Pulse Rate 01/17/19 1606 67     Resp 01/17/19 1606 16     Temp 01/17/19 1606 98.6 F (37 C)     Temp Source 01/17/19 1606  Oral     SpO2 01/17/19 1606 99 %     Weight 01/17/19 1602 168 lb (76.2 kg)     Height 01/17/19 1602 5\' 11"  (1.803 m)     Head Circumference --      Peak Flow --      Pain Score 01/17/19 1601 0     Pain Loc --      Pain Edu? --      Excl. in [place]? --     Constitutional: The patient is alert with clear speech but not completely oriented.  He is able to follow basic commands without any difficulty. Eyes: Conjunctivae are normal.  EOMI. No scleral icterus. Head: Atraumatic. Nose: No congestion/rhinnorhea. Mouth/Throat: Mucous membranes are moist.  Neck: No stridor.  Supple.  No JVD.  No meningismus. Cardiovascular: Normal rate, regular rhythm. No murmurs, rubs or gallops.  Respiratory: Normal respiratory effort.  No accessory muscle use or retractions. Lungs CTAB.  No wheezes, rales or ronchi. Gastrointestinal: Soft, nontender and nondistended.  No guarding or rebound.  No peritoneal signs. Musculoskeletal: No LE edema.  Neurologic:  Alert.  Speech is clear.  Face and smile are symmetric.  EOMI.  Moves all extremities well. Skin:  Skin is warm, dry and intact. No rash noted. Psychiatric: Mood and affect are normal. Speech and behavior are normal.  Normal judgement.  ____________________________________________   LABS (all labs ordered are listed, but only abnormal results are displayed)  Labs Reviewed  CBC - Abnormal; Notable for the following components:      Result Value   RBC 3.74 (*)    Hemoglobin 11.1 (*)    HCT 35.2 (*)    All other components within normal limits  BASIC METABOLIC PANEL - Abnormal; Notable for the following components:   Chloride 112 (*)    CO2 18 (*)    Glucose, Bld 103 (*)    BUN 48 (*)    Creatinine, Ser 3.29 (*)    Calcium 5.5 (*)    GFR calc non Af Amer 18 (*)    GFR calc Af Amer 21 (*)    All other components within normal limits  MAGNESIUM   ____________________________________________  EKG  ED ECG REPORT I, Anne-Caroline Mariea Clonts, the  attending physician, personally viewed and interpreted this ECG.   Date: 01/17/2019  EKG Time: 1811  Rate: 65  Rhythm: normal sinus rhythm  Axis: leftward  Intervals:prolonged ST segment c/w hypocalcemia  ST&T Change: No STEMI  ____________________________________________  RADIOLOGY  No results found.  ____________________________________________   PROCEDURES  Procedure(s) performed: None  Procedures  Critical Care performed: No ____________________________________________   INITIAL IMPRESSION / ASSESSMENT AND PLAN / ED COURSE  Pertinent labs & imaging results that were available during my care of the patient were reviewed by me and considered in my medical decision making (see chart for details).  73 y.o. male with a history of parathyroid adenoma status post parathyroidectomy 12/31/2018 presenting to the emergency department with hypocalcemia found at routine blood work.  The patient is asymptomatic and hemodynamically stable.  He does have ST prolongation on his EKG.  At this time, I have ordered 2 g of calcium carbonate I will admit the patient to the hospitalist for ongoing treatment.  I have attempted to call the endocrinologist on-call, and I am still awaiting a phone call back.  ----------------------------------------- 9:10 PM on 01/17/2019 -----------------------------------------  I have admitted the patient to the hospitalist.  I have reached Dr. Honor Junes, the endocrinologist on-call, who recommends obtaining a PTH, vitamin D 25 and vitamin D1/25.  Have let the hospitalist know that I have spoken with the endocrinologist.  ____________________________________________  FINAL CLINICAL IMPRESSION(S) / ED DIAGNOSES  Final diagnoses:  Hypocalcemia         NEW MEDICATIONS STARTED DURING THIS VISIT:  New Prescriptions   No medications on file      Eula Listen, MD 01/17/19 2030    Eula Listen, MD 01/17/19 2111

## 2019-01-17 NOTE — ED Notes (Signed)
Patient denies any type of symptoms at this time

## 2019-01-18 DIAGNOSIS — E209 Hypoparathyroidism, unspecified: Secondary | ICD-10-CM | POA: Diagnosis present

## 2019-01-18 DIAGNOSIS — Z79891 Long term (current) use of opiate analgesic: Secondary | ICD-10-CM | POA: Diagnosis not present

## 2019-01-18 DIAGNOSIS — Z7951 Long term (current) use of inhaled steroids: Secondary | ICD-10-CM | POA: Diagnosis not present

## 2019-01-18 DIAGNOSIS — N400 Enlarged prostate without lower urinary tract symptoms: Secondary | ICD-10-CM | POA: Diagnosis present

## 2019-01-18 DIAGNOSIS — E1122 Type 2 diabetes mellitus with diabetic chronic kidney disease: Secondary | ICD-10-CM | POA: Diagnosis present

## 2019-01-18 DIAGNOSIS — Z87442 Personal history of urinary calculi: Secondary | ICD-10-CM | POA: Diagnosis not present

## 2019-01-18 DIAGNOSIS — E785 Hyperlipidemia, unspecified: Secondary | ICD-10-CM | POA: Diagnosis present

## 2019-01-18 DIAGNOSIS — F209 Schizophrenia, unspecified: Secondary | ICD-10-CM | POA: Diagnosis present

## 2019-01-18 DIAGNOSIS — N184 Chronic kidney disease, stage 4 (severe): Secondary | ICD-10-CM | POA: Diagnosis present

## 2019-01-18 DIAGNOSIS — F17210 Nicotine dependence, cigarettes, uncomplicated: Secondary | ICD-10-CM | POA: Diagnosis present

## 2019-01-18 DIAGNOSIS — Z794 Long term (current) use of insulin: Secondary | ICD-10-CM | POA: Diagnosis not present

## 2019-01-18 DIAGNOSIS — F0390 Unspecified dementia without behavioral disturbance: Secondary | ICD-10-CM | POA: Diagnosis present

## 2019-01-18 DIAGNOSIS — Z79899 Other long term (current) drug therapy: Secondary | ICD-10-CM | POA: Diagnosis not present

## 2019-01-18 DIAGNOSIS — I129 Hypertensive chronic kidney disease with stage 1 through stage 4 chronic kidney disease, or unspecified chronic kidney disease: Secondary | ICD-10-CM | POA: Diagnosis present

## 2019-01-18 LAB — CBC
HCT: 34.2 % — ABNORMAL LOW (ref 39.0–52.0)
Hemoglobin: 10.8 g/dL — ABNORMAL LOW (ref 13.0–17.0)
MCH: 29.4 pg (ref 26.0–34.0)
MCHC: 31.6 g/dL (ref 30.0–36.0)
MCV: 93.2 fL (ref 80.0–100.0)
Platelets: 284 10*3/uL (ref 150–400)
RBC: 3.67 MIL/uL — ABNORMAL LOW (ref 4.22–5.81)
RDW: 14.5 % (ref 11.5–15.5)
WBC: 4.7 10*3/uL (ref 4.0–10.5)
nRBC: 0 % (ref 0.0–0.2)

## 2019-01-18 LAB — BASIC METABOLIC PANEL
Anion gap: 13 (ref 5–15)
BUN: 47 mg/dL — ABNORMAL HIGH (ref 8–23)
CALCIUM: 5.8 mg/dL — AB (ref 8.9–10.3)
CO2: 19 mmol/L — ABNORMAL LOW (ref 22–32)
CREATININE: 3.21 mg/dL — AB (ref 0.61–1.24)
Chloride: 109 mmol/L (ref 98–111)
GFR calc Af Amer: 21 mL/min — ABNORMAL LOW (ref >60)
GFR calc non Af Amer: 18 mL/min — ABNORMAL LOW (ref >60)
Glucose, Bld: 103 mg/dL — ABNORMAL HIGH (ref 70–99)
Potassium: 4.9 mmol/L (ref 3.5–5.1)
Sodium: 141 mmol/L (ref 135–145)

## 2019-01-18 LAB — GLUCOSE, CAPILLARY
Glucose-Capillary: 100 mg/dL — ABNORMAL HIGH (ref 70–99)
Glucose-Capillary: 103 mg/dL — ABNORMAL HIGH (ref 70–99)
Glucose-Capillary: 161 mg/dL — ABNORMAL HIGH (ref 70–99)
Glucose-Capillary: 115 mg/dL — ABNORMAL HIGH (ref 70–99)
Glucose-Capillary: 194 mg/dL — ABNORMAL HIGH (ref 70–99)

## 2019-01-18 LAB — ALBUMIN: Albumin: 4.1 g/dL (ref 3.5–5.0)

## 2019-01-18 MED ORDER — HEPARIN SODIUM (PORCINE) 5000 UNIT/ML IJ SOLN
5000.0000 [IU] | Freq: Three times a day (TID) | INTRAMUSCULAR | Status: DC
  Administered 2019-01-18 – 2019-01-20 (×8): 5000 [IU] via SUBCUTANEOUS
  Filled 2019-01-18 (×10): qty 1

## 2019-01-18 MED ORDER — MELATONIN ER 1 MG PO TBCR: 1.0000 | EXTENDED_RELEASE_TABLET | Freq: Every day | ORAL | Status: DC

## 2019-01-18 MED ORDER — AMLODIPINE BESYLATE 5 MG PO TABS
5.0000 mg | ORAL_TABLET | Freq: Every day | ORAL | Status: DC
  Administered 2019-01-18 – 2019-01-20 (×3): 5 mg via ORAL
  Filled 2019-01-18 (×3): qty 1

## 2019-01-18 MED ORDER — ACETAMINOPHEN 325 MG PO TABS: 650.0000 mg | ORAL_TABLET | Freq: Four times a day (QID) | ORAL | Status: DC | PRN

## 2019-01-18 MED ORDER — AMLODIPINE BESYLATE 5 MG PO TABS: 2.5000 mg | ORAL_TABLET | Freq: Every evening | ORAL | Status: DC

## 2019-01-18 MED ORDER — MAGNESIUM SULFATE 2 GM/50ML IV SOLN
2.0000 g | Freq: Once | INTRAVENOUS | Status: AC
  Administered 2019-01-18: 2 g via INTRAVENOUS
  Filled 2019-01-18: qty 50

## 2019-01-18 MED ORDER — CALCIUM CARBONATE 1250 (500 CA) MG PO TABS
2.0000 | ORAL_TABLET | Freq: Three times a day (TID) | ORAL | Status: DC
  Administered 2019-01-18: 1000 mg via ORAL
  Filled 2019-01-18 (×3): qty 2

## 2019-01-18 MED ORDER — ONDANSETRON HCL 4 MG PO TABS: 4.0000 mg | ORAL_TABLET | Freq: Four times a day (QID) | ORAL | Status: DC | PRN

## 2019-01-18 MED ORDER — OLANZAPINE 10 MG PO TABS
30.0000 mg | ORAL_TABLET | Freq: Every day | ORAL | Status: DC
  Administered 2019-01-18 – 2019-01-19 (×2): 30 mg via ORAL
  Filled 2019-01-18 (×4): qty 3

## 2019-01-18 MED ORDER — MELATONIN 5 MG PO TABS
2.5000 mg | ORAL_TABLET | Freq: Every day | ORAL | Status: DC
  Administered 2019-01-18 – 2019-01-19 (×2): 2.5 mg via ORAL
  Filled 2019-01-18 (×4): qty 0.5

## 2019-01-18 MED ORDER — CALCITRIOL 0.25 MCG PO CAPS
0.5000 ug | ORAL_CAPSULE | Freq: Every day | ORAL | Status: DC
  Administered 2019-01-18 – 2019-01-20 (×3): 0.5 ug via ORAL
  Filled 2019-01-18 (×3): qty 2

## 2019-01-18 MED ORDER — METOPROLOL SUCCINATE ER 25 MG PO TB24
25.0000 mg | ORAL_TABLET | Freq: Every day | ORAL | Status: DC
  Administered 2019-01-18 – 2019-01-20 (×3): 25 mg via ORAL
  Filled 2019-01-18 (×4): qty 1

## 2019-01-18 MED ORDER — VITAMIN B-1 100 MG PO TABS
100.0000 mg | ORAL_TABLET | Freq: Every day | ORAL | Status: DC
  Administered 2019-01-18 – 2019-01-20 (×3): 100 mg via ORAL
  Filled 2019-01-18 (×3): qty 1

## 2019-01-18 MED ORDER — INSULIN ASPART 100 UNIT/ML ~~LOC~~ SOLN
0.0000 [IU] | Freq: Three times a day (TID) | SUBCUTANEOUS | Status: DC
  Administered 2019-01-18 – 2019-01-20 (×3): 2 [IU] via SUBCUTANEOUS
  Filled 2019-01-18 (×3): qty 1

## 2019-01-18 MED ORDER — ONDANSETRON HCL 4 MG/2ML IJ SOLN: 4.0000 mg | Freq: Four times a day (QID) | INTRAMUSCULAR | Status: DC | PRN

## 2019-01-18 MED ORDER — CINACALCET HCL 30 MG PO TABS
30.0000 mg | ORAL_TABLET | Freq: Every day | ORAL | Status: DC
  Administered 2019-01-18: 30 mg via ORAL
  Filled 2019-01-18: qty 1

## 2019-01-18 MED ORDER — CALCIUM GLUCONATE-NACL 1-0.675 GM/50ML-% IV SOLN
1.0000 g | Freq: Two times a day (BID) | INTRAVENOUS | Status: AC
  Administered 2019-01-18 – 2019-01-19 (×3): 1000 mg via INTRAVENOUS
  Filled 2019-01-18 (×4): qty 50

## 2019-01-18 MED ORDER — INSULIN ASPART 100 UNIT/ML ~~LOC~~ SOLN: 0.0000 [IU] | Freq: Every day | SUBCUTANEOUS | Status: DC

## 2019-01-18 MED ORDER — ATORVASTATIN CALCIUM 20 MG PO TABS
20.0000 mg | ORAL_TABLET | Freq: Every day | ORAL | Status: DC
  Administered 2019-01-18 – 2019-01-20 (×3): 20 mg via ORAL
  Filled 2019-01-18 (×3): qty 1

## 2019-01-18 MED ORDER — SODIUM CHLORIDE 0.9 % IV SOLN
INTRAVENOUS | Status: DC | PRN
  Administered 2019-01-18 – 2019-01-19 (×2): 250 mL via INTRAVENOUS

## 2019-01-18 MED ORDER — FINASTERIDE 5 MG PO TABS
5.0000 mg | ORAL_TABLET | Freq: Every day | ORAL | Status: DC
  Administered 2019-01-18 – 2019-01-20 (×3): 5 mg via ORAL
  Filled 2019-01-18 (×3): qty 1

## 2019-01-18 MED ORDER — ACETAMINOPHEN 650 MG RE SUPP: 650.0000 mg | Freq: Four times a day (QID) | RECTAL | Status: DC | PRN

## 2019-01-18 NOTE — Progress Notes (Signed)
[place], [place] 01/18/19  Subjective:   Patient known to our practice from outpatient follow-up.  He follows for chronic kidney disease stage IV Patient had left superior parathyroidectomy and umbilical hernia repair at [facility] for parathyroid adenoma He was brought to the emergency room by EMS by his group home after they were notified of abnormal lab values from [facility].   Objective:  Vital signs in last 24 hours:  Temp:  [98.4 F (36.9 C)-98.6 F (37 C)] 98.4 F (36.9 C) (02/21 1010) Pulse Rate:  [62-73] 62 (02/21 1010) Resp:  [1-20] 1 (02/21 1010) BP: (137-176)/(65-100) 161/81 (02/21 1010) SpO2:  [98 %-100 %] 100 % (02/21 1010) Weight:  [76.2 kg] 76.2 kg (02/20 1602)  Weight change:  Filed Weights   01/17/19 1602  Weight: 76.2 kg    Intake/Output:    Intake/Output Summary (Last 24 hours) at 01/18/2019 1457 Last data filed at 01/18/2019 1407 Gross per 24 hour  Intake 340 ml  Output 250 ml  Net 90 ml     Physical Exam: General:  Elderly gentleman, laying in the bed  HEENT  anicteric, moist oral mucous membranes, decreased hearing  Neck  supple  Pulm/lungs  clear to auscultation bilaterally  CVS/Heart  no rub  Abdomen:   Soft, nontender  Extremities:  No peripheral edema  Neurologic:  Alert, able to answer questions  Skin:  No acute rashes    Basic Metabolic Panel:  Recent Labs  Lab 01/17/19 1620 01/18/19 0614  NA 142 141  K 4.8 4.9  CL 112* 109  CO2 18* 19*  GLUCOSE 103* 103*  BUN 48* 47*  CREATININE 3.29* 3.21*  CALCIUM 5.5* 5.8*  MG 1.7  --      CBC: Recent Labs  Lab 01/17/19 1620 01/18/19 0614  WBC 4.0 4.7  HGB 11.1* 10.8*  HCT 35.2* 34.2*  MCV 94.1 93.2  PLT 271 284     No results found for: HEPBSAG, HEPBSAB, HEPBIGM    Microbiology:  No results found for this or any previous visit (from the past 240 hour(s)).  Coagulation Studies: No results for input(s): LABPROT, INR in the last 72  hours.  Urinalysis: No results for input(s): COLORURINE, LABSPEC, PHURINE, GLUCOSEU, HGBUR, BILIRUBINUR, KETONESUR, PROTEINUR, UROBILINOGEN, NITRITE, LEUKOCYTESUR in the last 72 hours.  Invalid input(s): APPERANCEUR    Imaging: No results found.   Medications:   . sodium chloride 250 mL (01/18/19 1241)  . calcium gluconate 1,000 mg (01/18/19 0939)   . amLODipine  5 mg Oral Daily  . atorvastatin  20 mg Oral Daily  . cinacalcet  30 mg Oral Daily  . finasteride  5 mg Oral Daily  . heparin  5,000 Units Subcutaneous Q8H  . insulin aspart  0-5 Units Subcutaneous QHS  . insulin aspart  0-9 Units Subcutaneous TID WC  . Melatonin  2.5 mg Oral QHS  . metoprolol succinate  25 mg Oral Daily  . OLANZapine  30 mg Oral QHS  . thiamine  100 mg Oral Daily   sodium chloride, acetaminophen **OR** acetaminophen, ondansetron **OR** ondansetron (ZOFRAN) IV  Assessment/ Plan:  73 y.o. African-American male with diabetes, history of recurrent alcoholic pancreatitis, BPH, kidney stones, schizophrenia, hyperparathyroidism status post parathyroidectomy 2011, February 2020 at [facility], cystoscopy and bladder stone removal 2008, chronic kidney disease stage IV  Chronic kidney disease stage IV likely secondary to atherosclerosis from hypertension and diabetes Outpatient baseline creatinine from November 08, 2018 is 2.96/GFR 24 Presenting creatinine is 3.2/GFR 21  Continue to monitor. May be able to restart lisinopril 5 mg daily (outpatient dose) once serum creatinine is deemed to be stable  Primary hyperparathyroidism status post parathyroidectomy on 12/31/2018 at [facility] now resulting in hypocalcemia I would urgently discontinue cinacalcet Agree with IV calcium supplementation Start oral calcium supplementation with activated vitamin D Follow closely   LOS: 0 [name] 2/21/20202:57 PM  [place], [place]  Note: This note was prepared with Dragon  dictation. Any transcription errors are unintentional

## 2019-01-18 NOTE — Care Management Note (Addendum)
Case Management Note  Patient Details  Name: [name] MRN: [id number] Date of Birth: June 22, 1946  Subjective/Objective:  Patient admitted to [facility] under observation status for hypocalcemia. RNCM consulted on patient to provide MOON letter and complete assessment. Patient has multiple psychiatric diagnoses which makes a full assessment difficult. Per the patient he is from "[facility]" group home. Per previous documentation patient resided at [facility] group homes. Patient has also spent time at [place]. Patient tells me his legal guardian is [name] [phone]. Per the patient he is completely independent with all activities of daily living and requires no DME. Patient has used [facility] in the past for for RN only related to his insulin medications. PCP is [facility] as [facility], per patient he thinks this is correct but unsure, does not know where he gets his medications from, tells RNCM "they do that for me over there at the home". Patient had parathyroidectomy on 2/10 at [facility] and was instructed to come to the hospital after his f/u appointment with them.                    Action/Plan:   Expected Discharge Date:                  Expected Discharge Plan:     In-House Referral:     Discharge planning Services     Post Acute Care Choice:    Choice offered to:     DME Arranged:    DME Agency:     HH Arranged:    HH Agency:     Status of Service:     If discussed at [employer] of Stay Meetings, dates discussed:    Additional Comments:  Latanya Maudlin, RN 01/18/2019, 9:03 AM

## 2019-01-18 NOTE — ED Notes (Signed)
Dr Joni Fears made aware of pt's critical Calcium level at this time as reported by lab. Calcium 5.8mg /dL

## 2019-01-18 NOTE — ED Notes (Signed)
Patient given breakfast tray. Sitting up in bed eating. Denies any further needs at this time.

## 2019-01-18 NOTE — ED Notes (Addendum)
ED TO INPATIENT HANDOFF REPORT  ED Nurse Name and Phone #:  Mickel Baas [phone]  S Name/Age/Gender [name] 74 y.o. male Room/Bed: ED04A/ED04A  Code Status   Code Status: Full Code  Home/SNF/Other [place] Patient oriented to: self, place, time and situation Is this baseline? Yes   Triage Complete: Triage complete  Chief Complaint ems  abnormal labs  Triage Note Pt to ED via EMS form group home after [facility] reportedly called group home to have patient brought to the hospital after some abnormal lab values form pts visit today. Pt presents with paperwork describing a parathyroid, left superior, parathyroidectomy - Hypercellular parathyroid tissue, 2.2g compatible with adenoma from 01/07/2019. Unknown what blood work was run at [facility] today. Pt reports he is feeling "alright" and has no symptoms at this time.   Written on paperwork that pts calcium is 5.5   Allergies No Known Allergies  Level of Care/Admitting Diagnosis ED Disposition    ED Disposition Condition [place] [facility] Area: [place] [[id number]]  Level of Care: Med-Surg [16]  Diagnosis: Hypocalcemia [275.41.ICD-9-CM]  Admitting Physician: Lance Coon [[id number]]  Attending Physician: Lance Coon [[id number]]  PT Class (Do Not Modify): Observation [104]  PT Acc Code (Do Not Modify): Observation [10022]       B Medical/Surgery History Past Medical History:  Diagnosis Date  . Alcoholism ([place])   . Allergy   . Altered mental status 2011   hx of 2/2 hypercalcemia, EtOH w/d and medication overuse  . Angina    mild pain on arm, left chest, moves down left chest  . Chronic renal insufficiency    with history of acute on chronic secondary to dehydration and hypercalcemia  . Dementia ([place])   . DM (diabetes mellitus) ([place])   . Encephalopathy   . Fx humeral neck 2008   right side, no history of surgical repair  . History of nephrolithiasis 2008   s/p ureterolithotomy   .  Hypercalcemia    2/2 primary hyperparathyroidism  . Hypertension   . Pancreatitis 2011   history of several admissions for pancreatitis   . Parathyroid adenoma   . Primary hyperparathyroidism ([place])    s/p parathyroid adenoma resection Oct. 8, 2011, repeat scan showed residual right adenoma, however pt did not f/u with surgery as outpt  . Schizophrenia ([place])    previously on zyprexa  . Tobacco abuse    Past Surgical History:  Procedure Laterality Date  . Ankle Pinning    . cysto, removal of bladder stone, attempted stent placement  08/15/2007  . cystoscopy, laser cystolithoplaxy  08/16/2007  . KIDNEY STONE SURGERY  07/2007 X2   /E-chart  . open left proximal ureterolithotomy  09/18/2007  . parathyroid adenoma resection  Oct. 8, 2011   residual adenoma Oct. 12, 2011  . PARATHYROIDECTOMY  03/09/2012   Procedure: PARATHYROIDECTOMY;  Surgeon: Pedro Earls, MD;  Location: [facility];  Service: General;  Laterality: N/A;  mediastinal paraqthyroidectomy   . THYROIDECTOMY  03/09/12     A IV Location/Drains/Wounds Patient Lines/Drains/Airways Status   Active Line/Drains/Airways    Name:   Placement date:   Placement time:   Site:   Days:   Peripheral IV 01/17/19 Left Antecubital   01/17/19    2215    Antecubital   1   Incision 03/09/12 Other (Comment) Other (Comment)   03/09/12    0958     2506          Intake/Output  Last 24 hours  Intake/Output Summary (Last 24 hours) at 01/18/2019 6195 Last data filed at 01/18/2019 0053 Gross per 24 hour  Intake 100 ml  Output -  Net 100 ml    Labs/Imaging Results for orders placed or performed during the hospital encounter of 01/17/19 (from the past 48 hour(s))  CBC     Status: Abnormal   Collection Time: 01/17/19  4:20 PM  Result Value Ref Range   WBC 4.0 4.0 - 10.5 K/uL   RBC 3.74 (L) 4.22 - 5.81 MIL/uL   Hemoglobin 11.1 (L) 13.0 - 17.0 g/dL   HCT 35.2 (L) 39.0 - 52.0 %   MCV 94.1 80.0 - 100.0 fL   MCH 29.7 26.0 - 34.0 pg   MCHC 31.5  30.0 - 36.0 g/dL   RDW 14.6 11.5 - 15.5 %   Platelets 271 150 - 400 K/uL   nRBC 0.0 0.0 - 0.2 %    Comment: Performed at [facility], [address] [place], [place] [address]  Basic metabolic panel     Status: Abnormal   Collection Time: 01/17/19  4:20 PM  Result Value Ref Range   Sodium 142 135 - 145 mmol/L   Potassium 4.8 3.5 - 5.1 mmol/L   Chloride 112 (H) 98 - 111 mmol/L   CO2 18 (L) 22 - 32 mmol/L   Glucose, Bld 103 (H) 70 - 99 mg/dL   BUN 48 (H) 8 - 23 mg/dL   Creatinine, Ser 3.29 (H) 0.61 - 1.24 mg/dL   Calcium 5.5 (LL) 8.9 - 10.3 mg/dL    Comment: CRITICAL RESULT CALLED TO, READ BACK BY AND VERIFIED WITH ALICIA GRAINGER @ 7124 ON 01/17/19 BY JUW    GFR calc non Af Amer 18 (L) >60 mL/min   GFR calc Af Amer 21 (L) >60 mL/min   Anion gap 12 5 - 15    Comment: Performed at [facility], [address] [place], [place] [address]  Magnesium     Status: None   Collection Time: 01/17/19  4:20 PM  Result Value Ref Range   Magnesium 1.7 1.7 - 2.4 mg/dL    Comment: Performed at [facility], [address] [place], [place] [address]  Glucose, capillary     Status: Abnormal   Collection Time: 01/18/19  1:43 AM  Result Value Ref Range   Glucose-Capillary 100 (H) 70 - 99 mg/dL  CBC     Status: Abnormal   Collection Time: 01/18/19  6:14 AM  Result Value Ref Range   WBC 4.7 4.0 - 10.5 K/uL   RBC 3.67 (L) 4.22 - 5.81 MIL/uL   Hemoglobin 10.8 (L) 13.0 - 17.0 g/dL   HCT 34.2 (L) 39.0 - 52.0 %   MCV 93.2 80.0 - 100.0 fL   MCH 29.4 26.0 - 34.0 pg   MCHC 31.6 30.0 - 36.0 g/dL   RDW 14.5 11.5 - 15.5 %   Platelets 284 150 - 400 K/uL   nRBC 0.0 0.0 - 0.2 %    Comment: Performed at [facility], [address] [place], [place] [address]  Basic metabolic panel     Status: Abnormal   Collection Time: 01/18/19  6:14 AM  Result Value Ref Range   Sodium 141 135 - 145 mmol/L   Potassium 4.9 3.5 - 5.1 mmol/L   Chloride 109 98 - 111 mmol/L   CO2  19 (L) 22 - 32 mmol/L   Glucose, Bld 103 (H) 70 - 99  mg/dL   BUN 47 (H) 8 - 23 mg/dL   Creatinine, Ser 3.21 (H) 0.61 - 1.24 mg/dL   Calcium 5.8 (LL) 8.9 - 10.3 mg/dL    Comment: CRITICAL RESULT CALLED TO, READ BACK BY AND VERIFIED WITH BUTCH WOODS AT [phone] 01/18/2019.PMF   GFR calc non Af Amer 18 (L) >60 mL/min   GFR calc Af Amer 21 (L) >60 mL/min   Anion gap 13 5 - 15    Comment: Performed at [facility], [address] [place], [place] [address]  Glucose, capillary     Status: Abnormal   Collection Time: 01/18/19  8:34 AM  Result Value Ref Range   Glucose-Capillary 103 (H) 70 - 99 mg/dL   No results found.  Pending Labs Unresulted Labs (From admission, onward)    Start     Ordered   01/19/19 2482  Basic metabolic panel  Tomorrow morning,   STAT     01/18/19 0808   01/17/19 2059  Calcitriol (1,25 di-OH Vit D)  Add-on,   AD     01/17/19 2058   01/17/19 5003  VITAMIN D 25 Hydroxy (Vit-D Deficiency, Fractures)  Add-on,   AD     01/17/19 2058   01/17/19 2058  Parathyroid hormone, intact (no Ca)  Add-on,   AD     01/17/19 2058          Vitals/Pain Today's Vitals   01/18/19 0146 01/18/19 0500 01/18/19 0630 01/18/19 0730  BP: (!) 144/65 (!) 153/82 (!) 141/74 (!) 157/89  Pulse: 73 69 65   Resp: 17     Temp:      TempSrc:      SpO2: 99% 98% 98%   Weight:      Height:      PainSc:        Isolation Precautions No active isolations  Medications Medications  atorvastatin (LIPITOR) tablet 20 mg (has no administration in time range)  metoprolol succinate (TOPROL-XL) 24 hr tablet 25 mg (has no administration in time range)  OLANZapine (ZYPREXA) tablet 30 mg (30 mg Oral Not Given 01/18/19 0140)  cinacalcet (SENSIPAR) tablet 30 mg (has no administration in time range)  finasteride (PROSCAR) tablet 5 mg (has no administration in time range)  thiamine (VITAMIN B-1) tablet 100 mg (has no administration in time range)  heparin injection 5,000 Units (5,000 Units  Subcutaneous Given 01/18/19 0609)  acetaminophen (TYLENOL) tablet 650 mg (has no administration in time range)    Or  acetaminophen (TYLENOL) suppository 650 mg (has no administration in time range)  ondansetron (ZOFRAN) tablet 4 mg (has no administration in time range)    Or  ondansetron (ZOFRAN) injection 4 mg (has no administration in time range)  insulin aspart (novoLOG) injection 0-9 Units (0 Units Subcutaneous Not Given 01/18/19 0838)  insulin aspart (novoLOG) injection 0-5 Units (0 Units Subcutaneous Not Given 01/18/19 0144)  Melatonin TABS 2.5 mg (has no administration in time range)  calcium gluconate 1 g/ 50 mL sodium chloride IVPB (has no administration in time range)  amLODipine (NORVASC) tablet 5 mg (has no administration in time range)  calcium gluconate 1 g/ 50 mL sodium chloride IVPB (0 g Intravenous Stopped 01/17/19 2208)  calcium gluconate 1 g/ 50 mL sodium chloride IVPB (0 g Intravenous Stopped 01/18/19 0053)    Mobility walks with person assist Low fall risk   Focused Assessments    R Recommendations: See Admitting Provider Note  Report given to:   Additional Notes: patient eats  and drinks without difficulty, continent, swallows pills well  Last Calcium 5.8 Parathyroidectomy on 01/07/2019 at [facility]

## 2019-01-18 NOTE — Progress Notes (Addendum)
[place] at [place] NAME: [name]    MR#:  [id number]  DATE OF BIRTH:  16-Jan-1946  SUBJECTIVE:  Patient without complaint, calcium remains critically low, will schedule calcium gluconate IV twice daily, consult nephrology given severe chronic kidney disease REVIEW OF SYSTEMS:  CONSTITUTIONAL: No fever, fatigue or weakness.  EYES: No blurred or double vision.  EARS, NOSE, AND THROAT: No tinnitus or ear pain.  RESPIRATORY: No cough, shortness of breath, wheezing or hemoptysis.  CARDIOVASCULAR: No chest pain, orthopnea, edema.  GASTROINTESTINAL: No nausea, vomiting, diarrhea or abdominal pain.  GENITOURINARY: No dysuria, hematuria.  ENDOCRINE: No polyuria, nocturia,  HEMATOLOGY: No anemia, easy bruising or bleeding SKIN: No rash or lesion. MUSCULOSKELETAL: No joint pain or arthritis.   NEUROLOGIC: No tingling, numbness, weakness.  PSYCHIATRY: No anxiety or depression.   ROS  DRUG ALLERGIES:  No Known Allergies  VITALS:  Blood pressure (!) 161/81, pulse 62, temperature 98.4 F (36.9 C), temperature source Oral, resp. rate (!) 1, height 5\' 11"  (1.803 m), weight 76.2 kg, SpO2 100 %.  PHYSICAL EXAMINATION:  GENERAL:  73 y.o.-year-old patient lying in the bed with no acute distress.  EYES: Pupils equal, round, reactive to light and accommodation. No scleral icterus. Extraocular muscles intact.  HEENT: Head atraumatic, normocephalic. Oropharynx and nasopharynx clear.  NECK:  Supple, no jugular venous distention. No thyroid enlargement, no tenderness.  LUNGS: Normal breath sounds bilaterally, no wheezing, rales,rhonchi or crepitation. No use of accessory muscles of respiration.  CARDIOVASCULAR: S1, S2 normal. No murmurs, rubs, or gallops.  ABDOMEN: Soft, nontender, nondistended. Bowel sounds present. No organomegaly or mass.  EXTREMITIES: No pedal edema, cyanosis, or clubbing.  NEUROLOGIC: Cranial nerves II through XII are intact.  Muscle strength 5/5 in all extremities. Sensation intact. Gait not checked.  PSYCHIATRIC: The patient is alert and oriented x 3.  SKIN: No obvious rash, lesion, or ulcer.   Physical Exam LABORATORY PANEL:   CBC Recent Labs  Lab 01/18/19 0614  WBC 4.7  HGB 10.8*  HCT 34.2*  PLT 284   ------------------------------------------------------------------------------------------------------------------  Chemistries  Recent Labs  Lab 01/17/19 1620 01/18/19 0614  NA 142 141  K 4.8 4.9  CL 112* 109  CO2 18* 19*  GLUCOSE 103* 103*  BUN 48* 47*  CREATININE 3.29* 3.21*  CALCIUM 5.5* 5.8*  MG 1.7  --    ------------------------------------------------------------------------------------------------------------------  Cardiac Enzymes No results for input(s): TROPONINI in the last 168 hours. ------------------------------------------------------------------------------------------------------------------  RADIOLOGY:  No results found.  ASSESSMENT AND PLAN:  *Acute suspected acquired hypocalcemia Stable Suspected due to developing hypoparathyroidism given history of recent parathyroidectomy 3 weeks ago Check Vitamin D, PTH, albumin level, ionized calcium, schedule calcium gluconate twice daily for now, BMP in the morning Consult nephrology given stage IV chronic kidney disease  *Chronic hypertension Uncontrolled  Increase Norvasc, vitals per routine, make changes as per necessary   *Chronic Type 2 diabetes mellitus without complication Stable Continue current regiment  *Chronic kidney disease stage IV Continue to avoid nephrotoxic agents  *Chronic Hyperlipidemia  Stable Continue statin rx   All the records are reviewed and case discussed with Care Management/Social Workerr. Management plans discussed with the patient, family and they are in agreement.  CODE STATUS: full  TOTAL TIME TAKING CARE OF THIS PATIENT: 40 minutes.     POSSIBLE D/C IN 1-2 DAYS,  DEPENDING ON CLINICAL CONDITION.   Avel Peace Amish Mintzer M.D on 01/18/2019   Between 7am to 6pm - Pager - [phone]  After 6pm go to [email or web address] - password [email or web address] [facility]  Office  [phone]  CC: Primary care physician; Center, [facility]  Note: This dictation was prepared with Dragon dictation along with smaller phrase technology. Any transcriptional errors that result from this process are unintentional.

## 2019-01-18 NOTE — Care Management Obs Status (Signed)
[facility] NOTIFICATION   Patient Details  Name: [name] MRN: [id number] Date of Birth: 1946/05/21   Medicare Observation Status Notification Given:  Yes    Oakley Orban A Josee Speece, RN 01/18/2019, 9:03 AM

## 2019-01-19 LAB — GLUCOSE, CAPILLARY
Glucose-Capillary: 113 mg/dL — ABNORMAL HIGH (ref 70–99)
Glucose-Capillary: 193 mg/dL — ABNORMAL HIGH (ref 70–99)
Glucose-Capillary: 102 mg/dL — ABNORMAL HIGH (ref 70–99)
Glucose-Capillary: 142 mg/dL — ABNORMAL HIGH (ref 70–99)

## 2019-01-19 LAB — BASIC METABOLIC PANEL
Anion gap: 10 (ref 5–15)
BUN: 47 mg/dL — ABNORMAL HIGH (ref 8–23)
CO2: 22 mmol/L (ref 22–32)
Calcium: 6.5 mg/dL — ABNORMAL LOW (ref 8.9–10.3)
Chloride: 111 mmol/L (ref 98–111)
Creatinine, Ser: 3.31 mg/dL — ABNORMAL HIGH (ref 0.61–1.24)
GFR calc Af Amer: 21 mL/min — ABNORMAL LOW (ref >60)
GFR calc non Af Amer: 18 mL/min — ABNORMAL LOW (ref >60)
Glucose, Bld: 114 mg/dL — ABNORMAL HIGH (ref 70–99)
Potassium: 4.9 mmol/L (ref 3.5–5.1)
Sodium: 143 mmol/L (ref 135–145)

## 2019-01-19 LAB — PARATHYROID HORMONE, INTACT (NO CA)
PTH: 45 pg/mL (ref 15–65)
PTH: 41 pg/mL (ref 15–65)

## 2019-01-19 LAB — VITAMIN D 25 HYDROXY (VIT D DEFICIENCY, FRACTURES): VIT D 25 HYDROXY: 28.6 ng/mL — AB (ref 30.0–100.0)

## 2019-01-19 MED ORDER — CALCIUM CARBONATE ANTACID 500 MG PO CHEW
2.0000 | CHEWABLE_TABLET | Freq: Three times a day (TID) | ORAL | Status: DC
  Administered 2019-01-19 – 2019-01-20 (×5): 400 mg via ORAL
  Filled 2019-01-19 (×5): qty 2

## 2019-01-19 NOTE — Progress Notes (Signed)
[place] at [place] NAME: [name]    MR#:  [id number]  DATE OF BIRTH:  [birth year]  SUBJECTIVE:   Patient denying any complaints   REVIEW OF SYSTEMS:  CONSTITUTIONAL: No fever, fatigue or weakness.  EYES: No blurred or double vision.  EARS, NOSE, AND THROAT: No tinnitus or ear pain.  RESPIRATORY: No cough, shortness of breath, wheezing or hemoptysis.  CARDIOVASCULAR: No chest pain, orthopnea, edema.  GASTROINTESTINAL: No nausea, vomiting, diarrhea or abdominal pain.  GENITOURINARY: No dysuria, hematuria.  ENDOCRINE: No polyuria, nocturia,  HEMATOLOGY: No anemia, easy bruising or bleeding SKIN: No rash or lesion. MUSCULOSKELETAL: No joint pain or arthritis.   NEUROLOGIC: No tingling, numbness, weakness.  PSYCHIATRY: No anxiety or depression.   ROS  DRUG ALLERGIES:  No Known Allergies  VITALS:  Blood pressure 126/64, pulse 60, temperature 98.4 F (36.9 C), temperature source Oral, resp. rate 18, height 5\' 11"  (1.803 m), weight 76.2 kg, SpO2 100 %.  PHYSICAL EXAMINATION:  GENERAL:  73 y.o.-year-old patient lying in the bed with no acute distress.  EYES: Pupils equal, round, reactive to light and accommodation. No scleral icterus. Extraocular muscles intact.  HEENT: Head atraumatic, normocephalic. Oropharynx and nasopharynx clear.  NECK:  Supple, no jugular venous distention. No thyroid enlargement, no tenderness.  LUNGS: Normal breath sounds bilaterally, no wheezing, rales,rhonchi or crepitation. No use of accessory muscles of respiration.  CARDIOVASCULAR: S1, S2 normal. No murmurs, rubs, or gallops.  ABDOMEN: Soft, nontender, nondistended. Bowel sounds present. No organomegaly or mass.  EXTREMITIES: No pedal edema, cyanosis, or clubbing.  NEUROLOGIC: Cranial nerves II through XII are intact. Muscle strength 5/5 in all extremities. Sensation intact. Gait not checked.  PSYCHIATRIC: The patient is alert and oriented x 2 SKIN:  No obvious rash, lesion, or ulcer.   Physical Exam LABORATORY PANEL:   CBC Recent Labs  Lab 01/18/19 0614  WBC 4.7  HGB 10.8*  HCT 34.2*  PLT 284   ------------------------------------------------------------------------------------------------------------------  Chemistries  Recent Labs  Lab 01/17/19 1620  01/19/19 0509  NA 142   < > 143  K 4.8   < > 4.9  CL 112*   < > 111  CO2 18*   < > 22  GLUCOSE 103*   < > 114*  BUN 48*   < > 47*  CREATININE 3.29*   < > 3.31*  CALCIUM 5.5*   < > 6.5*  MG 1.7  --   --    < > = values in this interval not displayed.   ------------------------------------------------------------------------------------------------------------------  Cardiac Enzymes No results for input(s): TROPONINI in the last 168 hours. ------------------------------------------------------------------------------------------------------------------  RADIOLOGY:  No results found.  ASSESSMENT AND PLAN:  *Acute suspected acquired hypocalcemia Suspected due to developing hypoparathyroidism given history of recent parathyroidectomy 3 weeks ago Continue calcium gluconate twice daily  Check calcium level in the morning Calcium is to be greater than 7 for discharge  *Chronic hypertension Labile but improved Continue Norvasc,   *Chronic Type 2 diabetes mellitus without complication Stable Continue current regiment  *Chronic kidney disease stage IV Continue to avoid nephrotoxic agents Appreciate nephrology input   *Chronic Hyperlipidemia  Stable Continue statin rx   All the records are reviewed and case discussed with Care Management/Social Workerr. Management plans discussed with the patient, family and they are in agreement.  CODE STATUS: full  TOTAL TIME TAKING CARE OF THIS PATIENT: 35 minutes.     POSSIBLE D/C IN 1-2 DAYS, DEPENDING ON CLINICAL  CONDITION.   Dustin Flock M.D on 01/19/2019   Between 7am to 6pm - Pager -  [phone]  After 6pm go to [email or web address] - password [email or web address] [facility]  Office  [phone]  CC: Primary care physician; Center, [facility]  Note: This dictation was prepared with Dragon dictation along with smaller phrase technology. Any transcriptional errors that result from this process are unintentional.

## 2019-01-19 NOTE — Progress Notes (Signed)
[place], [place] 01/19/19  Subjective:   Patient known to our practice from outpatient follow-up.  He follows for chronic kidney disease stage IV Patient had left superior parathyroidectomy and umbilical hernia repair at [facility] for parathyroid adenoma He was brought to the emergency room by EMS by his group home after they were notified of abnormal lab values from [facility]. Patient was noted to have severe hypocalcemia.  He was started on calcium supplementation and cinacalcet was discontinued.  He feels well today.  No acute complaints.  States he is able to eat good.  Albumin level is normal.  Calcium this morning is up to 6.5  Objective:  Vital signs in last 24 hours:  Temp:  [98.3 F (36.8 C)-98.4 F (36.9 C)] 98.4 F (36.9 C) (02/22 1157) Pulse Rate:  [54-64] 60 (02/22 1157) Resp:  [16-22] 18 (02/22 1157) BP: (126-163)/(64-96) 126/64 (02/22 1157) SpO2:  [97 %-100 %] 100 % (02/22 1157)  Weight change:  Filed Weights   01/17/19 1602  Weight: 76.2 kg    Intake/Output:    Intake/Output Summary (Last 24 hours) at 01/19/2019 1303 Last data filed at 01/19/2019 1000 Gross per 24 hour  Intake 1588.54 ml  Output -  Net 1588.54 ml     Physical Exam: General:  Elderly gentleman, laying in the bed  HEENT  anicteric, moist oral mucous membranes, decreased hearing  Neck  supple  Pulm/lungs  clear to auscultation bilaterally  CVS/Heart  no rub  Abdomen:   Soft, nontender  Extremities:  No peripheral edema  Neurologic:  Alert, able to answer questions  Skin:  No acute rashes    Basic Metabolic Panel:  Recent Labs  Lab 01/17/19 1620 01/18/19 0614 01/19/19 0509  NA 142 141 143  K 4.8 4.9 4.9  CL 112* 109 111  CO2 18* 19* 22  GLUCOSE 103* 103* 114*  BUN 48* 47* 47*  CREATININE 3.29* 3.21* 3.31*  CALCIUM 5.5* 5.8* 6.5*  MG 1.7  --   --      CBC: Recent Labs  Lab 01/17/19 1620 01/18/19 0614  WBC 4.0 4.7  HGB 11.1* 10.8*  HCT 35.2*  34.2*  MCV 94.1 93.2  PLT 271 284     No results found for: HEPBSAG, HEPBSAB, HEPBIGM    Microbiology:  No results found for this or any previous visit (from the past 240 hour(s)).  Coagulation Studies: No results for input(s): LABPROT, INR in the last 72 hours.  Urinalysis: No results for input(s): COLORURINE, LABSPEC, PHURINE, GLUCOSEU, HGBUR, BILIRUBINUR, KETONESUR, PROTEINUR, UROBILINOGEN, NITRITE, LEUKOCYTESUR in the last 72 hours.  Invalid input(s): APPERANCEUR    Imaging: No results found.   Medications:   . sodium chloride Stopped (01/18/19 2246)  . calcium gluconate Stopped (01/18/19 2216)   . amLODipine  5 mg Oral Daily  . atorvastatin  20 mg Oral Daily  . calcitRIOL  0.5 mcg Oral Daily  . calcium carbonate  2 tablet Oral TID WC  . finasteride  5 mg Oral Daily  . heparin  5,000 Units Subcutaneous Q8H  . insulin aspart  0-5 Units Subcutaneous QHS  . insulin aspart  0-9 Units Subcutaneous TID WC  . Melatonin  2.5 mg Oral QHS  . metoprolol succinate  25 mg Oral Daily  . OLANZapine  30 mg Oral QHS  . thiamine  100 mg Oral Daily   sodium chloride, acetaminophen **OR** acetaminophen, ondansetron **OR** ondansetron (ZOFRAN) IV  Assessment/ Plan:  73 y.o. African-American male with  diabetes, history of recurrent alcoholic pancreatitis, BPH, kidney stones, schizophrenia, hyperparathyroidism status post parathyroidectomy 2011, February 2020 at [facility], cystoscopy and bladder stone removal 2008, chronic kidney disease stage IV  Chronic kidney disease stage IV likely secondary to atherosclerosis from hypertension and diabetes Outpatient baseline creatinine from November 08, 2018 is 2.96/GFR 24 Presenting creatinine is 3.2/GFR 21 Continue to monitor. May be able to restart lisinopril 5 mg daily (outpatient dose) or ARB losartan once serum creatinine is deemed to be stable  Primary hyperparathyroidism status post parathyroidectomy on 12/31/2018 at [facility] now resulting in  hypocalcemia Remain off of cinacalcet Initially, patient required IV calcium supplementation Calcium is now improving with oral calcium supplementation and activated vitamin D Follow closely  Patient has outpatient follow-up appointment with nephrology on February 13, 2019 (Dr. Candiss Norse) He will need to continue to follow-up with [facility] clinic endocrinology also   LOS: 1 Jaret Coppedge [facility] 2/22/20201:03 PM  [place], [place]  Note: This note was prepared with Dragon dictation. Any transcription errors are unintentional

## 2019-01-20 LAB — BASIC METABOLIC PANEL
Anion gap: 8 (ref 5–15)
BUN: 48 mg/dL — AB (ref 8–23)
CO2: 24 mmol/L (ref 22–32)
Calcium: 7.4 mg/dL — ABNORMAL LOW (ref 8.9–10.3)
Chloride: 109 mmol/L (ref 98–111)
Creatinine, Ser: 3.28 mg/dL — ABNORMAL HIGH (ref 0.61–1.24)
GFR calc Af Amer: 21 mL/min — ABNORMAL LOW (ref >60)
GFR calc non Af Amer: 18 mL/min — ABNORMAL LOW (ref >60)
Glucose, Bld: 118 mg/dL — ABNORMAL HIGH (ref 70–99)
Potassium: 5.2 mmol/L — ABNORMAL HIGH (ref 3.5–5.1)
Sodium: 141 mmol/L (ref 135–145)

## 2019-01-20 LAB — GLUCOSE, CAPILLARY
Glucose-Capillary: 107 mg/dL — ABNORMAL HIGH (ref 70–99)
Glucose-Capillary: 177 mg/dL — ABNORMAL HIGH (ref 70–99)

## 2019-01-20 LAB — CALCIUM, IONIZED: Calcium, Ionized, Serum: 3.1 mg/dL — ABNORMAL LOW (ref 4.5–5.6)

## 2019-01-20 MED ORDER — CALCITRIOL 0.5 MCG PO CAPS: 0.5000 ug | ORAL_CAPSULE | Freq: Every day | ORAL | 2 refills | Status: DC

## 2019-01-20 MED ORDER — CALCIUM CARBONATE ANTACID 500 MG PO CHEW: 2.0000 | CHEWABLE_TABLET | Freq: Three times a day (TID) | ORAL | 2 refills | Status: DC

## 2019-01-20 MED ORDER — OXYCODONE-ACETAMINOPHEN 5-325 MG PO TABS: 1.0000 | ORAL_TABLET | ORAL | 0 refills | Status: DC | PRN

## 2019-01-20 NOTE — NC FL2 (Addendum)
[place] LEVEL OF CARE SCREENING TOOL     IDENTIFICATION  Patient Name: [name] Birthdate: 1946/11/23 Sex: male Admission Date (Current Location): 01/17/2019  [place] and [place] Number:  Selena Lesser [id number] [place] and Address:  [facility], [address], [place], [place] [address]      Provider Number: [id number]  Attending Physician Name and Address:  Dustin Flock, MD  Relative Name and Phone Number:  Abrian [facility]) [phone] or [facility]PS (Legal Guardian)     Current Level of Care: Hospital Recommended Level of Care: Family Care Home([facility]) Prior Approval Number:    Date Approved/Denied:   PASRR Number:    Discharge Plan: Domiciliary (Rest home)([facility])    Current Diagnoses: Patient Active Problem List   Diagnosis Date Noted  . Hypocalcemia 01/17/2019  . Hyperglycemia 04/17/2017  . Folic acid deficiency 10/62/6948  . Chronic constipation 03/02/2017  . Thiamine deficiency 03/02/2017  . Chronic seasonal allergic rhinitis due to pollen 03/02/2017  . Type 2 diabetes mellitus without complication, without long-term current use of insulin ( [place]) 03/02/2017  . Lives in long-term care facility 03/02/2017  . Hyperlipidemia 03/02/2017  . Vitamin D deficiency 03/02/2017  . Anemia 03/02/2017  . Encephalopathy, metabolic 54/62/7035  . CKD (chronic kidney disease) stage 4, GFR 15-29 ml/min (HCC) 01/05/2012  . Tobacco abuse 01/05/2012  . Metabolic acidosis 00/93/8182  . Pancreatitis, hx of  11/09/2011  . Schizophrenia ([place]) 11/09/2011  . History of nephrolithiasis 11/09/2011  . 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 Hypertension 11/09/2011  . Alcohol abuse 11/09/2011  . Altered mental status 11/09/2011  . UTI (lower urinary tract infection) 11/09/2011  . Acute on chronic renal insufficiency 11/09/2011    Orientation RESPIRATION BLADDER Height & Weight      Self, Place, Situation, Time  Normal Continent Weight: 168 lb (76.2 kg) Height:  5\' 11"  (180.3 cm)  BEHAVIORAL SYMPTOMS/MOOD NEUROLOGICAL BOWEL NUTRITION STATUS      Continent Diet(Heart healthy/Carb modified)  AMBULATORY STATUS COMMUNICATION OF NEEDS Skin   Supervision Verbally Normal                       Personal Care Assistance Level of Assistance  Bathing, Feeding, Dressing Bathing Assistance: Limited assistance Feeding assistance: Independent Dressing Assistance: Limited assistance     Functional Limitations Info  Sight, Hearing, Speech Sight Info: Adequate Hearing Info: Adequate Speech Info: Adequate    SPECIAL CARE FACTORS FREQUENCY                       Contractures Contractures Info: Not present    Additional Factors Info  Code Status, Allergies, Psychotropic Code Status Info: Full Allergies Info: No Known Allergies Psychotropic Info: Zyprexa         Current Medications (01/20/2019):  This is the current hospital active medication list Current Facility-Administered Medications  Medication Dose Route Frequency Provider Last Rate Last Dose  . 0.9 %  sodium chloride infusion   Intravenous PRN Salary, Holly Bodily D, MD   Stopped at 01/19/19 1821  . acetaminophen (TYLENOL) tablet 650 mg  650 mg Oral Q6H PRN Lance Coon, MD       Or  . acetaminophen (TYLENOL) suppository 650 mg  650 mg Rectal Q6H PRN Lance Coon, MD      . amLODipine (NORVASC) tablet 5 mg  5 mg Oral Daily Salary, Montell D, MD   5 mg at 01/19/19 1013  .  atorvastatin (LIPITOR) tablet 20 mg  20 mg Oral Daily Lance Coon, MD   20 mg at 01/19/19 1013  . calcitRIOL (ROCALTROL) capsule 0.5 mcg  0.5 mcg Oral Daily Murlean Iba, MD   0.5 mcg at 01/19/19 1010  . calcium carbonate (TUMS - dosed in mg elemental calcium) chewable tablet 400 mg of elemental calcium  2 tablet Oral TID WC Singh, Harmeet, MD   400 mg of elemental calcium at 01/19/19 1720  . finasteride (PROSCAR) tablet 5 mg  5 mg  Oral Daily Lance Coon, MD   5 mg at 01/19/19 1013  . heparin injection 5,000 Units  5,000 Units Subcutaneous Camelia Phenes Lance Coon, MD   5,000 Units at 01/20/19 [phone]  . insulin aspart (novoLOG) injection 0-5 Units  0-5 Units Subcutaneous QHS Lance Coon, MD      . insulin aspart (novoLOG) injection 0-9 Units  0-9 Units Subcutaneous TID WC Lance Coon, MD   2 Units at 01/19/19 1327  . Melatonin TABS 2.5 mg  2.5 mg Oral Corwin Levins, MD   2.5 mg at 01/19/19 2230  . metoprolol succinate (TOPROL-XL) 24 hr tablet 25 mg  25 mg Oral Daily Lance Coon, MD   25 mg at 01/19/19 1013  . OLANZapine (ZYPREXA) tablet 30 mg  30 mg Oral Corwin Levins, MD   30 mg at 01/19/19 2230  . ondansetron (ZOFRAN) tablet 4 mg  4 mg Oral Q6H PRN Lance Coon, MD       Or  . ondansetron [facility]) injection 4 mg  4 mg Intravenous Q6H PRN Lance Coon, MD      . thiamine (VITAMIN B-1) tablet 100 mg  100 mg Oral Daily Lance Coon, MD   100 mg at 01/19/19 1013     Discharge Medications: Medication List    STOP taking these medications   cinacalcet 30 MG tablet Commonly known as:  SENSIPAR   glimepiride 4 MG tablet Commonly known as:  AMARYL   lisinopril 5 MG tablet Commonly known as:  PRINIVIL,ZESTRIL     TAKE these medications   amLODipine 2.5 MG tablet Commonly known as:  NORVASC Take 2.5 mg by mouth Nightly.   atorvastatin 20 MG tablet Commonly known as:  LIPITOR Take 1 tablet (20 mg total) by mouth daily.   calcitRIOL 0.5 MCG capsule Commonly known as:  ROCALTROL Take 1 capsule (0.5 mcg total) by mouth daily.   calcium carbonate 500 MG chewable tablet Commonly known as:  TUMS - dosed in mg elemental calcium Chew 2 tablets (400 mg of elemental calcium total) by mouth 3 (three) times daily with meals.   denosumab 60 MG/ML Sosy injection Commonly known as:  PROLIA Inject 1 mL into the skin every 6 (six) months.   docusate sodium 100 MG capsule Commonly known as:   COLACE Take 1 capsule (100 mg total) by mouth 2 (two) times daily.   ergocalciferol 1.25 MG (50000 UT) capsule Commonly known as:  VITAMIN D2 Take 1 capsule (50,000 Units total) by mouth every 6 (six) weeks.   finasteride 5 MG tablet Commonly known as:  PROSCAR Take 1 tablet (5 mg total) by mouth daily.   fluticasone 50 MCG/ACT nasal spray Commonly known as:  FLONASE USE 2 SPRAYS IN EACH NOSTRIL DAILY AS NEEDED   folic acid 1 MG tablet Commonly known as:  FOLVITE Take 1 tablet (1 mg total) by mouth daily.   glucose blood test strip 1 each by Other route 2 (two) times daily. Use  as instructed   labetalol 100 MG tablet Commonly known as:  NORMODYNE Take 1 tablet (100 mg total) by mouth 2 (two) times daily.   LANTUS SOLOSTAR 100 UNIT/ML Solostar Pen Generic drug:  Insulin Glargine Inject 10 Units into the skin Nightly.   MELATONIN TR 1 MG Tbcr Generic drug:  Melatonin ER TAKE 1 TABLET BY MOUTH EVERY NIGHT AT BEDTIME   metoprolol succinate 25 MG 24 hr tablet Commonly known as:  TOPROL-XL TAKE 1 TABLET BY MOUTH DAILY   multivitamin with minerals tablet TAKE 1 TABLET BY MOUTH DAILY   OLANZapine 15 MG tablet Commonly known as:  ZYPREXA Take 2 tablets by mouth at bedtime.   oxyCODONE-acetaminophen 5-325 MG tablet Commonly known as:  PERCOCET Take 1 tablet by mouth every 4 (four) hours as needed for severe pain.   pioglitazone 30 MG tablet Commonly known as:  ACTOS Take 30 mg by mouth daily.   polyethylene glycol powder powder Commonly known as:  GLYCOLAX/MIRALAX MIX 17 GRAMS (1 CAPFUL) IN 8 OUNCES OF WATER & DRINK DAILY   thiamine 100 MG tablet Commonly known as:  VITAMIN B-1 Take 1 tablet (100 mg total) by mouth daily.   TRUEPLUS LANCETS 26G Misc 1 each by Does not apply route 2 (two) times daily.     Relevant Imaging Results:  Relevant Lab Results:   Additional Information SS# [id number]  Zettie Pho, LCSW

## 2019-01-20 NOTE — Discharge Summary (Signed)
[facility] [place] [facility], 73 y.o., DOB [birth year], MRN [id number]. Admission date: 01/17/2019 Discharge Date 01/20/2019 Primary MD Center, [place] Admitting Physician Lance Coon, MD  Admission Diagnosis  Hypocalcemia [E83.51]  Discharge Diagnosis   Principal Problem: Acute acquired hypercalcemia   Hypertension Acute on chronic CKD (chronic kidney disease) stage 4, GFR 15-29 ml/min (HCC)  Type 2 diabetes mellitus without complication, without long-term current use of insulin [facility])   Hyperlipidemia         Hospital Course  Bergen Magner  is a 73 y.o. male who presents with chief complaint as above.  Patient presents the ED today after routine office visit in the outpatient setting found his calcium to be low.  Patient had parathyroidectomy performed about 3 weeks ago due to hyperparathyroidism.  Patient was very symptomatic therefore admitted to the hospital.  Calcium supplements were started he was seen in consultation by nephrology.  He was also started on calcitriol.  His calcium is much improved.  He will need follow-up with his primary care provider to recheck his calcium as well as follow-up with nephrology.            Consults  None  Significant Tests:  See full reports for all details    No results found.     Today   Subjective:   Watt Climes patient denies any complaints Objective:   Blood pressure (!) 144/77, pulse (!) 59, temperature 98.4 F (36.9 C), temperature source Oral, resp. rate 16, height 5\' 11"  (1.803 m), weight 76.2 kg, SpO2 96 %.  .  Intake/Output Summary (Last 24 hours) at 01/20/2019 1044 Last data filed at 01/19/2019 2305 Gross per 24 hour  Intake 780.52 ml  Output 1 ml  Net 779.52 ml    Exam VITAL SIGNS: Blood pressure (!) 144/77, pulse (!) 59, temperature 98.4 F (36.9 C), temperature source Oral, resp. rate 16, height 5\' 11"  (1.803 m), weight 76.2 kg, SpO2 96  %.  GENERAL:  73 y.o.-year-old patient lying in the bed with no acute distress.  EYES: Pupils equal, round, reactive to light and accommodation. No scleral icterus. Extraocular muscles intact.  HEENT: Head atraumatic, normocephalic. Oropharynx and nasopharynx clear.  NECK:  Supple, no jugular venous distention. No thyroid enlargement, no tenderness.  LUNGS: Normal breath sounds bilaterally, no wheezing, rales,rhonchi or crepitation. No use of accessory muscles of respiration.  CARDIOVASCULAR: S1, S2 normal. No murmurs, rubs, or gallops.  ABDOMEN: Soft, nontender, nondistended. Bowel sounds present. No organomegaly or mass.  EXTREMITIES: No pedal edema, cyanosis, or clubbing.  NEUROLOGIC: Cranial nerves II through XII are intact. Muscle strength 5/5 in all extremities. Sensation intact. Gait not checked.  PSYCHIATRIC: The patient is alert and oriented x 3.  SKIN: No obvious rash, lesion, or ulcer.   Data Review     CBC w Diff:  Lab Results  Component Value Date   WBC 4.7 01/18/2019   HGB 10.8 (L) 01/18/2019   HGB 13.9 03/08/2017   HCT 34.2 (L) 01/18/2019   HCT 42.6 03/08/2017   PLT 284 01/18/2019   PLT 255 03/08/2017   LYMPHOPCT 20 04/17/2017   MONOPCT 11 04/17/2017   EOSPCT 1 04/17/2017   BASOPCT 0 04/17/2017   CMP:  Lab Results  Component Value Date   NA 141 01/20/2019   NA 135 03/08/2017   K 5.2 (H) 01/20/2019   CL 109 01/20/2019   CO2 24 01/20/2019   BUN 48 (H) 01/20/2019  BUN 45 (H) 03/08/2017   CREATININE 3.28 (H) 01/20/2019   PROT 8.3 (H) 04/17/2017   ALBUMIN 4.1 01/18/2019   ALBUMIN 4.1 03/08/2017   BILITOT 0.9 04/17/2017   ALKPHOS 94 04/17/2017   AST 20 04/17/2017   ALT 44 04/17/2017  .  Micro Results No results found for this or any previous visit (from the past 240 hour(s)).      Code Status Orders  (From admission, onward)         Start     Ordered   01/18/19 0134  Full code  Continuous     01/18/19 0133        Code Status History     Date Active Date Inactive Code Status Order ID Comments User Context   04/17/2017 1928 04/19/2017 1959 Full Code [id number]  Dustin Flock, MD Inpatient   09/10/2013 1420 09/12/2013 1609 Full Code [id number]  Hoy Morn, MD ED   03/09/2012 1256 03/10/2012 1752 Full Code [id number]  Midge Aver, RN Inpatient   01/05/2012 0054 01/07/2012 2117 Full Code [id number]  Tawanna Solo, RN Inpatient   11/09/2011 1030 11/14/2011 1735 Full Code [id number]  Cheryln Manly, RN ED          Contact information for after-discharge care    Destination    [facility] .   Service:  Group Home Contact information: [address] [place] [phone]              Discharge Medications   Allergies as of 01/20/2019   No Known Allergies     Medication List    STOP taking these medications   cinacalcet 30 MG tablet Commonly known as:  SENSIPAR   glimepiride 4 MG tablet Commonly known as:  AMARYL   lisinopril 5 MG tablet Commonly known as:  PRINIVIL,ZESTRIL     TAKE these medications   amLODipine 2.5 MG tablet Commonly known as:  NORVASC Take 2.5 mg by mouth Nightly.   atorvastatin 20 MG tablet Commonly known as:  LIPITOR Take 1 tablet (20 mg total) by mouth daily.   calcitRIOL 0.5 MCG capsule Commonly known as:  ROCALTROL Take 1 capsule (0.5 mcg total) by mouth daily.   calcium carbonate 500 MG chewable tablet Commonly known as:  TUMS - dosed in mg elemental calcium Chew 2 tablets (400 mg of elemental calcium total) by mouth 3 (three) times daily with meals.   denosumab 60 MG/ML Sosy injection Commonly known as:  PROLIA Inject 1 mL into the skin every 6 (six) months.   docusate sodium 100 MG capsule Commonly known as:  COLACE Take 1 capsule (100 mg total) by mouth 2 (two) times daily.   ergocalciferol 1.25 MG (50000 UT) capsule Commonly known as:  VITAMIN D2 Take 1 capsule (50,000 Units total) by mouth every 6 (six) weeks.    finasteride 5 MG tablet Commonly known as:  PROSCAR Take 1 tablet (5 mg total) by mouth daily.   fluticasone 50 MCG/ACT nasal spray Commonly known as:  FLONASE USE 2 SPRAYS IN EACH NOSTRIL DAILY AS NEEDED   folic acid 1 MG tablet Commonly known as:  FOLVITE Take 1 tablet (1 mg total) by mouth daily.   glucose blood test strip 1 each by Other route 2 (two) times daily. Use as instructed   labetalol 100 MG tablet Commonly known as:  NORMODYNE Take 1 tablet (100 mg total) by mouth 2 (two) times daily.   LANTUS SOLOSTAR  100 UNIT/ML Solostar Pen Generic drug:  Insulin Glargine Inject 10 Units into the skin Nightly.   MELATONIN TR 1 MG Tbcr Generic drug:  Melatonin ER TAKE 1 TABLET BY MOUTH EVERY NIGHT AT BEDTIME   metoprolol succinate 25 MG 24 hr tablet Commonly known as:  TOPROL-XL TAKE 1 TABLET BY MOUTH DAILY   multivitamin with minerals tablet TAKE 1 TABLET BY MOUTH DAILY   OLANZapine 15 MG tablet Commonly known as:  ZYPREXA Take 2 tablets by mouth at bedtime.   oxyCODONE-acetaminophen 5-325 MG tablet Commonly known as:  PERCOCET Take 1 tablet by mouth every 4 (four) hours as needed for severe pain.   pioglitazone 30 MG tablet Commonly known as:  ACTOS Take 30 mg by mouth daily.   polyethylene glycol powder powder Commonly known as:  GLYCOLAX/MIRALAX MIX 17 GRAMS (1 CAPFUL) IN 8 OUNCES OF WATER & DRINK DAILY   thiamine 100 MG tablet Commonly known as:  VITAMIN B-1 Take 1 tablet (100 mg total) by mouth daily.   TRUEPLUS LANCETS 26G Misc 1 each by Does not apply route 2 (two) times daily.          Total Time in preparing paper work, data evaluation and todays exam - 78 minutes  Dustin Flock M.D on 01/20/2019 at 10:44 AM [place]  [phone]

## 2019-01-20 NOTE — Progress Notes (Signed)
[place], [place] 01/20/19  Subjective:   Patient known to our practice from outpatient follow-up.  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 Patient was noted to have severe hypocalcemia.  He was started on calcium supplementation and cinacalcet was discontinued.  He feels well today.  No acute complaints.  States he is able to eat good.  Albumin level is normal.  Calcium this morning is up to 7.4 this morning  Objective:  Vital signs in last 24 hours:  Temp:  [98.3 F (36.8 C)-98.4 F (36.9 C)] 98.4 F (36.9 C) (02/23 0419) Pulse Rate:  [59-66] 59 (02/23 0419) Resp:  [16-18] 16 (02/23 0419) BP: (126-162)/(64-88) 144/77 (02/23 0419) SpO2:  [96 %-100 %] 96 % (02/23 0419)  Weight change:  Filed Weights   01/17/19 1602  Weight: 76.2 kg    Intake/Output:    Intake/Output Summary (Last 24 hours) at 01/20/2019 1135 Last data filed at 01/19/2019 2305 Gross per 24 hour  Intake 780.52 ml  Output 1 ml  Net 779.52 ml     Physical Exam: General:  Elderly gentleman, laying in the bed  HEENT  anicteric, moist oral mucous membranes, decreased hearing  Neck  supple  Pulm/lungs  clear to auscultation bilaterally  CVS/Heart  no rub  Abdomen:   Soft, nontender  Extremities:  No peripheral edema  Neurologic:  Alert, able to answer questions  Skin:  No acute rashes    Basic Metabolic Panel:  Recent Labs  Lab 01/17/19 1620 01/18/19 0614 01/19/19 0509 01/20/19 0924  NA 142 141 143 141  K 4.8 4.9 4.9 5.2*  CL 112* 109 111 109  CO2 18* 19* 22 24  GLUCOSE 103* 103* 114* 118*  BUN 48* 47* 47* 48*  CREATININE 3.29* 3.21* 3.31* 3.28*  CALCIUM 5.5* 5.8* 6.5* 7.4*  MG 1.7  --   --   --      CBC: Recent Labs  Lab  01/17/19 1620 01/18/19 0614  WBC 4.0 4.7  HGB 11.1* 10.8*  HCT 35.2* 34.2*  MCV 94.1 93.2  PLT 271 284     No results found for: HEPBSAG, HEPBSAB, HEPBIGM    Microbiology:  No results found for this or any previous visit (from the past 240 hour(s)).  Coagulation Studies: No results for input(s): LABPROT, INR in the last 72 hours.  Urinalysis: No results for input(s): COLORURINE, LABSPEC, PHURINE, GLUCOSEU, HGBUR, BILIRUBINUR, KETONESUR, PROTEINUR, UROBILINOGEN, NITRITE, LEUKOCYTESUR in the last 72 hours.  Invalid input(s): APPERANCEUR    Imaging: No results found.   Medications:   . sodium chloride Stopped (01/19/19 1821)   . amLODipine  5 mg Oral Daily  . atorvastatin  20 mg Oral Daily  . calcitRIOL  0.5 mcg Oral Daily  . calcium carbonate  2 tablet Oral TID WC  . finasteride  5 mg Oral Daily  . heparin  5,000 Units Subcutaneous Q8H  . insulin aspart  0-5 Units Subcutaneous QHS  . insulin aspart  0-9 Units Subcutaneous TID WC  . Melatonin  2.5 mg Oral QHS  . metoprolol succinate  25 mg Oral Daily  . OLANZapine  30 mg Oral QHS  . thiamine  100 mg Oral Daily   sodium chloride, acetaminophen **OR** acetaminophen, ondansetron **OR** ondansetron (ZOFRAN) IV  Assessment/ Plan:  73 y.o. African-American male with diabetes, history of recurrent alcoholic pancreatitis, BPH, kidney stones, schizophrenia, hyperparathyroidism status post parathyroidectomy 2011, February 2020 at [facility], cystoscopy and bladder stone removal 2008, chronic kidney disease stage IV  Chronic kidney disease stage IV likely secondary to atherosclerosis from hypertension and diabetes Outpatient baseline creatinine from November 08, 2018 is 2.96/GFR 24 Presenting creatinine is 3.3/GFR 21 Continue to monitor. May be able to restart lisinopril 5 mg daily (outpatient dose) or ARB losartan  As outpatient  Primary hyperparathyroidism status post parathyroidectomy on 12/31/2018 at [facility] now resulting in  hypocalcemia Remain off of cinacalcet Initially, patient required IV calcium supplementation Calcium is now improving with oral calcium supplementation and activated vitamin D Follow closely  Patient has outpatient follow-up appointment with nephrology on February 13, 2019 (Dr. Candiss Norse) He will need to continue to follow-up with [facility] clinic endocrinology also   LOS: 2 Kambrie Eddleman Candiss Norse 2/23/202011:35 [place], [place]  Note: This note was prepared with Dragon dictation. Any transcription errors are unintentional

## 2019-01-20 NOTE — Clinical Social Work Note (Signed)
Clinical Social Work Assessment  Patient Details  Name: [name] MRN: [id number] Date of Birth: 13-Jan-1946  Date of referral:  01/20/19               Reason for consult:  Facility Placement                Permission sought to share information with:  Chartered certified accountant granted to share information::  Yes, Verbal Permission Granted  Name::        Agency::  [facility]  Relationship::     Contact Information:     Housing/Transportation Living arrangements for the past 2 months:  Group Home Source of Information:  Patient, Medical Team, Facility Patient Interpreter Needed:  None Criminal Activity/Legal Involvement Pertinent to Current Situation/Hospitalization:  No - Comment as needed Significant Relationships:  [name], [name] Lives with:  Facility Resident Do you feel safe going back to the place where you live?  Yes Need for family participation in patient care:  Yes (Comment)(Patient has a legal guardian through [place])  Care giving concerns:  Patient admitted from a [place]   Social Worker assessment / plan:  The CSW, through chart review, became aware that the patient admitted from a group home and has a legal guardian through [name]. The CSW attempted to contact the on-call/after hours line for [name] and was on hold for over 5 minutes with no answer. The CSW contacted the patient's group home, [name], who confirmed that he has been a resident for more than 2 years and can return when stable via facility transport, including today. The facility representative requested a printed dc summary and FL 2 in the discharge packet as they have no fax capabilities at this time.  According to the patient's chart, he is in psychosocial rehabilitation (PSR) services at [name]. The CSW will facilitate discharge when the patient is medically stable and has updated  the medical team of the ability to do so today.  Employment status:  Retired Forensic scientist:  [employer] [name], Medicaid In [place] PT Recommendations:  Not assessed at this time Information / Referral to community resources:     Patient/Family's Response to care:  The patient's group home owner thanked the CSW for contact.  Patient/Family's Understanding of and Emotional Response to Diagnosis, Current Treatment, and Prognosis: The group home understands that the patient may discharge today and are willing and able for him to return when the patient is medically stable.  Emotional Assessment Appearance:  Appears stated age Attitude/Demeanor/Rapport:  Engaged Affect (typically observed):  Stable Orientation:  Oriented to Self, Oriented to Place, Oriented to  Time, Oriented to Situation Alcohol / Substance use:  Never Used Psych involvement (Current and /or in the community):  [name])  Discharge Needs  Concerns to be addressed:  Discharge Planning Concerns, Care Coordination Readmission within the last 30 days:  No Current discharge risk:  Psychiatric Illness Barriers to Discharge:  No Barriers Identified   Seth Pho, LCSW 01/20/2019, 10:20 AM

## 2019-01-20 NOTE — Clinical Social Work Note (Signed)
The patient will discharge today to [facility]. The CSW will deliver the discharge packet once the [facility] has been signed. The facility will provide transport, and the contact number for such will be on the discharge packet. Once the packet is delivered, the CSW will sign off. Please consult should needs arise.  Santiago Bumpers, MSW, Latanya Presser [phone]

## 2019-01-20 NOTE — Progress Notes (Signed)
[name] to be D/C'd [place]  per MD order.  Discussed prescriptions and follow up appointments with the patient. Prescriptions given to patient, medication list explained in detail. Pt verbalized understanding.  Allergies as of 01/20/2019   No Known Allergies     Medication List    STOP taking these medications   cinacalcet 30 MG tablet Commonly known as:  SENSIPAR   glimepiride 4 MG tablet Commonly known as:  AMARYL   lisinopril 5 MG tablet Commonly known as:  PRINIVIL,ZESTRIL     TAKE these medications   amLODipine 2.5 MG tablet Commonly known as:  NORVASC Take 2.5 mg by mouth Nightly.   atorvastatin 20 MG tablet Commonly known as:  LIPITOR Take 1 tablet (20 mg total) by mouth daily.   calcitRIOL 0.5 MCG capsule Commonly known as:  ROCALTROL Take 1 capsule (0.5 mcg total) by mouth daily.   calcium carbonate 500 MG chewable tablet Commonly known as:  TUMS - dosed in mg elemental calcium Chew 2 tablets (400 mg of elemental calcium total) by mouth 3 (three) times daily with meals.   denosumab 60 MG/ML Sosy injection Commonly known as:  PROLIA Inject 1 mL into the skin every 6 (six) months.   docusate sodium 100 MG capsule Commonly known as:  COLACE Take 1 capsule (100 mg total) by mouth 2 (two) times daily.   ergocalciferol 1.25 MG (50000 UT) capsule Commonly known as:  VITAMIN D2 Take 1 capsule (50,000 Units total) by mouth every 6 (six) weeks.   finasteride 5 MG tablet Commonly known as:  PROSCAR Take 1 tablet (5 mg total) by mouth daily.   fluticasone 50 MCG/ACT nasal spray Commonly known as:  FLONASE USE 2 SPRAYS IN EACH NOSTRIL DAILY AS NEEDED   folic acid 1 MG tablet Commonly known as:  FOLVITE Take 1 tablet (1 mg total) by mouth daily.   glucose blood test strip 1 each by Other route 2 (two) times daily. Use as instructed   labetalol 100 MG tablet Commonly known as:  NORMODYNE Take 1 tablet (100 mg total) by mouth 2 (two)  times daily.   LANTUS SOLOSTAR 100 UNIT/ML Solostar Pen Generic drug:  Insulin Glargine Inject 10 Units into the skin Nightly.   MELATONIN TR 1 MG Tbcr Generic drug:  Melatonin ER TAKE 1 TABLET BY MOUTH EVERY NIGHT AT BEDTIME   metoprolol succinate 25 MG 24 hr tablet Commonly known as:  TOPROL-XL TAKE 1 TABLET BY MOUTH DAILY   multivitamin with minerals tablet TAKE 1 TABLET BY MOUTH DAILY   OLANZapine 15 MG tablet Commonly known as:  ZYPREXA Take 2 tablets by mouth at bedtime.   oxyCODONE-acetaminophen 5-325 MG tablet Commonly known as:  PERCOCET Take 1 tablet by mouth every 4 (four) hours as needed for severe pain.   pioglitazone 30 MG tablet Commonly known as:  ACTOS Take 30 mg by mouth daily.   polyethylene glycol powder powder Commonly known as:  GLYCOLAX/MIRALAX MIX 17 GRAMS (1 CAPFUL) IN 8 OUNCES OF WATER & DRINK DAILY   thiamine 100 MG tablet Commonly known as:  VITAMIN B-1 Take 1 tablet (100 mg total) by mouth daily.   TRUEPLUS LANCETS 26G Misc 1 each by Does not apply route 2 (two) times daily.       Vitals:   01/20/19 0419 01/20/19 1100  BP: (!) 144/77 139/75  Pulse: (!) 59 66  Resp: 16 18  Temp: 98.4 F (36.9 C) 98.2 F (36.8 C)  SpO2: 96% 97%    Skin clean, dry and intact without evidence of skin break down, no evidence of skin tears noted. IV catheter discontinued intact. Site without signs and symptoms of complications. Dressing and pressure applied. Pt denies pain at this time. No complaints noted.  An After Visit Summary was printed and given to the patient. Patient escorted via [place], and D/C home via private auto.  Fuller Mandril, RN

## 2019-01-21 LAB — CALCITRIOL (1,25 DI-OH VIT D): Vit D, 1,25-Dihydroxy: 45.1 pg/mL (ref 19.9–79.3)

## 2019-03-13 ENCOUNTER — Other Ambulatory Visit: Payer: Self-pay | Admitting: Urology

## 2019-07-15 ENCOUNTER — Ambulatory Visit: Payer: Medicare Other | Admitting: Urology

## 2019-07-15 NOTE — Progress Notes (Signed)
07/16/2019 12:39 PM   [name] 07/17/1946 [id number]  Referring provider: Center, [facility] [place],   [id number]  Chief Complaint  Patient presents with  . Benign Prostatic Hypertrophy    HPI: Patient is a 73 year old male with a history of nephrolithiasis and BPH with LU TS who presents today for a follow up.     History of nephrolithiasis He had been seen by urology in 2008 for an obstructing left proximal ureteral calculus and bladder calculi. He underwent PCNL and cystolithopaxy in 2008.   Noncontrast CT performed 2011 noted bilateral nephrolithiasis.  He has not had any symptoms of renal colic or gross hematuria.  KUB taken in August 2019 noted no nephrolithiasis.  KUB 07/16/2019 revealed nonobstructive bowel gas pattern.  No definite nephroliths.  Moderate amount of stool throughout the colon, improved from prior study.  BPH WITH LUTS His PVR today is 257 mL.  He has no urinary complaints at this time.  He currently taking finasteride 5 mg daily.  He also denies any recent fevers, chills, nausea or vomiting.  He does not have a family history of PCa.    PMH: Past Medical History:  Diagnosis Date  . Alcoholism ([place])   . Allergy   . Altered mental status 2011   hx of 2/2 hypercalcemia, EtOH w/d and medication overuse  . Angina    mild pain on arm, left chest, moves down left chest  . Chronic renal insufficiency    with history of acute on chronic secondary to dehydration and hypercalcemia  . Dementia ([place])   . DM (diabetes mellitus) ([place])   . Encephalopathy   . Fx humeral neck 2008   right side, no history of surgical repair  . History of nephrolithiasis 2008   s/p ureterolithotomy   . Hypercalcemia    2/2 primary hyperparathyroidism  . Hypertension   . Pancreatitis 2011   history of several admissions for pancreatitis   . Parathyroid adenoma   . Primary hyperparathyroidism ([place])    s/p parathyroid adenoma resection  Oct. 8, 2011, repeat scan showed residual right adenoma, however pt did not f/u with surgery as outpt  . Schizophrenia ([place])    previously on zyprexa  . Tobacco abuse     Surgical History: Past Surgical History:  Procedure Laterality Date  . Ankle Pinning    . cysto, removal of bladder stone, attempted stent placement  08/15/2007  . cystoscopy, laser cystolithoplaxy  08/16/2007  . KIDNEY STONE SURGERY  07/2007 X2   /E-chart  . open left proximal ureterolithotomy  09/18/2007  . parathyroid adenoma resection  Oct. 8, 2011   residual adenoma Oct. 12, 2011  . PARATHYROIDECTOMY  03/09/2012   Procedure: PARATHYROIDECTOMY;  Surgeon: Pedro Earls, MD;  Location: [facility];  Service: General;  Laterality: N/A;  mediastinal paraqthyroidectomy   . THYROIDECTOMY  03/09/12    Home Medications:  Allergies as of 07/16/2019   No Known Allergies     Medication List       Accurate as of July 16, 2019 11:59 PM. If you have any questions, ask your nurse or doctor.        amLODipine 2.5 MG tablet Commonly known as: NORVASC Take 2.5 mg by mouth Nightly.   atorvastatin 20 MG tablet Commonly known as: LIPITOR Take 1 tablet (20 mg total) by mouth daily.   calcitRIOL 0.5 MCG capsule Commonly known as: ROCALTROL Take 1 capsule (0.5 mcg total) by mouth daily.  calcium carbonate 500 MG chewable tablet Commonly known as: TUMS - dosed in mg elemental calcium Chew 2 tablets (400 mg of elemental calcium total) by mouth 3 (three) times daily with meals.   denosumab 60 MG/ML Sosy injection Commonly known as: PROLIA Inject 1 mL into the skin every 6 (six) months.   docusate sodium 100 MG capsule Commonly known as: COLACE Take 1 capsule (100 mg total) by mouth 2 (two) times daily.   ergocalciferol 1.25 MG (50000 UT) capsule Commonly known as: VITAMIN D2 Take 1 capsule (50,000 Units total) by mouth every 6 (six) weeks.   finasteride 5 MG tablet Commonly known as: PROSCAR TAKE ONE TABLET BY  MOUTH EVERY DAY FOR ENLARGED PROSTATE   fluticasone 50 MCG/ACT nasal spray Commonly known as: FLONASE USE 2 SPRAYS IN EACH NOSTRIL DAILY AS NEEDED   folic acid 1 MG tablet Commonly known as: FOLVITE Take 1 tablet (1 mg total) by mouth daily.   glucose blood test strip 1 each by Other route 2 (two) times daily. Use as instructed   labetalol 100 MG tablet Commonly known as: NORMODYNE Take 1 tablet (100 mg total) by mouth 2 (two) times daily.   Lantus SoloStar 100 UNIT/ML Solostar Pen Generic drug: Insulin Glargine Inject 10 Units into the skin Nightly.   Melatonin TR 1 MG Tbcr Generic drug: Melatonin ER TAKE 1 TABLET BY MOUTH EVERY NIGHT AT BEDTIME   metoprolol succinate 25 MG 24 hr tablet Commonly known as: TOPROL-XL TAKE 1 TABLET BY MOUTH DAILY   multivitamin with minerals tablet TAKE 1 TABLET BY MOUTH DAILY   OLANZapine 15 MG tablet Commonly known as: ZYPREXA Take 2 tablets by mouth at bedtime.   oxyCODONE-acetaminophen 5-325 MG tablet Commonly known as: Percocet Take 1 tablet by mouth every 4 (four) hours as needed for severe pain.   pioglitazone 30 MG tablet Commonly known as: ACTOS Take 30 mg by mouth daily.   polyethylene glycol powder 17 GM/SCOOP powder Commonly known as: GLYCOLAX/MIRALAX MIX 17 GRAMS (1 CAPFUL) IN 8 OUNCES OF WATER & DRINK DAILY   thiamine 100 MG tablet Commonly known as: VITAMIN B-1 Take 1 tablet (100 mg total) by mouth daily.   TRUEplus Lancets 26G Misc 1 each by Does not apply route 2 (two) times daily.       Allergies: No Known Allergies  Family History: Family History  Problem Relation Age of Onset  . Kidney cancer Neg Hx   . Bladder Cancer Neg Hx   . Prostate cancer Neg Hx     Social History:  reports that he has been smoking cigarettes. He has a 50.00 pack-year smoking history. He has never used smokeless tobacco. He reports that he does not drink alcohol or use drugs.  ROS: UROLOGY Frequent Urination?: No Hard to  postpone urination?: No Burning/pain with urination?: No Get up at night to urinate?: No Leakage of urine?: No Urine stream starts and stops?: No Trouble starting stream?: No Do you have to strain to urinate?: No Blood in urine?: No Urinary tract infection?: No Sexually transmitted disease?: No Injury to kidneys or bladder?: No Painful intercourse?: No Weak stream?: No Erection problems?: No Penile pain?: No  Gastrointestinal Nausea?: No Vomiting?: No Indigestion/heartburn?: No Diarrhea?: No Constipation?: No  Constitutional Fever: No Night sweats?: No Weight loss?: No Fatigue?: No  Skin Skin rash/lesions?: No Itching?: No  Eyes Blurred vision?: No Double vision?: No  Ears/Nose/Throat Sore throat?: No Sinus problems?: No  Hematologic/Lymphatic Swollen glands?: No Easy bruising?:  No  Cardiovascular Leg swelling?: No Chest pain?: No  Respiratory Cough?: No Shortness of breath?: No  Endocrine Excessive thirst?: No  Musculoskeletal Back pain?: No Joint pain?: No  Neurological Headaches?: No Dizziness?: No  Psychologic Depression?: No Anxiety?: No  Physical Exam: BP (!) 162/90   Pulse (!) 58   Constitutional:  Well nourished. Alert and oriented, No acute distress. HEENT: [place] AT, moist mucus membranes.  Trachea midline, no masses. Cardiovascular: No clubbing, cyanosis, or edema. Respiratory: Normal respiratory effort, no increased work of breathing. GI: Abdomen is soft, non tender, non distended, no abdominal masses. Liver and spleen not palpable.  No hernias appreciated.  Stool sample for occult testing is not indicated.   GU: No CVA tenderness.  No bladder fullness or masses.  Patient with circumcised phallus.  Urethral meatus is patent.  No penile discharge. No penile lesions or rashes. Scrotum without lesions, cysts, rashes and/or edema.  Testicles are located scrotally bilaterally. No masses are appreciated in the testicles. Left and right  epididymis are normal. Rectal: Patient with  normal sphincter tone. Anus and perineum without scarring or rashes. No rectal masses are appreciated. Prostate is approximately 60 grams, could only palpate the apex and midportion of gland, no nodules are appreciated. Seminal vesicles could not be palpated Skin: No rashes, bruises or suspicious lesions. Lymph: No inguinal adenopathy. Neurologic: Grossly intact, no focal deficits, moving all 4 extremities. Psychiatric: Normal mood and affect.  Laboratory Data: Lab Results  Component Value Date   WBC 4.7 01/18/2019   HGB 10.8 (L) 01/18/2019   HCT 34.2 (L) 01/18/2019   MCV 93.2 01/18/2019   PLT 284 01/18/2019    Lab Results  Component Value Date   CREATININE 3.28 (H) 01/20/2019    Lab Results  Component Value Date   PSA 1.79 Test Methodology: Hybritech PSA 08/13/2007    Lab Results  Component Value Date   HGBA1C >15.5 (H) 04/17/2017        Component Value Date/Time   CHOL 133 03/08/2017 1102   HDL 41 03/08/2017 1102   CHOLHDL 3.2 03/08/2017 1102   CHOLHDL 4.8 08/15/2010 1634   VLDL 28 08/15/2010 1634   LDLCALC 62 03/08/2017 1102    Lab Results  Component Value Date   AST 20 04/17/2017   Lab Results  Component Value Date   ALT 44 04/17/2017   I have reviewed the labs  Pertinent Imaging: Results for KRAIG, GENIS (MRN [id number]) as of 07/22/2019 12:36  Ref. Range 07/16/2019 12:16  Scan Result Unknown [id number]  CLINICAL DATA:  Flank pain  EXAM: ABDOMEN - 1 VIEW  COMPARISON:  July 11, 2018  FINDINGS: Again noted is a moderate amount of stool throughout the colon. There are multiple calcifications that project over the patient's pelvis and are favored to represent phleboliths. There are no definite nephroliths. There are degenerative changes of the lumbar spine and hips.  IMPRESSION: 1. Nonobstructive bowel gas pattern. 2. No definite nephroliths. 3. Moderate amount of stool throughout the colon,  improved from prior study.   Electronically Signed   By: Constance Holster M.D.   On: 07/16/2019 14:46    Assessment & Plan:    1. BPH with LUTS Continue conservative management, avoiding bladder irritants and timed voiding's Continue finasteride 5 mg daily RTC in 12 months for I PSS, exam and PVR  2. History of nephrolithiasis Bilateral nephrolithiasis seen on CT in 2011 KUB 06/2019 no stones seen  3. Constipation Currently on MiraLax and Colace  Return in about 1 year (around 07/15/2020) for KUB and exam .  These notes generated with voice recognition software. I apologize for typographical errors.  Zara Council, PA-C  [facility] [address] [place], [place] [address] [phone]

## 2019-07-16 ENCOUNTER — Encounter: Payer: Self-pay | Admitting: Urology

## 2019-07-16 ENCOUNTER — Other Ambulatory Visit: Payer: Self-pay

## 2019-07-16 ENCOUNTER — Ambulatory Visit
Admission: RE | Admit: 2019-07-16 | Discharge: 2019-07-16 | Disposition: A | Discharge status: Home / Self Care | Payer: Medicare Other | Source: Ambulatory Visit | Attending: Urology | Admitting: Urology

## 2019-07-16 ENCOUNTER — Ambulatory Visit (INDEPENDENT_AMBULATORY_CARE_PROVIDER_SITE_OTHER): Payer: Medicare Other | Admitting: Urology

## 2019-07-16 VITALS — BP 162/90 | HR 58

## 2019-07-16 DIAGNOSIS — Z87442 Personal history of urinary calculi: Secondary | ICD-10-CM | POA: Diagnosis not present

## 2019-07-16 DIAGNOSIS — N138 Other obstructive and reflux uropathy: Secondary | ICD-10-CM

## 2019-07-16 DIAGNOSIS — N401 Enlarged prostate with lower urinary tract symptoms: Secondary | ICD-10-CM | POA: Diagnosis not present

## 2019-07-16 LAB — BLADDER SCAN AMB NON-IMAGING: Scan Result: 257

## 2020-07-15 ENCOUNTER — Ambulatory Visit: Payer: Medicare Other | Admitting: Urology

## 2020-08-17 NOTE — Progress Notes (Signed)
08/18/2020 12:03 PM   [name] Aug 03, 1946 [id number]  Referring provider: Center, [facility] [place],  [place] [address]  Chief Complaint  Patient presents with  . Benign Prostatic Hypertrophy    HPI: Patient is a 74 year old male with nephrolithiasis and BPH with LU TS who presents today for a follow up with care giver, [name].   Nephrolithiasis He had been seen by urology in 2008 for an obstructing left proximal ureteral calculus and bladder calculi. He underwent PCNL and cystolithopaxy in 2008.   Noncontrast CT performed 2011 noted bilateral nephrolithiasis.  He has not had any symptoms of renal colic or gross hematuria.  KUB taken in August 2019 noted no nephrolithiasis.  KUB 07/16/2019 revealed nonobstructive bowel gas pattern.  No definite nephroliths.  Moderate amount of stool throughout the colon, improved from prior study.  BPH WITH LUTS His PVR today is 106 mL.  He is taking finasteride 5 mg daily.  He has no urinary complaints.  Patient denies any modifying or aggravating factors.  Patient denies any gross hematuria, dysuria or suprapubic/flank pain.  Patient denies any fevers, chills, nausea or vomiting.   PMH: Past Medical History:  Diagnosis Date  . Alcoholism ([place])   . Allergy   . Altered mental status 2011   hx of 2/2 hypercalcemia, EtOH w/d and medication overuse  . Angina    mild pain on arm, left chest, moves down left chest  . Chronic renal insufficiency    with history of acute on chronic secondary to dehydration and hypercalcemia  . Dementia ([place])   . DM (diabetes mellitus) ([place])   . Encephalopathy   . Fx humeral neck 2008   right side, no history of surgical repair  . History of nephrolithiasis 2008   s/p ureterolithotomy   . Hypercalcemia    2/2 primary hyperparathyroidism  . Hypertension   . Pancreatitis 2011   history of several admissions for pancreatitis   . Parathyroid adenoma   . Primary  hyperparathyroidism ([place])    s/p parathyroid adenoma resection Oct. 8, 2011, repeat scan showed residual right adenoma, however pt did not f/u with surgery as outpt  . Schizophrenia ([place])    previously on zyprexa  . Tobacco abuse     Surgical History: Past Surgical History:  Procedure Laterality Date  . Ankle Pinning    . cysto, removal of bladder stone, attempted stent placement  08/15/2007  . cystoscopy, laser cystolithoplaxy  08/16/2007  . KIDNEY STONE SURGERY  07/2007 X2   /E-chart  . open left proximal ureterolithotomy  09/18/2007  . parathyroid adenoma resection  Oct. 8, 2011   residual adenoma Oct. 12, 2011  . PARATHYROIDECTOMY  03/09/2012   Procedure: PARATHYROIDECTOMY;  Surgeon: Pedro Earls, MD;  Location: [facility];  Service: General;  Laterality: N/A;  mediastinal paraqthyroidectomy   . THYROIDECTOMY  03/09/12    Home Medications:  Allergies as of 08/18/2020   No Known Allergies     Medication List       Accurate as of August 18, 2020 12:03 PM. If you have any questions, ask your nurse or doctor.        amLODipine 2.5 MG tablet Commonly known as: NORVASC Take 2.5 mg by mouth Nightly.   atorvastatin 20 MG tablet Commonly known as: LIPITOR Take 1 tablet (20 mg total) by mouth daily.   calcitRIOL 0.5 MCG capsule Commonly known as: ROCALTROL Take 1 capsule (0.5 mcg total) by mouth daily.  calcium carbonate 500 MG chewable tablet Commonly known as: TUMS - dosed in mg elemental calcium Chew 2 tablets (400 mg of elemental calcium total) by mouth 3 (three) times daily with meals.   denosumab 60 MG/ML Sosy injection Commonly known as: PROLIA Inject 1 mL into the skin every 6 (six) months.   docusate sodium 100 MG capsule Commonly known as: COLACE Take 1 capsule (100 mg total) by mouth 2 (two) times daily.   ergocalciferol 1.25 MG (50000 UT) capsule Commonly known as: VITAMIN D2 Take 1 capsule (50,000 Units total) by mouth every 6 (six) weeks.     finasteride 5 MG tablet Commonly known as: PROSCAR TAKE ONE TABLET BY MOUTH EVERY DAY FOR ENLARGED PROSTATE   fluticasone 50 MCG/ACT nasal spray Commonly known as: FLONASE USE 2 SPRAYS IN EACH NOSTRIL DAILY AS NEEDED   folic acid 1 MG tablet Commonly known as: FOLVITE Take 1 tablet (1 mg total) by mouth daily.   glucose blood test strip 1 each by Other route 2 (two) times daily. Use as instructed   labetalol 100 MG tablet Commonly known as: NORMODYNE Take 1 tablet (100 mg total) by mouth 2 (two) times daily.   Lantus SoloStar 100 UNIT/ML Solostar Pen Generic drug: insulin glargine Inject 10 Units into the skin Nightly.   Melatonin TR 1 MG Tbcr Generic drug: Melatonin ER TAKE 1 TABLET BY MOUTH EVERY NIGHT AT BEDTIME   metoprolol succinate 25 MG 24 hr tablet Commonly known as: TOPROL-XL TAKE 1 TABLET BY MOUTH DAILY   multivitamin with minerals tablet TAKE 1 TABLET BY MOUTH DAILY   OLANZapine 15 MG tablet Commonly known as: ZYPREXA Take 2 tablets by mouth at bedtime.   oxyCODONE-acetaminophen 5-325 MG tablet Commonly known as: Percocet Take 1 tablet by mouth every 4 (four) hours as needed for severe pain.   pioglitazone 30 MG tablet Commonly known as: ACTOS Take 30 mg by mouth daily.   polyethylene glycol powder 17 GM/SCOOP powder Commonly known as: GLYCOLAX/MIRALAX MIX 17 GRAMS (1 CAPFUL) IN 8 OUNCES OF WATER & DRINK DAILY   thiamine 100 MG tablet Commonly known as: Vitamin B-1 Take 1 tablet (100 mg total) by mouth daily.   TRUEplus Lancets 26G Misc 1 each by Does not apply route 2 (two) times daily.       Allergies: No Known Allergies  Family History: Family History  Problem Relation Age of Onset  . Kidney cancer Neg Hx   . Bladder Cancer Neg Hx   . Prostate cancer Neg Hx     Social History:  reports that he has been smoking cigarettes. He has a 50.00 pack-year smoking history. He has never used smokeless tobacco. He reports that he does not  drink alcohol and does not use drugs.  ROS: For pertinent review of systems please refer to history of present illness  Physical Exam: BP (!) 143/84   Pulse 60   Ht 5\' 9"  (1.753 m)   Wt 165 lb (74.8 kg)   BMI 24.37 kg/m  Constitutional:  Well nourished. Alert and oriented, No acute distress. HEENT: [place] AT, mask in place.  Trachea midline Cardiovascular: No clubbing, cyanosis, or edema. Respiratory: Normal respiratory effort, no increased work of breathing. GU: No CVA tenderness.  No bladder fullness or masses.  Patient with circumcised phallus.  Urethral meatus is patent.  No penile discharge. No penile lesions or rashes. Scrotum without lesions, cysts, rashes and/or edema.  Testicles are located scrotally bilaterally. No masses are appreciated in  the testicles. Left and right epididymis are normal. Rectal: Patient with  normal sphincter tone. Anus and perineum without scarring or rashes. No rectal masses are appreciated. Prostate is approximately 60 grams, could only palpate the apex and the midportion of the gland, no nodules are appreciated. Seminal vesicles could not be palpated Skin: No rashes, bruises or suspicious lesions. Lymph: No inguinal adenopathy. Neurologic: Grossly intact, no focal deficits, moving all 4 extremities. Psychiatric: Normal mood and affect.  Laboratory Data: Urinalysis Component     Latest Ref Rng & Units 08/18/2020  Specific Gravity, UA     1.005 - 1.030 1.020  pH, UA     5.0 - 7.5 5.5  Color, UA     Yellow Yellow  Appearance Ur     Clear Cloudy (A)  Leukocytes,UA     Negative 1+ (A)  Protein,UA     Negative/Trace Trace (A)  Glucose, UA     Negative Negative  Ketones, UA     Negative Negative  RBC, UA     Negative Trace (A)  Bilirubin, UA     Negative Negative  Urobilinogen, Ur     0.2 - 1.0 mg/dL 0.2  Nitrite, UA     Negative Positive (A)  Microscopic Examination      See below:   Component     Latest Ref Rng & Units 08/18/2020  WBC,  UA     0 - 5 /hpf >30 (A)  RBC     0 - 2 /hpf 0-2  Epithelial Cells (non renal)     0 - 10 /hpf 0-10  Bacteria, UA     None seen/Few Moderate (A)   Lab Results  Component Value Date   WBC 4.7 01/18/2019   HGB 10.8 (L) 01/18/2019   HCT 34.2 (L) 01/18/2019   MCV 93.2 01/18/2019   PLT 284 01/18/2019    Lab Results  Component Value Date   CREATININE 3.28 (H) 01/20/2019    Lab Results  Component Value Date   PSA 1.79 Test Methodology: Hybritech PSA 08/13/2007    Lab Results  Component Value Date   HGBA1C >15.5 (H) 04/17/2017        Component Value Date/Time   CHOL 133 03/08/2017 1102   HDL 41 03/08/2017 1102   CHOLHDL 3.2 03/08/2017 1102   CHOLHDL 4.8 08/15/2010 1634   VLDL 28 08/15/2010 1634   LDLCALC 62 03/08/2017 1102    Lab Results  Component Value Date   AST 20 04/17/2017   Lab Results  Component Value Date   ALT 44 04/17/2017   I have reviewed the labs  Pertinent Imaging: Results for KARL, ERWAY (MRN [id number]) as of 08/18/2020 12:01  Ref. Range 08/18/2020 11:34  Scan Result Unknown [id number]     Assessment & Plan:    1. BPH with LUTS Continue conservative management, avoiding bladder irritants and timed voiding's Continue finasteride 5 mg daily RTC in 12 months for I PSS, exam and PVR  2. History of nephrolithiasis Bilateral nephrolithiasis seen on CT in 2011 KUB 06/2019 no stones seen   Return in about 1 year (around 08/18/2021) for PVR and exam .  These notes generated with voice recognition software. I apologize for typographical errors.  Zara Council, PA-C  [facility] [address] [place], [place] [address] [phone]

## 2020-08-18 ENCOUNTER — Other Ambulatory Visit: Payer: Self-pay

## 2020-08-18 ENCOUNTER — Ambulatory Visit (INDEPENDENT_AMBULATORY_CARE_PROVIDER_SITE_OTHER): Payer: Medicare Other | Admitting: Urology

## 2020-08-18 ENCOUNTER — Encounter: Payer: Self-pay | Admitting: Urology

## 2020-08-18 VITALS — BP 143/84 | HR 60 | Ht 69.0 in | Wt 165.0 lb

## 2020-08-18 DIAGNOSIS — N401 Enlarged prostate with lower urinary tract symptoms: Secondary | ICD-10-CM | POA: Diagnosis not present

## 2020-08-18 DIAGNOSIS — N138 Other obstructive and reflux uropathy: Secondary | ICD-10-CM

## 2020-08-18 LAB — MICROSCOPIC EXAMINATION: WBC, UA: >30 /hpf — AB (ref 0–5)

## 2020-08-18 LAB — URINALYSIS, COMPLETE
Bilirubin, UA: NEGATIVE
Glucose, UA: NEGATIVE
Ketones, UA: NEGATIVE
Nitrite, UA: POSITIVE — AB
Specific Gravity, UA: 1.02 (ref 1.005–1.030)
Urobilinogen, Ur: 0.2 mg/dL (ref 0.2–1.0)
pH, UA: 5.5 (ref 5.0–7.5)

## 2020-08-18 LAB — BLADDER SCAN AMB NON-IMAGING: Scan Result: 106

## 2020-08-18 MED ORDER — FINASTERIDE 5 MG PO TABS: ORAL_TABLET | ORAL | 3 refills | Status: AC

## 2020-11-09 ENCOUNTER — Other Ambulatory Visit: Payer: Self-pay

## 2020-11-09 ENCOUNTER — Ambulatory Visit: Payer: Medicare Other | Attending: Internal Medicine

## 2020-11-09 ENCOUNTER — Ambulatory Visit: Payer: Self-pay

## 2020-11-09 DIAGNOSIS — Z23 Encounter for immunization: Secondary | ICD-10-CM

## 2020-11-09 NOTE — Progress Notes (Signed)
   Covid-19 Vaccination Clinic  Name:  RITO LECOMTE    MRN: [id number] DOB: 05-11-1946  11/09/2020  Mr. Pascucci was observed post Covid-19 immunization for 15 minutes without incident. He was provided with Vaccine Information Sheet and instruction to access the V-Safe system.   Mr. Cihlar was instructed to call 911 with any severe reactions post vaccine: [name] Difficulty breathing  . Swelling of face and throat  . A fast heartbeat  . A bad rash all over body  . Dizziness and weakness   Immunizations Administered    Name Date Dose VIS Date Route   Moderna COVID-19 Vaccine 11/09/2020  3:13 PM 0.5 mL 09/16/2020 Intramuscular   Manufacturer: Levan Hurst   Lot: [id number]   [place]: [id number]

## 2020-12-07 ENCOUNTER — Other Ambulatory Visit: Payer: Self-pay

## 2020-12-07 ENCOUNTER — Ambulatory Visit: Payer: Medicare Other | Attending: Internal Medicine

## 2020-12-07 DIAGNOSIS — Z23 Encounter for immunization: Secondary | ICD-10-CM

## 2020-12-07 NOTE — Progress Notes (Signed)
   Covid-19 Vaccination Clinic  Name:  [name]    MRN: [id number] DOB: Jul 06, 1946  12/07/2020  [name] was observed post Covid-19 immunization for 15 minutes without incident. He was provided with Vaccine Information Sheet and instruction to access the V-Safe system.   [name] was instructed to call 911 with any severe reactions post vaccine: [name] Difficulty breathing  . Swelling of face and throat  . A fast heartbeat  . A bad rash all over body  . Dizziness and weakness   Immunizations Administered    Name Date Dose VIS Date Route   Moderna COVID-19 Vaccine 12/07/2020  2:32 PM 0.5 mL 09/16/2020 Intramuscular   Manufacturer: Levan Hurst   Lot: [id number]   [place]: [id number]

## 2021-08-16 NOTE — Progress Notes (Deleted)
08/17/2021 9:57 AM   [name] [birth year] [id number]  Referring provider: Center, [facility] [place],  [place] [address]  Urological history: 1. Nephrolithiasis -left PCNL and cystolithopaxy in 2008 -no stones on 2020 KUB  2. BPH with LU TS -aged out of prostate cancer screening -PVR *** -managed with finasteride 5 mg    No chief complaint on file.   HPI: [name] is a 75 y.o. male who presents today for one year follow up.   PVR ***    PMH: Past Medical History:  Diagnosis Date   Alcoholism ([place])    Allergy    Altered mental status 2011   hx of 2/2 hypercalcemia, EtOH w/d and medication overuse   Angina    mild pain on arm, left chest, moves down left chest   Chronic renal insufficiency    with history of acute on chronic secondary to dehydration and hypercalcemia   Dementia (HCC)    DM (diabetes mellitus) ([place])    Encephalopathy    Fx humeral neck 2008   right side, no history of surgical repair   History of nephrolithiasis 2008   s/p ureterolithotomy    Hypercalcemia    2/2 primary hyperparathyroidism   Hypertension    Pancreatitis 2011   history of several admissions for pancreatitis    Parathyroid adenoma    Primary hyperparathyroidism ([place])    s/p parathyroid adenoma resection Oct. 8, 2011, repeat scan showed residual right adenoma, however pt did not f/u with surgery as outpt   Schizophrenia ([place])    previously on zyprexa   Tobacco abuse     Surgical History: Past Surgical History:  Procedure Laterality Date   Ankle Pinning     cysto, removal of bladder stone, attempted stent placement  08/15/2007   cystoscopy, laser cystolithoplaxy  08/16/2007   KIDNEY STONE SURGERY  07/2007 X2   /E-chart   open left proximal ureterolithotomy  09/18/2007   parathyroid adenoma resection  Oct. 8, 2011   residual adenoma Oct. 12, 2011   PARATHYROIDECTOMY  03/09/2012   Procedure: PARATHYROIDECTOMY;  Surgeon:  Pedro Earls, MD;  Location: [place];  Service: General;  Laterality: N/A;  mediastinal paraqthyroidectomy    THYROIDECTOMY  03/09/12    Home Medications:  Allergies as of 08/17/2021   No Known Allergies      Medication List        Accurate as of August 16, 2021  9:57 AM. If you have any questions, ask your nurse or doctor.          amLODipine 2.5 MG tablet Commonly known as: NORVASC Take 2.5 mg by mouth Nightly.   atorvastatin 20 MG tablet Commonly known as: LIPITOR Take 1 tablet (20 mg total) by mouth daily.   calcitRIOL 0.5 MCG capsule Commonly known as: ROCALTROL Take 1 capsule (0.5 mcg total) by mouth daily.   calcium carbonate 500 MG chewable tablet Commonly known as: TUMS - dosed in mg elemental calcium Chew 2 tablets (400 mg of elemental calcium total) by mouth 3 (three) times daily with meals.   denosumab 60 MG/ML Sosy injection Commonly known as: PROLIA Inject 1 mL into the skin every 6 (six) months.   docusate sodium 100 MG capsule Commonly known as: COLACE Take 1 capsule (100 mg total) by mouth 2 (two) times daily.   ergocalciferol 1.25 MG (50000 UT) capsule Commonly known as: VITAMIN D2 Take 1 capsule (50,000 Units total) by mouth every 6 (  six) weeks.   finasteride 5 MG tablet Commonly known as: PROSCAR TAKE ONE TABLET BY MOUTH EVERY DAY FOR ENLARGED PROSTATE   fluticasone 50 MCG/ACT nasal spray Commonly known as: FLONASE USE 2 SPRAYS IN EACH NOSTRIL DAILY AS NEEDED   folic acid 1 MG tablet Commonly known as: FOLVITE Take 1 tablet (1 mg total) by mouth daily.   glucose blood test strip 1 each by Other route 2 (two) times daily. Use as instructed   labetalol 100 MG tablet Commonly known as: NORMODYNE Take 1 tablet (100 mg total) by mouth 2 (two) times daily.   Lantus SoloStar 100 UNIT/ML Solostar Pen Generic drug: insulin glargine Inject 10 Units into the skin Nightly.   Melatonin TR 1 MG Tbcr Generic drug: Melatonin ER TAKE 1  TABLET BY MOUTH EVERY NIGHT AT BEDTIME   metoprolol succinate 25 MG 24 hr tablet Commonly known as: TOPROL-XL TAKE 1 TABLET BY MOUTH DAILY   multivitamin with minerals tablet TAKE 1 TABLET BY MOUTH DAILY   OLANZapine 15 MG tablet Commonly known as: ZYPREXA Take 2 tablets by mouth at bedtime.   oxyCODONE-acetaminophen 5-325 MG tablet Commonly known as: Percocet Take 1 tablet by mouth every 4 (four) hours as needed for severe pain.   pioglitazone 30 MG tablet Commonly known as: ACTOS Take 30 mg by mouth daily.   polyethylene glycol powder 17 GM/SCOOP powder Commonly known as: GLYCOLAX/MIRALAX MIX 17 GRAMS (1 CAPFUL) IN 8 OUNCES OF WATER & DRINK DAILY   thiamine 100 MG tablet Commonly known as: Vitamin B-1 Take 1 tablet (100 mg total) by mouth daily.   TRUEplus Lancets 26G Misc 1 each by Does not apply route 2 (two) times daily.        Allergies: No Known Allergies  Family History: Family History  Problem Relation Age of Onset   Kidney cancer Neg Hx    Bladder Cancer Neg Hx    Prostate cancer Neg Hx     Social History:  reports that he has been smoking cigarettes. He has a 50.00 pack-year smoking history. He has never used smokeless tobacco. He reports that he does not drink alcohol and does not use drugs.  ROS: For pertinent review of systems please refer to history of present illness  Physical Exam: There were no vitals taken for this visit. Constitutional:  Well nourished. Alert and oriented, No acute distress. HEENT: [place] AT, moist mucus membranes.  Trachea midline Cardiovascular: No clubbing, cyanosis, or edema. Respiratory: Normal respiratory effort, no increased work of breathing. GI: Abdomen is soft, non tender, non distended, no abdominal masses. Liver and spleen not palpable.  No hernias appreciated.  Stool sample for occult testing is not indicated.   GU: No CVA tenderness.  No bladder fullness or masses.  Patient with circumcised/uncircumcised phallus.  ***Foreskin easily retracted***  Urethral meatus is patent.  No penile discharge. No penile lesions or rashes. Scrotum without lesions, cysts, rashes and/or edema.  Testicles are located scrotally bilaterally. No masses are appreciated in the testicles. Left and right epididymis are normal. Rectal: Patient with  normal sphincter tone. Anus and perineum without scarring or rashes. No rectal masses are appreciated. Prostate is approximately *** grams, *** nodules are appreciated. Seminal vesicles are normal. Skin: No rashes, bruises or suspicious lesions. Lymph: No inguinal adenopathy. Neurologic: Grossly intact, no focal deficits, moving all 4 extremities. Psychiatric: Normal mood and affect.    Laboratory Data: WBC 3.8 - 10.8 Thousand/uL 4.0   RBC 4.2 - 5.8 Million/uL 4.08  Low    Hemoglobin 13.2 - 17.1 g/dL 12.1 Low    Hematocrit 38.5 - 50 % 37.6 Low    MCV 80 - 100 fL 92.2   MCH 27 - 33 pg 29.7   MCHC 32 - 36 g/dL 32.2   RDW 11 - 15 % 14.5   Platelets 140 - 400 Thousand/uL 239   MPV 7.5 - 12.5 fL 9.3   Resulting Agency  [facility]  Resulting Agency Comment  Performing Organization Information:      Site ID: [id number]      Name: [facility]-      Address: [address], [address] [place], [place] [address]      Director: Cathleen Fears [place] Specimen Collected: 07/20/21 10:38 Last Resulted: 07/21/21 11:44  Received From: [facility]  Result Received: 08/16/21 09:56     Glucose 65 - 99 mg/dL 97                                                                                               Fasting reference interval                                             BUN 7 - 25 mg/dL 60 High     Creatinine 0.7 - 1.28 mg/dL 3.68 High     eGFR CKD-EPI CR 2021 > OR = 60 mL/min/1.57m 17 Low        The eGFR is based on the CKD-EPI 2021 equation. To calculate       the new eGFR from a previous Creatinine or Cystatin C       result, go to https://www.kidney.org/professionals/        kdoqi/gfr%5Fcalculator  BUN/Creatinine Ratio 6 - 22 (calc) 16    Sodium 135 - 146 mmol/L 143    Potassium 3.5 - 5.3 mmol/L 5.2    Chloride 98 - 110 mmol/L 112 High                                           Verified by repeat analysis.                                             Bicarbonate (CO2) 20 - 32 mmol/L 25    Calcium 8.6 - 10.3 mg/dL 7.9 Low     Total Protein 6.1 - 8.1 g/dL 7.6    Albumin 3.6 - 5.1 g/dL 4.5    Globulin, Total 1.9 - 3.7 g/dL (calc) 3.1    A/G Ratio 1 - 2.5 (calc) 1.5    Total Bilirubin 0.2 - 1.2 mg/dL 0.5    Alkaline Phosphatase 35 - 144 U/L 45    AST (SGOT) 10 - 35 U/L 18    ALT (SGPT) 9 - 46  U/L 10    Resulting Agency  [facility]   Resulting Agency Comment  Performing Organization Information:      Site ID: [id number]      Name: [facility]      Address: [address] [place], [place] [address]      Director: Cathleen Fears Hessling Specimen Collected: 07/20/21 10:38 Last Resulted: 07/21/21 11:44  Received From: [facility]  Result Received: 08/16/21 09:56   Hemoglobin A1C <5.7 % of total Hgb 5.8 High        For someone without known diabetes, a hemoglobin       A1c value between 5.7% and 6.4% is consistent with       prediabetes and should be confirmed with a       follow-up test.               For someone with known diabetes, a value <7%       indicates that their diabetes is well controlled. A1c       targets should be individualized based on duration of       diabetes, age, comorbid conditions, and other       considerations.               This assay result is consistent with an increased risk       of diabetes.               Currently, no consensus exists regarding use of       hemoglobin A1c for diagnosis of diabetes for children.  Resulting Agency  [facility]   Resulting Agency Comment  Performing Organization Information:      Site ID: [id number]      Name: [facility]      Address: [address]  [address] [place], [place] [address]      Director: Cathleen Fears [place] Specimen Collected: 07/20/21 10:38 Last Resulted: 07/21/21 11:44  Received From: [facility]  Result Received: 08/16/21 09:56   I have reviewed the labs.   Pertinent Imaging: ***    Assessment & Plan:    1. BPH with LUTS -PVR < 300 cc -symptoms - *** -most bothersome symptoms are *** -continue conservative management, avoiding bladder irritants and timed voiding's -Initiate alpha-blocker (***), discussed side effects *** -Initiate 5 alpha reductase inhibitor (***), discussed side effects *** -Continue tamsulosin 0.4 mg daily, alfuzosin 10 mg daily, Rapaflo 8 mg daily, terazosin, doxazosin, Cialis 5 mg daily and finasteride 5 mg daily, dutasteride 0.5 mg daily***:refills given -Cannot tolerate medication or medication failure, schedule cystoscopy ***     No follow-ups on file.  These notes generated with voice recognition software. I apologize for typographical errors.  Zara Council, PA-C  [facility] [address] [place], [place] [address] [phone]

## 2021-08-17 ENCOUNTER — Ambulatory Visit: Payer: Medicare Other | Admitting: Urology

## 2021-08-17 DIAGNOSIS — N138 Other obstructive and reflux uropathy: Secondary | ICD-10-CM

## 2021-08-19 ENCOUNTER — Encounter: Payer: Self-pay | Admitting: Urology

## 2022-05-02 ENCOUNTER — Other Ambulatory Visit (INDEPENDENT_AMBULATORY_CARE_PROVIDER_SITE_OTHER): Payer: Self-pay | Admitting: Nephrology

## 2022-05-02 DIAGNOSIS — N186 End stage renal disease: Secondary | ICD-10-CM

## 2022-05-03 ENCOUNTER — Encounter (INDEPENDENT_AMBULATORY_CARE_PROVIDER_SITE_OTHER): Payer: Medicare Other | Admitting: Vascular Surgery

## 2022-05-03 ENCOUNTER — Other Ambulatory Visit (INDEPENDENT_AMBULATORY_CARE_PROVIDER_SITE_OTHER): Payer: Self-pay | Admitting: Nephrology

## 2022-05-03 ENCOUNTER — Other Ambulatory Visit (INDEPENDENT_AMBULATORY_CARE_PROVIDER_SITE_OTHER): Payer: Self-pay | Admitting: Vascular Surgery

## 2022-05-03 ENCOUNTER — Other Ambulatory Visit (INDEPENDENT_AMBULATORY_CARE_PROVIDER_SITE_OTHER): Payer: Medicare Other

## 2022-05-03 ENCOUNTER — Encounter (INDEPENDENT_AMBULATORY_CARE_PROVIDER_SITE_OTHER): Payer: Medicare Other

## 2022-05-03 DIAGNOSIS — N184 Chronic kidney disease, stage 4 (severe): Secondary | ICD-10-CM

## 2022-06-29 ENCOUNTER — Ambulatory Visit (INDEPENDENT_AMBULATORY_CARE_PROVIDER_SITE_OTHER): Payer: Medicare Other

## 2022-06-29 ENCOUNTER — Ambulatory Visit (INDEPENDENT_AMBULATORY_CARE_PROVIDER_SITE_OTHER): Payer: Medicare Other | Admitting: Nurse Practitioner

## 2022-06-29 ENCOUNTER — Encounter (INDEPENDENT_AMBULATORY_CARE_PROVIDER_SITE_OTHER): Payer: Self-pay | Admitting: Nurse Practitioner

## 2022-06-29 VITALS — BP 120/75 | HR 61 | Resp 17 | Ht 68.0 in | Wt 172.6 lb

## 2022-06-29 DIAGNOSIS — N184 Chronic kidney disease, stage 4 (severe): Secondary | ICD-10-CM | POA: Diagnosis not present

## 2022-06-29 DIAGNOSIS — E1165 Type 2 diabetes mellitus with hyperglycemia: Secondary | ICD-10-CM

## 2022-06-30 ENCOUNTER — Telehealth (INDEPENDENT_AMBULATORY_CARE_PROVIDER_SITE_OTHER): Payer: Self-pay

## 2022-06-30 NOTE — Telephone Encounter (Signed)
Spoke with [name] the patient's caregiver and the patient is scheduled with Dr. Lucky Cowboy on 07/04/22 for a permcath insertion with a 11:15 am arrival time to the MM. Pre-procedure instructions were discussed and the caregiver and his spouse stated they wrote it down. Information will be mailed. Per patient's caregiver they requested the information be mailed to [place], [place] [address]

## 2022-07-03 ENCOUNTER — Encounter (INDEPENDENT_AMBULATORY_CARE_PROVIDER_SITE_OTHER): Payer: Self-pay | Admitting: Nurse Practitioner

## 2022-07-03 NOTE — Progress Notes (Signed)
Subjective:    Patient ID: [name], male    DOB: 1945-12-01, 76 y.o.   MRN: [id number] Chief Complaint  Patient presents with   Establish Care    Referred bt dr Candiss Norse    The patient is seen for evaluation for dialysis access. The patient has chronic renal insufficiency stage V secondary to hypertension. The patient's most recent creatinine clearance is less than 20. The patient volume status has not yet become an issue. Patient's blood pressures been relatively well controlled. There are mild uremic symptoms which appear to be relatively well tolerated at this time.  The patient notes the kidney problem has been present for a long time and has been progressively getting worse.  The patient is followed by nephrology.    The patient is right-handed.  The patient has been considering the various methods of dialysis and wishes to proceed with hemodialysis and therefore creation of AV access.  No recent shortening of the patient's walking distance or new symptoms consistent with claudication.  No history of rest pain symptoms. No new ulcers or wounds of the lower extremities have occurred.  The patient denies amaurosis fugax or recent TIA symptoms. There are no recent neurological changes noted. There is no history of DVT, PE or superficial thrombophlebitis. No recent episodes of angina or shortness of breath documented.    The patient does not have adequate vein diameters for creation of an AV graft.  Should have a left brachial axillary AV graft inserted.    Review of Systems  All other systems reviewed and are negative.      Objective:   Physical Exam Vitals reviewed.  HENT:     Head: Normocephalic.  Cardiovascular:     Rate and Rhythm: Normal rate.     Pulses: Normal pulses.  Pulmonary:     Effort: Pulmonary effort is normal.  Skin:    General: Skin is warm and dry.  Neurological:     Mental Status: He is alert and oriented to person, place, and time.   Psychiatric:        Mood and Affect: Mood normal.        Behavior: Behavior normal.        Thought Content: Thought content normal.        Judgment: Judgment normal.     BP 120/75   Pulse 61   Resp 17   Ht '5\' 8"'$  (1.727 m)   Wt 172 lb 9.6 oz (78.3 kg)   BMI 26.24 kg/m   Past Medical History:  Diagnosis Date   Alcoholism ([place])    Allergy    Altered mental status 2011   hx of 2/2 hypercalcemia, EtOH w/d and medication overuse   Angina    mild pain on arm, left chest, moves down left chest   Chronic renal insufficiency    with history of acute on chronic secondary to dehydration and hypercalcemia   Dementia (HCC)    DM (diabetes mellitus) ([place])    Encephalopathy    Fx humeral neck 2008   right side, no history of surgical repair   History of nephrolithiasis 2008   s/p ureterolithotomy    Hypercalcemia    2/2 primary hyperparathyroidism   Hypertension    Pancreatitis 2011   history of several admissions for pancreatitis    Parathyroid adenoma    Primary hyperparathyroidism ([place])    s/p parathyroid adenoma resection Oct. 8, 2011, repeat scan showed residual right adenoma, however pt did not f/u  with surgery as outpt   Schizophrenia ([place])    previously on zyprexa   Tobacco abuse     Social History   Socioeconomic History   Marital status: Single    Spouse name: Not on file   Number of children: Not on file   Years of education: Not on file   Highest education level: Not on file  Occupational History   Occupation: disabled  Tobacco Use   Smoking status: Every Day    Packs/day: 1.00    Years: 50.00    Total pack years: 50.00    Types: Cigarettes   Smokeless tobacco: Never  Substance and Sexual Activity   Alcohol use: No    Comment: 03/09/12 "last alcohol 1983"; history of heavy alcohol use   Drug use: No    Types: Marijuana    Comment: 03/09/12 Last drug use "~1978"   Sexual activity: Never  Other Topics Concern   Not on file  Social History Narrative    Patient is disabled. Patient lives with brother currently. Patient has history of being in prison.   History of alcoholism   Social Determinants of Health   Financial Resource Strain: Not on file  Food Insecurity: Not on file  Transportation Needs: Not on file  Physical Activity: Not on file  Stress: Not on file  Social Connections: Not on file  Intimate Partner Violence: Not on file    Past Surgical History:  Procedure Laterality Date   Ankle Pinning     cysto, removal of bladder stone, attempted stent placement  08/15/2007   cystoscopy, laser cystolithoplaxy  08/16/2007   KIDNEY STONE SURGERY  07/2007 X2   /E-chart   open left proximal ureterolithotomy  09/18/2007   parathyroid adenoma resection  Oct. 8, 2011   residual adenoma Oct. 12, 2011   PARATHYROIDECTOMY  03/09/2012   Procedure: PARATHYROIDECTOMY;  Surgeon: Pedro Earls, MD;  Location: [facility];  Service: General;  Laterality: N/A;  mediastinal paraqthyroidectomy    THYROIDECTOMY  03/09/12    Family History  Problem Relation Age of Onset   Kidney cancer Neg Hx    Bladder Cancer Neg Hx    Prostate cancer Neg Hx     No Known Allergies     Latest Ref Rng & Units 01/18/2019    6:14 AM 01/17/2019    4:20 PM 04/18/2017    5:51 AM  CBC  WBC 4.0 - 10.5 K/uL 4.7  4.0  8.8   Hemoglobin 13.0 - 17.0 g/dL 10.8  11.1  13.2   Hematocrit 39.0 - 52.0 % 34.2  35.2  38.3   Platelets 150 - 400 K/uL 284  271  240       CMP     Component Value Date/Time   NA 141 01/20/2019 0924   NA 135 03/08/2017 1102   K 5.2 (H) 01/20/2019 0924   CL 109 01/20/2019 0924   CO2 24 01/20/2019 0924   GLUCOSE 118 (H) 01/20/2019 0924   BUN 48 (H) 01/20/2019 0924   BUN 45 (H) 03/08/2017 1102   CREATININE 3.28 (H) 01/20/2019 0924   CALCIUM 7.4 (L) 01/20/2019 0924   CALCIUM >15.0 (HH) 11/09/2011 1455   PROT 8.3 (H) 04/17/2017 1448   ALBUMIN 4.1 01/18/2019 1040   ALBUMIN 4.1 03/08/2017 1102   AST 20 04/17/2017 1448   ALT 44 04/17/2017 1448    ALKPHOS 94 04/17/2017 1448   BILITOT 0.9 04/17/2017 1448   GFRNONAA 18 (L) 01/20/2019 0924   GFRAA  21 (L) 01/20/2019 0924     No results found.     Assessment & Plan:   1. CKD (chronic kidney disease) stage 4, GFR 15-29 ml/min (HCC) Recommend:  At this time the patient does not have appropriate extremity access for dialysis  Patient should have a left brachial axillary AV graft created.  The risks, benefits and alternative therapies were reviewed in detail with the patient.  All questions were answered.  The patient agrees to proceed with surgery.   The patient will follow up with me in the office after the surgery.   2. Uncontrolled type 2 diabetes mellitus with hyperglycemia, without long-term current use of insulin (HCC) Continue hypoglycemic medications as already ordered, these medications have been reviewed and there are no changes at this time.  Hgb A1C to be monitored as already arranged by primary service    Current Outpatient Medications on File Prior to Visit  Medication Sig Dispense Refill   atorvastatin (LIPITOR) 20 MG tablet Take 1 tablet (20 mg total) by mouth daily. 30 tablet 6   calcitRIOL (ROCALTROL) 0.5 MCG capsule Take 1 capsule (0.5 mcg total) by mouth daily. 30 capsule 2   calcium carbonate (TUMS - DOSED IN MG ELEMENTAL CALCIUM) 500 MG chewable tablet Chew 2 tablets (400 mg of elemental calcium total) by mouth 3 (three) times daily with meals. 60 tablet 2   cloNIDine (CATAPRES) 0.1 MG tablet Take 0.1 mg by mouth 2 (two) times daily.     denosumab (PROLIA) 60 MG/ML SOSY injection Inject 1 mL into the skin every 6 (six) months.     docusate sodium (COLACE) 100 MG capsule Take 1 capsule (100 mg total) by mouth 2 (two) times daily. 60 capsule 6   doxazosin (CARDURA) 1 MG tablet Take 1 mg by mouth daily.     ergocalciferol (VITAMIN D2) 50000 units capsule Take 1 capsule (50,000 Units total) by mouth every 6 (six) weeks. 30 capsule 6   finasteride (PROSCAR) 5 MG  tablet TAKE ONE TABLET BY MOUTH EVERY DAY FOR ENLARGED PROSTATE 90 tablet 3   fluticasone (FLONASE) 50 MCG/ACT nasal spray USE 2 SPRAYS IN EACH NOSTRIL DAILY AS NEEDED 16 g 11   folic acid (FOLVITE) 1 MG tablet Take 1 tablet (1 mg total) by mouth daily. 30 tablet 6   gabapentin (NEURONTIN) 300 MG capsule Take by mouth.     glucose blood test strip 1 each by Other route 2 (two) times daily. Use as instructed 100 each 5   Insulin Glargine (LANTUS SOLOSTAR) 100 UNIT/ML Solostar Pen Inject 10 Units into the skin Nightly.     labetalol (NORMODYNE) 100 MG tablet Take 1 tablet (100 mg total) by mouth 2 (two) times daily. 60 tablet 6   levofloxacin (LEVAQUIN) 750 MG tablet Take 750 mg by mouth daily.     losartan (COZAAR) 100 MG tablet Take 100 mg by mouth daily.     MELATONIN TR 1 MG TBCR TAKE 1 TABLET BY MOUTH EVERY NIGHT AT BEDTIME 30 tablet 0   metoprolol succinate (TOPROL-XL) 25 MG 24 hr tablet TAKE 1 TABLET BY MOUTH DAILY 30 tablet 0   Multiple Vitamins-Minerals (MULTIVITAMIN WITH MINERALS) tablet TAKE 1 TABLET BY MOUTH DAILY 30 tablet 5   Naproxen Sodium 220 MG CAPS Take by mouth.     nitrofurantoin, macrocrystal-monohydrate, (MACROBID) 100 MG capsule Take 100 mg by mouth 2 (two) times daily.     OLANZapine (ZYPREXA) 15 MG tablet Take 2 tablets by mouth at bedtime.  oxyCODONE-acetaminophen (PERCOCET) 5-325 MG tablet Take 1 tablet by mouth every 4 (four) hours as needed for severe pain. 10 tablet 0   pioglitazone (ACTOS) 30 MG tablet Take 30 mg by mouth daily.     polyethylene glycol powder (GLYCOLAX/MIRALAX) powder MIX 17 GRAMS (1 CAPFUL) IN 8 OUNCES OF WATER & DRINK DAILY 527 g 0   predniSONE (DELTASONE) 20 MG tablet Take 20 mg by mouth daily.     thiamine (VITAMIN B-1) 100 MG tablet Take 1 tablet (100 mg total) by mouth daily. 30 tablet 6   traZODone (DESYREL) 50 MG tablet Take 50 mg by mouth at bedtime.     TRUEPLUS LANCETS 26G MISC 1 each by Does not apply route 2 (two) times daily. 100  each 5   amLODipine (NORVASC) 2.5 MG tablet Take 2.5 mg by mouth Nightly. (Patient not taking: Reported on 06/29/2022)     No current facility-administered medications on file prior to visit.    There are no Patient Instructions on file for this visit. No follow-ups on file.   Kris Hartmann, NP

## 2022-07-03 NOTE — H&P (View-Only) (Signed)
Subjective:    Patient ID: [name], male    DOB: May 31, 1946, 76 y.o.   MRN: [id number] Chief Complaint  Patient presents with   Establish Care    Referred bt dr Candiss Norse    The patient is seen for evaluation for dialysis access. The patient has chronic renal insufficiency stage V secondary to hypertension. The patient's most recent creatinine clearance is less than 20. The patient volume status has not yet become an issue. Patient's blood pressures been relatively well controlled. There are mild uremic symptoms which appear to be relatively well tolerated at this time.  The patient notes the kidney problem has been present for a long time and has been progressively getting worse.  The patient is followed by nephrology.    The patient is right-handed.  The patient has been considering the various methods of dialysis and wishes to proceed with hemodialysis and therefore creation of AV access.  No recent shortening of the patient's walking distance or new symptoms consistent with claudication.  No history of rest pain symptoms. No new ulcers or wounds of the lower extremities have occurred.  The patient denies amaurosis fugax or recent TIA symptoms. There are no recent neurological changes noted. There is no history of DVT, PE or superficial thrombophlebitis. No recent episodes of angina or shortness of breath documented.    The patient does not have adequate vein diameters for creation of an AV graft.  Should have a left brachial axillary AV graft inserted.    Review of Systems  All other systems reviewed and are negative.      Objective:   Physical Exam Vitals reviewed.  HENT:     Head: Normocephalic.  Cardiovascular:     Rate and Rhythm: Normal rate.     Pulses: Normal pulses.  Pulmonary:     Effort: Pulmonary effort is normal.  Skin:    General: Skin is warm and dry.  Neurological:     Mental Status: He is alert and oriented to person, place, and time.   Psychiatric:        Mood and Affect: Mood normal.        Behavior: Behavior normal.        Thought Content: Thought content normal.        Judgment: Judgment normal.     BP 120/75   Pulse 61   Resp 17   Ht '5\' 8"'$  (1.727 m)   Wt 172 lb 9.6 oz (78.3 kg)   BMI 26.24 kg/m   Past Medical History:  Diagnosis Date   Alcoholism ([place])    Allergy    Altered mental status 2011   hx of 2/2 hypercalcemia, EtOH w/d and medication overuse   Angina    mild pain on arm, left chest, moves down left chest   Chronic renal insufficiency    with history of acute on chronic secondary to dehydration and hypercalcemia   Dementia (HCC)    DM (diabetes mellitus) ([place])    Encephalopathy    Fx humeral neck 2008   right side, no history of surgical repair   History of nephrolithiasis 2008   s/p ureterolithotomy    Hypercalcemia    2/2 primary hyperparathyroidism   Hypertension    Pancreatitis 2011   history of several admissions for pancreatitis    Parathyroid adenoma    Primary hyperparathyroidism ([place])    s/p parathyroid adenoma resection Oct. 8, 2011, repeat scan showed residual right adenoma, however pt did not f/u  with surgery as outpt   Schizophrenia ([place])    previously on zyprexa   Tobacco abuse     Social History   Socioeconomic History   Marital status: Single    Spouse name: Not on file   Number of children: Not on file   Years of education: Not on file   Highest education level: Not on file  Occupational History   Occupation: disabled  Tobacco Use   Smoking status: Every Day    Packs/day: 1.00    Years: 50.00    Total pack years: 50.00    Types: Cigarettes   Smokeless tobacco: Never  Substance and Sexual Activity   Alcohol use: No    Comment: 03/09/12 "last alcohol 1983"; history of heavy alcohol use   Drug use: No    Types: Marijuana    Comment: 03/09/12 Last drug use "~1978"   Sexual activity: Never  Other Topics Concern   Not on file  Social History Narrative    Patient is disabled. Patient lives with brother currently. Patient has history of being in prison.   History of alcoholism   Social Determinants of Health   Financial Resource Strain: Not on file  Food Insecurity: Not on file  Transportation Needs: Not on file  Physical Activity: Not on file  Stress: Not on file  Social Connections: Not on file  Intimate Partner Violence: Not on file    Past Surgical History:  Procedure Laterality Date   Ankle Pinning     cysto, removal of bladder stone, attempted stent placement  08/15/2007   cystoscopy, laser cystolithoplaxy  08/16/2007   KIDNEY STONE SURGERY  07/2007 X2   /E-chart   open left proximal ureterolithotomy  09/18/2007   parathyroid adenoma resection  Oct. 8, 2011   residual adenoma Oct. 12, 2011   PARATHYROIDECTOMY  03/09/2012   Procedure: PARATHYROIDECTOMY;  Surgeon: Pedro Earls, MD;  Location: [facility];  Service: General;  Laterality: N/A;  mediastinal paraqthyroidectomy    THYROIDECTOMY  03/09/12    Family History  Problem Relation Age of Onset   Kidney cancer Neg Hx    Bladder Cancer Neg Hx    Prostate cancer Neg Hx     No Known Allergies     Latest Ref Rng & Units 01/18/2019    6:14 AM 01/17/2019    4:20 PM 04/18/2017    5:51 AM  CBC  WBC 4.0 - 10.5 K/uL 4.7  4.0  8.8   Hemoglobin 13.0 - 17.0 g/dL 10.8  11.1  13.2   Hematocrit 39.0 - 52.0 % 34.2  35.2  38.3   Platelets 150 - 400 K/uL 284  271  240       CMP     Component Value Date/Time   NA 141 01/20/2019 0924   NA 135 03/08/2017 1102   K 5.2 (H) 01/20/2019 0924   CL 109 01/20/2019 0924   CO2 24 01/20/2019 0924   GLUCOSE 118 (H) 01/20/2019 0924   BUN 48 (H) 01/20/2019 0924   BUN 45 (H) 03/08/2017 1102   CREATININE 3.28 (H) 01/20/2019 0924   CALCIUM 7.4 (L) 01/20/2019 0924   CALCIUM >15.0 (HH) 11/09/2011 1455   PROT 8.3 (H) 04/17/2017 1448   ALBUMIN 4.1 01/18/2019 1040   ALBUMIN 4.1 03/08/2017 1102   AST 20 04/17/2017 1448   ALT 44 04/17/2017 1448    ALKPHOS 94 04/17/2017 1448   BILITOT 0.9 04/17/2017 1448   GFRNONAA 18 (L) 01/20/2019 0924   GFRAA  21 (L) 01/20/2019 0924     No results found.     Assessment & Plan:   1. CKD (chronic kidney disease) stage 4, GFR 15-29 ml/min (HCC) Recommend:  At this time the patient does not have appropriate extremity access for dialysis  Patient should have a left brachial axillary AV graft created.  The risks, benefits and alternative therapies were reviewed in detail with the patient.  All questions were answered.  The patient agrees to proceed with surgery.   The patient will follow up with me in the office after the surgery.   2. Uncontrolled type 2 diabetes mellitus with hyperglycemia, without long-term current use of insulin (HCC) Continue hypoglycemic medications as already ordered, these medications have been reviewed and there are no changes at this time.  Hgb A1C to be monitored as already arranged by primary service    Current Outpatient Medications on File Prior to Visit  Medication Sig Dispense Refill   atorvastatin (LIPITOR) 20 MG tablet Take 1 tablet (20 mg total) by mouth daily. 30 tablet 6   calcitRIOL (ROCALTROL) 0.5 MCG capsule Take 1 capsule (0.5 mcg total) by mouth daily. 30 capsule 2   calcium carbonate (TUMS - DOSED IN MG ELEMENTAL CALCIUM) 500 MG chewable tablet Chew 2 tablets (400 mg of elemental calcium total) by mouth 3 (three) times daily with meals. 60 tablet 2   cloNIDine (CATAPRES) 0.1 MG tablet Take 0.1 mg by mouth 2 (two) times daily.     denosumab (PROLIA) 60 MG/ML SOSY injection Inject 1 mL into the skin every 6 (six) months.     docusate sodium (COLACE) 100 MG capsule Take 1 capsule (100 mg total) by mouth 2 (two) times daily. 60 capsule 6   doxazosin (CARDURA) 1 MG tablet Take 1 mg by mouth daily.     ergocalciferol (VITAMIN D2) 50000 units capsule Take 1 capsule (50,000 Units total) by mouth every 6 (six) weeks. 30 capsule 6   finasteride (PROSCAR) 5 MG  tablet TAKE ONE TABLET BY MOUTH EVERY DAY FOR ENLARGED PROSTATE 90 tablet 3   fluticasone (FLONASE) 50 MCG/ACT nasal spray USE 2 SPRAYS IN EACH NOSTRIL DAILY AS NEEDED 16 g 11   folic acid (FOLVITE) 1 MG tablet Take 1 tablet (1 mg total) by mouth daily. 30 tablet 6   gabapentin (NEURONTIN) 300 MG capsule Take by mouth.     glucose blood test strip 1 each by Other route 2 (two) times daily. Use as instructed 100 each 5   Insulin Glargine (LANTUS SOLOSTAR) 100 UNIT/ML Solostar Pen Inject 10 Units into the skin Nightly.     labetalol (NORMODYNE) 100 MG tablet Take 1 tablet (100 mg total) by mouth 2 (two) times daily. 60 tablet 6   levofloxacin (LEVAQUIN) 750 MG tablet Take 750 mg by mouth daily.     losartan (COZAAR) 100 MG tablet Take 100 mg by mouth daily.     MELATONIN TR 1 MG TBCR TAKE 1 TABLET BY MOUTH EVERY NIGHT AT BEDTIME 30 tablet 0   metoprolol succinate (TOPROL-XL) 25 MG 24 hr tablet TAKE 1 TABLET BY MOUTH DAILY 30 tablet 0   Multiple Vitamins-Minerals (MULTIVITAMIN WITH MINERALS) tablet TAKE 1 TABLET BY MOUTH DAILY 30 tablet 5   Naproxen Sodium 220 MG CAPS Take by mouth.     nitrofurantoin, macrocrystal-monohydrate, (MACROBID) 100 MG capsule Take 100 mg by mouth 2 (two) times daily.     OLANZapine (ZYPREXA) 15 MG tablet Take 2 tablets by mouth at bedtime.  oxyCODONE-acetaminophen (PERCOCET) 5-325 MG tablet Take 1 tablet by mouth every 4 (four) hours as needed for severe pain. 10 tablet 0   pioglitazone (ACTOS) 30 MG tablet Take 30 mg by mouth daily.     polyethylene glycol powder (GLYCOLAX/MIRALAX) powder MIX 17 GRAMS (1 CAPFUL) IN 8 OUNCES OF WATER & DRINK DAILY 527 g 0   predniSONE (DELTASONE) 20 MG tablet Take 20 mg by mouth daily.     thiamine (VITAMIN B-1) 100 MG tablet Take 1 tablet (100 mg total) by mouth daily. 30 tablet 6   traZODone (DESYREL) 50 MG tablet Take 50 mg by mouth at bedtime.     TRUEPLUS LANCETS 26G MISC 1 each by Does not apply route 2 (two) times daily. 100  each 5   amLODipine (NORVASC) 2.5 MG tablet Take 2.5 mg by mouth Nightly. (Patient not taking: Reported on 06/29/2022)     No current facility-administered medications on file prior to visit.    There are no Patient Instructions on file for this visit. No follow-ups on file.   Kris Hartmann, NP

## 2022-07-04 ENCOUNTER — Encounter: Payer: Self-pay | Admitting: Vascular Surgery

## 2022-07-04 ENCOUNTER — Other Ambulatory Visit: Payer: Self-pay

## 2022-07-04 ENCOUNTER — Ambulatory Visit
Admission: RE | Admit: 2022-07-04 | Discharge: 2022-07-04 | Disposition: A | Discharge status: Home / Self Care | Payer: Medicare Other | Attending: Vascular Surgery | Admitting: Vascular Surgery

## 2022-07-04 ENCOUNTER — Encounter
Admission: RE | Disposition: A | Discharge status: Home / Self Care | Payer: Self-pay | Source: Home / Self Care | Attending: Vascular Surgery

## 2022-07-04 DIAGNOSIS — E1122 Type 2 diabetes mellitus with diabetic chronic kidney disease: Secondary | ICD-10-CM | POA: Insufficient documentation

## 2022-07-04 DIAGNOSIS — E1165 Type 2 diabetes mellitus with hyperglycemia: Secondary | ICD-10-CM | POA: Diagnosis not present

## 2022-07-04 DIAGNOSIS — Z992 Dependence on renal dialysis: Secondary | ICD-10-CM | POA: Insufficient documentation

## 2022-07-04 DIAGNOSIS — F17210 Nicotine dependence, cigarettes, uncomplicated: Secondary | ICD-10-CM | POA: Insufficient documentation

## 2022-07-04 DIAGNOSIS — N186 End stage renal disease: Secondary | ICD-10-CM | POA: Insufficient documentation

## 2022-07-04 DIAGNOSIS — I120 Hypertensive chronic kidney disease with stage 5 chronic kidney disease or end stage renal disease: Secondary | ICD-10-CM | POA: Diagnosis present

## 2022-07-04 DIAGNOSIS — N184 Chronic kidney disease, stage 4 (severe): Secondary | ICD-10-CM

## 2022-07-04 DIAGNOSIS — Z794 Long term (current) use of insulin: Secondary | ICD-10-CM | POA: Insufficient documentation

## 2022-07-04 HISTORY — PX: DIALYSIS/PERMA CATHETER INSERTION: CATH118288

## 2022-07-04 LAB — GLUCOSE, CAPILLARY: Glucose-Capillary: 92 mg/dL (ref 70–99)

## 2022-07-04 LAB — POTASSIUM (ARMC VASCULAR LAB ONLY): Potassium (ARMC vascular lab): 5 mmol/L (ref 3.5–5.1)

## 2022-07-04 SURGERY — DIALYSIS/PERMA CATHETER INSERTION
Anesthesia: Moderate Sedation

## 2022-07-04 MED ORDER — FENTANYL CITRATE PF 50 MCG/ML IJ SOSY
PREFILLED_SYRINGE | INTRAMUSCULAR | Status: AC
  Filled 2022-07-04: qty 2

## 2022-07-04 MED ORDER — SODIUM CHLORIDE 0.9 % IV SOLN: INTRAVENOUS | Status: DC

## 2022-07-04 MED ORDER — DIPHENHYDRAMINE HCL 50 MG/ML IJ SOLN: 50.0000 mg | Freq: Once | INTRAMUSCULAR | Status: DC | PRN

## 2022-07-04 MED ORDER — ONDANSETRON HCL 4 MG/2ML IJ SOLN: 4.0000 mg | Freq: Four times a day (QID) | INTRAMUSCULAR | Status: DC | PRN

## 2022-07-04 MED ORDER — CEFAZOLIN SODIUM-DEXTROSE 1-4 GM/50ML-% IV SOLN
INTRAVENOUS | Status: AC
  Administered 2022-07-04: 1 g via INTRAVENOUS
  Filled 2022-07-04: qty 50

## 2022-07-04 MED ORDER — MIDAZOLAM HCL 2 MG/2ML IJ SOLN
INTRAMUSCULAR | Status: AC
  Filled 2022-07-04: qty 4

## 2022-07-04 MED ORDER — FAMOTIDINE 20 MG PO TABS: 40.0000 mg | ORAL_TABLET | Freq: Once | ORAL | Status: DC | PRN

## 2022-07-04 MED ORDER — FENTANYL CITRATE (PF) 100 MCG/2ML IJ SOLN
INTRAMUSCULAR | Status: DC | PRN
  Administered 2022-07-04: 50 ug via INTRAVENOUS

## 2022-07-04 MED ORDER — HYDROMORPHONE HCL 1 MG/ML IJ SOLN: 1.0000 mg | Freq: Once | INTRAMUSCULAR | Status: DC | PRN

## 2022-07-04 MED ORDER — MIDAZOLAM HCL 2 MG/ML PO SYRP: 8.0000 mg | ORAL_SOLUTION | Freq: Once | ORAL | Status: DC | PRN

## 2022-07-04 MED ORDER — METHYLPREDNISOLONE SODIUM SUCC 125 MG IJ SOLR: 125.0000 mg | Freq: Once | INTRAMUSCULAR | Status: DC | PRN

## 2022-07-04 MED ORDER — MIDAZOLAM HCL 2 MG/2ML IJ SOLN
INTRAMUSCULAR | Status: DC | PRN
  Administered 2022-07-04: 1 mg via INTRAVENOUS

## 2022-07-04 MED ORDER — CEFAZOLIN SODIUM-DEXTROSE 1-4 GM/50ML-% IV SOLN: 1.0000 g | INTRAVENOUS | Status: AC

## 2022-07-04 SURGICAL SUPPLY — 8 items
BIOPATCH RED 1 DISK 7.0 (GAUZE/BANDAGES/DRESSINGS) ×1 IMPLANT
CATH CANNON HEMO 15FR 19 (HEMODIALYSIS SUPPLIES) ×1 IMPLANT
COVER PROBE U/S 5X48 (MISCELLANEOUS) ×1 IMPLANT
DERMABOND ADVANCED (GAUZE/BANDAGES/DRESSINGS) ×1
DERMABOND ADVANCED .7 DNX12 (GAUZE/BANDAGES/DRESSINGS) IMPLANT
PACK ANGIOGRAPHY (CUSTOM PROCEDURE TRAY) ×1 IMPLANT
SUT MNCRL AB 4-0 PS2 18 (SUTURE) ×1 IMPLANT
SUT PROLENE 0 CT 1 30 (SUTURE) ×1 IMPLANT

## 2022-07-04 NOTE — Interval H&P Note (Signed)
History and Physical Interval Note:  07/04/2022 11:00 AM  [name]  has presented today for surgery, with the diagnosis of Perm Cath Insertion    Chronic kidney disease Stage 4.  The various methods of treatment have been discussed with the patient and family. After consideration of risks, benefits and other options for treatment, the patient has consented to  Procedure(s): DIALYSIS/PERMA CATHETER INSERTION (N/A) as a surgical intervention.  The patient's history has been reviewed, patient examined, no change in status, stable for surgery.  I have reviewed the patient's chart and labs.  Questions were answered to the patient's satisfaction.     [name]

## 2022-07-04 NOTE — Progress Notes (Signed)
Pt eating and drinking

## 2022-07-04 NOTE — Progress Notes (Signed)
Call to DDS guardian, [name], for pt update and status disposition, pt leaving with Jan, caregiver, [place] #[address]

## 2022-07-04 NOTE — Op Note (Signed)
OPERATIVE NOTE    PRE-OPERATIVE DIAGNOSIS: 1. ESRD   POST-OPERATIVE DIAGNOSIS: same as above  PROCEDURE: Ultrasound guidance for vascular access to the right internal jugular vein Fluoroscopic guidance for placement of catheter Placement of a 19 cm tip to cuff tunneled hemodialysis catheter via the right internal jugular vein  SURGEON: [name], MD  ANESTHESIA:  Local with Moderate conscious sedation for approximately 23 minutes using 1 mg of Versed and 50 mcg of Fentanyl  ESTIMATED BLOOD LOSS: 10 cc  FLUORO TIME: less than one minute  CONTRAST: none  FINDING(S): 1.  Patent right internal jugular vein  SPECIMEN(S):  None  INDICATIONS:   [name] is a 76 y.o.male who presents with renal failure.  The patient needs long term dialysis access for their ESRD, and a Permcath is necessary.  Risks and benefits are discussed and informed consent is obtained.    DESCRIPTION: After obtaining full informed written consent, the patient was brought back to the vascular suited. The patient's right neck and chest were sterilely prepped and draped in a sterile surgical field was created. Moderate conscious sedation was administered during a face to face encounter with the patient throughout the procedure with my supervision of the RN administering medicines and monitoring the patient's vital signs, pulse oximetry, telemetry and mental status throughout from the start of the procedure until the patient was taken to the recovery room.  The right internal jugular vein was visualized with ultrasound and found to be patent. It was then accessed under direct ultrasound guidance and a permanent image was recorded. A wire was placed. After skin nick and dilatation, the peel-away sheath was placed over the wire. I then turned my attention to an area under the clavicle. Approximately 1-2 fingerbreadths below the clavicle a small counterincision was created and tunneled from the subclavicular  incision to the access site. Using fluoroscopic guidance, a 19 centimeter tip to cuff tunneled hemodialysis catheter was selected, and tunneled from the subclavicular incision to the access site. It was then placed through the peel-away sheath and the peel-away sheath was removed. Using fluoroscopic guidance the catheter tips were parked in the right atrium. The appropriate distal connectors were placed. It withdrew blood well and flushed easily with heparinized saline and a concentrated heparin solution was then placed. It was secured to the chest wall with 2 Prolene sutures. The access incision was closed single 4-0 Monocryl. A 4-0 Monocryl pursestring suture was placed around the exit site. Sterile dressings were placed. The patient tolerated the procedure well and was taken to the recovery room in stable condition.  COMPLICATIONS: None  CONDITION: Stable  [name], MD 07/04/2022 1:15 PM   This note was created with Dragon Medical transcription system. Any errors in dictation are purely unintentional.

## 2022-07-05 ENCOUNTER — Encounter: Payer: Self-pay | Admitting: Vascular Surgery

## 2022-07-07 ENCOUNTER — Other Ambulatory Visit: Payer: Self-pay | Admitting: Nephrology

## 2022-07-07 DIAGNOSIS — N185 Chronic kidney disease, stage 5: Secondary | ICD-10-CM

## 2022-07-07 DIAGNOSIS — R7612 Nonspecific reaction to cell mediated immunity measurement of gamma interferon antigen response without active tuberculosis: Secondary | ICD-10-CM

## 2022-07-07 NOTE — Progress Notes (Unsigned)
Cxr ordered for + Quantiferon    Quantiferon(R)-TB Gold Plus, 1 Tube NEGATIVE POSITIVE Abnormal   Comment:                                         In healthy persons who have a low                                          likelihood of M. tuberculosis                                          infection, a single positive QFT                                          result should not be taken as                                          reliable evidence of M. tuberculosis                                          infection. Repeat testing, with                                          either the initial test or a                                          different test, may be considered on                                          a case-by-case basis.  Nil IU/mL 0.04  MITOGEN-NIL IU/mL 2.26  TB1-NIL IU/mL 6.86  TB2-NIL IU/mL 7.06

## 2022-07-14 ENCOUNTER — Ambulatory Visit
Admission: RE | Admit: 2022-07-14 | Discharge: 2022-07-14 | Disposition: A | Discharge status: Home / Self Care | Payer: Medicare Other | Source: Ambulatory Visit | Attending: Nephrology | Admitting: Nephrology

## 2022-07-14 ENCOUNTER — Ambulatory Visit
Admission: RE | Admit: 2022-07-14 | Discharge: 2022-07-14 | Disposition: A | Discharge status: Home / Self Care | Payer: Medicare Other | Attending: Nephrology | Admitting: Nephrology

## 2022-07-14 DIAGNOSIS — N185 Chronic kidney disease, stage 5: Secondary | ICD-10-CM

## 2022-07-14 DIAGNOSIS — R7612 Nonspecific reaction to cell mediated immunity measurement of gamma interferon antigen response without active tuberculosis: Secondary | ICD-10-CM

## 2022-08-03 ENCOUNTER — Telehealth (INDEPENDENT_AMBULATORY_CARE_PROVIDER_SITE_OTHER): Payer: Self-pay

## 2022-08-03 NOTE — Telephone Encounter (Signed)
Spoke with patient's caregiver Thayer Headings and the patient is scheduled on 08/17/22 with Dr. Delana Meyer for a left brachial axillary graft at the [facility]. Pre-op phone call is on 08/09/22 between 1-5 pm. Pre-surgical instructions were discussed and will be mailed to [facility] per the request.

## 2022-08-09 ENCOUNTER — Other Ambulatory Visit (INDEPENDENT_AMBULATORY_CARE_PROVIDER_SITE_OTHER): Payer: Self-pay | Admitting: Nurse Practitioner

## 2022-08-09 ENCOUNTER — Encounter
Admission: RE | Admit: 2022-08-09 | Discharge: 2022-08-09 | Disposition: A | Discharge status: Home / Self Care | Payer: Medicare Other | Source: Ambulatory Visit | Attending: Vascular Surgery | Admitting: Vascular Surgery

## 2022-08-09 ENCOUNTER — Other Ambulatory Visit: Payer: Medicare Other

## 2022-08-09 VITALS — Ht 68.0 in | Wt 172.0 lb

## 2022-08-09 DIAGNOSIS — Z01812 Encounter for preprocedural laboratory examination: Secondary | ICD-10-CM

## 2022-08-09 DIAGNOSIS — E119 Type 2 diabetes mellitus without complications: Secondary | ICD-10-CM

## 2022-08-09 DIAGNOSIS — N184 Chronic kidney disease, stage 4 (severe): Secondary | ICD-10-CM

## 2022-08-09 HISTORY — DX: Hyperkalemia: E87.5

## 2022-08-09 HISTORY — DX: Benign prostatic hyperplasia without lower urinary tract symptoms: N40.0

## 2022-08-09 HISTORY — DX: Chronic kidney disease, stage 4 (severe): N18.4

## 2022-08-09 HISTORY — DX: Type 2 diabetes mellitus with diabetic chronic kidney disease: E11.22

## 2022-08-09 NOTE — Patient Instructions (Addendum)
Your procedure is scheduled on: Wednesday, September 20 Report to the Registration Desk on the 1st floor of the [facility]. To find out your arrival time, please call [phone] between 1PM - 3PM on: Tuesday, September 19 If your arrival time is 6:00 am, do not arrive prior to that time as the [facility] entrance doors do not open until 6:00 am.  REMEMBER: Instructions that are not followed completely may result in serious medical risk, up to and including death; or upon the discretion of your surgeon and anesthesiologist your surgery may need to be rescheduled.  Do not eat or drink after midnight the night before surgery.  No gum chewing, lozengers or hard candies.  TAKE THESE MEDICATIONS THE MORNING OF SURGERY WITH A SIP OF WATER:  Amlodipine Atorvastatin Clonidine Doxazosin Finasteride Gabapentin  Lantus Insulin - only take 1/2 dose the night before surgery (Tuesday, September 19) only take 5 units.  One week prior to surgery: starting September 13 Stop Anti-inflammatories (NSAIDS) such as Advil, Aleve, Ibuprofen, Motrin, Naproxen, Naprosyn and Aspirin based products such as Excedrin, Goodys Powder, BC Powder. Stop ANY OVER THE COUNTER supplements until after surgery.  You may however, continue to take Tylenol if needed for pain up until the day of surgery.  No Alcohol for 24 hours before or after surgery.  No Smoking including e-cigarettes for 24 hours prior to surgery.  No chewable tobacco products for at least 6 hours prior to surgery.  No nicotine patches on the day of surgery.  Do not use any "recreational" drugs for at least a week prior to your surgery.  Please be advised that the combination of cocaine and anesthesia may have negative outcomes, up to and including death. If you test positive for cocaine, your surgery will be cancelled.  On the morning of surgery brush your teeth with toothpaste and water, you may rinse your mouth with mouthwash if you wish. Do  not swallow any toothpaste or mouthwash.  Use CHG wipes as directed on instruction sheet.  Do not wear jewelry, make-up, hairpins, clips or nail polish.  Do not wear lotions, powders, or perfumes.   Do not shave body from the neck down 48 hours prior to surgery just in case you cut yourself which could leave a site for infection.  Also, freshly shaved skin may become irritated if using the CHG soap.  Contact lenses, hearing aids and dentures may not be worn into surgery.  Do not bring valuables to the hospital. [facility] is not responsible for any missing/lost belongings or valuables.   Notify your doctor if there is any change in your medical condition (cold, fever, infection).  Wear comfortable clothing (specific to your surgery type) to the hospital.  After surgery, you can help prevent lung complications by doing breathing exercises.  Take deep breaths and cough every 1-2 hours. Your doctor may order a device called an Incentive Spirometer to help you take deep breaths.  If you are being discharged the day of surgery, you will not be allowed to drive home. You will need a responsible adult (18 years or older) to drive you home and stay with you that night.   If you are taking public transportation, you will need to have a responsible adult (18 years or older) with you. Please confirm with your physician that it is acceptable to use public transportation.   Please call the [facility] at [phone] if you have any questions about these instructions.  Surgery  Visitation Policy:  Patients undergoing a surgery or procedure may have two family members or support persons with them as long as the person is not COVID-19 positive or experiencing its symptoms.   Preparing the Skin Before Surgery     To help prevent the risk of infection at your surgical site, we are now providing you with rinse-free Sage 2% Chlorhexidine Gluconate (CHG) disposable wipes.  The  night before surgery: Shower or bathe with warm water. Do not apply perfume, lotions, powders. Wait one hour after shower. Skin should be dry and cool. Open Sage wipe package - 6 disposable cloths are inside. Wipe body using one cloth for the right arm, one cloth for the left arm, one cloth for the right leg, one cloth for the left leg, one cloth for the chest/abdomen area (do not use on breasts if breast feeding), and one cloth for the back. 5. Do not rinse, allow to dry. 6. Skin may fee "tacky" for several minutes. 7. Dress in clean clothes. 8. Place clean sheets on your bed and do not sleep with pets.  REPEAT ABOVE ON THE MORNING OF SURGERY PRIOR TO ARRIVING TO [facility].

## 2022-08-11 ENCOUNTER — Encounter
Admission: RE | Admit: 2022-08-11 | Discharge: 2022-08-11 | Disposition: A | Discharge status: Home / Self Care | Payer: Medicare Other | Source: Ambulatory Visit | Attending: Vascular Surgery | Admitting: Vascular Surgery

## 2022-08-11 ENCOUNTER — Encounter: Payer: Self-pay | Admitting: Urgent Care

## 2022-08-11 DIAGNOSIS — Z01818 Encounter for other preprocedural examination: Secondary | ICD-10-CM | POA: Insufficient documentation

## 2022-08-11 DIAGNOSIS — N184 Chronic kidney disease, stage 4 (severe): Secondary | ICD-10-CM | POA: Diagnosis not present

## 2022-08-11 LAB — CBC WITH DIFFERENTIAL/PLATELET
Abs Immature Granulocytes: 0.01 10*3/uL (ref 0.00–0.07)
Basophils Absolute: 0 10*3/uL (ref 0.0–0.1)
Basophils Relative: 0 %
Eosinophils Absolute: 0.1 10*3/uL (ref 0.0–0.5)
Eosinophils Relative: 2 %
HCT: 31.4 % — ABNORMAL LOW (ref 39.0–52.0)
Hemoglobin: 9.8 g/dL — ABNORMAL LOW (ref 13.0–17.0)
Immature Granulocytes: 0 %
Lymphocytes Relative: 26 %
Lymphs Abs: 1.3 10*3/uL (ref 0.7–4.0)
MCH: 30.1 pg (ref 26.0–34.0)
MCHC: 31.2 g/dL (ref 30.0–36.0)
MCV: 96.3 fL (ref 80.0–100.0)
Monocytes Absolute: 0.5 10*3/uL (ref 0.1–1.0)
Monocytes Relative: 10 %
Neutro Abs: 3 10*3/uL (ref 1.7–7.7)
Neutrophils Relative %: 62 %
Platelets: 249 10*3/uL (ref 150–400)
RBC: 3.26 MIL/uL — ABNORMAL LOW (ref 4.22–5.81)
RDW: 15.2 % (ref 11.5–15.5)
WBC: 4.9 10*3/uL (ref 4.0–10.5)
nRBC: 0 % (ref 0.0–0.2)

## 2022-08-11 LAB — BASIC METABOLIC PANEL
Anion gap: 12 (ref 5–15)
BUN: 25 mg/dL — ABNORMAL HIGH (ref 8–23)
CO2: 28 mmol/L (ref 22–32)
Calcium: 8.4 mg/dL — ABNORMAL LOW (ref 8.9–10.3)
Chloride: 100 mmol/L (ref 98–111)
Creatinine, Ser: 4.92 mg/dL — ABNORMAL HIGH (ref 0.61–1.24)
GFR, Estimated: 12 mL/min — ABNORMAL LOW (ref >60)
Glucose, Bld: 159 mg/dL — ABNORMAL HIGH (ref 70–99)
Potassium: 4.5 mmol/L (ref 3.5–5.1)
Sodium: 140 mmol/L (ref 135–145)

## 2022-08-17 ENCOUNTER — Ambulatory Visit: Payer: Medicare Other | Admitting: Anesthesiology

## 2022-08-17 ENCOUNTER — Other Ambulatory Visit: Payer: Self-pay

## 2022-08-17 ENCOUNTER — Encounter
Admission: RE | Disposition: A | Discharge status: Home / Self Care | Payer: Self-pay | Source: Home / Self Care | Attending: Vascular Surgery

## 2022-08-17 ENCOUNTER — Ambulatory Visit
Admission: RE | Admit: 2022-08-17 | Discharge: 2022-08-17 | Disposition: A | Discharge status: Home / Self Care | Payer: Medicare Other | Attending: Vascular Surgery | Admitting: Vascular Surgery

## 2022-08-17 DIAGNOSIS — I770 Arteriovenous fistula, acquired: Secondary | ICD-10-CM | POA: Diagnosis present

## 2022-08-17 DIAGNOSIS — Z992 Dependence on renal dialysis: Secondary | ICD-10-CM | POA: Diagnosis not present

## 2022-08-17 DIAGNOSIS — N184 Chronic kidney disease, stage 4 (severe): Secondary | ICD-10-CM

## 2022-08-17 DIAGNOSIS — E119 Type 2 diabetes mellitus without complications: Secondary | ICD-10-CM

## 2022-08-17 DIAGNOSIS — E1122 Type 2 diabetes mellitus with diabetic chronic kidney disease: Secondary | ICD-10-CM | POA: Diagnosis not present

## 2022-08-17 DIAGNOSIS — N186 End stage renal disease: Secondary | ICD-10-CM | POA: Diagnosis not present

## 2022-08-17 DIAGNOSIS — Z01812 Encounter for preprocedural laboratory examination: Secondary | ICD-10-CM

## 2022-08-17 DIAGNOSIS — F17210 Nicotine dependence, cigarettes, uncomplicated: Secondary | ICD-10-CM | POA: Insufficient documentation

## 2022-08-17 DIAGNOSIS — Z419 Encounter for procedure for purposes other than remedying health state, unspecified: Secondary | ICD-10-CM

## 2022-08-17 DIAGNOSIS — I120 Hypertensive chronic kidney disease with stage 5 chronic kidney disease or end stage renal disease: Secondary | ICD-10-CM | POA: Diagnosis not present

## 2022-08-17 HISTORY — PX: AV FISTULA PLACEMENT: SHX1204

## 2022-08-17 LAB — POCT I-STAT, CHEM 8
BUN: 21 mg/dL (ref 8–23)
Calcium, Ion: 1.01 mmol/L — ABNORMAL LOW (ref 1.15–1.40)
Chloride: 104 mmol/L (ref 98–111)
Creatinine, Ser: 5.5 mg/dL — ABNORMAL HIGH (ref 0.61–1.24)
Glucose, Bld: 78 mg/dL (ref 70–99)
HCT: 37 % — ABNORMAL LOW (ref 39.0–52.0)
Hemoglobin: 12.6 g/dL — ABNORMAL LOW (ref 13.0–17.0)
Potassium: 5.3 mmol/L — ABNORMAL HIGH (ref 3.5–5.1)
Sodium: 137 mmol/L (ref 135–145)
TCO2: 25 mmol/L (ref 22–32)

## 2022-08-17 LAB — GLUCOSE, CAPILLARY: Glucose-Capillary: 101 mg/dL — ABNORMAL HIGH (ref 70–99)

## 2022-08-17 SURGERY — INSERTION OF ARTERIOVENOUS (AV) GORE-TEX GRAFT ARM
Anesthesia: General | Laterality: Left

## 2022-08-17 MED ORDER — FENTANYL CITRATE (PF) 100 MCG/2ML IJ SOLN
INTRAMUSCULAR | Status: AC
  Filled 2022-08-17: qty 2

## 2022-08-17 MED ORDER — HEPARIN SODIUM (PORCINE) 1000 UNIT/ML IJ SOLN
INTRAMUSCULAR | Status: DC | PRN
  Administered 2022-08-17: 3000 [IU] via INTRAVENOUS

## 2022-08-17 MED ORDER — PROPOFOL 1000 MG/100ML IV EMUL
INTRAVENOUS | Status: AC
  Filled 2022-08-17: qty 100

## 2022-08-17 MED ORDER — DEXAMETHASONE SODIUM PHOSPHATE 10 MG/ML IJ SOLN
INTRAMUSCULAR | Status: DC | PRN
  Administered 2022-08-17: 5 mg via INTRAVENOUS

## 2022-08-17 MED ORDER — FAMOTIDINE 20 MG PO TABS
ORAL_TABLET | ORAL | Status: AC
  Administered 2022-08-17: 20 mg via ORAL
  Filled 2022-08-17: qty 1

## 2022-08-17 MED ORDER — ACETAMINOPHEN 325 MG PO TABS: 325.0000 mg | ORAL_TABLET | ORAL | Status: DC | PRN

## 2022-08-17 MED ORDER — PROPOFOL 10 MG/ML IV BOLUS
INTRAVENOUS | Status: DC | PRN
  Administered 2022-08-17: 50 mg via INTRAVENOUS
  Administered 2022-08-17: 100 mg via INTRAVENOUS

## 2022-08-17 MED ORDER — ACETAMINOPHEN 160 MG/5ML PO SOLN: 325.0000 mg | ORAL | Status: DC | PRN

## 2022-08-17 MED ORDER — ROCURONIUM BROMIDE 100 MG/10ML IV SOLN
INTRAVENOUS | Status: DC | PRN
  Administered 2022-08-17: 50 mg via INTRAVENOUS
  Administered 2022-08-17 (×2): 10 mg via INTRAVENOUS

## 2022-08-17 MED ORDER — HYDROMORPHONE HCL 1 MG/ML IJ SOLN
INTRAMUSCULAR | Status: AC
  Filled 2022-08-17: qty 1

## 2022-08-17 MED ORDER — HEPARIN SODIUM (PORCINE) 5000 UNIT/ML IJ SOLN
INTRAMUSCULAR | Status: AC
  Filled 2022-08-17: qty 1

## 2022-08-17 MED ORDER — OXYCODONE HCL 5 MG PO TABS: 5.0000 mg | ORAL_TABLET | Freq: Once | ORAL | Status: DC | PRN

## 2022-08-17 MED ORDER — CHLORHEXIDINE GLUCONATE 0.12 % MT SOLN
OROMUCOSAL | Status: AC
  Administered 2022-08-17: 15 mL via OROMUCOSAL
  Filled 2022-08-17: qty 15

## 2022-08-17 MED ORDER — EPHEDRINE SULFATE (PRESSORS) 50 MG/ML IJ SOLN
INTRAMUSCULAR | Status: DC | PRN
  Administered 2022-08-17: 5 mg via INTRAVENOUS

## 2022-08-17 MED ORDER — BUPIVACAINE-EPINEPHRINE (PF) 0.5% -1:200000 IJ SOLN
INTRAMUSCULAR | Status: AC
  Filled 2022-08-17: qty 30

## 2022-08-17 MED ORDER — FENTANYL CITRATE (PF) 100 MCG/2ML IJ SOLN: 25.0000 ug | INTRAMUSCULAR | Status: DC | PRN

## 2022-08-17 MED ORDER — PHENYLEPHRINE HCL (PRESSORS) 10 MG/ML IV SOLN
INTRAVENOUS | Status: AC
  Filled 2022-08-17: qty 1

## 2022-08-17 MED ORDER — PHENYLEPHRINE HCL-NACL 20-0.9 MG/250ML-% IV SOLN
INTRAVENOUS | Status: DC | PRN
  Administered 2022-08-17: 25 ug/min via INTRAVENOUS

## 2022-08-17 MED ORDER — PHENYLEPHRINE 80 MCG/ML (10ML) SYRINGE FOR IV PUSH (FOR BLOOD PRESSURE SUPPORT)
PREFILLED_SYRINGE | INTRAVENOUS | Status: DC | PRN
  Administered 2022-08-17 (×3): 160 ug via INTRAVENOUS
  Administered 2022-08-17: 80 ug via INTRAVENOUS
  Administered 2022-08-17: 160 ug via INTRAVENOUS

## 2022-08-17 MED ORDER — SODIUM CHLORIDE 0.9 % IV SOLN
INTRAVENOUS | Status: DC | PRN
  Administered 2022-08-17: 501 mL

## 2022-08-17 MED ORDER — FENTANYL CITRATE (PF) 100 MCG/2ML IJ SOLN
INTRAMUSCULAR | Status: DC | PRN
  Administered 2022-08-17 (×2): 25 ug via INTRAVENOUS

## 2022-08-17 MED ORDER — GLYCOPYRROLATE 0.2 MG/ML IJ SOLN
INTRAMUSCULAR | Status: DC | PRN
  Administered 2022-08-17: .2 mg via INTRAVENOUS

## 2022-08-17 MED ORDER — LIDOCAINE HCL (CARDIAC) PF 100 MG/5ML IV SOSY
PREFILLED_SYRINGE | INTRAVENOUS | Status: DC | PRN
  Administered 2022-08-17: 60 mg via INTRAVENOUS

## 2022-08-17 MED ORDER — ACETAMINOPHEN 10 MG/ML IV SOLN
INTRAVENOUS | Status: AC
  Filled 2022-08-17: qty 100

## 2022-08-17 MED ORDER — HYDROCODONE-ACETAMINOPHEN 5-325 MG PO TABS: 2.0000 | ORAL_TABLET | Freq: Four times a day (QID) | ORAL | 0 refills | Status: DC | PRN

## 2022-08-17 MED ORDER — ONDANSETRON HCL 4 MG/2ML IJ SOLN
INTRAMUSCULAR | Status: DC | PRN
  Administered 2022-08-17: 4 mg via INTRAVENOUS

## 2022-08-17 MED ORDER — CHLORHEXIDINE GLUCONATE CLOTH 2 % EX PADS: 6.0000 | MEDICATED_PAD | Freq: Once | CUTANEOUS | Status: DC

## 2022-08-17 MED ORDER — CEFAZOLIN SODIUM-DEXTROSE 2-4 GM/100ML-% IV SOLN
INTRAVENOUS | Status: AC
  Filled 2022-08-17: qty 100

## 2022-08-17 MED ORDER — ACETAMINOPHEN 10 MG/ML IV SOLN
INTRAVENOUS | Status: DC | PRN
  Administered 2022-08-17: 1000 mg via INTRAVENOUS

## 2022-08-17 MED ORDER — CHLORHEXIDINE GLUCONATE CLOTH 2 % EX PADS
6.0000 | MEDICATED_PAD | Freq: Once | CUTANEOUS | Status: AC
  Administered 2022-08-17: 6 via TOPICAL

## 2022-08-17 MED ORDER — CEFAZOLIN SODIUM-DEXTROSE 2-4 GM/100ML-% IV SOLN
2.0000 g | INTRAVENOUS | Status: AC
  Administered 2022-08-17: 2 g via INTRAVENOUS

## 2022-08-17 MED ORDER — ORAL CARE MOUTH RINSE: 15.0000 mL | Freq: Once | OROMUCOSAL | Status: AC

## 2022-08-17 MED ORDER — FAMOTIDINE 20 MG PO TABS: 20.0000 mg | ORAL_TABLET | Freq: Once | ORAL | Status: AC

## 2022-08-17 MED ORDER — OXYCODONE HCL 5 MG/5ML PO SOLN: 5.0000 mg | Freq: Once | ORAL | Status: DC | PRN

## 2022-08-17 MED ORDER — SODIUM CHLORIDE 0.9 % IV SOLN: INTRAVENOUS | Status: DC

## 2022-08-17 MED ORDER — CHLORHEXIDINE GLUCONATE 0.12 % MT SOLN: 15.0000 mL | Freq: Once | OROMUCOSAL | Status: AC

## 2022-08-17 MED ORDER — SUGAMMADEX SODIUM 200 MG/2ML IV SOLN
INTRAVENOUS | Status: DC | PRN
  Administered 2022-08-17: 200 mg via INTRAVENOUS

## 2022-08-17 SURGICAL SUPPLY — 48 items
BAG DECANTER FOR FLEXI CONT (MISCELLANEOUS) ×1 IMPLANT
BLADE SURG SZ11 CARB STEEL (BLADE) ×1 IMPLANT
BOOT SUTURE AID YELLOW STND (SUTURE) ×1 IMPLANT
BRUSH SCRUB EZ  4% CHG (MISCELLANEOUS) ×1
BRUSH SCRUB EZ 4% CHG (MISCELLANEOUS) ×1 IMPLANT
CHLORAPREP W/TINT 26 (MISCELLANEOUS) ×1 IMPLANT
CLIP SPRNG 6 S-JAW DBL (CLIP) ×1 IMPLANT
CLIP SPRNG 6MM S-JAW DBL (CLIP) ×1
DERMABOND ADVANCED .7 DNX12 (GAUZE/BANDAGES/DRESSINGS) ×1 IMPLANT
ELECT CAUTERY BLADE 6.4 (BLADE) ×1 IMPLANT
ELECT REM PT RETURN 9FT ADLT (ELECTROSURGICAL) ×1
ELECTRODE REM PT RTRN 9FT ADLT (ELECTROSURGICAL) ×1 IMPLANT
GLOVE BIO SURGEON STRL SZ7 (GLOVE) ×1 IMPLANT
GOWN STRL REUS W/ TWL LRG LVL3 (GOWN DISPOSABLE) ×1 IMPLANT
GOWN STRL REUS W/ TWL XL LVL3 (GOWN DISPOSABLE) ×1 IMPLANT
GOWN STRL REUS W/TWL LRG LVL3 (GOWN DISPOSABLE) ×1
GOWN STRL REUS W/TWL XL LVL3 (GOWN DISPOSABLE) ×1
GRAFT PROPATEN STD WALL 6X40 (Vascular Products) IMPLANT
HEMOSTAT SURGICEL 2X3 (HEMOSTASIS) ×1 IMPLANT
IV NS 500ML (IV SOLUTION) ×1
IV NS 500ML BAXH (IV SOLUTION) ×1 IMPLANT
KIT TURNOVER KIT A (KITS) ×1 IMPLANT
LABEL OR SOLS (LABEL) ×1 IMPLANT
MANIFOLD NEPTUNE II (INSTRUMENTS) ×1 IMPLANT
NDL FILTER BLUNT 18X1 1/2 (NEEDLE) ×1 IMPLANT
NEEDLE FILTER BLUNT 18X1 1/2 (NEEDLE) ×1 IMPLANT
PACK EXTREMITY ARMC (MISCELLANEOUS) ×1 IMPLANT
PAD PREP 24X41 OB/GYN DISP (PERSONAL CARE ITEMS) ×1 IMPLANT
SOLUTION CELL SAVER (CLIP) ×1 IMPLANT
SPIKE FLUID TRANSFER (MISCELLANEOUS) ×1 IMPLANT
STOCKINETTE 48X4 2 PLY STRL (GAUZE/BANDAGES/DRESSINGS) ×1 IMPLANT
STOCKINETTE STRL 4IN 9604848 (GAUZE/BANDAGES/DRESSINGS) ×1 IMPLANT
SUT GORETEX 6.0 TT9 (SUTURE) ×1 IMPLANT
SUT MNCRL AB 4-0 PS2 18 (SUTURE) ×1 IMPLANT
SUT PROLENE 6 0 BV (SUTURE) ×4 IMPLANT
SUT SILK 0 SH 30 (SUTURE) ×1 IMPLANT
SUT SILK 2 0 (SUTURE) ×1
SUT SILK 2-0 18XBRD TIE 12 (SUTURE) ×1 IMPLANT
SUT SILK 3 0 (SUTURE) ×1
SUT SILK 3-0 18XBRD TIE 12 (SUTURE) ×1 IMPLANT
SUT SILK 4 0 (SUTURE) ×1
SUT SILK 4-0 18XBRD TIE 12 (SUTURE) ×1 IMPLANT
SUT VIC AB 3-0 SH 27 (SUTURE) ×1
SUT VIC AB 3-0 SH 27X BRD (SUTURE) ×1 IMPLANT
SYR 20ML LL LF (SYRINGE) ×1 IMPLANT
SYR 3ML LL SCALE MARK (SYRINGE) ×1 IMPLANT
TRAP FLUID SMOKE EVACUATOR (MISCELLANEOUS) ×1 IMPLANT
WATER STERILE IRR 500ML POUR (IV SOLUTION) ×1 IMPLANT

## 2022-08-17 NOTE — H&P (Signed)
[name] Admission History & Physical  MRN : [id number]  [name] is a 76 y.o. ([birth year]) male who presents with chief complaint of No chief complaint on file. [name]  History of Present Illness: patient presents for left arm AVG placement today.  Permcath placed last month. Currently getting dialysis with the catheter. Right hand dominant.  Current Facility-Administered Medications  Medication Dose Route Frequency Provider Last Rate Last Admin   0.9 %  sodium chloride infusion   Intravenous Continuous Dimas Millin, MD       ceFAZolin (ANCEF) 2-4 GM/100ML-% IVPB            ceFAZolin (ANCEF) IVPB 2g/100 mL premix  2 g Intravenous On Call to Thiells, Painter, NP       Chlorhexidine Gluconate Cloth 2 % PADS 6 each  6 each Topical Once Kris Hartmann, NP        Past Medical History:  Diagnosis Date   Alcoholism [facility])    Allergy    Altered mental status 2011   hx of 2/2 hypercalcemia, EtOH w/d and medication overuse   Angina    mild pain on arm, left chest, moves down left chest   BPH (benign prostatic hyperplasia)    Chronic kidney disease, stage 4 (severe) (HCC)    Chronic renal insufficiency    with history of acute on chronic secondary to dehydration and hypercalcemia   Dementia (HCC)    Encephalopathy    Fx humeral neck 2008   right side, no history of surgical repair   History of nephrolithiasis 2008   s/p ureterolithotomy    Hypercalcemia    2/2 primary hyperparathyroidism   Hyperkalemia    Hypertension    Pancreatitis 2011   history of several admissions for pancreatitis    Parathyroid adenoma    Primary hyperparathyroidism ([place])    s/p parathyroid adenoma resection Oct. 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 12, 2011   PARATHYROIDECTOMY  03/09/2012   Procedure: PARATHYROIDECTOMY;  Surgeon: Pedro Earls, MD;  Location: [facility];  Service: General;  Laterality: N/A;  mediastinal paraqthyroidectomy    THYROIDECTOMY  03/09/2012     Social History   Tobacco Use   Smoking status: Every Day    Packs/day: 1.00    Years: 50.00    Total pack years: 50.00    Types: Cigarettes   Smokeless tobacco: Never  Substance Use Topics   Alcohol use: No    Comment: 03/09/12 "last alcohol 1983"; history of heavy alcohol use   Drug use: No    Types: Marijuana    Comment: 03/09/12 Last drug use "~1978"     Family History  Problem Relation Age  of Onset   Kidney cancer Neg Hx    Bladder Cancer Neg Hx    Prostate cancer Neg Hx     No Known Allergies   REVIEW OF SYSTEMS (Negative unless checked)  Constitutional: '[]'$ Weight loss  '[]'$ Fever  '[]'$ Chills Cardiac: '[]'$ Chest pain   '[]'$ Chest pressure   '[]'$ Palpitations   '[]'$ Shortness of breath when laying flat   '[]'$ Shortness of breath at rest   '[x]'$ Shortness of breath with exertion. Vascular:  '[]'$ Pain in legs with walking   '[]'$ Pain in legs at rest   '[]'$ Pain in legs when laying flat   '[]'$ Claudication   '[]'$ Pain in feet when walking  '[]'$ Pain in feet at rest  '[]'$ Pain in feet when laying flat   '[]'$ History of DVT   '[]'$ Phlebitis   '[]'$ Swelling in legs   '[]'$ Varicose veins   '[]'$ Non-healing ulcers Pulmonary:   '[]'$ Uses home oxygen   '[]'$ Productive cough   '[]'$ Hemoptysis    '[]'$ Wheeze  '[]'$ COPD   '[]'$ Asthma Neurologic:  '[x]'$ Dizziness  '[]'$ Blackouts   '[]'$ Seizures   '[]'$ History of stroke   '[]'$ History of TIA  '[]'$ Aphasia   '[]'$ Temporary blindness   '[]'$ Dysphagia   '[]'$ Weakness or numbness in arms   '[]'$ Weakness or numbness in legs Musculoskeletal:  '[]'$ Arthritis   '[]'$ Joint swelling   '[]'$ Joint pain   '[]'$ Low back pain Hematologic:  '[]'$ Easy bruising  '[]'$ Easy bleeding   '[]'$ Hypercoagulable state   '[x]'$ Anemic  '[]'$ Hepatitis Gastrointestinal:  '[]'$ Blood in stool   '[]'$ Vomiting blood  '[]'$ Gastroesophageal reflux/heartburn   '[]'$ Difficulty swallowing. Genitourinary:  '[x]'$ Chronic kidney disease   '[]'$ Difficult urination  '[]'$ Frequent urination  '[]'$ Burning with urination   '[]'$ Blood in urine Skin:  '[]'$ Rashes   '[]'$ Ulcers   '[]'$ Wounds Psychological:  '[]'$ History of anxiety   '[]'$  History of major depression.  Physical Examination  Vitals:   08/17/22 0644  BP: 93/67  Pulse: 79  Resp: 16  Temp: 97.9 F (36.6 C)  TempSrc: Temporal  SpO2: 100%  Weight: 78 kg  Height: '5\' 8"'$  (1.727 m)   Body mass index is 26.15 kg/m. Gen: WD/WN, NAD Head: [place]/AT, No temporalis wasting.  Ear/Nose/Throat: Hearing grossly intact, nares w/o erythema or drainage, oropharynx w/o Erythema/Exudate,  Eyes: Conjunctiva clear, sclera non-icteric Neck: Trachea midline.  No JVD.  Pulmonary:  Good air movement, respirations not labored, no use of accessory muscles.  Cardiac: RRR, normal S1, S2. Vascular: permcath in place in right chest Vessel Right Left  Radial Palpable Palpable                   Musculoskeletal: M/S 5/5 throughout.  Extremities without ischemic changes.  No deformity or atrophy.  Neurologic: Sensation grossly intact in extremities.  Symmetrical.  Speech is fluent. Motor exam as listed above. Dermatologic: No rashes or ulcers noted.  No cellulitis or open wounds.      CBC Lab Results  Component Value Date   WBC 4.9 08/11/2022   HGB 12.6 (L) 08/17/2022   HCT 37.0 (L) 08/17/2022   MCV 96.3 08/11/2022   PLT 249 08/11/2022     BMET    Component Value Date/Time   NA 137 08/17/2022 0650   NA 135 03/08/2017 1102   K 5.3 (H) 08/17/2022 0650   CL 104 08/17/2022 0650   CO2 28 08/11/2022 0908   GLUCOSE 78 08/17/2022 0650   BUN 21 08/17/2022 0650   BUN 45 (H) 03/08/2017 1102   CREATININE 5.50 (H) 08/17/2022 0650   CALCIUM 8.4 (L) 08/11/2022 0908   CALCIUM >15.0 (HH) 11/09/2011 1455   GFRNONAA 12 (L) 08/11/2022  0908   GFRAA 21 (L) 01/20/2019 8978   Estimated Creatinine Clearance: 11.2 mL/min (A) (by C-G formula based on SCr of 5.5 mg/dL (H)).  COAG Lab Results  Component Value Date   INR 1.15 01/04/2012   INR 1.03 11/09/2011   INR 0.91 08/15/2010    Radiology No results found.   Assessment/Plan 1. ESRD. For AVG placement today 2. DM. Cause of ESRD and blood glucose control important in reducing the progression of atherosclerotic disease. Also, involved in wound healing. On appropriate medications. 3. Hypertension. Cause of ESRD and blood pressure control important in reducing the progression of atherosclerotic disease. On appropriate oral medications.    [name], MD  08/17/2022 7:25 AM

## 2022-08-17 NOTE — Anesthesia Preprocedure Evaluation (Addendum)
Anesthesia Evaluation  Patient identified by MRN, date of birth, ID band Patient confused    Reviewed: Allergy & Precautions, NPO status , Patient's Chart, lab work & pertinent test results, Unable to perform ROS - Chart review only  History of Anesthesia Complications Negative for: history of anesthetic complications  Airway Mallampati: III  TM Distance: >3 FB Neck ROM: Full    Dental  (+) Edentulous Upper, Edentulous Lower   Pulmonary Current Smoker,    Pulmonary exam normal breath sounds clear to auscultation       Cardiovascular hypertension, Normal cardiovascular exam Rhythm:Regular Rate:Normal     Neuro/Psych PSYCHIATRIC DISORDERS Schizophrenia Dementia    GI/Hepatic (+)     substance abuse  ,   Endo/Other  diabetes  Renal/GU CRFRenal disease     Musculoskeletal   Abdominal   Peds  Hematology   Anesthesia Other Findings   Reproductive/Obstetrics                           Anesthesia Physical Anesthesia Plan  ASA: 3  Anesthesia Plan: General ETT   Post-op Pain Management: Toradol IV (intra-op)* and Dilaudid IV   Induction: Intravenous  PONV Risk Score and Plan: 2 and Dexamethasone, Ondansetron and Treatment may vary due to age or medical condition  Airway Management Planned: Oral ETT  Additional Equipment:   Intra-op Plan:   Post-operative Plan: Extubation in OR  Informed Consent: I have reviewed the patients History and Physical, chart, labs and discussed the procedure including the risks, benefits and alternatives for the proposed anesthesia with the patient or authorized representative who has indicated his/her understanding and acceptance.     Dental Advisory Given  Plan Discussed with: Anesthesiologist, CRNA and Surgeon  Anesthesia Plan Comments: (Patient consented for risks of anesthesia including but not limited to:  - adverse reactions to medications -  damage to eyes, teeth, lips or other oral mucosa - nerve damage due to positioning  - sore throat or hoarseness - Damage to heart, brain, nerves, lungs, other parts of body or loss of life  Patient voiced understanding.)        Anesthesia Quick Evaluation

## 2022-08-17 NOTE — Transfer of Care (Signed)
Immediate Anesthesia Transfer of Care Note  Patient: [name]  Procedure(s) Performed: INSERTION OF ARTERIOVENOUS (AV) GORE-TEX GRAFT ARM (BRACHIAL AXILLARY) (Left)  Patient Location: PACU  Anesthesia Type:General  Level of Consciousness: awake, drowsy and patient cooperative  Airway & Oxygen Therapy: Patient Spontanous Breathing and Patient connected to face mask oxygen  Post-op Assessment: Report given to RN and Post -op Vital signs reviewed and stable  Post vital signs: Reviewed and stable  Last Vitals:  Vitals Value Taken Time  BP    Temp    Pulse 93 08/17/22 1027  Resp 16 08/17/22 1027  SpO2 100 % 08/17/22 1027  Vitals shown include unvalidated device data.  Last Pain:  Vitals:   08/17/22 0644  TempSrc: Temporal  PainSc: 0-No pain         Complications: No notable events documented.

## 2022-08-17 NOTE — Discharge Instructions (Signed)
AMBULATORY SURGERY  ?DISCHARGE INSTRUCTIONS ? ? ?The drugs that you were given will stay in your system until tomorrow so for the next 24 hours you should not: ? ?Drive an automobile ?Make any legal decisions ?Drink any alcoholic beverage ? ? ?You may resume regular meals tomorrow.  Today it is better to start with liquids and gradually work up to solid foods. ? ?You may eat anything you prefer, but it is better to start with liquids, then soup and crackers, and gradually work up to solid foods. ? ? ?Please notify your doctor immediately if you have any unusual bleeding, trouble breathing, redness and pain at the surgery site, drainage, fever, or pain not relieved by medication. ? ? ? ?Additional Instructions: ? ? ? ?Please contact your physician with any problems or Same Day Surgery at [phone], Monday through Friday 6 am to 4 pm, or [facility] [place] Main number at [phone].  ?

## 2022-08-17 NOTE — Anesthesia Procedure Notes (Signed)

## 2022-08-17 NOTE — Op Note (Signed)
[facility]  OPERATIVE NOTE   PROCEDURE:  Left upper arm brachial artery to axillary vein arteriovenous graft with 6 mm pTFE graft  PRE-OPERATIVE DIAGNOSIS: 1. end stage renal disease    POST-OPERATIVE DIAGNOSIS: same  SURGEON: Kyren Vaux  ASSISTANT(S): none  ANESTHESIA: general  ESTIMATED BLOOD LOSS: 25 cc  FINDING(S): none  SPECIMEN(S):  None  INDICATIONS:   [name] is a 76 y.o. male who presents with end stage renal disease and needs dialysis access.  Risk, benefits, and alternatives to access surgery were discussed.  The patient is aware the risks include but are not limited to: bleeding, infection, steal syndrome, nerve damage, ischemic monomelic neuropathy, failure to mature, and need for additional procedures.  The patient is aware of the risks and elects to proceed forward.  DESCRIPTION: After full informed written consent was obtained from the patient, the patient was brought back to the operating room and placed supine upon the operating table.  The patient was given IV antibiotics prior to proceeding.  After obtaining adequate sedation, the patient was prepped and draped in standard fashion for a left arm access procedure.  I turned my attention first to the antecubitum.   I made an incision over the brachial artery, and dissected down through the subcutaneous tissue to the fascia carefully and was able to dissect out the brachial artery.  The artery was about patent and of adequate size to support a graft.  It was controlled proximally and distally with vessel loops and then I turned my attention to the high bicipital groove in the axilla.  I made an incision and dissected down through the subcutaneous tissue and fascia until I reached the axillary vein.  It was patent and adequate size for graft creation.  I then dissected this vein proximally and distal and prepared it for control with Bulldog clamps.  I took a Dietitian and  dissected from the antecubital up to the axillary incision.  Then I delivered the 6 mm Propaten, through this metal tunneler and then pulled out the metal tunneler leaving the graft in place and making sure the line was up for orientation.  I then gave the patient 3000 units of heparin to gain some anticoagulation.  After waiting 3 minutes, I placed the brachial artery under tension proximally and distally with vessel loops, made an arteriotomy and extended it with a Potts scissor.  I sewed the graft to this arteriotomy with a running stitch of CV-6 suture.  At this point, then I completed the anastomosis in the usual fashion.  I released the vessel loops on the inflow and allowed the artery to decompress through the graft. There was good pulsatile bleeding through this graft.  I clamped the graft near its arterial anastomosis and sucked out all the blood in the graft and loaded the graft with heparinized saline.  At this point, I pulled the graft to appropriate length and reset my exposure of the axillary vein.  Then, I controlled the vein with Bulldog clamps.  An anterior wall venotomy was created with an 11 blade and extended with Potts scissors.  I then spatulated the graft to facilitate an end-to- side anastomosis matching the arteriotomy.  In the process of spatulating, I cut the graft to appropriate length for this anastomosis.  This graft was sewn to the vein in an end-to-side configuration with a running CV-6 suture.  Prior to completing this anastomosis, I allowed the vein to back bleed and  then I also allowed the artery to bleed in an antegrade fashion.  I completed this anastomosis in the usual fashion, and then irrigated out the high bicipital exposure and then placed Surgicel and Evicel.  I then turned my attention back to the antecubitum.  There was pulsatile flow in the artery beyond the graft.   At this point, I washed out the antecubital incision. Surgicel and Evicel were then placed. There was no  more active bleeding.  The subcutaneous tissue was reapproximated with a running stitch of 3-0 Vicryl.  The skin was then reapproximated with a running subcuticular 4-0 Monocryl.  The skin was then cleaned, dried, and Dermabond used to reinforce the skin closure.  We then turned our attention to the axillary incision.  The subcutaneous tissue was repaired with running stitch of 3-0 Vicryl.  The skin was then reapproximated with running subcuticular 4-0 Monocryl.  The skin was then cleaned, dried, and then the skin closure was reinforced with Dermabond.  The patient was then awakened from anesthesia and taken to the recovery room in stable condition having tolerated the procedure well.    COMPLICATIONS: None  CONDITION: Stable   [name] 08/17/2022 10:32 AM   This note was created with Dragon Medical transcription system. Any errors in dictation are purely unintentional.

## 2022-08-17 NOTE — Progress Notes (Signed)
Bruit and thrill present in LUE.

## 2022-08-18 ENCOUNTER — Encounter: Payer: Self-pay | Admitting: Vascular Surgery

## 2022-08-18 LAB — TYPE AND SCREEN
ABO/RH(D): A POS
Antibody Screen: POSITIVE
Unit division: 0
Unit division: 0

## 2022-08-18 LAB — BPAM RBC
Blood Product Expiration Date: 202310142359
Blood Product Expiration Date: 202310182359
Unit Type and Rh: 6200
Unit Type and Rh: 6200

## 2022-08-18 NOTE — Anesthesia Postprocedure Evaluation (Signed)
Anesthesia Post Note  Patient: [name]  Procedure(s) Performed: INSERTION OF ARTERIOVENOUS (AV) GORE-TEX GRAFT ARM (BRACHIAL AXILLARY) (Left)  Patient location during evaluation: PACU Anesthesia Type: General Level of consciousness: awake and alert Pain management: pain level controlled Vital Signs Assessment: post-procedure vital signs reviewed and stable Respiratory status: spontaneous breathing, nonlabored ventilation and respiratory function stable Cardiovascular status: blood pressure returned to baseline and stable Postop Assessment: no apparent nausea or vomiting Anesthetic complications: no   No notable events documented.   Last Vitals:  Vitals:   08/17/22 1054 08/17/22 1109  BP: 116/61 128/63  Pulse:  79  Resp: 20 20  Temp: 36.8 C 36.5 C  SpO2: 100% 100%    Last Pain:  Vitals:   08/17/22 1109  TempSrc: Oral  PainSc: 0-No pain                 [place] Ouch

## 2022-08-19 ENCOUNTER — Telehealth (INDEPENDENT_AMBULATORY_CARE_PROVIDER_SITE_OTHER): Payer: Self-pay

## 2022-08-19 NOTE — Telephone Encounter (Signed)
Will from [facility] left a message checking to see when they could start cannulation. Patient had insertion av graft on 08/17/22. I spoke with Arna Medici NP she recommended that typically cannulation start 4 weeks from placement. Will was made aware with medical recommendations.

## 2022-09-02 ENCOUNTER — Emergency Department
Admission: EM | Admit: 2022-09-02 | Discharge: 2022-09-02 | Disposition: A | Discharge status: Home / Self Care | Payer: Medicare Other | Attending: Emergency Medicine | Admitting: Emergency Medicine

## 2022-09-02 ENCOUNTER — Other Ambulatory Visit: Payer: Self-pay

## 2022-09-02 DIAGNOSIS — Z992 Dependence on renal dialysis: Secondary | ICD-10-CM | POA: Insufficient documentation

## 2022-09-02 DIAGNOSIS — N186 End stage renal disease: Secondary | ICD-10-CM | POA: Diagnosis not present

## 2022-09-02 DIAGNOSIS — Z789 Other specified health status: Secondary | ICD-10-CM

## 2022-09-02 DIAGNOSIS — Y828 Other medical devices associated with adverse incidents: Secondary | ICD-10-CM | POA: Insufficient documentation

## 2022-09-02 DIAGNOSIS — T8249XA Other complication of vascular dialysis catheter, initial encounter: Secondary | ICD-10-CM | POA: Diagnosis present

## 2022-09-02 LAB — BASIC METABOLIC PANEL
Anion gap: 10 (ref 5–15)
BUN: 17 mg/dL (ref 8–23)
CO2: 24 mmol/L (ref 22–32)
Calcium: 8.9 mg/dL (ref 8.9–10.3)
Chloride: 104 mmol/L (ref 98–111)
Creatinine, Ser: 3.77 mg/dL — ABNORMAL HIGH (ref 0.61–1.24)
GFR, Estimated: 16 mL/min — ABNORMAL LOW (ref >60)
Glucose, Bld: 75 mg/dL (ref 70–99)
Potassium: 5 mmol/L (ref 3.5–5.1)
Sodium: 138 mmol/L (ref 135–145)

## 2022-09-02 LAB — CBC WITH DIFFERENTIAL/PLATELET
Abs Immature Granulocytes: 0.01 10*3/uL (ref 0.00–0.07)
Basophils Absolute: 0.1 10*3/uL (ref 0.0–0.1)
Basophils Relative: 1 %
Eosinophils Absolute: 0.1 10*3/uL (ref 0.0–0.5)
Eosinophils Relative: 2 %
HCT: 38.4 % — ABNORMAL LOW (ref 39.0–52.0)
Hemoglobin: 10.9 g/dL — ABNORMAL LOW (ref 13.0–17.0)
Immature Granulocytes: 0 %
Lymphocytes Relative: 35 %
Lymphs Abs: 2.1 10*3/uL (ref 0.7–4.0)
MCH: 31 pg (ref 26.0–34.0)
MCHC: 28.4 g/dL — ABNORMAL LOW (ref 30.0–36.0)
MCV: 109.1 fL — ABNORMAL HIGH (ref 80.0–100.0)
Monocytes Absolute: 0.6 10*3/uL (ref 0.1–1.0)
Monocytes Relative: 11 %
Neutro Abs: 3.1 10*3/uL (ref 1.7–7.7)
Neutrophils Relative %: 51 %
Platelets: 276 10*3/uL (ref 150–400)
RBC: 3.52 MIL/uL — ABNORMAL LOW (ref 4.22–5.81)
RDW: 16 % — ABNORMAL HIGH (ref 11.5–15.5)
WBC: 5.9 10*3/uL (ref 4.0–10.5)
nRBC: 0 % (ref 0.0–0.2)

## 2022-09-02 NOTE — ED Notes (Signed)
Legal guardian Elmyra Ricks spoken to on the phone

## 2022-09-02 NOTE — ED Triage Notes (Signed)
Pt is from [facility] (group home) and was at dialysis today when they noticed the graft to his left [facility] has opened up. There is a small area of open skin noted to left [facility] with serous drainage. No bleeding noted. Swelling noted to left hand. Denies any other complaints.  Pt has dementia and he patient has no one here at the hospital with him. This Rn called his legal guardian [name] (number on file) and she informed me of his reason for the patient's visit today. She states the dialysis center called her. Unsure how much of his dialysis he received. She reports he is at baseline mentation.

## 2022-09-02 NOTE — ED Provider Triage Note (Signed)
  Emergency Medicine Provider Triage Evaluation Note  [name] , a 76 y.o.male,  was evaluated in triage.  Pt complains of vascular access problem.  Patient states that he is not sure why he is here or how he got here...  RN called his legal guardian, [name], states that the dialysis center called her and advised her to bring patient to the emergency department due to a open graft in his left AC.  He has had purulent drainage from the site as well.  Additionally has increased swelling and redness extending to his left hand.   Review of Systems  Positive: Drainage from left AC, redness/swelling left upper extremity. Negative: Denies fever, chest pain, vomiting  Physical Exam   Vitals:   09/02/22 1540  BP: 108/61  Pulse: 89  Resp: 18  Temp: 98.4 F (36.9 C)  SpO2: 96%   Gen:   Awake, no distress   Resp:  Normal effort  MSK:   Moves extremities without difficulty  Other:    Medical Decision Making  Given the patient's initial medical screening exam, the following diagnostic evaluation has been ordered. The patient will be placed in the appropriate treatment space, once one is available, to complete the evaluation and treatment. I have discussed the plan of care with the patient and I have advised the patient that an ED physician or mid-level practitioner will reevaluate their condition after the test results have been received, as the results may give them additional insight into the type of treatment they may need.    Diagnostics: Labs  Treatments: none immediately   Teodoro Spray, [place] 09/02/22 1614

## 2022-09-02 NOTE — ED Provider Notes (Signed)
[facility] Provider Note   Event Date/Time   First MD Initiated Contact with Patient 09/02/22 1608     (approximate) History  Vascular Access Problem  HPI [name] is a 76 y.o. male with a past medical history of ESRD who recently had an AV fistula placed on 08/17/2022 who presents for serous drainage from the [facility] incision site.  Patient has history of dementia and therefore history obtained from patient's caregiver over the phone states that patient was at dialysis today when they noticed the graft to the left AC incision was open and draining clear fluid.  No bleeding.  No surrounding erythema.  Patient was able to finish his dialysis   Physical Exam  Triage Vital Signs: ED Triage Vitals  Enc Vitals Group     BP 09/02/22 1540 108/61     Pulse Rate 09/02/22 1540 89     Resp 09/02/22 1540 18     Temp 09/02/22 1540 98.4 F (36.9 C)     Temp Source 09/02/22 1540 Oral     SpO2 09/02/22 1540 96 %     Weight 09/02/22 1551 172 lb (78 kg)     Height 09/02/22 1551 '5\' 8"'$  (1.727 m)     Head Circumference --      Peak Flow --      Pain Score 09/02/22 1551 0     Pain Loc --      Pain Edu? --      Excl. in [place]? --    Most recent vital signs: Vitals:   09/02/22 1540  BP: 108/61  Pulse: 89  Resp: 18  Temp: 98.4 F (36.9 C)  SpO2: 96%   General: Awake, oriented x2. CV:  Good peripheral perfusion.  Resp:  Normal effort.  Abd:  No distention.  Other:  Elderly African-American male laying in bed in no acute distress.  There he was incision site to the left [facility] with the medial portion open draining at this time of serous fluid ED Results / Procedures / Treatments  Labs (all labs ordered are listed, but only abnormal results are displayed) Labs Reviewed  CBC WITH DIFFERENTIAL/PLATELET - Abnormal; Notable for the following components:      Result Value   RBC 3.52 (*)    Hemoglobin 10.9 (*)    HCT 38.4 (*)    MCV 109.1 (*)    MCHC 28.4 (*)    RDW 16.0  (*)    All other components within normal limits  BASIC METABOLIC PANEL - Abnormal; Notable for the following components:   Creatinine, Ser 3.77 (*)    GFR, Estimated 16 (*)    All other components within normal limits   PROCEDURES: Critical Care performed: No Procedures MEDICATIONS ORDERED IN ED: Medications - No data to display IMPRESSION / MDM / [place] / ED COURSE  I reviewed the triage vital signs and the nursing notes.                             The patient is on the cardiac monitor to evaluate for evidence of arrhythmia and/or significant heart rate changes. Patient's presentation is most consistent with acute presentation with potential threat to life or bodily function. Patient is a 76 year old male who presents for wound dehiscence from a AV fistula surgery that was done on 08/17/2022.  Patient only has serous drainage from this open site and therefore it was repaired  at bedside prior to being dressed again and encouraged to follow-up with the surgical team who placed this fistula.  Laboratory evaluation does not show any evidence of infection or sepsis.  Physical exam does not show any evidence of infection  Dispo: Discharge home   FINAL CLINICAL IMPRESSION(S) / ED DIAGNOSES   Final diagnoses:  Problem with vascular access   Rx / DC Orders   ED Discharge Orders     None      Note:  This document was prepared using Dragon voice recognition software and may include unintentional dictation errors.   Naaman Plummer, MD 09/02/22 2007

## 2022-09-06 ENCOUNTER — Ambulatory Visit (INDEPENDENT_AMBULATORY_CARE_PROVIDER_SITE_OTHER): Payer: Medicare Other | Admitting: Nurse Practitioner

## 2022-09-06 ENCOUNTER — Encounter (INDEPENDENT_AMBULATORY_CARE_PROVIDER_SITE_OTHER): Payer: Self-pay | Admitting: Nurse Practitioner

## 2022-09-06 VITALS — BP 93/61 | HR 58 | Resp 18 | Ht 69.0 in | Wt 174.0 lb

## 2022-09-06 DIAGNOSIS — N184 Chronic kidney disease, stage 4 (severe): Secondary | ICD-10-CM

## 2022-09-07 ENCOUNTER — Telehealth (INDEPENDENT_AMBULATORY_CARE_PROVIDER_SITE_OTHER): Payer: Self-pay

## 2022-09-07 NOTE — Telephone Encounter (Signed)
Called and discussed with [name] as well as the NP [employer]

## 2022-09-07 NOTE — Telephone Encounter (Signed)
[name] calls with concerns about office notes or the visit from yesterday about the graft in general.  Please advise.

## 2022-09-09 ENCOUNTER — Encounter (INDEPENDENT_AMBULATORY_CARE_PROVIDER_SITE_OTHER): Payer: Medicare Other

## 2022-09-09 ENCOUNTER — Ambulatory Visit (INDEPENDENT_AMBULATORY_CARE_PROVIDER_SITE_OTHER): Payer: Medicare Other | Admitting: Nurse Practitioner

## 2022-09-11 ENCOUNTER — Encounter (INDEPENDENT_AMBULATORY_CARE_PROVIDER_SITE_OTHER): Payer: Self-pay | Admitting: Nurse Practitioner

## 2022-09-11 NOTE — Progress Notes (Signed)
Subjective:    Patient ID: [name], male    DOB: [birth year], 76 y.o.   MRN: [id number] No chief complaint on file.   The patient presents today for follow-up wound evaluation after recent slight dehiscence of left AV graft insertion.  There was concern by the dialysis center that his graft may be exposed.  Today evaluation by myself and Dr. Lucky Cowboy show that the graft is not visible but the it is indeed dehisced.    Review of Systems  Skin:  Positive for wound.  All other systems reviewed and are negative.      Objective:   Physical Exam Vitals reviewed.  HENT:     Head: Normocephalic.  Cardiovascular:     Rate and Rhythm: Normal rate.     Pulses:          Radial pulses are 1+ on the left side.  Pulmonary:     Effort: Pulmonary effort is normal.  Skin:    General: Skin is warm and dry.  Neurological:     Mental Status: He is oriented to person, place, and time.  Psychiatric:        Mood and Affect: Mood normal.        Behavior: Behavior normal.        Thought Content: Thought content normal.        Judgment: Judgment normal.     BP 93/61 (BP Location: Left Arm)   Pulse (!) 58   Resp 18   Ht '5\' 9"'$  (1.753 m)   Wt 174 lb (78.9 kg)   BMI 25.70 kg/m   Past Medical History:  Diagnosis Date   Alcoholism ([place])    Allergy    Altered mental status 2011   hx of 2/2 hypercalcemia, EtOH w/d and medication overuse   Angina    mild pain on arm, left chest, moves down left chest   BPH (benign prostatic hyperplasia)    Chronic kidney disease, stage 4 (severe) (HCC)    Chronic renal insufficiency    with history of acute on chronic secondary to dehydration and hypercalcemia   Dementia (HCC)    Encephalopathy    Fx humeral neck 2008   right side, no history of surgical repair   History of nephrolithiasis 2008   s/p ureterolithotomy    Hypercalcemia    2/2 primary hyperparathyroidism   Hyperkalemia    Hypertension    Pancreatitis 2011   history of several  admissions for pancreatitis    Parathyroid adenoma    Primary hyperparathyroidism ([place])    s/p parathyroid adenoma resection Oct. 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[place])     Social History   Socioeconomic History   Marital status: Single    Spouse name: Not on file   Number of children: Not on file   Years of education: Not on file   Highest education level: Not on file  Occupational History   Occupation: disabled  Tobacco Use   Smoking status: Every Day    Packs/day: 1.00    Years: 50.00    Total pack years: 50.00    Types: Cigarettes   Smokeless tobacco: Never  Substance and Sexual Activity   Alcohol use: No    Comment: 03/09/12 "last alcohol 1983"; history of heavy  alcohol use   Drug use: No    Types: Marijuana    Comment: 03/09/12 Last drug use "~1978"   Sexual activity: Never  Other Topics Concern   Not on file  Social History Narrative   Patient is disabled. Patient lives with at group home [facility]) Patient has history of being in prison.History of alcoholism   Social Determinants of Radio broadcast assistant Strain: Not on file  Food Insecurity: Not on file  Transportation Needs: Not on file  Physical Activity: Not on file  Stress: Not on file  Social Connections: Not on file  Intimate Partner Violence: Not on file    Past Surgical History:  Procedure Laterality Date   Ankle Pinning     AV FISTULA PLACEMENT Left 08/17/2022   Procedure: INSERTION OF ARTERIOVENOUS (AV) GORE-TEX GRAFT ARM (BRACHIAL AXILLARY);  Surgeon: Algernon Huxley, MD;  Location: [facility];  Service: Vascular;  Laterality: Left;   cysto, removal of bladder stone, attempted stent placement  08/15/2007   cystoscopy, laser cystolithoplaxy  08/16/2007   DIALYSIS/PERMA CATHETER INSERTION N/A 07/04/2022    Procedure: DIALYSIS/PERMA CATHETER INSERTION;  Surgeon: Algernon Huxley, MD;  Location: [facility];  Service: Cardiovascular;  Laterality: N/A;   KIDNEY STONE SURGERY  07/2007 X2   /E-chart   open left proximal ureterolithotomy  09/18/2007   parathyroid adenoma resection  09/04/2010   residual adenoma Oct. 12, 2011   PARATHYROIDECTOMY  03/09/2012   Procedure: PARATHYROIDECTOMY;  Surgeon: Pedro Earls, MD;  Location: [facility];  Service: General;  Laterality: N/A;  mediastinal paraqthyroidectomy    THYROIDECTOMY  03/09/2012    Family History  Problem Relation Age of Onset   Kidney cancer Neg Hx    Bladder Cancer Neg Hx    Prostate cancer Neg Hx     No Known Allergies     Latest Ref Rng & Units 09/02/2022    4:02 PM 08/17/2022    6:50 AM 08/11/2022    9:08 AM  CBC  WBC 4.0 - 10.5 K/uL 5.9   4.9   Hemoglobin 13.0 - 17.0 g/dL 10.9  12.6  9.8   Hematocrit 39.0 - 52.0 % 38.4  37.0  31.4   Platelets 150 - 400 K/uL 276   249       CMP     Component Value Date/Time   NA 138 09/02/2022 1602   NA 135 03/08/2017 1102   K 5.0 09/02/2022 1602   CL 104 09/02/2022 1602   CO2 24 09/02/2022 1602   GLUCOSE 75 09/02/2022 1602   BUN 17 09/02/2022 1602   BUN 45 (H) 03/08/2017 1102   CREATININE 3.77 (H) 09/02/2022 1602   CALCIUM 8.9 09/02/2022 1602   CALCIUM >15.0 (HH) 11/09/2011 1455   PROT 8.3 (H) 04/17/2017 1448   ALBUMIN 4.1 01/18/2019 1040   ALBUMIN 4.1 03/08/2017 1102   AST 20 04/17/2017 1448   ALT 44 04/17/2017 1448   ALKPHOS 94 04/17/2017 1448   BILITOT 0.9 04/17/2017 1448   GFRNONAA 16 (L) 09/02/2022 1602   GFRAA 21 (L) 01/20/2019 0924     No results found.     Assessment & Plan:   1. CKD (chronic kidney disease) stage 4, GFR 15-29 ml/min (HCC) The patient's wound is secured with Steri-Strips.  Dry dressing over the top.  Discussed with dialysis center and they will apply myosin for the next couple of weeks during his dialysis sessions to ensure no overarching  infection.  We  will have the patient return in 2 weeks for wound reevaluation.   Current Outpatient Medications on File Prior to Visit  Medication Sig Dispense Refill   amLODipine (NORVASC) 5 MG tablet Take 5 mg by mouth daily.     atorvastatin (LIPITOR) 20 MG tablet Take 1 tablet (20 mg total) by mouth daily. 30 tablet 6   calcitRIOL (ROCALTROL) 0.5 MCG capsule Take 1 capsule (0.5 mcg total) by mouth daily. 30 capsule 2   cinacalcet (SENSIPAR) 30 MG tablet Take 30 mg by mouth daily.     cloNIDine (CATAPRES) 0.1 MG tablet Take 0.1 mg by mouth 2 (two) times daily.     denosumab (PROLIA) 60 MG/ML SOSY injection Inject 1 mL into the skin every 6 (six) months.     docusate sodium (COLACE) 100 MG capsule Take 1 capsule (100 mg total) by mouth 2 (two) times daily. (Patient taking differently: Take 100 mg by mouth 2 (two) times daily as needed.) 60 capsule 6   doxazosin (CARDURA) 2 MG tablet Take 2 mg by mouth at bedtime.     ergocalciferol (VITAMIN D2) 50000 units capsule Take 1 capsule (50,000 Units total) by mouth every 6 (six) weeks. 30 capsule 6   finasteride (PROSCAR) 5 MG tablet TAKE ONE TABLET BY MOUTH EVERY DAY FOR ENLARGED PROSTATE (Patient taking differently: Take 5 mg by mouth daily. TAKE ONE TABLET BY MOUTH EVERY DAY FOR ENLARGED PROSTATE) 90 tablet 3   fluticasone (FLONASE) 50 MCG/ACT nasal spray USE 2 SPRAYS IN EACH NOSTRIL DAILY AS NEEDED (Patient taking differently: Place 1 spray into both nostrils daily.) 16 g 11   folic acid (FOLVITE) 1 MG tablet Take 1 tablet (1 mg total) by mouth daily. 30 tablet 6   gabapentin (NEURONTIN) 400 MG capsule Take 400 mg by mouth 2 (two) times daily.     glucose blood test strip 1 each by Other route 2 (two) times daily. Use as instructed 100 each 5   HYDROcodone-acetaminophen (NORCO/VICODIN) 5-325 MG tablet Take 2 tablets by mouth every 6 (six) hours as needed for moderate pain. 20 tablet 0   Insulin Glargine (LANTUS SOLOSTAR) 100 UNIT/ML Solostar Pen  Inject 10 Units into the skin Nightly.     losartan (COZAAR) 100 MG tablet Take 100 mg by mouth daily.     MELATONIN TR 1 MG TBCR TAKE 1 TABLET BY MOUTH EVERY NIGHT AT BEDTIME 30 tablet 0   Multiple Vitamins-Minerals (MULTIVITAMIN WITH MINERALS) tablet TAKE 1 TABLET BY MOUTH DAILY 30 tablet 5   OLANZapine (ZYPREXA) 15 MG tablet Take 15 mg by mouth at bedtime.     pioglitazone (ACTOS) 30 MG tablet Take 30 mg by mouth daily.     thiamine (VITAMIN B-1) 100 MG tablet Take 1 tablet (100 mg total) by mouth daily. 30 tablet 6   traZODone (DESYREL) 100 MG tablet Take 100 mg by mouth at bedtime.     TRUEPLUS LANCETS 26G MISC 1 each by Does not apply route 2 (two) times daily. 100 each 5   No current facility-administered medications on file prior to visit.    There are no Patient Instructions on file for this visit. No follow-ups on file.   Kris Hartmann, NP

## 2022-09-12 ENCOUNTER — Telehealth (INDEPENDENT_AMBULATORY_CARE_PROVIDER_SITE_OTHER): Payer: Self-pay | Admitting: Vascular Surgery

## 2022-09-12 NOTE — Telephone Encounter (Signed)
Will, Cordinator at [facility] called and stated patient has open wound and it looks a lot worst than it did when he was seen last Tuesday.  He would like for patient to be seen ASAP.  Please call Will at [phone].

## 2022-09-13 NOTE — Telephone Encounter (Signed)
Bring him in for nurse visit so we can take a look

## 2022-09-14 ENCOUNTER — Other Ambulatory Visit (INDEPENDENT_AMBULATORY_CARE_PROVIDER_SITE_OTHER): Payer: Self-pay | Admitting: Nurse Practitioner

## 2022-09-14 ENCOUNTER — Ambulatory Visit (INDEPENDENT_AMBULATORY_CARE_PROVIDER_SITE_OTHER): Payer: Medicare Other | Admitting: Nurse Practitioner

## 2022-09-14 ENCOUNTER — Encounter (INDEPENDENT_AMBULATORY_CARE_PROVIDER_SITE_OTHER): Payer: Self-pay | Admitting: Nurse Practitioner

## 2022-09-14 VITALS — BP 104/58 | HR 82 | Resp 16 | Wt 175.2 lb

## 2022-09-14 DIAGNOSIS — T8149XA Infection following a procedure, other surgical site, initial encounter: Secondary | ICD-10-CM

## 2022-09-14 NOTE — Telephone Encounter (Signed)
Patient is schedule to come in today

## 2022-09-19 ENCOUNTER — Encounter (INDEPENDENT_AMBULATORY_CARE_PROVIDER_SITE_OTHER): Payer: Self-pay | Admitting: Nurse Practitioner

## 2022-09-19 ENCOUNTER — Other Ambulatory Visit (INDEPENDENT_AMBULATORY_CARE_PROVIDER_SITE_OTHER): Payer: Self-pay | Admitting: Vascular Surgery

## 2022-09-19 DIAGNOSIS — N186 End stage renal disease: Secondary | ICD-10-CM

## 2022-09-19 DIAGNOSIS — Z95828 Presence of other vascular implants and grafts: Secondary | ICD-10-CM

## 2022-09-19 LAB — AEROBIC CULTURE

## 2022-09-19 NOTE — Progress Notes (Signed)
The patient is a small dehiscence of his surgical incision of his left antecubital fossa.  Unfortunately, the patient is not able to care for the wound itself.  He does not follow instructions well.  He currently lives at a group home and his only wound evaluation has been his dialysis center 3 times per week.  It would be in the patient's best interest to have home health help with proper wound care.  Currently the wound is not infected with just some fibrinous exudate in the wound.  This is critically important because if the wound becomes infected it would likely need to be excised due to the presence of a graft.

## 2022-09-20 ENCOUNTER — Encounter (INDEPENDENT_AMBULATORY_CARE_PROVIDER_SITE_OTHER): Payer: Medicare Other

## 2022-09-20 ENCOUNTER — Ambulatory Visit (INDEPENDENT_AMBULATORY_CARE_PROVIDER_SITE_OTHER): Payer: Medicare Other | Admitting: Nurse Practitioner

## 2022-09-21 ENCOUNTER — Telehealth (INDEPENDENT_AMBULATORY_CARE_PROVIDER_SITE_OTHER): Payer: Self-pay

## 2022-09-21 NOTE — Telephone Encounter (Deleted)
Home health orders for skilled nursing to start weekly left unna wraps has been faxed to [facility] home health. Weekly wraps should start next week.

## 2022-09-21 NOTE — Telephone Encounter (Signed)
Home health orders was faxed on 09/15/22 to [facility] home health for wound care to be done twice a week with Aquacel and gauze

## 2022-09-29 ENCOUNTER — Ambulatory Visit (INDEPENDENT_AMBULATORY_CARE_PROVIDER_SITE_OTHER): Payer: Medicare Other | Admitting: Nurse Practitioner

## 2022-09-29 ENCOUNTER — Encounter (INDEPENDENT_AMBULATORY_CARE_PROVIDER_SITE_OTHER): Payer: Self-pay | Admitting: Nurse Practitioner

## 2022-09-29 VITALS — BP 103/61 | HR 89 | Resp 18 | Ht 69.0 in | Wt 167.0 lb

## 2022-09-29 DIAGNOSIS — T8149XA Infection following a procedure, other surgical site, initial encounter: Secondary | ICD-10-CM

## 2022-09-29 MED ORDER — SULFAMETHOXAZOLE-TRIMETHOPRIM 400-80 MG PO TABS: 1.0000 | ORAL_TABLET | Freq: Two times a day (BID) | ORAL | 0 refills | Status: DC

## 2022-09-30 ENCOUNTER — Emergency Department: Payer: Medicare Other

## 2022-09-30 ENCOUNTER — Observation Stay
Admission: EM | Admit: 2022-09-30 | Discharge: 2022-10-02 | Disposition: A | Discharge status: Home Health | Payer: Medicare Other | Attending: Internal Medicine | Admitting: Internal Medicine

## 2022-09-30 DIAGNOSIS — I959 Hypotension, unspecified: Secondary | ICD-10-CM

## 2022-09-30 DIAGNOSIS — I10 Essential (primary) hypertension: Secondary | ICD-10-CM | POA: Diagnosis present

## 2022-09-30 DIAGNOSIS — N401 Enlarged prostate with lower urinary tract symptoms: Secondary | ICD-10-CM | POA: Diagnosis present

## 2022-09-30 DIAGNOSIS — F17210 Nicotine dependence, cigarettes, uncomplicated: Secondary | ICD-10-CM | POA: Diagnosis not present

## 2022-09-30 DIAGNOSIS — R531 Weakness: Secondary | ICD-10-CM | POA: Diagnosis present

## 2022-09-30 DIAGNOSIS — R0602 Shortness of breath: Principal | ICD-10-CM

## 2022-09-30 DIAGNOSIS — Z794 Long term (current) use of insulin: Secondary | ICD-10-CM | POA: Insufficient documentation

## 2022-09-30 DIAGNOSIS — I120 Hypertensive chronic kidney disease with stage 5 chronic kidney disease or end stage renal disease: Secondary | ICD-10-CM | POA: Insufficient documentation

## 2022-09-30 DIAGNOSIS — N186 End stage renal disease: Secondary | ICD-10-CM

## 2022-09-30 DIAGNOSIS — R7989 Other specified abnormal findings of blood chemistry: Secondary | ICD-10-CM | POA: Diagnosis not present

## 2022-09-30 DIAGNOSIS — Z7951 Long term (current) use of inhaled steroids: Secondary | ICD-10-CM | POA: Diagnosis not present

## 2022-09-30 DIAGNOSIS — Z992 Dependence on renal dialysis: Secondary | ICD-10-CM | POA: Insufficient documentation

## 2022-09-30 DIAGNOSIS — F209 Schizophrenia, unspecified: Secondary | ICD-10-CM | POA: Diagnosis present

## 2022-09-30 DIAGNOSIS — E1140 Type 2 diabetes mellitus with diabetic neuropathy, unspecified: Secondary | ICD-10-CM | POA: Diagnosis not present

## 2022-09-30 DIAGNOSIS — E1122 Type 2 diabetes mellitus with diabetic chronic kidney disease: Secondary | ICD-10-CM | POA: Diagnosis not present

## 2022-09-30 DIAGNOSIS — Z79899 Other long term (current) drug therapy: Secondary | ICD-10-CM | POA: Insufficient documentation

## 2022-09-30 DIAGNOSIS — Z720 Tobacco use: Secondary | ICD-10-CM | POA: Diagnosis present

## 2022-09-30 DIAGNOSIS — E785 Hyperlipidemia, unspecified: Secondary | ICD-10-CM | POA: Diagnosis present

## 2022-09-30 DIAGNOSIS — Z1152 Encounter for screening for COVID-19: Secondary | ICD-10-CM | POA: Diagnosis not present

## 2022-09-30 DIAGNOSIS — E119 Type 2 diabetes mellitus without complications: Secondary | ICD-10-CM

## 2022-09-30 LAB — URINALYSIS, ROUTINE W REFLEX MICROSCOPIC
Bacteria, UA: NONE SEEN
Bilirubin Urine: NEGATIVE
Glucose, UA: NEGATIVE mg/dL
Hgb urine dipstick: NEGATIVE
Ketones, ur: NEGATIVE mg/dL
Leukocytes,Ua: NEGATIVE
Nitrite: NEGATIVE
Protein, ur: 100 mg/dL — AB
Specific Gravity, Urine: 1.012 (ref 1.005–1.030)
pH: 8 (ref 5.0–8.0)

## 2022-09-30 LAB — CBC WITH DIFFERENTIAL/PLATELET
Abs Immature Granulocytes: 0.04 10*3/uL (ref 0.00–0.07)
Basophils Absolute: 0 10*3/uL (ref 0.0–0.1)
Basophils Relative: 0 %
Eosinophils Absolute: 0.1 10*3/uL (ref 0.0–0.5)
Eosinophils Relative: 1 %
HCT: 32.8 % — ABNORMAL LOW (ref 39.0–52.0)
Hemoglobin: 10.3 g/dL — ABNORMAL LOW (ref 13.0–17.0)
Immature Granulocytes: 1 %
Lymphocytes Relative: 11 %
Lymphs Abs: 0.9 10*3/uL (ref 0.7–4.0)
MCH: 30.1 pg (ref 26.0–34.0)
MCHC: 31.4 g/dL (ref 30.0–36.0)
MCV: 95.9 fL (ref 80.0–100.0)
Monocytes Absolute: 0.5 10*3/uL (ref 0.1–1.0)
Monocytes Relative: 7 %
Neutro Abs: 6.4 10*3/uL (ref 1.7–7.7)
Neutrophils Relative %: 80 %
Platelets: 360 10*3/uL (ref 150–400)
RBC: 3.42 MIL/uL — ABNORMAL LOW (ref 4.22–5.81)
RDW: 14.9 % (ref 11.5–15.5)
WBC: 8 10*3/uL (ref 4.0–10.5)
nRBC: 0 % (ref 0.0–0.2)

## 2022-09-30 LAB — GLUCOSE, CAPILLARY
Glucose-Capillary: 160 mg/dL — ABNORMAL HIGH (ref 70–99)
Glucose-Capillary: 153 mg/dL — ABNORMAL HIGH (ref 70–99)
Glucose-Capillary: 167 mg/dL — ABNORMAL HIGH (ref 70–99)

## 2022-09-30 LAB — RESP PANEL BY RT-PCR (FLU A&B, COVID) ARPGX2
Influenza A by PCR: NEGATIVE
Influenza B by PCR: NEGATIVE
SARS Coronavirus 2 by RT PCR: NEGATIVE

## 2022-09-30 LAB — COMPREHENSIVE METABOLIC PANEL
ALT: 21 U/L (ref 0–44)
AST: 32 U/L (ref 15–41)
Albumin: 3.1 g/dL — ABNORMAL LOW (ref 3.5–5.0)
Alkaline Phosphatase: 59 U/L (ref 38–126)
Anion gap: 11 (ref 5–15)
BUN: 40 mg/dL — ABNORMAL HIGH (ref 8–23)
CO2: 27 mmol/L (ref 22–32)
Calcium: 9.1 mg/dL (ref 8.9–10.3)
Chloride: 106 mmol/L (ref 98–111)
Creatinine, Ser: 6.14 mg/dL — ABNORMAL HIGH (ref 0.61–1.24)
GFR, Estimated: 9 mL/min — ABNORMAL LOW (ref >60)
Glucose, Bld: 120 mg/dL — ABNORMAL HIGH (ref 70–99)
Potassium: 4.6 mmol/L (ref 3.5–5.1)
Sodium: 144 mmol/L (ref 135–145)
Total Bilirubin: 0.8 mg/dL (ref 0.3–1.2)
Total Protein: 7.5 g/dL (ref 6.5–8.1)

## 2022-09-30 LAB — TROPONIN I (HIGH SENSITIVITY)
Troponin I (High Sensitivity): 30 ng/L — ABNORMAL HIGH (ref <18)
Troponin I (High Sensitivity): 27 ng/L — ABNORMAL HIGH (ref <18)

## 2022-09-30 LAB — LACTIC ACID, PLASMA: Lactic Acid, Venous: 1.2 mmol/L (ref 0.5–1.9)

## 2022-09-30 MED ORDER — ONDANSETRON HCL 4 MG PO TABS: 4.0000 mg | ORAL_TABLET | Freq: Four times a day (QID) | ORAL | Status: DC | PRN

## 2022-09-30 MED ORDER — ATORVASTATIN CALCIUM 20 MG PO TABS
20.0000 mg | ORAL_TABLET | Freq: Every day | ORAL | Status: DC
  Administered 2022-09-30 – 2022-10-01 (×2): 20 mg via ORAL
  Filled 2022-09-30 (×2): qty 1

## 2022-09-30 MED ORDER — ACETAMINOPHEN 325 MG PO TABS: 650.0000 mg | ORAL_TABLET | Freq: Four times a day (QID) | ORAL | Status: DC | PRN

## 2022-09-30 MED ORDER — FINASTERIDE 5 MG PO TABS
5.0000 mg | ORAL_TABLET | Freq: Every day | ORAL | Status: DC
  Administered 2022-10-01 – 2022-10-02 (×2): 5 mg via ORAL
  Filled 2022-09-30 (×2): qty 1

## 2022-09-30 MED ORDER — MELATONIN 5 MG PO TABS: 5.0000 mg | ORAL_TABLET | Freq: Every evening | ORAL | Status: DC | PRN

## 2022-09-30 MED ORDER — INSULIN ASPART 100 UNIT/ML IJ SOLN: 0.0000 [IU] | Freq: Every day | INTRAMUSCULAR | Status: DC

## 2022-09-30 MED ORDER — SENNOSIDES-DOCUSATE SODIUM 8.6-50 MG PO TABS: 1.0000 | ORAL_TABLET | Freq: Every evening | ORAL | Status: DC | PRN

## 2022-09-30 MED ORDER — SODIUM CHLORIDE 0.9 % IV BOLUS: 500.0000 mL | Freq: Once | INTRAVENOUS | Status: DC

## 2022-09-30 MED ORDER — HYDRALAZINE HCL 20 MG/ML IJ SOLN: 2.0000 mg | Freq: Four times a day (QID) | INTRAMUSCULAR | Status: DC | PRN

## 2022-09-30 MED ORDER — TRAZODONE HCL 100 MG PO TABS
100.0000 mg | ORAL_TABLET | Freq: Every day | ORAL | Status: DC
  Administered 2022-09-30 – 2022-10-01 (×2): 100 mg via ORAL
  Filled 2022-09-30 (×2): qty 1

## 2022-09-30 MED ORDER — INSULIN ASPART 100 UNIT/ML IJ SOLN
0.0000 [IU] | Freq: Three times a day (TID) | INTRAMUSCULAR | Status: DC
  Administered 2022-09-30 – 2022-10-02 (×4): 1 [IU] via SUBCUTANEOUS
  Filled 2022-09-30 (×4): qty 1

## 2022-09-30 MED ORDER — ACETAMINOPHEN 650 MG RE SUPP: 650.0000 mg | Freq: Four times a day (QID) | RECTAL | Status: DC | PRN

## 2022-09-30 MED ORDER — ONDANSETRON HCL 4 MG/2ML IJ SOLN: 4.0000 mg | Freq: Four times a day (QID) | INTRAMUSCULAR | Status: DC | PRN

## 2022-09-30 MED ORDER — NICOTINE 14 MG/24HR TD PT24: 14.0000 mg | MEDICATED_PATCH | Freq: Every day | TRANSDERMAL | Status: DC | PRN

## 2022-09-30 MED ORDER — HEPARIN SODIUM (PORCINE) 5000 UNIT/ML IJ SOLN
5000.0000 [IU] | Freq: Three times a day (TID) | INTRAMUSCULAR | Status: DC
  Administered 2022-10-01 – 2022-10-02 (×5): 5000 [IU] via SUBCUTANEOUS
  Filled 2022-09-30 (×5): qty 1

## 2022-09-30 MED ORDER — INSULIN GLARGINE-YFGN 100 UNIT/ML ~~LOC~~ SOLN
10.0000 [IU] | Freq: Every day | SUBCUTANEOUS | Status: DC
  Administered 2022-10-01 – 2022-10-02 (×2): 10 [IU] via SUBCUTANEOUS
  Filled 2022-09-30 (×2): qty 0.1

## 2022-09-30 NOTE — Hospital Course (Addendum)
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 Patient missed his dialysis yesterday.  Lab test showed no leukocytosis, UA does not suggest UTI.  Chest x-ray did not show pneumonia.  Skin has no cellulitis.  However, initial blood draw at admission showed procalcitonin level of 20.82.  Blood culture sent out.

## 2022-09-30 NOTE — ED Notes (Signed)
Informed RN bed assigned 

## 2022-09-30 NOTE — ED Provider Notes (Signed)
[facility] Provider Note    Event Date/Time   First MD Initiated Contact with Patient 09/30/22 1146     (approximate)   History   Weakness   HPI  [name] is a 76 y.o. male with history of ESRD on dialysis M/W/F, diabetes and hyperlipidemia who presents with generalized weakness noted today when the patient was unable to get into the [facility] to go to dialysis.  His last dialysis session was 2 days ago.  The patient reports pain in his knees and feels generally weak but denies other acute complaints.  Per EMS he has a history of multiple UTIs and his clothes are soaked with urine.  I reviewed the past medical records.  The patient's most recent outpatient encounter was with vascular surgery on 10/18.  He was evaluated for dehiscence of his left antecubital fossa dialysis graft site.  At that time there was no evidence of infection and the graft itself was not exposed.  The recommendation was for home health as the patient lives at a group home.   Physical Exam   Triage Vital Signs: ED Triage Vitals  Enc Vitals Group     BP --      Pulse --      Resp --      Temp --      Temp src --      SpO2 --      Weight 09/30/22 1145 166 lb 14.2 oz (75.7 kg)     Height 09/30/22 1145 '5\' 9"'$  (1.753 m)     Head Circumference --      Peak Flow --      Pain Score 09/30/22 1144 0     Pain Loc --      Pain Edu? --      Excl. in [place]? --     Most recent vital signs: Vitals:   09/30/22 1151 09/30/22 1500  BP: (!) 94/59 116/62  Pulse: 80 80  Resp: 18 13  Temp: 98.8 F (37.1 C) 98.8 F (37.1 C)  SpO2: 100% 100%     General: Alert, oriented x2, no distress.  CV:  Good peripheral perfusion.  Resp:  Normal effort.  Abd:  Soft and nontender.  No distention.  Other:  EOMI.  PERRLA.  Slightly dry mucous membranes.  Motor intact in all extremities.  No peripheral edema.  Left antecubital fossa wound with dehiscence but no surrounding erythema, induration, or  abnormal warmth.  No purulent drainage.   ED Results / Procedures / Treatments   Labs (all labs ordered are listed, but only abnormal results are displayed) Labs Reviewed  COMPREHENSIVE METABOLIC PANEL - Abnormal; Notable for the following components:      Result Value   Glucose, Bld 120 (*)    BUN 40 (*)    Creatinine, Ser 6.14 (*)    Albumin 3.1 (*)    GFR, Estimated 9 (*)    All other components within normal limits  CBC WITH DIFFERENTIAL/PLATELET - Abnormal; Notable for the following components:   RBC 3.42 (*)    Hemoglobin 10.3 (*)    HCT 32.8 (*)    All other components within normal limits  URINALYSIS, ROUTINE W REFLEX MICROSCOPIC - Abnormal; Notable for the following components:   Color, Urine YELLOW (*)    APPearance CLEAR (*)    Protein, ur 100 (*)    All other components within normal limits  TROPONIN I (HIGH SENSITIVITY) - Abnormal; Notable for  the following components:   Troponin I (High Sensitivity) 30 (*)    All other components within normal limits  RESP PANEL BY RT-PCR (FLU A&B, COVID) ARPGX2  LACTIC ACID, PLASMA  TROPONIN I (HIGH SENSITIVITY)     EKG  ED ECG REPORT I, Arta Silence, the attending physician, personally viewed and interpreted this ECG.  Date: 09/30/2022 EKG Time: 1155 Rate: 82 Rhythm: normal sinus rhythm QRS Axis: normal Intervals: normal ST/T Wave abnormalities: normal Narrative Interpretation: no evidence of acute ischemia    RADIOLOGY  Chest x-ray: I independently viewed and interpreted the images; there is no focal consolidation or edema  PROCEDURES:  Critical Care performed: Yes, see critical care procedure note(s)  .Critical Care  Performed by: Arta Silence, MD Authorized by: Arta Silence, MD   Critical care provider statement:    Critical care time (minutes):  30   Critical care time was exclusive of:  Separately billable procedures and treating other patients   Critical care was necessary to  treat or prevent imminent or life-threatening deterioration of the following conditions:  Circulatory failure   Critical care was time spent personally by me on the following activities:  Development of treatment plan with patient or surrogate, discussions with consultants, evaluation of patient's response to treatment, examination of patient, ordering and review of laboratory studies, ordering and review of radiographic studies, ordering and performing treatments and interventions, pulse oximetry, re-evaluation of patient's condition, review of old charts and obtaining history from patient or surrogate   Care discussed with: admitting provider      MEDICATIONS ORDERED IN ED: Medications  sodium chloride 0.9 % bolus 500 mL (has no administration in time range)  acetaminophen (TYLENOL) tablet 650 mg (has no administration in time range)    Or  acetaminophen (TYLENOL) suppository 650 mg (has no administration in time range)  ondansetron (ZOFRAN) tablet 4 mg (has no administration in time range)    Or  ondansetron (ZOFRAN) injection 4 mg (has no administration in time range)  heparin injection 5,000 Units (has no administration in time range)  senna-docusate (Senokot-S) tablet 1 tablet (has no administration in time range)     IMPRESSION / MDM / [place] / ED COURSE  I reviewed the triage vital signs and the nursing notes.  76 year old male with PMH as noted above presents with generalized weakness.  He was found to be hypotensive in the field by EMS.  Differential diagnosis includes, but is not limited to, UTI, other acute infection/sepsis, hypovolemia, uremia, electrolyte abnormality, other metabolic disturbance, less likely cardiac cause.  We will give a fluid bolus, obtain lab work-up, and reassess.  I anticipate the patient will need admission.  Patient's presentation is most consistent with acute presentation with potential threat to life or bodily function.  The patient  is on the cardiac monitor to evaluate for evidence of arrhythmia and/or significant heart rate changes.  ----------------------------------------- 3:23 PM on 09/30/2022 -----------------------------------------  Blood pressure has improved.  Lab work-up is overall reassuring with no leukocytosis, no significant electrolyte abnormalities, normal lactate, and negative respiratory panel and UA.  However, given the patient's weakness, inability ambulate, and the episode of hypotension, I consulted Dr. Tobie Poet from the hospitalist service; based on her discussion she agrees to admit the patient.    FINAL CLINICAL IMPRESSION(S) / ED DIAGNOSES   Final diagnoses:  Weakness  ESRD (end stage renal disease) (HCC)  Hypotension, unspecified hypotension type     Rx / DC Orders   ED Discharge  Orders     None        Note:  This document was prepared using Dragon voice recognition software and may include unintentional dictation errors.    Arta Silence, MD 09/30/22 1524

## 2022-09-30 NOTE — Assessment & Plan Note (Signed)
-   Atorvastatin 20 mg daily

## 2022-09-30 NOTE — Assessment & Plan Note (Signed)
-   Olanzapine 50 mg daily

## 2022-09-30 NOTE — H&P (Signed)
History and Physical   [name]:[id number] DOB: 07-31-1946 DOA: 09/30/2022  PCP: [facility]  Outpatient Specialists: Dr. Lucky Cowboy, vascular Patient coming from: Group home via EMS  I have personally briefly reviewed patient's old medical records in [place].  Chief Concern: Weakness and hypotension  HPI: 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  Initial vitals in the emergency department showed temperature 98.8, respiration rate of 18, heart rate of 80, blood pressure 94/59, SPO2 of 100% on room air.  Per ED report, with EMS patient's blood pressure was 70s over 50s.  Serum sodium is 144, potassium 4.6, chloride of 106, bicarb 27, BUN of 40, serum creatinine of 6.14, nonfasting blood glucose 120, GFR of 9, WBC 8.0, hemoglobin 10.3, platelets of 360.  Lactic acid was 1.2.  High sensitive troponin was 30.  COVID/influenza A/influenza B PCR were negative.  ED provider consulted nephrology for dialysis continuation while hospitalized.  ED treatment: Sodium chloride 500 mL bolus.  At bedside patient was able to tell me his full name, he states his age is 76, he knows he is in the hospital.  And he knows he is in [place].  He was not able to tell me the current calendar year or the month.  He reports that he is brought to the hospital because he was too weak to walk into the ride to take him to dialysis.  He reports shortness of breath with exertion.  He denies trauma to his person.  He denies chest pain, current shortness of breath, abdominal pain, diarrhea, syncope, loss of consciousness, nausea, vomiting.  He denies swelling of his lower extremity.  He states he still urinates about once  to twice per day.  He denies dysuria, hematuria.  He denies known fever, cough.  He reports he lives in a group home and states he used to be incarcerated.  When I ask if there is a family member he would like me to call to let them know that he is here he states that his grandmother and mother are dead.  He has a brother but does not know where his brother is and has not been in contact with his brother.  Social history: He lives in a group home.  He currently smokes about 5 to 6 cigarettes/day.  He denies EtOH and recreational drug use.  ROS: Constitutional: no weight change, no fever ENT/Mouth: no sore throat, no rhinorrhea Eyes: no eye pain, no vision changes Cardiovascular: no chest pain, no dyspnea,  no edema, no palpitations Respiratory: no cough, no sputum, no wheezing Gastrointestinal: no nausea, no vomiting, no diarrhea, no constipation Genitourinary: no urinary incontinence, no dysuria, no hematuria Musculoskeletal: no arthralgias, no myalgias Skin: no skin lesions, no pruritus, Neuro: + weakness, no loss of consciousness, no syncope Psych: no anxiety, no depression, + decrease appetite Heme/Lymph: no bruising, no bleeding  ED Course: Discussed with him since he medicine provider, patient requiring hospitalization for chief concerns of weakness, dyspnea and hypotension on initial presentation.  Assessment/Plan  Principal Problem:   Shortness of breath Active Problems:   Schizophrenia ([place])   Hypertension   Tobacco abuse   Type 2 diabetes mellitus without complication, without long-term current use of insulin (HCC)   Hyperlipidemia   Benign prostatic hyperplasia with lower urinary  tract symptoms   Weakness   Assessment and Plan:  * Shortness of breath - Etiology work-up in progress at this time - Check procalcitonin - Strict I's and O's - Nephrology has been consulted by EDP for hemodialysis - Admit to telemetry medical, inpatient  Weakness - Etiology work-up in  progress at this time - Differential diagnosis include B12 deficiency, debility, exercise intolerance, infectious etiology - Check procalcitonin, B12, fall precautions - PT, OT, TOC consult  Hyperlipidemia - Atorvastatin 20 mg daily  Type 2 diabetes mellitus without complication, without long-term current use of insulin (HCC) - Actos not resumed on admission - Insulin glargine/long-acting 10 units daily resumed - Insulin SSI with agents coverage ordered  Tobacco abuse - As needed nicotine patch ordered for nicotine craving  Hypertension - Losartan 100 mg daily is a home medication, amlodipine 2.5 mg daily, clonidine 0.1 mg tablet unclear if it is daily or twice daily  Schizophrenia ([place]) - Olanzapine 50 mg daily  AM team to complete med reconciliation as the last outpatient note for her home medication was in July 2023 with nephrology - Pharmacy tech is awaiting fax from group home  CODE STATUS-patient was awake alert and oriented to himself, age, his current location.  He was not able to tell me the current year or the current month though he was able to tell me that he was too weak to get into the ride taking him to hemodialysis. - He states that if his heart stopped beating, that means he is dying and there is nothing you can do and he does not want to be resuscitated - I clarified several times in many different ways and the answer remains the same that he did not want chest compressions/resuscitative measures  Chart reviewed.   DVT prophylaxis: Heparin 5000 units subcutaneous every 8 hours Code Status: DNR Diet: Renal Family Communication: No, a phone call was offered, patient declined stating that he does not really have any family members.  His grandmother and mother are dead.  And he has not been in touch with his brother for many years. Disposition Plan: Pending clinical course Consults called: Nephrology Admission status: Inpatient medical, telemetry  Past Medical  History:  Diagnosis Date   Alcoholism ([place])    Allergy    Altered mental status 2011   hx of 2/2 hypercalcemia, EtOH w/d and medication overuse   Angina    mild pain on arm, left chest, moves down left chest   BPH (benign prostatic hyperplasia)    Chronic kidney disease, stage 4 (severe) (HCC)    Chronic renal insufficiency    with history of acute on chronic secondary to dehydration and hypercalcemia   Dementia ([place])    Encephalopathy    Fx humeral neck 2008   right side, no history of surgical repair   History of nephrolithiasis 2008   s/p ureterolithotomy    Hypercalcemia    2/2 primary hyperparathyroidism   Hyperkalemia    Hypertension    Pancreatitis 2011   history of several admissions for pancreatitis    Parathyroid adenoma    Primary hyperparathyroidism ([place])    s/p parathyroid adenoma resection Oct. 8, 2011, repeat scan showed residual right adenoma, however pt did not f/u with surgery as outpt   Schizophrenia ([place])    previously on zyprexa   Tobacco abuse    Type 2 diabetes mellitus with chronic kidney disease ([place])    Past Surgical History:  Procedure Laterality Date   Ankle Pinning  AV FISTULA PLACEMENT Left 08/17/2022   Procedure: INSERTION OF ARTERIOVENOUS (AV) GORE-TEX GRAFT ARM (BRACHIAL AXILLARY);  Surgeon: Algernon Huxley, MD;  Location: [facility];  Service: Vascular;  Laterality: Left;   cysto, removal of bladder stone, attempted stent placement  08/15/2007   cystoscopy, laser cystolithoplaxy  08/16/2007   DIALYSIS/PERMA CATHETER INSERTION N/A 07/04/2022   Procedure: DIALYSIS/PERMA CATHETER INSERTION;  Surgeon: Algernon Huxley, MD;  Location: [facility];  Service: Cardiovascular;  Laterality: N/A;   KIDNEY STONE SURGERY  07/2007 X2   /E-chart   open left proximal ureterolithotomy  09/18/2007   parathyroid adenoma resection  09/04/2010   residual adenoma Oct. 12, 2011   PARATHYROIDECTOMY  03/09/2012   Procedure: PARATHYROIDECTOMY;  Surgeon: Pedro Earls, MD;  Location: [facility];  Service: General;  Laterality: N/A;  mediastinal paraqthyroidectomy    THYROIDECTOMY  03/09/2012   Social History:  reports that he has been smoking cigarettes. He has a 50.00 pack-year smoking history. He has never used smokeless tobacco. He reports that he does not drink alcohol and does not use drugs.  No Known Allergies Family History  Problem Relation Age of Onset   Kidney cancer Neg Hx    Bladder Cancer Neg Hx    Prostate cancer Neg Hx    Family history: Family history reviewed and not pertinent  Prior to Admission medications   Medication Sig Start Date End Date Taking? Authorizing Provider  amLODipine (NORVASC) 5 MG tablet Take 5 mg by mouth daily.    [provider]  atorvastatin (LIPITOR) 20 MG tablet Take 1 tablet (20 mg total) by mouth daily. 03/02/17   Juline Patch, MD  calcitRIOL (ROCALTROL) 0.5 MCG capsule Take 1 capsule (0.5 mcg total) by mouth daily. 01/20/19   Dustin Flock, MD  cinacalcet (SENSIPAR) 30 MG tablet Take 30 mg by mouth daily.    [provider]  cloNIDine (CATAPRES) 0.1 MG tablet Take 0.1 mg by mouth 2 (two) times daily. 05/20/22   [provider]  denosumab (PROLIA) 60 MG/ML SOSY injection Inject 1 mL into the skin every 6 (six) months. 11/23/18   [provider]  docusate sodium (COLACE) 100 MG capsule Take 1 capsule (100 mg total) by mouth 2 (two) times daily. Patient taking differently: Take 100 mg by mouth 2 (two) times daily as needed. 03/02/17   Juline Patch, MD  doxazosin (CARDURA) 2 MG tablet Take 2 mg by mouth at bedtime.    [provider]  ergocalciferol (VITAMIN D2) 50000 units capsule Take 1 capsule (50,000 Units total) by mouth every 6 (six) weeks. 03/02/17   Juline Patch, MD  finasteride (PROSCAR) 5 MG tablet TAKE ONE TABLET BY MOUTH EVERY DAY FOR ENLARGED PROSTATE Patient taking differently: Take 5 mg by mouth daily. TAKE ONE TABLET BY MOUTH EVERY DAY FOR  ENLARGED PROSTATE 08/18/20   Ernestine Conrad, Larene Beach A, PA-C  fluticasone (FLONASE) 50 MCG/ACT nasal spray USE 2 SPRAYS IN EACH NOSTRIL DAILY AS NEEDED Patient taking differently: Place 1 spray into both nostrils daily. 03/02/17   Juline Patch, MD  folic acid (FOLVITE) 1 MG tablet Take 1 tablet (1 mg total) by mouth daily. 03/02/17   Juline Patch, MD  gabapentin (NEURONTIN) 400 MG capsule Take 400 mg by mouth 2 (two) times daily.    [provider]  glucose blood test strip 1 each by Other route 2 (two) times daily. Use as instructed 10/05/16   Juline Patch, MD  HYDROcodone-acetaminophen (NORCO/VICODIN) 5-325 MG tablet Take 2 tablets by mouth every 6 (six) hours as needed for moderate pain. 08/17/22 08/17/23  Algernon Huxley, MD  Insulin Glargine (LANTUS SOLOSTAR) 100 UNIT/ML Solostar Pen Inject 10 Units into the skin Nightly.    [provider]  losartan (COZAAR) 100 MG tablet Take 100 mg by mouth daily. 05/20/22   [provider]  MELATONIN TR 1 MG TBCR TAKE 1 TABLET BY MOUTH EVERY NIGHT AT BEDTIME 05/17/17   Juline Patch, MD  Multiple Vitamins-Minerals (MULTIVITAMIN WITH MINERALS) tablet TAKE 1 TABLET BY MOUTH DAILY 04/17/17   Juline Patch, MD  OLANZapine (ZYPREXA) 15 MG tablet Take 15 mg by mouth at bedtime. 01/27/16   [provider]  pioglitazone (ACTOS) 30 MG tablet Take 30 mg by mouth daily. 05/01/18   [provider]  sulfamethoxazole-trimethoprim (BACTRIM) 400-80 MG tablet Take 1 tablet by mouth 2 (two) times daily. 09/29/22   Kris Hartmann, NP  thiamine (VITAMIN B-1) 100 MG tablet Take 1 tablet (100 mg total) by mouth daily. 03/02/17   Juline Patch, MD  traZODone (DESYREL) 100 MG tablet Take 100 mg by mouth at bedtime.    [provider]  TRUEPLUS LANCETS 26G MISC 1 each by Does not apply route 2 (two) times daily. 10/05/16   Juline Patch, MD   Physical Exam: Vitals:   09/30/22 1600 09/30/22 1630 09/30/22 1700 09/30/22 1725  BP: 120/62  114/61  117/67  Pulse: 81  86 87  Resp: '14 17 14 16  '$ Temp:    98.8 F (37.1 C)  TempSrc:    Oral  SpO2: 97%  93% 100%  Weight:      Height:       Constitutional: appears frail, cachectic, NAD, calm, comfortable Eyes: PERRL, lids and conjunctivae normal ENMT: Mucous membranes are moist. Posterior pharynx clear of any exudate or lesions. Age-appropriate dentition.  Moderate bilateral hearing loss Neck: normal, supple, no masses, no thyromegaly Respiratory: clear to auscultation bilaterally, no wheezing, no crackles. Normal respiratory effort. No accessory muscle use.  Cardiovascular: Regular rate and rhythm, no murmurs / rubs / gallops. No extremity edema. 2+ pedal pulses. No carotid bruits.  Abdomen: no tenderness, no masses palpated, no hepatosplenomegaly. Bowel sounds positive.  Musculoskeletal: no clubbing / cyanosis. No joint deformity upper and lower extremities. Good ROM, no contractures, no atrophy. Normal muscle tone.  Skin: no rashes, lesions, ulcers. No induration Neurologic: Sensation intact. Strength 5/5 in all 4.  Psychiatric: Normal judgment and insight. Alert and oriented x 3. Normal mood.   EKG: independently reviewed, showing sinus rhythm with rate of 82, QTc 435  Chest x-ray on Admission: I personally reviewed and I agree with radiologist reading as below.  DG Chest Port 1 View  Result Date: 09/30/2022 CLINICAL DATA:  Weakness per ordering notes. Patient unable to give adequate history at this time. Per RN- patient taken to decon shower due to bed bugs. EXAM: PORTABLE CHEST - 1 VIEW COMPARISON:  07/14/2022 FINDINGS: Right IJ hemodialysis catheter to the distal SVC as before. Slightly lower lung volumes with prominent coarse interstitial markings in the lung bases as before. No new airspace disease. Heart size upper limits normal. No effusion. Posttraumatic change in the left humeral head. IMPRESSION: No acute cardiopulmonary disease. Electronically Signed   By: Lucrezia Europe  M.D.   On: 09/30/2022 12:28    Labs on Admission: I have personally reviewed following labs  CBC: Recent Labs  Lab 09/30/22  1407  WBC 8.0  NEUTROABS 6.4  HGB 10.3*  HCT 32.8*  MCV 95.9  PLT 445   Basic Metabolic Panel: Recent Labs  Lab 09/30/22 1407  NA 144  K 4.6  CL 106  CO2 27  GLUCOSE 120*  BUN 40*  CREATININE 6.14*  CALCIUM 9.1   GFR: Estimated Creatinine Clearance: 10.4 mL/min (A) (by C-G formula based on SCr of 6.14 mg/dL (H)).  Liver Function Tests: Recent Labs  Lab 09/30/22 1407  AST 32  ALT 21  ALKPHOS 59  BILITOT 0.8  PROT 7.5  ALBUMIN 3.1*   Urine analysis:    Component Value Date/Time   COLORURINE YELLOW (A) 09/30/2022 1206   APPEARANCEUR CLEAR (A) 09/30/2022 1206   APPEARANCEUR Cloudy (A) 08/18/2020 1129   LABSPEC 1.012 09/30/2022 1206   PHURINE 8.0 09/30/2022 1206   GLUCOSEU NEGATIVE 09/30/2022 1206   HGBUR NEGATIVE 09/30/2022 1206   BILIRUBINUR NEGATIVE 09/30/2022 1206   BILIRUBINUR Negative 08/18/2020 1129   KETONESUR NEGATIVE 09/30/2022 1206   PROTEINUR 100 (A) 09/30/2022 1206   UROBILINOGEN 0.2 04/24/2013 1236   NITRITE NEGATIVE 09/30/2022 1206   LEUKOCYTESUR NEGATIVE 09/30/2022 1206   Dr. Tobie Poet [facility]  If 7PM-7AM, please contact overnight-coverage provider If 7AM-7PM, please contact day coverage provider [email or web address]  09/30/2022, 6:10 PM

## 2022-09-30 NOTE — Assessment & Plan Note (Signed)
-   Etiology work-up in progress at this time - Check procalcitonin - Strict I's and O's - Nephrology has been consulted by EDP for hemodialysis - Admit to telemetry medical, inpatient

## 2022-09-30 NOTE — Assessment & Plan Note (Signed)
-   Losartan 100 mg daily is a home medication, amlodipine 2.5 mg daily, clonidine 0.1 mg tablet unclear if it is daily or twice daily

## 2022-09-30 NOTE — Assessment & Plan Note (Addendum)
-   Actos not resumed on admission - Insulin glargine/long-acting 10 units daily resumed - Insulin SSI with agents coverage ordered

## 2022-09-30 NOTE — Assessment & Plan Note (Signed)
-   Etiology work-up in progress at this time - Differential diagnosis include B12 deficiency, debility, exercise intolerance, infectious etiology - Check procalcitonin, B12, fall precautions - PT, OT, TOC consult

## 2022-09-30 NOTE — Assessment & Plan Note (Signed)
-   As needed nicotine patch ordered for nicotine craving 

## 2022-10-01 DIAGNOSIS — F2089 Other schizophrenia: Secondary | ICD-10-CM | POA: Diagnosis not present

## 2022-10-01 DIAGNOSIS — N186 End stage renal disease: Secondary | ICD-10-CM | POA: Diagnosis not present

## 2022-10-01 DIAGNOSIS — R0602 Shortness of breath: Secondary | ICD-10-CM | POA: Diagnosis not present

## 2022-10-01 LAB — CBC
HCT: 29.3 % — ABNORMAL LOW (ref 39.0–52.0)
HCT: 31.7 % — ABNORMAL LOW (ref 39.0–52.0)
Hemoglobin: 9.1 g/dL — ABNORMAL LOW (ref 13.0–17.0)
Hemoglobin: 10 g/dL — ABNORMAL LOW (ref 13.0–17.0)
MCH: 29.4 pg (ref 26.0–34.0)
MCH: 29.9 pg (ref 26.0–34.0)
MCHC: 31.1 g/dL (ref 30.0–36.0)
MCHC: 31.5 g/dL (ref 30.0–36.0)
MCV: 94.5 fL (ref 80.0–100.0)
MCV: 94.9 fL (ref 80.0–100.0)
Platelets: 339 10*3/uL (ref 150–400)
Platelets: 339 10*3/uL (ref 150–400)
RBC: 3.1 MIL/uL — ABNORMAL LOW (ref 4.22–5.81)
RBC: 3.34 MIL/uL — ABNORMAL LOW (ref 4.22–5.81)
RDW: 14.9 % (ref 11.5–15.5)
RDW: 14.7 % (ref 11.5–15.5)
WBC: 7.3 10*3/uL (ref 4.0–10.5)
WBC: 7.7 10*3/uL (ref 4.0–10.5)
nRBC: 0 % (ref 0.0–0.2)
nRBC: 0 % (ref 0.0–0.2)

## 2022-10-01 LAB — RENAL FUNCTION PANEL
Albumin: 2.8 g/dL — ABNORMAL LOW (ref 3.5–5.0)
Anion gap: 10 (ref 5–15)
BUN: 48 mg/dL — ABNORMAL HIGH (ref 8–23)
CO2: 27 mmol/L (ref 22–32)
Calcium: 8.7 mg/dL — ABNORMAL LOW (ref 8.9–10.3)
Chloride: 104 mmol/L (ref 98–111)
Creatinine, Ser: 6.96 mg/dL — ABNORMAL HIGH (ref 0.61–1.24)
GFR, Estimated: 8 mL/min — ABNORMAL LOW (ref >60)
Glucose, Bld: 221 mg/dL — ABNORMAL HIGH (ref 70–99)
Phosphorus: 3.2 mg/dL (ref 2.5–4.6)
Potassium: 4.7 mmol/L (ref 3.5–5.1)
Sodium: 141 mmol/L (ref 135–145)

## 2022-10-01 LAB — GLUCOSE, CAPILLARY
Glucose-Capillary: 118 mg/dL — ABNORMAL HIGH (ref 70–99)
Glucose-Capillary: 184 mg/dL — ABNORMAL HIGH (ref 70–99)
Glucose-Capillary: 163 mg/dL — ABNORMAL HIGH (ref 70–99)
Glucose-Capillary: 192 mg/dL — ABNORMAL HIGH (ref 70–99)

## 2022-10-01 LAB — BASIC METABOLIC PANEL
Anion gap: 8 (ref 5–15)
BUN: 46 mg/dL — ABNORMAL HIGH (ref 8–23)
CO2: 26 mmol/L (ref 22–32)
Calcium: 8.8 mg/dL — ABNORMAL LOW (ref 8.9–10.3)
Chloride: 107 mmol/L (ref 98–111)
Creatinine, Ser: 6.73 mg/dL — ABNORMAL HIGH (ref 0.61–1.24)
GFR, Estimated: 8 mL/min — ABNORMAL LOW (ref >60)
Glucose, Bld: 135 mg/dL — ABNORMAL HIGH (ref 70–99)
Potassium: 4.4 mmol/L (ref 3.5–5.1)
Sodium: 141 mmol/L (ref 135–145)

## 2022-10-01 LAB — HEMOGLOBIN A1C
Hgb A1c MFr Bld: 5.9 % — ABNORMAL HIGH (ref 4.8–5.6)
Mean Plasma Glucose: 122.63 mg/dL

## 2022-10-01 LAB — PROCALCITONIN: Procalcitonin: 20.82 ng/mL

## 2022-10-01 LAB — HEPATITIS B SURFACE ANTIGEN: Hepatitis B Surface Ag: NONREACTIVE

## 2022-10-01 LAB — VITAMIN B12: Vitamin B-12: 2346 pg/mL — ABNORMAL HIGH (ref 180–914)

## 2022-10-01 MED ORDER — PENTAFLUOROPROP-TETRAFLUOROETH EX AERO: 1.0000 | INHALATION_SPRAY | CUTANEOUS | Status: DC | PRN

## 2022-10-01 MED ORDER — LIDOCAINE HCL (PF) 1 % IJ SOLN: 5.0000 mL | INTRAMUSCULAR | Status: DC | PRN

## 2022-10-01 MED ORDER — CHLORHEXIDINE GLUCONATE CLOTH 2 % EX PADS
6.0000 | MEDICATED_PAD | Freq: Every day | CUTANEOUS | Status: DC
  Administered 2022-10-01 – 2022-10-02 (×2): 6 via TOPICAL

## 2022-10-01 MED ORDER — ERGOCALCIFEROL 1.25 MG (50000 UT) PO CAPS: 50000.0000 [IU] | ORAL_CAPSULE | ORAL | Status: DC

## 2022-10-01 MED ORDER — LIDOCAINE-PRILOCAINE 2.5-2.5 % EX CREA: 1.0000 | TOPICAL_CREAM | CUTANEOUS | Status: DC | PRN

## 2022-10-01 MED ORDER — CINACALCET HCL 30 MG PO TABS
30.0000 mg | ORAL_TABLET | Freq: Every day | ORAL | Status: DC
  Administered 2022-10-01 – 2022-10-02 (×2): 30 mg via ORAL
  Filled 2022-10-01 (×2): qty 1

## 2022-10-01 MED ORDER — CIPROFLOXACIN HCL 500 MG PO TABS
500.0000 mg | ORAL_TABLET | Freq: Every day | ORAL | Status: DC
  Administered 2022-10-01 – 2022-10-02 (×2): 500 mg via ORAL
  Filled 2022-10-01 (×2): qty 1

## 2022-10-01 MED ORDER — OLANZAPINE 7.5 MG PO TABS
15.0000 mg | ORAL_TABLET | Freq: Every day | ORAL | Status: DC
  Administered 2022-10-01: 15 mg via ORAL
  Filled 2022-10-01: qty 2

## 2022-10-01 MED ORDER — ANTICOAGULANT SODIUM CITRATE 4% (200MG/5ML) IV SOLN: 5.0000 mL | Status: DC | PRN

## 2022-10-01 MED ORDER — HEPARIN SODIUM (PORCINE) 1000 UNIT/ML DIALYSIS: 1000.0000 [IU] | INTRAMUSCULAR | Status: DC | PRN

## 2022-10-01 MED ORDER — ALTEPLASE 2 MG IJ SOLR: 2.0000 mg | Freq: Once | INTRAMUSCULAR | Status: DC | PRN

## 2022-10-01 MED ORDER — GABAPENTIN 300 MG PO CAPS
300.0000 mg | ORAL_CAPSULE | Freq: Every day | ORAL | Status: DC
  Administered 2022-10-01: 300 mg via ORAL
  Filled 2022-10-01: qty 1

## 2022-10-01 MED ORDER — CALCITRIOL 0.25 MCG PO CAPS
0.5000 ug | ORAL_CAPSULE | Freq: Every day | ORAL | Status: DC
  Administered 2022-10-01 – 2022-10-02 (×2): 0.5 ug via ORAL
  Filled 2022-10-01 (×2): qty 2

## 2022-10-01 MED ORDER — CHLORHEXIDINE GLUCONATE CLOTH 2 % EX PADS
6.0000 | MEDICATED_PAD | Freq: Every day | CUTANEOUS | Status: DC
  Administered 2022-10-02: 6 via TOPICAL

## 2022-10-01 MED ORDER — CLONIDINE HCL 0.1 MG PO TABS
0.1000 mg | ORAL_TABLET | Freq: Two times a day (BID) | ORAL | Status: DC
  Administered 2022-10-01: 0.1 mg via ORAL
  Filled 2022-10-01 (×3): qty 1

## 2022-10-01 NOTE — TOC Initial Note (Addendum)
Transition of Care [facility]) - Initial/Assessment Note    Patient Details  Name: [name] MRN: [id number] Date of Birth: [birth year]  [facility] of [facility]) CM/SW Contact:    Magnus Ivan, LCSW Phone Number: 10/01/2022, 12:32 PM  Clinical Narrative:                 Patient to DC home tomorrow pending blood cultures. PT rec HH and RW.  CSW left VM for Legal Guardian [name] requesting return call.  CSW called Timmy Stann [facility] ([place] owner). Timmy confirmed patient can DC back there tomorrow if medically ready. Timmy stated the address is [address], [place],  [address]. CSW updated address in chart. Timmy confirmed patient needs a RW to be ordered and agreeable to HHPT being arranged. PCP is Dr. Daphane Shepherd. Pharmacy is [facility] in [place]. Timmy stated he will also update Legal Guardian Elmyra Ricks as he spoke with her last night. Timmy stated the group home can pay for an Melburn Popper to pick patient up when medically ready tomorrow, staff will just need to call Timmy and let him know when to arrange it. HH referral accepted by Corene Cornea with [facility]. RW referral made to [facility] with [facility] for delivery to bedside tomorrow morning prior to DC tomorrow.    3:45- Notified by UR of Code 44. Called on call [facility] line. Spoke to [place] with their answering services who stated he will request on call DSS Worker to call CSW back.    3:55- Call from [place], [place] Worker on call. Informed her of plan for DC tomorrow with HHPT and of Code 44. She requested CSW fax copy of Code 44 to [place] at [phone].   Expected Discharge Plan: Group Home (home health) Barriers to Discharge: Continued Medical Work up   Patient Goals and CMS Choice   CMS Medicare.gov Compare Post Acute Care list provided to:: Legal Guardian Choice offered to / list presented to : [place] / Guardian  Expected Discharge Plan and Services Expected Discharge Plan: Group Home (home health)        Living arrangements for the past 2 months: Group Home                 DME Arranged: [place] rolling DME Agency: [facility] Date DME Agency Contacted: 10/01/22   Representative spoke with at DME Agency: [place]: PT Jess[place]: [place] ([place]) Date [place]: 10/01/22   Representative spoke with at [place]: Corene Cornea  Prior Living Arrangements/Services Living arrangements for the past 2 months: [place] with:: Facility Resident Patient language and need for interpreter reviewed:: Yes              Criminal Activity/Legal Involvement Pertinent to Current Situation/Hospitalization: No - Comment as needed  Activities of Daily Living      Permission Sought/Granted                  Emotional Assessment         Alcohol / Substance Use: Not Applicable Psych Involvement: No (comment)  Admission diagnosis:  Shortness of breath [R06.02] Weakness [R53.1] ESRD (end stage renal disease) ([place]) [N18.6] Hypotension, unspecified hypotension type [I95.9] Patient Active Problem List   Diagnosis Date Noted   Shortness of breath 09/30/2022   Weakness 09/30/2022   Hypocalcemia 01/17/2019   History of hip fracture 12/15/2018   Secondary hyperparathyroidism of renal origin ([place]) 07/09/2018   Tobacco dependence 07/09/2018   Uncontrolled  type 2 diabetes mellitus with hyperglycemia, without long-term current use of insulin ([place]) 05/17/2017   Hyperglycemia 60/63/0160   Folic acid deficiency 10/93/2355   Chronic constipation 03/02/2017   Thiamine deficiency 03/02/2017   Chronic seasonal allergic rhinitis due to pollen 03/02/2017   Type 2 diabetes mellitus without complication, without long-term current use of insulin ([place]) 03/02/2017   Lives in long-term care facility 03/02/2017   Hyperlipidemia 03/02/2017   Vitamin D deficiency 03/02/2017   Anemia 03/02/2017   Osteoporosis 07/07/2015   Benign prostatic hyperplasia with lower  urinary tract symptoms 02/09/2015   Encephalopathy, metabolic 73/22/0254   CKD (chronic kidney disease) stage 4, GFR 15-29 ml/min (HCC) 01/05/2012   Tobacco abuse 27/04/2375   Metabolic acidosis 28/31/5176   Pancreatitis, hx of  11/09/2011   Schizophrenia ([place]) 11/09/2011   History of nephrolithiasis 11/09/2011   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 [place] [address] Phone: [phone] Fax: [phone]     Social Determinants of Health ([place]) Interventions    Readmission Risk Interventions    10/01/2022   12:29 PM  Readmission Risk Prevention Plan  Transportation Screening Complete  PCP or Specialist Appt within 3-5 Days Complete  HRI or [place] Complete  Social Work Consult for [place] Planning/Counseling Complete  Palliative Care Screening Not Applicable  Medication Review Press photographer) Complete

## 2022-10-01 NOTE — Progress Notes (Signed)
OT Cancellation Note  Patient Details Name: [name] MRN: [id number] DOB: [birth year]   Cancelled Treatment:    Reason Eval/Treat Not Completed: Other (comment) (pt off floor at HD, OT will reattempt as able)  Shanon Payor, OTD OTR/L  10/01/22, 2:57 PM

## 2022-10-01 NOTE — Progress Notes (Addendum)
  Progress Note   Patient: [name] TMA:[id number] DOB: 18-Apr-1946 DOA: 09/30/2022     1 DOS: the patient was seen and examined on 10/01/2022   Brief hospital course: 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 Patient missed his dialysis yesterday.  Lab test showed no leukocytosis, UA does not suggest UTI.  Chest x-ray did not show pneumonia.  Skin has no cellulitis.  However, initial blood draw at admission showed procalcitonin level of 20.82.  Blood culture sent out.  Assessment and Plan: End-stage renal disease Short of breath secondary to missed dialysis. General weakness secondary to missed dialysis. Discussed with nephrology, patient will receiving hemodialysis today.  Elevated procalcitonin level. Left arm wound. Reviewed previous notes from vascular surgery on 10/18, patient had a wound at that time, appears to be dehiscence from prior incision.  AV fistula in that arm.  There was some draining at that time, culture was sent out, came back with Enterobacter Colace. I examined him today, does not have a cellulitis, still has a small wound with minimal draining.  It appears to be the same now as being described before. Due to profound elevation of procalcitonin level, I will check a blood culture make sure patient does not develop bacteremia. For now, I will cover with oral Cipro.  Type 2 diabetes Resume home regimen.  Essential hypertension. Continue to follow-up  Schizophrenia Continue olanzapine.      Subjective:  Patient short of breath has resolved.  No cough. No abdominal pain nausea vomiting or diarrhea  He still making urine, does not feel any dysuria or hematuria. Physical Exam: Vitals:    09/30/22 1725 09/30/22 1905 10/01/22 0524 10/01/22 0816  BP: 117/67 122/87 (!) 107/54 (!) 119/47  Pulse: 87 84 77 72  Resp: '16 20 16 16  '$ Temp: 98.8 F (37.1 C) 98.4 F (36.9 C) 97.6 F (36.4 C) 98 F (36.7 C)  TempSrc: Oral Oral Oral Oral  SpO2: 100% 100% 95% 96%  Weight:      Height:       General exam: Appears calm and comfortable  Respiratory system: Clear to auscultation. Respiratory effort normal. Cardiovascular system: S1 & S2 heard, RRR. No JVD, murmurs, rubs, gallops or clicks. No pedal edema. Gastrointestinal system: Abdomen is nondistended, soft and nontender. No organomegaly or masses felt. Normal bowel sounds heard. Central nervous system: Alert and oriented x2. No focal neurological deficits. Extremities: Left arm still has small wound, probably less than 1 cm.  No skin redness.  Small amount draining. Skin: No rashes, lesions or ulcers Psychiatry: Mood & affect appropriate.   Data Reviewed:  Lab results reviewed.  Family Communication: none  Disposition: Status is: Inpatient Remains inpatient appropriate because: Severity of disease.  Planned Discharge Destination: Home with Home Health    Time spent: 35 minutes  Author: Sharen Hones, MD 10/01/2022 12:47 PM  For on call review [email or web address].

## 2022-10-01 NOTE — Progress Notes (Signed)
PT did 3 hrs of tx, tolerated well PT kept moving which prolonged the tx.   UF = 900 ml    10/01/22 1720  Vitals  Temp 97.8 F (36.6 C)  Temp Source Oral  BP (!) 138/97  MAP (mmHg) 112  BP Location Right Arm  BP Method Automatic  Patient Position (if appropriate) Lying  Pulse Rate 80  Pulse Rate Source Monitor  ECG Heart Rate 80  Resp 19  Oxygen Therapy  SpO2 98 %  O2 Device Room Air  Patient Activity (if Appropriate) In bed  Pulse Oximetry Type Continuous  Post Treatment  Dialyzer Clearance Heavily streaked  Duration of HD Treatment -hour(s) 3 hour(s)  Hemodialysis Intake (mL) 0 mL  Liters Processed 54  Fluid Removed 900 mL  Tolerated HD Treatment Yes  Post-Hemodialysis Comments PT tolerated tx well, pt kept moving which prolonged the tx  Hemodialysis Catheter Right Internal jugular Double lumen Permanent (Tunneled)  Placement Date/Time: 07/04/22 1247   Time Out: Correct patient;Correct site;Correct procedure  Maximum sterile barrier precautions: Hand hygiene;Cap;Sterile gloves;Large sterile sheet;Mask;Sterile gown  Site Prep: Chlorhexidine (preferred);Skin Prep C...  Site Condition No complications  Blue Lumen Status Flushed;Dead end cap in place;Heparin locked  Red Lumen Status Flushed;Dead end cap in place;Heparin locked  Purple Lumen Status N/A  Catheter fill solution Heparin 1000 units/ml  Dressing Type Transparent  Dressing Status Clean, Dry, Intact  Drainage Description None  Post treatment catheter status Capped and Clamped

## 2022-10-01 NOTE — Progress Notes (Signed)
[facility]  PROGRESS NOTE   Subjective:   Patient sitting out of bed to chair.  Patient states he missed his dialysis yesterday.  Objective:  Vital signs: Blood pressure (!) 119/47, pulse 72, temperature 98 F (36.7 C), temperature source Oral, resp. rate 16, height '5\' 9"'$  (1.753 m), weight 75.7 kg, SpO2 96 %.  Intake/Output Summary (Last 24 hours) at 10/01/2022 1338 Last data filed at 10/01/2022 0500 Gross per 24 hour  Intake --  Output 400 ml  Net -400 ml   Filed Weights   09/30/22 1145  Weight: 75.7 kg     Physical Exam: General:  No acute distress  Head:  Normocephalic, atraumatic. Moist oral mucosal membranes  Eyes:  Anicteric  Neck:  Supple  Lungs:   Clear to auscultation, normal effort  Heart:  S1S2 no rubs  Abdomen:   Soft, nontender, bowel sounds present  Extremities:  peripheral edema.  Neurologic:  Awake, alert, following commands  Skin:  No lesions  Access:     Basic Metabolic Panel: Recent Labs  Lab 09/30/22 1407 10/01/22 0518 10/01/22 1237  NA 144 141 141  K 4.6 4.4 4.7  CL 106 107 104  CO2 '27 26 27  '$ GLUCOSE 120* 135* 221*  BUN 40* 46* 48*  CREATININE 6.14* 6.73* 6.96*  CALCIUM 9.1 8.8* 8.7*  PHOS  --   --  3.2    CBC: Recent Labs  Lab 09/30/22 1407 10/01/22 0518 10/01/22 1237  WBC 8.0 7.3 7.7  NEUTROABS 6.4  --   --   HGB 10.3* 9.1* 10.0*  HCT 32.8* 29.3* 31.7*  MCV 95.9 94.5 94.9  PLT 360 339 339     Urinalysis: Recent Labs    09/30/22 1206  COLORURINE YELLOW*  LABSPEC 1.012  PHURINE 8.0  GLUCOSEU NEGATIVE  HGBUR NEGATIVE  BILIRUBINUR NEGATIVE  KETONESUR NEGATIVE  PROTEINUR 100*  NITRITE NEGATIVE  LEUKOCYTESUR NEGATIVE      Imaging: DG Chest Port 1 View  Result Date: 09/30/2022 CLINICAL DATA:  Weakness per ordering notes. Patient unable to give adequate history at this time. Per RN- patient taken to decon shower due to bed bugs. EXAM: PORTABLE CHEST - 1 VIEW COMPARISON:  07/14/2022 FINDINGS: Right  IJ hemodialysis catheter to the distal SVC as before. Slightly lower lung volumes with prominent coarse interstitial markings in the lung bases as before. No new airspace disease. Heart size upper limits normal. No effusion. Posttraumatic change in the left humeral head. IMPRESSION: No acute cardiopulmonary disease. Electronically Signed   By: Lucrezia Europe M.D.   On: 09/30/2022 12:28     Medications:    anticoagulant sodium citrate     sodium chloride      atorvastatin  20 mg Oral QHS   calcitRIOL  0.5 mcg Oral Daily   Chlorhexidine Gluconate Cloth  6 each Topical Daily   [START ON 10/02/2022] Chlorhexidine Gluconate Cloth  6 each Topical Q0600   cinacalcet  30 mg Oral Q breakfast   ciprofloxacin  500 mg Oral Daily   cloNIDine  0.1 mg Oral BID   finasteride  5 mg Oral Daily   gabapentin  300 mg Oral q1800   heparin  5,000 Units Subcutaneous Q8H   insulin aspart  0-5 Units Subcutaneous QHS   insulin aspart  0-6 Units Subcutaneous TID WC   insulin glargine-yfgn  10 Units Subcutaneous Daily   OLANZapine  15 mg Oral QHS   traZODone  100 mg Oral QHS    Assessment/ Plan:  Principal Problem:   Shortness of breath Active Problems:   Schizophrenia ([place])   Hypertension   Tobacco abuse   Type 2 diabetes mellitus without complication, without long-term current use of insulin (HCC)   Hyperlipidemia   Benign prostatic hyperplasia with lower urinary tract symptoms   Weakness  76 year old man with history of hypertension, coronary artery disease, congestive heart failure, schizophrenia, end-stage renal disease on hemodialysis, diabetes now admitted with history of missing dialysis Shortness of breath and weakness.  #1: End-stage renal disease: Patient has been on Monday Wednesday Friday schedule.  He missed his dialysis yesterday.  We will arrange for dialysis today.  #2: Diabetes: We will continue the insulin as ordered.  #3: Second hyperparathyroidism: We will continue the Cinacalcet  and also calcitriol.  We will monitor PTH, calcium and phosphorus levels.  #4: Anemia: Anemia is most likely secondary to chronic kidney disease.  We will continue anemia protocol.  We will monitor closely.   LOS: [place], MD [facility] 11/4/20231:38 PM

## 2022-10-01 NOTE — Evaluation (Addendum)
Physical Therapy Evaluation Patient Details Name: [name] MRN: [id number] DOB: [birth year] Today's Date: 10/01/2022  History of Present Illness  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     Initial vitals in the emergency department showed temperature 98.8, respiration rate of 18, heart rate of 80, blood pressure 94/59, SPO2 of 100% on room air. Pt is group home resident.  Clinical Impression  Pt received in bed agreeable to PT evaluation. Pt A  and O to self and place but not situation, time. Poor historian. Today's assessment revealed weakness in BLE requiring CGA with bed mobility, transfers and ambulation in room and in the hallway. Pt tires with 52 ft of ambulation. Pt balanc eis Fair +. Pt is a group home resident and verbalized desire to return there. PT attempted multiple times  to reach the legal guardian to gather information about pt's PLOF and requirements to return without response. PT will continue in Acute care and pt will benefit form HHPT to return to PLOF.  without response. PT continue         Recommendations for follow up therapy are one component of a multi-disciplinary discharge planning process, led by the attending physician.  Recommendations may be updated based on patient status, additional functional criteria and insurance authorization.  Follow Up Recommendations Home health PT      Assistance Recommended at Discharge Intermittent Supervision/Assistance  Patient can return home with the following  A little help with walking and/or transfers;A little help with bathing/dressing/bathroom;Assistance with cooking/housework;Direct supervision/assist for medications management;Direct supervision/assist  for financial management;Assist for transportation    Equipment Recommendations Rolling walker (2 wheels)  Recommendations for Other Services       Functional Status Assessment Patient has had a recent decline in their functional status and demonstrates the ability to make significant improvements in function in a reasonable and predictable amount of time.     Precautions / Restrictions Precautions Precautions: Fall Restrictions Weight Bearing Restrictions: No      Mobility  Bed Mobility Overal bed mobility: Needs Assistance Bed Mobility: Supine to Sit     Supine to sit: Min guard          Transfers Overall transfer level: Needs assistance Equipment used: Rolling walker (2 wheels) Transfers: Sit to/from Stand, Bed to chair/wheelchair/BSC Sit to Stand: Min guard   Step pivot transfers: Min guard       General transfer comment: slow    Ambulation/Gait Ambulation/Gait assistance: Min guard Gait Distance (Feet): 52 Feet Assistive device: Rolling walker (2 wheels) Gait Pattern/deviations: Decreased stride length Gait velocity: dec        Stairs: N/A            Wheelchair Mobility    Modified Rankin (Stroke Patients Only)       Balance Overall balance assessment: Needs assistance Sitting-balance support: Feet unsupported, No upper extremity supported Sitting balance-Leahy Scale: Good     Standing balance support: Bilateral upper extremity supported Standing balance-Leahy Scale: Fair Standing balance comment: Weakness contributes to Fall risk.                             Pertinent Vitals/Pain Pain Assessment Pain Assessment: No/denies pain    Home Living Family/patient expects to be discharged to:: Group  home                   Additional Comments: Unable to reach the Legal gardian. Received a return call from the group home.    Prior Function Prior Level of Function : Patient poor historian/Family not available.      Pt Ind with household level ambulation without AD and negotiates 2 steps independently. Group home staff provided assistance with bathing, meds and meals.                  Hand Dominance        Extremity/Trunk Assessment   Upper Extremity Assessment Upper Extremity Assessment: Generalized weakness    Lower Extremity Assessment Lower Extremity Assessment: Generalized weakness       Communication   Communication: No difficulties  Cognition Arousal/Alertness: Awake/alert Behavior During Therapy: WFL for tasks assessed/performed Overall Cognitive Status: History of cognitive impairments - at baseline                                 General Comments: Pt oriented to self and place.        General Comments      Exercises     Assessment/Plan    PT Assessment Patient needs continued PT services  PT Problem List Decreased strength;Decreased activity tolerance;Decreased balance;Decreased cognition       PT Treatment Interventions Gait training;Therapeutic activities;Therapeutic exercise;Balance training;Cognitive remediation;Neuromuscular re-education;Patient/family education    PT Goals (Current goals can be found in the Care Plan section)  Acute Rehab PT Goals Patient Stated Goal: " I want to go back to the group home." PT Goal Formulation: With patient Time For Goal Achievement: 10/15/22 Potential to Achieve Goals: Good    Frequency Min 2X/week     Co-evaluation               AM-PAC PT "6 Clicks" Mobility  Outcome Measure Help needed turning from your back to your side while in a flat bed without using bedrails?: None Help needed moving from lying on your back to sitting on the side of a flat bed without using bedrails?: A Little Help needed moving to and from a bed to a chair (including a wheelchair)?: A Little Help needed standing up from a chair using your arms (e.g., wheelchair or bedside chair)?: A Little Help needed to walk in  hospital room?: A Little Help needed climbing 3-5 steps with a railing? : A Lot 6 Click Score: 18    End of Session Equipment Utilized During Treatment: Gait belt Activity Tolerance: Patient tolerated treatment well Patient left: in chair;with call bell/phone within reach;with chair alarm set Nurse Communication: Mobility status PT Visit Diagnosis: Unsteadiness on feet (R26.81)    Time: 2800-3491 PT Time Calculation (min) (ACUTE ONLY): 20 min   Charges:   PT Evaluation $PT Eval Low Complexity: 1 Low PT Treatments $Gait Training: 8-22 mins       Joaquin Music PT DPT 12:09 PM,10/01/22  Joaquin Music PT DPT 12:22 PM,10/01/22

## 2022-10-01 NOTE — Progress Notes (Signed)
Pt need consent for dialysis. RN attempted to call [name], no answer. Weekend oncall number called; left a message for a call back, awaiting return call.

## 2022-10-01 NOTE — Care Management CC44 (Signed)
Condition Code 44 Documentation Completed  Patient Details  Name: [name] MRN: [id number] Date of Birth: 1946/09/19   Condition Code 44 given:  Yes Patient signature on Condition Code 44 notice:  Yes Documentation of 2 MD's agreement:  Yes Code 44 added to claim:  Yes  Reviewed with on call SW [name]. Faxed copy to assigned Guardianship SW [name].   [place], LCSW 10/01/2022, 3:56 PM

## 2022-10-01 NOTE — Plan of Care (Signed)

## 2022-10-02 DIAGNOSIS — N186 End stage renal disease: Secondary | ICD-10-CM | POA: Diagnosis not present

## 2022-10-02 DIAGNOSIS — F2089 Other schizophrenia: Secondary | ICD-10-CM | POA: Diagnosis not present

## 2022-10-02 DIAGNOSIS — R0602 Shortness of breath: Secondary | ICD-10-CM | POA: Diagnosis not present

## 2022-10-02 LAB — GLUCOSE, CAPILLARY
Glucose-Capillary: 194 mg/dL — ABNORMAL HIGH (ref 70–99)
Glucose-Capillary: 126 mg/dL — ABNORMAL HIGH (ref 70–99)

## 2022-10-02 LAB — CBC
HCT: 27.6 % — ABNORMAL LOW (ref 39.0–52.0)
Hemoglobin: 8.9 g/dL — ABNORMAL LOW (ref 13.0–17.0)
MCH: 30.2 pg (ref 26.0–34.0)
MCHC: 32.2 g/dL (ref 30.0–36.0)
MCV: 93.6 fL (ref 80.0–100.0)
Platelets: 294 10*3/uL (ref 150–400)
RBC: 2.95 MIL/uL — ABNORMAL LOW (ref 4.22–5.81)
RDW: 14.7 % (ref 11.5–15.5)
WBC: 6.9 10*3/uL (ref 4.0–10.5)
nRBC: 0 % (ref 0.0–0.2)

## 2022-10-02 LAB — HEPATITIS B SURFACE ANTIBODY, QUANTITATIVE: Hep B S AB Quant (Post): <3.1 m[IU]/mL — ABNORMAL LOW (ref >9.9)

## 2022-10-02 LAB — PROCALCITONIN: Procalcitonin: 11.46 ng/mL

## 2022-10-02 MED ORDER — CIPROFLOXACIN HCL 500 MG PO TABS: 500.0000 mg | ORAL_TABLET | Freq: Every day | ORAL | 0 refills | Status: AC

## 2022-10-02 NOTE — Evaluation (Signed)
Occupational Therapy Evaluation Patient Details Name: [name] MRN: [id number] DOB: 1946-06-24 Today's Date: 10/02/2022   History of Present Illness Pt is a 76 year old male presenting to the ED with shortness of breath and weakness; PMH significant for end-stage renal disease on hemodialysis MWF, insulin-dependent diabetes mellitus 2, hyperlipidemia, hypertension, neuropathy, depression, anxiety, insomnia   Clinical Impression   Chart reviewed, pt greeted in bed agreeable to OT evaluation. Pt is alert, oriented to self, grossly oriented to place and situation, but not fully oriented  (knows he is not at home, knows he got weak). Per phone conversation with house owner Timm following evaluation pt amb with RW PRN- has good days and bad. Generally MOD I with ADL. He has 24/7 assistance available if needed. Pt presents with deficits in strength, endurance, activity tolerance, balance affecting safe and optimal ADL completion. Recommend HHOT, frequent/constant supervision with SBA during amb with RW when he returns home. Further mobiilty/ADL tasks limited by pt +orthostatic and symptomatic, BP 123/101 HR 84 in sitting, BP 93/46 (MAP 61) HR 98 in standing. Team notified. Pt is left in chair, all needs met. OT will continue to follow acutely.      Recommendations for follow up therapy are one component of a multi-disciplinary discharge planning process, led by the attending physician.  Recommendations may be updated based on patient status, additional functional criteria and insurance authorization.   Follow Up Recommendations  Home health OT    Assistance Recommended at Discharge Frequent or constant Supervision/Assistance  Patient can return home with the following A little help with walking and/or transfers;A little help with bathing/dressing/bathroom (stand by assist at all times with RW due to fall risk)    Functional Status Assessment  Patient has had a recent decline in their  functional status and demonstrates the ability to make significant improvements in function in a reasonable and predictable amount of time.  Equipment Recommendations  BSC/3in1;Wheelchair (measurements OT)    Recommendations for Other Services       Precautions / Restrictions Precautions Precautions: Fall Restrictions Weight Bearing Restrictions: No      Mobility Bed Mobility Overal bed mobility: Needs Assistance Bed Mobility: Supine to Sit     Supine to sit: Supervision          Transfers Overall transfer level: Needs assistance Equipment used: Rolling walker (2 wheels) Transfers: Sit to/from Stand, Bed to chair/wheelchair/BSC Sit to Stand: Min guard                  Balance Overall balance assessment: Needs assistance Sitting-balance support: No upper extremity supported, Feet supported Sitting balance-Leahy Scale: Good     Standing balance support: Bilateral upper extremity supported Standing balance-Leahy Scale: Fair                             ADL either performed or assessed with clinical judgement   ADL Overall ADL's : Needs assistance/impaired     Grooming: Wash/dry hands;Wash/dry face;Set up;Sitting           Upper Body Dressing : Minimal assistance;Sitting   Lower Body Dressing: Minimal assistance   Toilet Transfer: Min guard;Minimal assistance   Toileting- Clothing Manipulation and Hygiene: Minimal assistance;Sit to/from stand       Functional mobility during ADLs: Min guard;Minimal assistance (6', limited due to dizziness)       Vision Patient Visual Report: No change from baseline       Perception  Praxis      Pertinent Vitals/Pain Pain Assessment Pain Assessment: Faces Faces Pain Scale: Hurts little more Pain Location: generalized Pain Descriptors / Indicators: Discomfort Pain Intervention(s): Limited activity within patient's tolerance, Monitored during session, Repositioned     Hand Dominance      Extremity/Trunk Assessment Upper Extremity Assessment Upper Extremity Assessment: Generalized weakness   Lower Extremity Assessment Lower Extremity Assessment: Generalized weakness   Cervical / Trunk Assessment Cervical / Trunk Assessment: Normal   Communication Communication Communication: No difficulties   Cognition Arousal/Alertness: Awake/alert Behavior During Therapy: WFL for tasks assessed/performed Overall Cognitive Status: No family/caregiver present to determine baseline cognitive functioning Area of Impairment: Orientation, Attention, Memory, Following commands, Safety/judgement, Awareness, Problem solving                 Orientation Level: Disoriented to, Time, Situation, Place Current Attention Level: Sustained Memory: Decreased short-term memory Following Commands: Follows one step commands inconsistently Safety/Judgement: Decreased awareness of deficits, Decreased awareness of safety Awareness: Emergent Problem Solving: Slow processing, Difficulty sequencing, Requires verbal cues, Requires tactile cues, Decreased initiation       General Comments       Exercises     Shoulder Instructions      Home Living Family/patient expects to be discharged to:: Group home                                        Prior Functioning/Environment Prior Level of Function : Patient poor historian/Family not available             Mobility Comments: pt reports he is MOD I with RW ADLs Comments: pt reports someone is in bathroom for bathing; generally MOD I with ADL per pt report- provides conflicting informaiton        OT Problem List: Decreased strength;Decreased activity tolerance;Decreased knowledge of use of DME or AE      OT Treatment/Interventions: Self-care/ADL training;Patient/family education;Therapeutic exercise;Balance training;DME and/or AE instruction;Therapeutic activities;Energy conservation    OT Goals(Current goals can be  found in the care plan section) Acute Rehab OT Goals Patient Stated Goal: go home OT Goal Formulation: With patient Time For Goal Achievement: 10/16/22 Potential to Achieve Goals: Good ADL Goals Pt Will Perform Grooming: with supervision Pt Will Perform Lower Body Dressing: with supervision Pt Will Transfer to Toilet: with supervision Pt Will Perform Toileting - Clothing Manipulation and hygiene: with supervision  OT Frequency: Min 2X/week    Co-evaluation              AM-PAC OT "6 Clicks" Daily Activity     Outcome Measure Help from another person eating meals?: None Help from another person taking care of personal grooming?: A Little Help from another person toileting, which includes using toliet, bedpan, or urinal?: A Little Help from another person bathing (including washing, rinsing, drying)?: A Little Help from another person to put on and taking off regular upper body clothing?: None Help from another person to put on and taking off regular lower body clothing?: A Little 6 Click Score: 20   End of Session Equipment Utilized During Treatment: Gait belt;Rolling walker (2 wheels) Nurse Communication: Mobility status  Activity Tolerance: Patient tolerated treatment well Patient left: in chair;with call bell/phone within reach;with chair alarm set;with nursing/sitter in room  OT Visit Diagnosis: Unsteadiness on feet (R26.81);Muscle weakness (generalized) (M62.81)  Time: 7948-0165 OT Time Calculation (min): 26 min Charges:  OT General Charges $OT Visit: 1 Visit OT Evaluation $OT Eval Moderate Complexity: 1 Mod  Shanon Payor, OTD OTR/L  10/02/22, 9:48 AM

## 2022-10-02 NOTE — Progress Notes (Signed)
[facility]  PROGRESS NOTE   Subjective:   Patient is out of bed to chair.  Ate very well today.  Had stable dialysis yesterday.  Objective:  Vital signs: Blood pressure (!) 123/101, pulse 68, temperature 99.3 F (37.4 C), temperature source Oral, resp. rate 16, height '5\' 9"'$  (1.753 m), weight 75.7 kg, SpO2 98 %.  Intake/Output Summary (Last 24 hours) at 10/02/2022 1319 Last data filed at 10/02/2022 1020 Gross per 24 hour  Intake 720 ml  Output 1575 ml  Net -855 ml   Filed Weights   09/30/22 1145  Weight: 75.7 kg     Physical Exam: General:  No acute distress  Head:  Normocephalic, atraumatic. Moist oral mucosal membranes  Eyes:  Anicteric  Neck:  Supple  Lungs:   Clear to auscultation, normal effort  Heart:  S1S2 no rubs  Abdomen:   Soft, nontender, bowel sounds present  Extremities:  peripheral edema.  Neurologic:  Awake, alert, following commands  Skin:  No lesions  Access:     Basic Metabolic Panel: Recent Labs  Lab 09/30/22 1407 10/01/22 0518 10/01/22 1237  NA 144 141 141  K 4.6 4.4 4.7  CL 106 107 104  CO2 '27 26 27  '$ GLUCOSE 120* 135* 221*  BUN 40* 46* 48*  CREATININE 6.14* 6.73* 6.96*  CALCIUM 9.1 8.8* 8.7*  PHOS  --   --  3.2    CBC: Recent Labs  Lab 09/30/22 1407 10/01/22 0518 10/01/22 1237 10/02/22 0502  WBC 8.0 7.3 7.7 6.9  NEUTROABS 6.4  --   --   --   HGB 10.3* 9.1* 10.0* 8.9*  HCT 32.8* 29.3* 31.7* 27.6*  MCV 95.9 94.5 94.9 93.6  PLT 360 339 339 294     Urinalysis: Recent Labs    09/30/22 1206  COLORURINE YELLOW*  LABSPEC 1.012  PHURINE 8.0  GLUCOSEU NEGATIVE  HGBUR NEGATIVE  BILIRUBINUR NEGATIVE  KETONESUR NEGATIVE  PROTEINUR 100*  NITRITE NEGATIVE  LEUKOCYTESUR NEGATIVE      Imaging: No results found.   Medications:    sodium chloride      atorvastatin  20 mg Oral QHS   calcitRIOL  0.5 mcg Oral Daily   Chlorhexidine Gluconate Cloth  6 each Topical Daily   Chlorhexidine Gluconate Cloth  6 each  Topical Q0600   cinacalcet  30 mg Oral Q breakfast   ciprofloxacin  500 mg Oral Daily   cloNIDine  0.1 mg Oral BID   finasteride  5 mg Oral Daily   gabapentin  300 mg Oral q1800   heparin  5,000 Units Subcutaneous Q8H   insulin aspart  0-5 Units Subcutaneous QHS   insulin aspart  0-6 Units Subcutaneous TID WC   insulin glargine-yfgn  10 Units Subcutaneous Daily   OLANZapine  15 mg Oral QHS   traZODone  100 mg Oral QHS    Assessment/ Plan:     Principal Problem:   Shortness of breath Active Problems:   Schizophrenia (HCC)   Hypertension   Tobacco abuse   Type 2 diabetes mellitus without complication, without long-term current use of insulin (HCC)   Hyperlipidemia   Benign prostatic hyperplasia with lower urinary tract symptoms   Weakness  76 year old man with history of hypertension, coronary artery disease, congestive heart failure, schizophrenia, end-stage renal disease on hemodialysis, diabetes now admitted with history of missing dialysis Shortness of breath and weakness.   #1: End-stage renal disease: Patient has been on Monday Wednesday Friday schedule.  He was  dialyzed yesterday.  We will place him on dialysis again tomorrow to get him on his Monday Wednesday Friday schedule.    #2: Diabetes: We will continue the insulin as ordered.   #3: Second hyperparathyroidism: We will continue the Cinacalcet and also calcitriol.  We will monitor PTH, calcium and phosphorus levels.   #4: Anemia: Anemia is most likely secondary to chronic kidney disease.  We will continue anemia protocol.   We will monitor closely.   LOS: [place], MD [facility] 11/5/20231:19 PM

## 2022-10-02 NOTE — Plan of Care (Signed)
  Problem: Education: Goal: Knowledge of General Education information will improve Description: Including pain rating scale, medication(s)/side effects and non-pharmacologic comfort measures Outcome: Not Applicable Note: Unable to review education due to patient's mentation. Will continue to monitor.

## 2022-10-02 NOTE — NC FL2 (Signed)
[place] LEVEL OF CARE SCREENING TOOL     IDENTIFICATION  Patient Name: [name] Birthdate: 08-Mar-1946 Sex: male Admission Date (Current Location): 09/30/2022  [place] and [place] Number:  Engineering geologist and Address:  [facility], [address], [place], [place] [address]      Provider Number: [id number]  Attending Physician Name and Address:  Sharen Hones, MD  Relative Name and Phone Number:  [name] (Legal Guardian) [phone] (Mobile)    Current Level of Care: Hospital Recommended Level of Care: Other (Comment) (group home) Prior Approval Number:    Date Approved/Denied:   PASRR Number:    Discharge Plan:      Current Diagnoses: Patient Active Problem List   Diagnosis Date Noted   Shortness of breath 09/30/2022   Weakness 09/30/2022   Hypocalcemia 01/17/2019   History of hip fracture 12/15/2018   Secondary hyperparathyroidism of renal origin ([place]) 07/09/2018   Tobacco dependence 07/09/2018   Uncontrolled type 2 diabetes mellitus with hyperglycemia, without long-term current use of insulin ([place]) 05/17/2017   Hyperglycemia 67/61/9509   Folic acid deficiency 32/67/1245   Chronic constipation 03/02/2017   Thiamine deficiency 03/02/2017   Chronic seasonal allergic rhinitis due to pollen 03/02/2017   Type 2 diabetes mellitus without complication, without long-term current use of insulin ([place]) 03/02/2017   Lives in long-term care facility 03/02/2017   Hyperlipidemia 03/02/2017   Vitamin D deficiency 03/02/2017   Anemia 03/02/2017   Osteoporosis 07/07/2015   Benign prostatic hyperplasia with lower urinary tract symptoms 02/09/2015   Encephalopathy, metabolic 80/99/8338   CKD (chronic kidney disease) stage 4, GFR 15-29 ml/min (HCC) 01/05/2012   Tobacco abuse 25/03/3975   Metabolic acidosis 73/41/9379   Pancreatitis, hx of  11/09/2011   Schizophrenia ([place]) 11/09/2011   History of nephrolithiasis  11/09/2011   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 Yes; Fluid consistency: Thin)  AMBULATORY STATUS COMMUNICATION OF NEEDS Skin   Limited Assist Verbally Other (Comment) (L upper arm - foam dressing)                       Personal Care Assistance Level of Assistance  Bathing, Feeding, Dressing Bathing Assistance: Limited assistance Feeding assistance: Limited assistance Dressing Assistance: Limited assistance     Functional Limitations Info             [place]  PT (By licensed PT)     PT Frequency: home health              Contractures      Additional Factors Info  Code Status, Allergies Code Status Info: DNR Allergies Info: NKA           STOP taking these medications     amLODipine 5 MG tablet Commonly known as: NORVASC    losartan 100 MG tablet Commonly known as: COZAAR    sulfamethoxazole-trimethoprim 400-80 MG tablet Commonly known as: BACTRIM  TAKE these medications     atorvastatin 20 MG tablet Commonly known as: LIPITOR Take 1 tablet (20 mg total) by mouth daily.    calcitRIOL 0.5 MCG capsule Commonly known as: ROCALTROL Take 1 capsule (0.5 mcg total) by mouth daily.    cinacalcet 30 MG tablet Commonly known as: SENSIPAR Take 30 mg by mouth daily.    ciprofloxacin 500 MG tablet Commonly known as: CIPRO Take 1 tablet (500 mg total) by mouth daily for 6 days.     cloNIDine 0.1 MG tablet Commonly known as: CATAPRES Take 0.1 mg by mouth 2 (two) times daily.    denosumab 60 MG/ML Sosy injection Commonly known as: PROLIA Inject 1 mL into the skin every 6 (six) months.    docusate sodium 100 MG capsule Commonly known as: COLACE Take 1 capsule (100 mg total) by mouth 2 (two) times daily. What changed:  when to take this reasons to take this    doxazosin 2 MG tablet Commonly known as: CARDURA Take 2 mg by mouth at bedtime.    ergocalciferol 1.25 MG (50000 UT) capsule Commonly known as: VITAMIN D2 Take 1 capsule (50,000 Units total) by mouth every 6 (six) weeks.    finasteride 5 MG tablet Commonly known as: PROSCAR TAKE ONE TABLET BY MOUTH EVERY DAY FOR ENLARGED PROSTATE What changed:  how much to take how to take this when to take this    fluticasone 50 MCG/ACT nasal spray Commonly known as: FLONASE USE 2 SPRAYS IN EACH NOSTRIL DAILY AS NEEDED What changed:  how much to take how to take this when to take this additional instructions    folic acid 1 MG tablet Commonly known as: FOLVITE Take 1 tablet (1 mg total) by mouth daily.    gabapentin 400 MG capsule Commonly known as: NEURONTIN Take 400 mg by mouth 2 (two) times daily.    glucose blood test strip 1 each by Other route 2 (two) times daily. Use as instructed    HYDROcodone-acetaminophen 5-325 MG tablet Commonly known as: NORCO/VICODIN Take 2 tablets by mouth every 6 (six) hours as needed for moderate pain.    Lantus SoloStar 100 UNIT/ML Solostar Pen Generic drug: insulin glargine Inject 10 Units into the skin Nightly.    Melatonin TR 1 MG Tbcr Generic drug: Melatonin ER TAKE 1 TABLET BY MOUTH EVERY NIGHT AT BEDTIME    multivitamin with minerals tablet TAKE 1 TABLET BY MOUTH DAILY    OLANZapine 15 MG tablet Commonly known as: ZYPREXA Take 15 mg by mouth at bedtime.    pioglitazone 30 MG tablet Commonly known as: ACTOS Take 30 mg by mouth daily.     thiamine 100 MG tablet Commonly known as: Vitamin B-1 Take 1 tablet (100 mg total) by mouth daily.    traZODone 100 MG tablet Commonly known as: DESYREL Take 100 mg by mouth at bedtime.    TRUEplus Lancets 26G Misc 1 each by Does not apply route 2 (two) times daily.   Relevant Imaging Results:  Relevant Lab Results:   Additional Information SS #: [id number]  [place], LCSW

## 2022-10-02 NOTE — TOC Transition Note (Addendum)
Transition of Care [facility]) - CM/SW Discharge Note   Patient Details  Name: [name] MRN: [id number] Date of Birth: [birth year]  [facility] CM/SW Contact:  Magnus Ivan, LCSW Phone Number: 10/02/2022, 10:18 AM   Clinical Narrative:    Patient to DC this afternoon. OT recs 3in1 and wheelchair.  CSW called Timmy with [facility] who stated they will either have group home staff pick patient up or will pay for an Melburn Popper, he requested RN call him when patient is ready for pick up. Timmy requested 3in1 and wheelchair be delivered to the group home, he is aware it may not be delivered today. DME referral made to [facility] with [employer] for home delivery. RW was already ordered yesterday to be delivered to the bedside today prior to DC. Notified Corene Cornea with [facility] of DC home today.   FL2 with DC Meds printed to be sent in [place].     Final next level of care: Group Home Barriers to Discharge: Barriers Resolved   Patient Goals and CMS Choice   CMS Medicare.gov Compare Post Acute Care list provided to:: Legal Guardian Choice offered to / list presented to : [facility] POA / Guardian  Discharge Placement                Patient to be transferred to facility by: group home to arrange   Patient and family notified of of transfer: 10/02/22  Discharge Plan and Services                DME Arranged: Gilford Rile rolling, 3-N-1, Wheelchair manual DME Agency: [employer] Date DME Agency Contacted: 10/02/22   Representative spoke with at DME Agency: [place]: PT [place]: [place] ([place]) Date [place]: 10/02/22   Representative spoke with at [place]: [place] ([place]) Interventions     Readmission Risk Interventions    10/01/2022   12:29 PM  Readmission Risk Prevention Plan  Transportation Screening Complete  PCP or Specialist Appt within 3-5 Days Complete  HRI or [place] Complete  Social  Work Consult for [place] Planning/Counseling Complete  Palliative Care Screening Not Applicable  Medication Review Press photographer) Complete

## 2022-10-02 NOTE — Discharge Summary (Addendum)
Physician Discharge Summary   Patient: [name] MRN: [id number] DOB: [birth year]  Admit date:     09/30/2022  Discharge date: 10/02/22  Discharge Physician: Sharen Hones   PCP: [facility]   Recommendations at discharge:   Follow-up with PCP in 1 week. Follow-up with vascular surgery in 1 week.  Discharge Diagnoses: Principal Problem:   Shortness of breath Active Problems:   Schizophrenia ([place])   Hypertension   Tobacco abuse   Type 2 diabetes mellitus without complication, without long-term current use of insulin (HCC)   Hyperlipidemia   Benign prostatic hyperplasia with lower urinary tract symptoms   Weakness Left arm wound infection secondary to Enterobacter Colace. End-stage renal disease on hemodialysis. Resolved Problems:   * No resolved hospital problems. [facility] Course: [name] is a 76 year old male with with history of end-stage renal disease on hemodialysis MWF, insulin-dependent diabetes mellitus 2, hyperlipidemia, hypertension, neuropathy, depression, anxiety, insomnia, who presents emergency department for chief concerns of shortness of breath and weakness. Patient missed his dialysis yesterday.  Lab test showed no leukocytosis, UA does not suggest UTI.  Chest x-ray did not show pneumonia.  Skin has no cellulitis.  However, initial blood draw at admission showed procalcitonin level of 20.82.   Patient appears to have left upper extremity wound with infection, started on Cipro, procalcitonin level dropped down afterwards.  Blood culture negative in 24 hours.  We will continue Cipro for additional 6 days.  Patient be follow-up with vascular surgery as outpatient.  Assessment and Plan:  End-stage renal disease Short of breath secondary to missed dialysis. General weakness secondary to missed dialysis. Symptoms improved after dialysis.  Patient still has some weakness, set up home care.   Elevated procalcitonin level. Left  arm wound infection ruled in. Reviewed previous notes from vascular surgery on 10/18, patient had a wound at that time, appears to be dehiscence from prior incision.  AV fistula in that arm.  There was some draining at that time, culture was sent out, came back with Enterobacter Colace. Based on my exam, he does not have a cellulitis, still has a small wound with minimal draining.  He has elevated procalcitonin level, which is improved after given Cipro, patient has infection of the wound.  We will continue Cipro for additional 6 days.  Blood cultures so far no growth.  Patient be followed up with vascular surgery as outpatient.   Type 2 diabetes Resume home regimen.   Essential hypertension. Pressure borderline low, blood pressure medicine adjusted.   Schizophrenia Continue olanzapine.      Consultants: Nephrology Procedures performed: HD  Disposition:  Group home Diet recommendation:  Discharge Diet Orders (From admission, onward)     Start     Ordered   10/02/22 0000  Diet general       Comments: Renal diet   10/02/22 1051           Renal diet DISCHARGE MEDICATION: Allergies as of 10/02/2022   No Known Allergies      Medication List     STOP taking these medications    amLODipine 5 MG tablet Commonly known as: NORVASC   losartan 100 MG tablet Commonly known as: COZAAR   sulfamethoxazole-trimethoprim 400-80 MG tablet Commonly known as: BACTRIM       TAKE these medications    atorvastatin 20 MG tablet Commonly known as: LIPITOR Take 1 tablet (20 mg total) by mouth daily.   calcitRIOL 0.5 MCG capsule Commonly known  as: ROCALTROL Take 1 capsule (0.5 mcg total) by mouth daily.   cinacalcet 30 MG tablet Commonly known as: SENSIPAR Take 30 mg by mouth daily.   ciprofloxacin 500 MG tablet Commonly known as: CIPRO Take 1 tablet (500 mg total) by mouth daily for 6 days.   cloNIDine 0.1 MG tablet Commonly known as: CATAPRES Take 0.1 mg by mouth 2  (two) times daily.   denosumab 60 MG/ML Sosy injection Commonly known as: PROLIA Inject 1 mL into the skin every 6 (six) months.   docusate sodium 100 MG capsule Commonly known as: COLACE Take 1 capsule (100 mg total) by mouth 2 (two) times daily. What changed:  when to take this reasons to take this   doxazosin 2 MG tablet Commonly known as: CARDURA Take 2 mg by mouth at bedtime.   ergocalciferol 1.25 MG (50000 UT) capsule Commonly known as: VITAMIN D2 Take 1 capsule (50,000 Units total) by mouth every 6 (six) weeks.   finasteride 5 MG tablet Commonly known as: PROSCAR TAKE ONE TABLET BY MOUTH EVERY DAY FOR ENLARGED PROSTATE What changed:  how much to take how to take this when to take this   fluticasone 50 MCG/ACT nasal spray Commonly known as: FLONASE USE 2 SPRAYS IN EACH NOSTRIL DAILY AS NEEDED What changed:  how much to take how to take this when to take this additional instructions   folic acid 1 MG tablet Commonly known as: FOLVITE Take 1 tablet (1 mg total) by mouth daily.   gabapentin 400 MG capsule Commonly known as: NEURONTIN Take 400 mg by mouth 2 (two) times daily.   glucose blood test strip 1 each by Other route 2 (two) times daily. Use as instructed   HYDROcodone-acetaminophen 5-325 MG tablet Commonly known as: NORCO/VICODIN Take 2 tablets by mouth every 6 (six) hours as needed for moderate pain.   Lantus SoloStar 100 UNIT/ML Solostar Pen Generic drug: insulin glargine Inject 10 Units into the skin Nightly.   Melatonin TR 1 MG Tbcr Generic drug: Melatonin ER TAKE 1 TABLET BY MOUTH EVERY NIGHT AT BEDTIME   multivitamin with minerals tablet TAKE 1 TABLET BY MOUTH DAILY   OLANZapine 15 MG tablet Commonly known as: ZYPREXA Take 15 mg by mouth at bedtime.   pioglitazone 30 MG tablet Commonly known as: ACTOS Take 30 mg by mouth daily.   thiamine 100 MG tablet Commonly known as: Vitamin B-1 Take 1 tablet (100 mg total) by mouth  daily.   traZODone 100 MG tablet Commonly known as: DESYREL Take 100 mg by mouth at bedtime.   TRUEplus Lancets 26G Misc 1 each by Does not apply route 2 (two) times daily.               Durable Medical Equipment  (From admission, onward)           Start     Ordered   10/02/22 1033  For home use only DME high strength lightweight manual wheelchair with seat cushion  Once       Comments: Patient suffers from ESRD, weakness which impairs their ability to perform daily activities like bathing and toileting in the home.  A walker will not resolve  issue with performing activities of daily living. A wheelchair will allow patient to safely perform daily activities.Length of need 6 months . (THEN ONE OF THESE TWO:) Patient self-propels the wheelchair while engaging in frequent activities such as toileting which cannot be performed in a standard or lightweight wheelchair due to  the weight of the chair. Accessories: elevating leg rests (ELRs), wheel locks, extensions and anti-tippers.   10/02/22 1034   10/02/22 0944  For home use only DME Bedside commode  Once       Comments: 3 in 1  Question:  Patient needs a bedside commode to treat with the following condition  Answer:  Altered mental state   10/02/22 0944   10/02/22 0943  For home use only DME Walker rolling  Once       Question Answer Comment  Walker: With Paint   Patient needs a walker to treat with the following condition Altered mental state      10/02/22 0944   10/01/22 1233  For home use only DME Walker rolling  Once       Question Answer Comment  Walker: With [place]   Patient needs a walker to treat with the following condition ESRD (end stage renal disease) ([place])      10/01/22 1232              Discharge Care Instructions  (From admission, onward)           Start     Ordered   10/02/22 0000  Discharge wound care:       Comments: Follow with vascular surgery   10/02/22 1051             Follow-up Information     Dew, Erskine Squibb, MD Follow up in 1 week(s).   Specialties: Vascular Surgery, Radiology, Interventional Cardiology Contact information: [address] [address] [place], [place] Follow up in 1 week(s).   Specialty: General Practice Contact information: [place] [address] [phone]                Discharge Exam: Danley Danker Weights   09/30/22 1145  Weight: 75.7 kg   General exam: Appears calm and comfortable  Respiratory system: Clear to auscultation. Respiratory effort normal. Cardiovascular system: S1 & S2 heard, RRR. No JVD, murmurs, rubs, gallops or clicks. No pedal edema. Gastrointestinal system: Abdomen is nondistended, soft and nontender. No organomegaly or masses felt. Normal bowel sounds heard. Central nervous system: Alert and oriented x2. No focal neurological deficits. Extremities: Left arm wound with minimal drain.  Skin: No rashes, lesions or ulcers Psychiatry: Judgement and insight appear normal. Mood & affect appropriate.    Condition at discharge: good  The results of significant diagnostics from this hospitalization (including imaging, microbiology, ancillary and laboratory) are listed below for reference.   Imaging Studies: DG Chest Port 1 View  Result Date: 09/30/2022 CLINICAL DATA:  Weakness per ordering notes. Patient unable to give adequate history at this time. Per RN- patient taken to decon shower due to bed bugs. EXAM: PORTABLE CHEST - 1 VIEW COMPARISON:  07/14/2022 FINDINGS: Right IJ hemodialysis catheter to the distal SVC as before. Slightly lower lung volumes with prominent coarse interstitial markings in the lung bases as before. No new airspace disease. Heart size upper limits normal. No effusion. Posttraumatic change in the left humeral head. IMPRESSION: No acute cardiopulmonary disease. Electronically Signed   By: Lucrezia Europe M.D.   On:  09/30/2022 12:28    Microbiology: Results for orders placed or performed during the hospital encounter of 09/30/22  Resp Panel by RT-PCR (Flu A&B, Covid)     Status: None   Collection Time: 09/30/22 12:06 PM   Specimen:  Nasal Swab  Result Value Ref Range Status   SARS Coronavirus 2 by RT PCR NEGATIVE NEGATIVE Final    Comment: (NOTE) SARS-CoV-2 target nucleic acids are NOT DETECTED.  The SARS-CoV-2 RNA is generally detectable in upper respiratory specimens during the acute phase of infection. The lowest concentration of SARS-CoV-2 viral copies this assay can detect is 138 copies/mL. A negative result does not preclude SARS-Cov-2 infection and should not be used as the sole basis for treatment or other patient management decisions. A negative result may occur with  improper specimen collection/handling, submission of specimen other than nasopharyngeal swab, presence of viral mutation(s) within the areas targeted by this assay, and inadequate number of viral copies(<138 copies/mL). A negative result must be combined with clinical observations, patient history, and epidemiological information. The expected result is Negative.  Fact Sheet for Patients:  EntrepreneurPulse.com.au  Fact Sheet for Healthcare Providers:  IncredibleEmployment.be  This test is no t yet approved or cleared by the [place] FDA and  has been authorized for detection and/or diagnosis of SARS-CoV-2 by FDA under an Emergency Use Authorization (EUA). This EUA will remain  in effect (meaning this test can be used) for the duration of the COVID-19 declaration under Section 564(b)(1) of the Act, 21 U.S.C.section 360bbb-3(b)(1), unless the authorization is terminated  or revoked sooner.       Influenza A by PCR NEGATIVE NEGATIVE Final   Influenza B by PCR NEGATIVE NEGATIVE Final    Comment: (NOTE) The Xpert Xpress SARS-CoV-2/FLU/RSV plus assay is intended as an aid in  the diagnosis of influenza from Nasopharyngeal swab specimens and should not be used as a sole basis for treatment. Nasal washings and aspirates are unacceptable for Xpert Xpress SARS-CoV-2/FLU/RSV testing.  Fact Sheet for Patients: EntrepreneurPulse.com.au  Fact Sheet for Healthcare Providers: IncredibleEmployment.be  This test is not yet approved or cleared by the [place] FDA and has been authorized for detection and/or diagnosis of SARS-CoV-2 by FDA under an Emergency Use Authorization (EUA). This EUA will remain in effect (meaning this test can be used) for the duration of the COVID-19 declaration under Section 564(b)(1) of the Act, 21 U.S.C. section 360bbb-3(b)(1), unless the authorization is terminated or revoked.  Performed at [facility], [address] [place], [place] [address]   Culture, blood (Routine X 2) w Reflex to ID Panel     Status: None (Preliminary result)   Collection Time: 10/01/22 11:39 AM   Specimen: BLOOD  Result Value Ref Range Status   Specimen Description BLOOD BLOOD RIGHT WRIST  Final   Special Requests   Final    BOTTLES DRAWN AEROBIC AND ANAEROBIC Blood Culture results may not be optimal due to an inadequate volume of blood received in culture bottles   Culture   Final    NO GROWTH < 24 HOURS Performed at [facility], [address], [place], [place] [address]    Report Status PENDING  Incomplete  Culture, blood (Routine X 2) w Reflex to ID Panel     Status: None (Preliminary result)   Collection Time: 10/01/22 11:39 AM   Specimen: BLOOD RIGHT HAND  Result Value Ref Range Status   Specimen Description BLOOD RIGHT HAND  Final   Special Requests   Final    BOTTLES DRAWN AEROBIC AND ANAEROBIC Blood Culture results may not be optimal due to an inadequate volume of blood received in culture bottles   Culture   Final    NO GROWTH < 24 HOURS Performed  at [facility], [address] [place], [place] [address]    Report Status PENDING  Incomplete    Labs: CBC: Recent Labs  Lab 09/30/22 1407 10/01/22 0518 10/01/22 1237 10/02/22 0502  WBC 8.0 7.3 7.7 6.9  NEUTROABS 6.4  --   --   --   HGB 10.3* 9.1* 10.0* 8.9*  HCT 32.8* 29.3* 31.7* 27.6*  MCV 95.9 94.5 94.9 93.6  PLT 360 339 339 191   Basic Metabolic Panel: Recent Labs  Lab 09/30/22 1407 10/01/22 0518 10/01/22 1237  NA 144 141 141  K 4.6 4.4 4.7  CL 106 107 104  CO2 '27 26 27  '$ GLUCOSE 120* 135* 221*  BUN 40* 46* 48*  CREATININE 6.14* 6.73* 6.96*  CALCIUM 9.1 8.8* 8.7*  PHOS  --   --  3.2   Liver Function Tests: Recent Labs  Lab 09/30/22 1407 10/01/22 1237  AST 32  --   ALT 21  --   ALKPHOS 59  --   BILITOT 0.8  --   PROT 7.5  --   ALBUMIN 3.1* 2.8*   CBG: Recent Labs  Lab 10/01/22 0817 10/01/22 1156 10/01/22 1804 10/01/22 2107 10/02/22 0749  GLUCAP 118* 184* 163* 192* 194*    Discharge time spent: greater than 30 minutes.  Signed: Sharen Hones, MD [facility] 10/02/2022

## 2022-10-02 NOTE — Plan of Care (Signed)
IV removed, [name] from group home contacted and patient was picked up by group home driver  Discharge instructions placed in envelope and [name] advised to review.

## 2022-10-03 LAB — HEPATITIS B E ANTIBODY: Hep B E Ab: NEGATIVE

## 2022-10-06 LAB — CULTURE, BLOOD (ROUTINE X 2)
Culture: NO GROWTH
Culture: NO GROWTH

## 2022-10-07 ENCOUNTER — Telehealth (INDEPENDENT_AMBULATORY_CARE_PROVIDER_SITE_OTHER): Payer: Self-pay

## 2022-10-07 NOTE — Telephone Encounter (Signed)
[place] from [facility] called and left LV concerning a referral for pt.  She could not get anyone from the group home to answer or call back.  I called [place] back and she stated that since the message was left on VM pt has been in the hospital and the referral was given to Jolly at discharge.

## 2022-10-10 ENCOUNTER — Encounter (INDEPENDENT_AMBULATORY_CARE_PROVIDER_SITE_OTHER): Payer: Self-pay | Admitting: Nurse Practitioner

## 2022-10-10 NOTE — Progress Notes (Signed)
Subjective:    Patient ID: [name], male    DOB: 08/25/1946, 76 y.o.   MRN: [id number] No chief complaint on file.   The patient returns today for evaluation of a wound on his left upper extremity following graft placement the patient has some cognitive difficulties which make treatment of his wound difficult.  The wound appears smaller in length however deeper in depth.  There is no drainage.  The patient was originally scheduled to have home health however it appears that the patient has not been receiving this as we previously arranged.  The patient has a gauze over his wound and is unsure when the last time it was changed.    Review of Systems  Skin:  Positive for wound.  Psychiatric/Behavioral:  Positive for confusion and decreased concentration.   All other systems reviewed and are negative.      Objective:   Physical Exam Vitals reviewed.  HENT:     Head: Normocephalic.  Cardiovascular:     Rate and Rhythm: Normal rate.     Pulses: Normal pulses.  Pulmonary:     Effort: Pulmonary effort is normal.  Skin:    General: Skin is warm and dry.  Neurological:     Mental Status: He is alert and oriented to person, place, and time.  Psychiatric:        Mood and Affect: Mood normal.        Behavior: Behavior normal.        Thought Content: Thought content normal.        Judgment: Judgment normal.     BP 103/61 (BP Location: Right Arm)   Pulse 89   Resp 18   Ht '5\' 9"'$  (1.753 m)   Wt 167 lb (75.8 kg)   BMI 24.66 kg/m   Past Medical History:  Diagnosis Date   Alcoholism ([place])    Allergy    Altered mental status 2011   hx of 2/2 hypercalcemia, EtOH w/d and medication overuse   Angina    mild pain on arm, left chest, moves down left chest   BPH (benign prostatic hyperplasia)    Chronic kidney disease, stage 4 (severe) (HCC)    Chronic renal insufficiency    with history of acute on chronic secondary to dehydration and hypercalcemia   Dementia (HCC)     Encephalopathy    Fx humeral neck 2008   right side, no history of surgical repair   History of nephrolithiasis 2008   s/p ureterolithotomy    Hypercalcemia    2/2 primary hyperparathyroidism   Hyperkalemia    Hypertension    Pancreatitis 2011   history of several admissions for pancreatitis    Parathyroid adenoma    Primary hyperparathyroidism ([place])    s/p parathyroid adenoma resection Oct. 8, 2011, repeat scan showed residual right adenoma, however pt did not f/u with surgery as outpt   Schizophrenia ([place])    previously on zyprexa   Tobacco abuse    Type 2 diabetes mellitus with chronic kidney disease ([place])     Social History   Socioeconomic History   Marital status: Single    Spouse name: Not on file   Number of children: Not on file   Years of education: Not on file   Highest education level: Not on file  Occupational History   Occupation: disabled  Tobacco Use   Smoking status: Every Day    Packs/day: 1.00    Years: 50.00    Total  pack years: 50.00    Types: Cigarettes   Smokeless tobacco: Never  Substance and Sexual Activity   Alcohol use: No    Comment: 03/09/12 "last alcohol 1983"; history of heavy alcohol use   Drug use: No    Types: Marijuana    Comment: 03/09/12 Last drug use "~1978"   Sexual activity: Never  Other Topics Concern   Not on file  Social History Narrative   Patient is disabled. Patient lives with at group home [facility]) Patient has history of being in prison.History of alcoholism   Social Determinants of Radio broadcast assistant Strain: Not on file  Food Insecurity: Not on file  Transportation Needs: Not on file  Physical Activity: Not on file  Stress: Not on file  Social Connections: Not on file  Intimate Partner Violence: Not on file    Past Surgical History:  Procedure Laterality Date   Ankle Pinning     AV FISTULA PLACEMENT Left 08/17/2022   Procedure: INSERTION OF ARTERIOVENOUS (AV) GORE-TEX GRAFT ARM (BRACHIAL  AXILLARY);  Surgeon: Algernon Huxley, MD;  Location: [facility] ORS;  Service: Vascular;  Laterality: Left;   cysto, removal of bladder stone, attempted stent placement  08/15/2007   cystoscopy, laser cystolithoplaxy  08/16/2007   DIALYSIS/PERMA CATHETER INSERTION N/A 07/04/2022   Procedure: DIALYSIS/PERMA CATHETER INSERTION;  Surgeon: Algernon Huxley, MD;  Location: [facility];  Service: Cardiovascular;  Laterality: N/A;   KIDNEY STONE SURGERY  07/2007 X2   /E-chart   open left proximal ureterolithotomy  09/18/2007   parathyroid adenoma resection  09/04/2010   residual adenoma Oct. 12, 2011   PARATHYROIDECTOMY  03/09/2012   Procedure: PARATHYROIDECTOMY;  Surgeon: Pedro Earls, MD;  Location: [facility];  Service: General;  Laterality: N/A;  mediastinal paraqthyroidectomy    THYROIDECTOMY  03/09/2012    Family History  Problem Relation Age of Onset   Kidney cancer Neg Hx    Bladder Cancer Neg Hx    Prostate cancer Neg Hx     No Known Allergies     Latest Ref Rng & Units 10/02/2022    5:02 AM 10/01/2022   12:37 PM 10/01/2022    5:18 AM  CBC  WBC 4.0 - 10.5 K/uL 6.9  7.7  7.3   Hemoglobin 13.0 - 17.0 g/dL 8.9  10.0  9.1   Hematocrit 39.0 - 52.0 % 27.6  31.7  29.3   Platelets 150 - 400 K/uL 294  339  339       CMP     Component Value Date/Time   NA 141 10/01/2022 1237   NA 135 03/08/2017 1102   K 4.7 10/01/2022 1237   CL 104 10/01/2022 1237   CO2 27 10/01/2022 1237   GLUCOSE 221 (H) 10/01/2022 1237   BUN 48 (H) 10/01/2022 1237   BUN 45 (H) 03/08/2017 1102   CREATININE 6.96 (H) 10/01/2022 1237   CALCIUM 8.7 (L) 10/01/2022 1237   CALCIUM >15.0 (HH) 11/09/2011 1455   PROT 7.5 09/30/2022 1407   ALBUMIN 2.8 (L) 10/01/2022 1237   ALBUMIN 4.1 03/08/2017 1102   AST 32 09/30/2022 1407   ALT 21 09/30/2022 1407   ALKPHOS 59 09/30/2022 1407   BILITOT 0.8 09/30/2022 1407   GFRNONAA 8 (L) 10/01/2022 1237   GFRAA 21 (L) 01/20/2019 0924     No results found.     Assessment &  Plan:   1. Postoperative wound infection Based on the wound cultures we will prescribe the  patient with Bactrim.  However given the fact that the patient has a graft there is not a significant improvement at the patient's follow-up visit we will likely have a discussion about removal of the graft.  We will also reach back out to the home health agency to help with evaluation and treatment of the wound.  We also coordinated this with the patient's group home.  Antibiotic and wound care instructions were also given to the group home attendant present with the patient today.   Current Outpatient Medications on File Prior to Visit  Medication Sig Dispense Refill   calcitRIOL (ROCALTROL) 0.5 MCG capsule Take 1 capsule (0.5 mcg total) by mouth daily. 30 capsule 2   cinacalcet (SENSIPAR) 30 MG tablet Take 30 mg by mouth daily.     cloNIDine (CATAPRES) 0.1 MG tablet Take 0.1 mg by mouth 2 (two) times daily.     denosumab (PROLIA) 60 MG/ML SOSY injection Inject 1 mL into the skin every 6 (six) months.     docusate sodium (COLACE) 100 MG capsule Take 1 capsule (100 mg total) by mouth 2 (two) times daily. (Patient taking differently: Take 100 mg by mouth 2 (two) times daily as needed.) 60 capsule 6   doxazosin (CARDURA) 2 MG tablet Take 2 mg by mouth at bedtime.     ergocalciferol (VITAMIN D2) 50000 units capsule Take 1 capsule (50,000 Units total) by mouth every 6 (six) weeks. 30 capsule 6   finasteride (PROSCAR) 5 MG tablet TAKE ONE TABLET BY MOUTH EVERY DAY FOR ENLARGED PROSTATE (Patient taking differently: Take 5 mg by mouth daily. TAKE ONE TABLET BY MOUTH EVERY DAY FOR ENLARGED PROSTATE) 90 tablet 3   fluticasone (FLONASE) 50 MCG/ACT nasal spray USE 2 SPRAYS IN EACH NOSTRIL DAILY AS NEEDED (Patient taking differently: Place 1 spray into both nostrils daily.) 16 g 11   folic acid (FOLVITE) 1 MG tablet Take 1 tablet (1 mg total) by mouth daily. 30 tablet 6   gabapentin (NEURONTIN) 400 MG capsule Take 400  mg by mouth 2 (two) times daily.     glucose blood test strip 1 each by Other route 2 (two) times daily. Use as instructed 100 each 5   HYDROcodone-acetaminophen (NORCO/VICODIN) 5-325 MG tablet Take 2 tablets by mouth every 6 (six) hours as needed for moderate pain. 20 tablet 0   Insulin Glargine (LANTUS SOLOSTAR) 100 UNIT/ML Solostar Pen Inject 10 Units into the skin Nightly.     MELATONIN TR 1 MG TBCR TAKE 1 TABLET BY MOUTH EVERY NIGHT AT BEDTIME 30 tablet 0   Multiple Vitamins-Minerals (MULTIVITAMIN WITH MINERALS) tablet TAKE 1 TABLET BY MOUTH DAILY 30 tablet 5   OLANZapine (ZYPREXA) 15 MG tablet Take 15 mg by mouth at bedtime.     pioglitazone (ACTOS) 30 MG tablet Take 30 mg by mouth daily.     thiamine (VITAMIN B-1) 100 MG tablet Take 1 tablet (100 mg total) by mouth daily. 30 tablet 6   traZODone (DESYREL) 100 MG tablet Take 100 mg by mouth at bedtime.     TRUEPLUS LANCETS 26G MISC 1 each by Does not apply route 2 (two) times daily. 100 each 5   atorvastatin (LIPITOR) 20 MG tablet Take 1 tablet (20 mg total) by mouth daily. 30 tablet 6   No current facility-administered medications on file prior to visit.    There are no Patient Instructions on file for this visit. No follow-ups on file.   Kris Hartmann, NP

## 2022-10-13 ENCOUNTER — Encounter (INDEPENDENT_AMBULATORY_CARE_PROVIDER_SITE_OTHER): Payer: Self-pay | Admitting: Nurse Practitioner

## 2022-10-13 ENCOUNTER — Telehealth (INDEPENDENT_AMBULATORY_CARE_PROVIDER_SITE_OTHER): Payer: Self-pay

## 2022-10-13 ENCOUNTER — Ambulatory Visit (INDEPENDENT_AMBULATORY_CARE_PROVIDER_SITE_OTHER): Payer: Medicare Other | Admitting: Nurse Practitioner

## 2022-10-13 VITALS — BP 93/63 | HR 100 | Resp 19 | Ht 67.0 in | Wt 167.0 lb

## 2022-10-13 DIAGNOSIS — T8149XA Infection following a procedure, other surgical site, initial encounter: Secondary | ICD-10-CM

## 2022-10-13 NOTE — Telephone Encounter (Signed)
Called to see if pt was seen today and if there is anything they need to change at dialysis.  I spoke with Arna Medici she said the wound looks great and we will schedule an ultrasound soon as it is good and healed.  I let Gwyndolyn Saxon know what she said and he stated understanding.

## 2022-10-30 ENCOUNTER — Encounter (INDEPENDENT_AMBULATORY_CARE_PROVIDER_SITE_OTHER): Payer: Self-pay | Admitting: Nurse Practitioner

## 2022-10-30 NOTE — Progress Notes (Signed)
Subjective:    Patient ID: [name], male    DOB: 1946-11-26, 76 y.o.   MRN: [id number] No chief complaint on file.   The patient returns today for evaluation of a wound on his left upper extremity following graft placement the patient has some cognitive difficulties which make treatment of his wound difficult.  The patient recently visited the emergency room due to infection in the wound itself.  He received IV antibiotics.  Since that visit the wound is drastically improved.  There is still a small open area however.   Review of Systems  Skin:  Positive for wound.  Psychiatric/Behavioral:  Positive for confusion and decreased concentration.   All other systems reviewed and are negative.      Objective:   Physical Exam Vitals reviewed.  HENT:     Head: Normocephalic.  Cardiovascular:     Rate and Rhythm: Normal rate.     Pulses: Normal pulses.  Pulmonary:     Effort: Pulmonary effort is normal.  Skin:    General: Skin is warm and dry.  Neurological:     Mental Status: He is alert and oriented to person, place, and time.  Psychiatric:        Mood and Affect: Mood normal.        Behavior: Behavior normal.        Thought Content: Thought content normal.        Judgment: Judgment normal.     BP 103/61 (BP Location: Right Arm)   Pulse 89   Resp 18   Ht '5\' 9"'$  (1.753 m)   Wt 167 lb (75.8 kg)   BMI 24.66 kg/m   Past Medical History:  Diagnosis Date   Alcoholism ([place])    Allergy    Altered mental status 2011   hx of 2/2 hypercalcemia, EtOH w/d and medication overuse   Angina    mild pain on arm, left chest, moves down left chest   BPH (benign prostatic hyperplasia)    Chronic kidney disease, stage 4 (severe) (HCC)    Chronic renal insufficiency    with history of acute on chronic secondary to dehydration and hypercalcemia   Dementia (HCC)    Encephalopathy    Fx humeral neck 2008   right side, no history of surgical repair   History of nephrolithiasis  2008   s/p ureterolithotomy    Hypercalcemia    2/2 primary hyperparathyroidism   Hyperkalemia    Hypertension    Pancreatitis 2011   history of several admissions for pancreatitis    Parathyroid adenoma    Primary hyperparathyroidism ([place])    s/p parathyroid adenoma resection Oct. 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[place])     Social History   Socioeconomic History   Marital status: Single    Spouse name: Not on file   Number of children: Not on file   Years of education: Not on file   Highest education level: Not on file  Occupational History   Occupation: disabled  Tobacco Use   Smoking status: Every Day    Packs/day: 1.00    Years: 50.00    Total pack years: 50.00    Types: Cigarettes   Smokeless tobacco: Never  Substance and Sexual Activity   Alcohol use: No  Comment: 03/09/12 "last alcohol 1983"; history of heavy alcohol use   Drug use: No    Types: Marijuana    Comment: 03/09/12 Last drug use "~1978"   Sexual activity: Never  Other Topics Concern   Not on file  Social History Narrative   Patient is disabled. Patient lives with at group home [facility]) Patient has history of being in prison.History of alcoholism   Social Determinants of Radio broadcast assistant Strain: Not on file  Food Insecurity: Not on file  Transportation Needs: Not on file  Physical Activity: Not on file  Stress: Not on file  Social Connections: Not on file  Intimate Partner Violence: Not on file    Past Surgical History:  Procedure Laterality Date   Ankle Pinning     AV FISTULA PLACEMENT Left 08/17/2022   Procedure: INSERTION OF ARTERIOVENOUS (AV) GORE-TEX GRAFT ARM (BRACHIAL AXILLARY);  Surgeon: Algernon Huxley, MD;  Location: [facility];  Service: Vascular;  Laterality: Left;   cysto, removal of  bladder stone, attempted stent placement  08/15/2007   cystoscopy, laser cystolithoplaxy  08/16/2007   DIALYSIS/PERMA CATHETER INSERTION N/A 07/04/2022   Procedure: DIALYSIS/PERMA CATHETER INSERTION;  Surgeon: Algernon Huxley, MD;  Location: [place] [facility];  Service: Cardiovascular;  Laterality: N/A;   KIDNEY STONE SURGERY  07/2007 X2   /E-chart   open left proximal ureterolithotomy  09/18/2007   parathyroid adenoma resection  09/04/2010   residual adenoma Oct. 12, 2011   PARATHYROIDECTOMY  03/09/2012   Procedure: PARATHYROIDECTOMY;  Surgeon: Pedro Earls, MD;  Location: [place];  Service: General;  Laterality: N/A;  mediastinal paraqthyroidectomy    THYROIDECTOMY  03/09/2012    Family History  Problem Relation Age of Onset   Kidney cancer Neg Hx    Bladder Cancer Neg Hx    Prostate cancer Neg Hx     No Known Allergies     Latest Ref Rng & Units 10/02/2022    5:02 AM 10/01/2022   12:37 PM 10/01/2022    5:18 AM  CBC  WBC 4.0 - 10.5 K/uL 6.9  7.7  7.3   Hemoglobin 13.0 - 17.0 g/dL 8.9  10.0  9.1   Hematocrit 39.0 - 52.0 % 27.6  31.7  29.3   Platelets 150 - 400 K/uL 294  339  339       CMP     Component Value Date/Time   NA 141 10/01/2022 1237   NA 135 03/08/2017 1102   K 4.7 10/01/2022 1237   CL 104 10/01/2022 1237   CO2 27 10/01/2022 1237   GLUCOSE 221 (H) 10/01/2022 1237   BUN 48 (H) 10/01/2022 1237   BUN 45 (H) 03/08/2017 1102   CREATININE 6.96 (H) 10/01/2022 1237   CALCIUM 8.7 (L) 10/01/2022 1237   CALCIUM >15.0 (HH) 11/09/2011 1455   PROT 7.5 09/30/2022 1407   ALBUMIN 2.8 (L) 10/01/2022 1237   ALBUMIN 4.1 03/08/2017 1102   AST 32 09/30/2022 1407   ALT 21 09/30/2022 1407   ALKPHOS 59 09/30/2022 1407   BILITOT 0.8 09/30/2022 1407   GFRNONAA 8 (L) 10/01/2022 1237   GFRAA 21 (L) 01/20/2019 0924     No results found.     Assessment & Plan:   1. Postoperative wound infection The patient has noted significant improvement in his wound.  We will secure  the area with some zip stitches.  Patient will return in 2 weeks for reevaluation of the wound   Current Outpatient  Medications on File Prior to Visit  Medication Sig Dispense Refill   calcitRIOL (ROCALTROL) 0.5 MCG capsule Take 1 capsule (0.5 mcg total) by mouth daily. 30 capsule 2   cinacalcet (SENSIPAR) 30 MG tablet Take 30 mg by mouth daily.     cloNIDine (CATAPRES) 0.1 MG tablet Take 0.1 mg by mouth 2 (two) times daily.     denosumab (PROLIA) 60 MG/ML SOSY injection Inject 1 mL into the skin every 6 (six) months.     docusate sodium (COLACE) 100 MG capsule Take 1 capsule (100 mg total) by mouth 2 (two) times daily. (Patient taking differently: Take 100 mg by mouth 2 (two) times daily as needed.) 60 capsule 6   doxazosin (CARDURA) 2 MG tablet Take 2 mg by mouth at bedtime.     ergocalciferol (VITAMIN D2) 50000 units capsule Take 1 capsule (50,000 Units total) by mouth every 6 (six) weeks. 30 capsule 6   finasteride (PROSCAR) 5 MG tablet TAKE ONE TABLET BY MOUTH EVERY DAY FOR ENLARGED PROSTATE (Patient taking differently: Take 5 mg by mouth daily. TAKE ONE TABLET BY MOUTH EVERY DAY FOR ENLARGED PROSTATE) 90 tablet 3   fluticasone (FLONASE) 50 MCG/ACT nasal spray USE 2 SPRAYS IN EACH NOSTRIL DAILY AS NEEDED (Patient taking differently: Place 1 spray into both nostrils daily.) 16 g 11   folic acid (FOLVITE) 1 MG tablet Take 1 tablet (1 mg total) by mouth daily. 30 tablet 6   gabapentin (NEURONTIN) 400 MG capsule Take 400 mg by mouth 2 (two) times daily.     glucose blood test strip 1 each by Other route 2 (two) times daily. Use as instructed 100 each 5   HYDROcodone-acetaminophen (NORCO/VICODIN) 5-325 MG tablet Take 2 tablets by mouth every 6 (six) hours as needed for moderate pain. 20 tablet 0   Insulin Glargine (LANTUS SOLOSTAR) 100 UNIT/ML Solostar Pen Inject 10 Units into the skin Nightly.     MELATONIN TR 1 MG TBCR TAKE 1 TABLET BY MOUTH EVERY NIGHT AT BEDTIME 30 tablet 0   Multiple  Vitamins-Minerals (MULTIVITAMIN WITH MINERALS) tablet TAKE 1 TABLET BY MOUTH DAILY 30 tablet 5   OLANZapine (ZYPREXA) 15 MG tablet Take 15 mg by mouth at bedtime.     pioglitazone (ACTOS) 30 MG tablet Take 30 mg by mouth daily.     thiamine (VITAMIN B-1) 100 MG tablet Take 1 tablet (100 mg total) by mouth daily. 30 tablet 6   traZODone (DESYREL) 100 MG tablet Take 100 mg by mouth at bedtime.     TRUEPLUS LANCETS 26G MISC 1 each by Does not apply route 2 (two) times daily. 100 each 5   atorvastatin (LIPITOR) 20 MG tablet Take 1 tablet (20 mg total) by mouth daily. 30 tablet 6   No current facility-administered medications on file prior to visit.    There are no Patient Instructions on file for this visit. No follow-ups on file.   Kris Hartmann, NP

## 2022-11-03 ENCOUNTER — Ambulatory Visit (INDEPENDENT_AMBULATORY_CARE_PROVIDER_SITE_OTHER): Payer: Medicare Other

## 2022-11-03 ENCOUNTER — Ambulatory Visit (INDEPENDENT_AMBULATORY_CARE_PROVIDER_SITE_OTHER): Payer: Medicare Other | Admitting: Nurse Practitioner

## 2022-11-03 ENCOUNTER — Encounter (INDEPENDENT_AMBULATORY_CARE_PROVIDER_SITE_OTHER): Payer: Self-pay | Admitting: Nurse Practitioner

## 2022-11-03 VITALS — BP 106/67 | HR 95 | Resp 18 | Ht 69.0 in | Wt 162.0 lb

## 2022-11-03 DIAGNOSIS — E1165 Type 2 diabetes mellitus with hyperglycemia: Secondary | ICD-10-CM

## 2022-11-03 DIAGNOSIS — T8149XA Infection following a procedure, other surgical site, initial encounter: Secondary | ICD-10-CM

## 2022-11-03 DIAGNOSIS — N186 End stage renal disease: Secondary | ICD-10-CM | POA: Diagnosis not present

## 2022-11-03 DIAGNOSIS — Z95828 Presence of other vascular implants and grafts: Secondary | ICD-10-CM

## 2022-11-10 ENCOUNTER — Telehealth (INDEPENDENT_AMBULATORY_CARE_PROVIDER_SITE_OTHER): Payer: Self-pay

## 2022-11-11 ENCOUNTER — Telehealth (INDEPENDENT_AMBULATORY_CARE_PROVIDER_SITE_OTHER): Payer: Self-pay

## 2022-11-11 NOTE — Telephone Encounter (Signed)
Spoke with [name] regarding getting the patient scheduled for his left arm fistulagram on 11/17/22 with Dr[facility] with a 10:00 am arrival time. Pre-procedure instructions were discussed and will be faxed to [facility].

## 2022-11-14 ENCOUNTER — Telehealth (INDEPENDENT_AMBULATORY_CARE_PROVIDER_SITE_OTHER): Payer: Self-pay

## 2022-11-14 ENCOUNTER — Encounter (INDEPENDENT_AMBULATORY_CARE_PROVIDER_SITE_OTHER): Payer: Self-pay | Admitting: Nurse Practitioner

## 2022-11-14 NOTE — Telephone Encounter (Signed)
Spoke with [name] regarding the patient and was told that per Will at the dialysis center the patient has should be canceled for a left arm fistulagram as his access is working fine. Patient has been canceled.

## 2022-11-14 NOTE — Progress Notes (Signed)
Subjective:    Patient ID: [name], male    DOB: 1946-03-21, 76 y.o.   MRN: [id number] Chief Complaint  Patient presents with  . Follow-up    ultrasound    HPI  Review of Systems     Objective:   Physical Exam  BP 106/67   Pulse 95   Resp 18   Ht '5\' 9"'$  (1.753 m)   Wt 162 lb (73.5 kg)   BMI 23.92 kg/m   Past Medical History:  Diagnosis Date  . Alcoholism ([place])   . Allergy   . Altered mental status 2011   hx of 2/2 hypercalcemia, EtOH w/d and medication overuse  . Angina    mild pain on arm, left chest, moves down left chest  . BPH (benign prostatic hyperplasia)   . Chronic kidney disease, stage 4 (severe) ([place])   . Chronic renal insufficiency    with history of acute on chronic secondary to dehydration and hypercalcemia  . Dementia ([place])   . Encephalopathy   . Fx humeral neck 2008   right side, no history of surgical repair  . History of nephrolithiasis 2008   s/p ureterolithotomy   . Hypercalcemia    2/2 primary hyperparathyroidism  . Hyperkalemia   . Hypertension   . Pancreatitis 2011   history of several admissions for pancreatitis   . Parathyroid adenoma   . Primary hyperparathyroidism ([place])    s/p parathyroid adenoma resection Oct. 8, 2011, repeat scan showed residual right adenoma, however pt did not f/u with surgery as outpt  . Schizophrenia ([place])    previously on zyprexa  . Tobacco abuse   . 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 Marital status: Single    Spouse name: Not on file  . Number of children: Not on file  . Years of education: Not on file  . Highest education level: Not on file  Occupational History  . Occupation: disabled  Tobacco Use  . Smoking status: Every Day    Packs/day: 1.00    Years: 50.00    Total pack years: 50.00    Types: Cigarettes  . Smokeless tobacco: Never  Substance and Sexual Activity  . Alcohol use: No    Comment: 03/09/12 "last  alcohol 1983"; history of heavy alcohol use  . Drug use: No    Types: Marijuana    Comment: 03/09/12 Last drug use "~1978"  . Sexual activity: Never  Other Topics Concern  . Not on file  Social History Narrative   Patient is disabled. Patient lives with at group home [facility]) Patient has history of being in prison.History of alcoholism   Social Determinants of Radio broadcast assistant Strain: Not on file  Food Insecurity: Not on file  Transportation Needs: Not on file  Physical Activity: Not on file  Stress: Not on file  Social Connections: Not on file  Intimate Partner Violence: Not on file    Past Surgical History:  Procedure Laterality Date  . Ankle Pinning    . AV FISTULA PLACEMENT Left 08/17/2022   Procedure: INSERTION OF ARTERIOVENOUS (AV) GORE-TEX GRAFT ARM (BRACHIAL AXILLARY);  Surgeon: Algernon Huxley, MD;  Location: [facility];  Service: Vascular;  Laterality: Left;  . cysto, removal of bladder stone, attempted stent placement  08/15/2007  . cystoscopy, laser cystolithoplaxy  08/16/2007  . DIALYSIS/PERMA CATHETER INSERTION N/A 07/04/2022   Procedure: DIALYSIS/PERMA CATHETER INSERTION;  Surgeon: Algernon Huxley, MD;  Location: [place] [facility];  Service: Cardiovascular;  Laterality: N/A;  . KIDNEY STONE SURGERY  07/2007 X2   /E-chart  . open left proximal ureterolithotomy  09/18/2007  . parathyroid adenoma resection  09/04/2010   residual adenoma Oct. 12, 2011  . PARATHYROIDECTOMY  03/09/2012   Procedure: PARATHYROIDECTOMY;  Surgeon: Pedro Earls, MD;  Location: [place];  Service: General;  Laterality: N/A;  mediastinal paraqthyroidectomy   . THYROIDECTOMY  03/09/2012    Family History  Problem Relation Age of Onset  . Kidney cancer Neg Hx   . Bladder Cancer Neg Hx   . Prostate cancer Neg Hx     No Known Allergies     Latest Ref Rng & Units 10/02/2022    5:02 AM 10/01/2022   12:37 PM 10/01/2022    5:18 AM  CBC  WBC 4.0 - 10.5 K/uL 6.9  7.7  7.3    Hemoglobin 13.0 - 17.0 g/dL 8.9  10.0  9.1   Hematocrit 39.0 - 52.0 % 27.6  31.7  29.3   Platelets 150 - 400 K/uL 294  339  339       CMP     Component Value Date/Time   NA 141 10/01/2022 1237   NA 135 03/08/2017 1102   K 4.7 10/01/2022 1237   CL 104 10/01/2022 1237   CO2 27 10/01/2022 1237   GLUCOSE 221 (H) 10/01/2022 1237   BUN 48 (H) 10/01/2022 1237   BUN 45 (H) 03/08/2017 1102   CREATININE 6.96 (H) 10/01/2022 1237   CALCIUM 8.7 (L) 10/01/2022 1237   CALCIUM >15.0 (HH) 11/09/2011 1455   PROT 7.5 09/30/2022 1407   ALBUMIN 2.8 (L) 10/01/2022 1237   ALBUMIN 4.1 03/08/2017 1102   AST 32 09/30/2022 1407   ALT 21 09/30/2022 1407   ALKPHOS 59 09/30/2022 1407   BILITOT 0.8 09/30/2022 1407   GFRNONAA 8 (L) 10/01/2022 1237   GFRAA 21 (L) 01/20/2019 0924     No results found.     Assessment & Plan:   1. ESRD (end stage renal disease) ([place]) Recommend:  The patient is experiencing increasing problems with their dialysis access.  Patient should have a fistulagram with the intention for intervention.  The intention for intervention is to restore appropriate flow and prevent thrombosis and possible loss of the access.  As well as improve the quality of dialysis therapy.  Currently the patient has a loop guardian who is not present today.  Will reach out to them to discuss procedure prior to scheduling.  The patient will follow up with me in the office after the procedure.  - VAS US DUPLEX DIALYSIS ACCESS (AVF, AVG)  2. Postoperative wound infection Wound is healed.  No further intervention necessary  3. Uncontrolled type 2 diabetes mellitus with hyperglycemia, without long-term current use of insulin (HCC) Continue hypoglycemic medications as already ordered, these medications have been reviewed and there are no changes at this time.  Hgb A1C to be monitored as already arranged by primary service   Current Outpatient Medications on File Prior to Visit  Medication Sig  Dispense Refill  . atorvastatin (LIPITOR) 20 MG tablet Take 1 tablet (20 mg total) by mouth daily. 30 tablet 6  . calcitRIOL (ROCALTROL) 0.5 MCG capsule Take 1 capsule (0.5 mcg total) by mouth daily. 30 capsule 2  . cinacalcet (SENSIPAR) 30 MG tablet Take 30 mg by mouth daily.    . cloNIDine (CATAPRES) 0.1 MG tablet  Take 0.1 mg by mouth 2 (two) times daily.    [name] denosumab (PROLIA) 60 MG/ML SOSY injection Inject 1 mL into the skin every 6 (six) months.    . docusate sodium (COLACE) 100 MG capsule Take 1 capsule (100 mg total) by mouth 2 (two) times daily. (Patient taking differently: Take 100 mg by mouth 2 (two) times daily as needed.) 60 capsule 6  . doxazosin (CARDURA) 2 MG tablet Take 2 mg by mouth at bedtime.    . ergocalciferol (VITAMIN D2) 50000 units capsule Take 1 capsule (50,000 Units total) by mouth every 6 (six) weeks. 30 capsule 6  . finasteride (PROSCAR) 5 MG tablet TAKE ONE TABLET BY MOUTH EVERY DAY FOR ENLARGED PROSTATE (Patient taking differently: Take 5 mg by mouth daily. TAKE ONE TABLET BY MOUTH EVERY DAY FOR ENLARGED PROSTATE) 90 tablet 3  . fluticasone (FLONASE) 50 MCG/ACT nasal spray USE 2 SPRAYS IN EACH NOSTRIL DAILY AS NEEDED (Patient taking differently: Place 1 spray into both nostrils daily.) 16 g 11  . folic acid (FOLVITE) 1 MG tablet Take 1 tablet (1 mg total) by mouth daily. 30 tablet 6  . gabapentin (NEURONTIN) 400 MG capsule Take 400 mg by mouth 2 (two) times daily.    [name] glucose blood test strip 1 each by Other route 2 (two) times daily. Use as instructed 100 each 5  . HYDROcodone-acetaminophen (NORCO/VICODIN) 5-325 MG tablet Take 2 tablets by mouth every 6 (six) hours as needed for moderate pain. 20 tablet 0  . Insulin Glargine (LANTUS SOLOSTAR) 100 UNIT/ML Solostar Pen Inject 10 Units into the skin Nightly.    [name] MELATONIN TR 1 MG TBCR TAKE 1 TABLET BY MOUTH EVERY NIGHT AT BEDTIME 30 tablet 0  . Multiple Vitamins-Minerals (MULTIVITAMIN WITH MINERALS) tablet TAKE 1  TABLET BY MOUTH DAILY 30 tablet 5  . OLANZapine (ZYPREXA) 15 MG tablet Take 15 mg by mouth at bedtime.    . pioglitazone (ACTOS) 30 MG tablet Take 30 mg by mouth daily.    [name] thiamine (VITAMIN B-1) 100 MG tablet Take 1 tablet (100 mg total) by mouth daily. 30 tablet 6  . traZODone (DESYREL) 100 MG tablet Take 100 mg by mouth at bedtime.    . TRUEPLUS LANCETS 26G MISC 1 each by Does not apply route 2 (two) times daily. 100 each 5   No current facility-administered medications on file prior to visit.    There are no Patient Instructions on file for this visit. No follow-ups on file.   Kris Hartmann, NP

## 2022-11-14 NOTE — Telephone Encounter (Signed)
[id number]

## 2022-11-14 NOTE — Telephone Encounter (Signed)
[facility] receive dx code

## 2022-11-17 ENCOUNTER — Ambulatory Visit: Admission: RE | Admit: 2022-11-17 | Payer: Medicare Other | Source: Home / Self Care | Admitting: Vascular Surgery

## 2022-11-17 ENCOUNTER — Encounter: Admission: RE | Payer: Self-pay | Source: Home / Self Care

## 2022-11-17 DIAGNOSIS — N186 End stage renal disease: Secondary | ICD-10-CM

## 2022-11-17 SURGERY — A/V FISTULAGRAM
Anesthesia: Moderate Sedation | Laterality: Left

## 2022-11-24 ENCOUNTER — Telehealth (INDEPENDENT_AMBULATORY_CARE_PROVIDER_SITE_OTHER): Payer: Self-pay

## 2022-11-24 NOTE — Telephone Encounter (Signed)
[name] called and stated he did not want patient's appointment on 11/17/22 to be cancelled.  He would like to get that appointment rescheduled.  Please advise.

## 2022-11-30 ENCOUNTER — Telehealth (INDEPENDENT_AMBULATORY_CARE_PROVIDER_SITE_OTHER): Payer: Self-pay

## 2022-11-30 NOTE — Telephone Encounter (Signed)
Wil from [facility] called wanting the patient rescheduled for his shuntogram with Dr. Lucky Cowboy. Patient has been rescheduled to 12/08/22 with [facility] 6:45 [facility]. Pre-procedure instructions will be faxed to Will at [facility] as requested.

## 2022-12-02 NOTE — Telephone Encounter (Signed)
Received a call from Will at [facility] wanting to move the patient to another Thursday. I only have available 12/22/22 as I have been told that 12/15/22 was blocked for scheduling patients that are not inpatients. Patient will be moved to 12/22/22 with a 8:15 am [facility]. Pre-procedure instructions will be faxed to [facility] at [facility].

## 2022-12-08 DIAGNOSIS — N186 End stage renal disease: Secondary | ICD-10-CM

## 2022-12-22 ENCOUNTER — Ambulatory Visit
Admission: RE | Admit: 2022-12-22 | Discharge: 2022-12-22 | Disposition: A | Discharge status: Home / Self Care | Payer: Medicare Other | Attending: Vascular Surgery | Admitting: Vascular Surgery

## 2022-12-22 ENCOUNTER — Encounter
Admission: RE | Disposition: A | Discharge status: Home / Self Care | Payer: Self-pay | Source: Home / Self Care | Attending: Vascular Surgery

## 2022-12-22 DIAGNOSIS — N186 End stage renal disease: Secondary | ICD-10-CM

## 2022-12-22 SURGERY — A/V FISTULAGRAM
Anesthesia: Moderate Sedation | Laterality: Left

## 2022-12-22 MED ORDER — ONDANSETRON HCL 4 MG/2ML IJ SOLN: 4.0000 mg | Freq: Four times a day (QID) | INTRAMUSCULAR | Status: DC | PRN

## 2022-12-22 MED ORDER — MIDAZOLAM HCL 2 MG/ML PO SYRP: 8.0000 mg | ORAL_SOLUTION | Freq: Once | ORAL | Status: DC | PRN

## 2022-12-22 MED ORDER — FAMOTIDINE 20 MG PO TABS: 40.0000 mg | ORAL_TABLET | Freq: Once | ORAL | Status: DC | PRN

## 2022-12-22 MED ORDER — CEFAZOLIN SODIUM-DEXTROSE 1-4 GM/50ML-% IV SOLN: 1.0000 g | INTRAVENOUS | Status: DC

## 2022-12-22 MED ORDER — SODIUM CHLORIDE 0.9 % IV SOLN: INTRAVENOUS | Status: DC

## 2022-12-22 MED ORDER — CEFAZOLIN SODIUM-DEXTROSE 1-4 GM/50ML-% IV SOLN
INTRAVENOUS | Status: AC
  Filled 2022-12-22: qty 50

## 2022-12-22 MED ORDER — DIPHENHYDRAMINE HCL 50 MG/ML IJ SOLN: 50.0000 mg | Freq: Once | INTRAMUSCULAR | Status: DC | PRN

## 2022-12-22 MED ORDER — METHYLPREDNISOLONE SODIUM SUCC 125 MG IJ SOLR: 125.0000 mg | Freq: Once | INTRAMUSCULAR | Status: DC | PRN

## 2022-12-22 MED ORDER — HYDROMORPHONE HCL 1 MG/ML IJ SOLN: 1.0000 mg | Freq: Once | INTRAMUSCULAR | Status: DC | PRN

## 2022-12-22 NOTE — Progress Notes (Signed)
According to caregiver with Mr Durnil, patient ate deli ham this morning for breakfast. Notified Dr Lucky Cowboy and plan to cancel procedure for today and reschedule for another day. Mickel Baas at [facility] notified of need to reschedule

## 2022-12-28 ENCOUNTER — Telehealth (INDEPENDENT_AMBULATORY_CARE_PROVIDER_SITE_OTHER): Payer: Self-pay

## 2022-12-28 NOTE — Telephone Encounter (Signed)
I received a call from [place] at [facility] to reschedule the patient's left arm shuntogram with Dr. Lucky Cowboy. Patient has been rescheduled to 01/09/23 with a 8:15 am arrival time to the [facility]. 01/02/23 was offered and declined. Pre-procedure instructions will be faxed to [facility] at [facility] per his request.

## 2023-01-06 ENCOUNTER — Telehealth (INDEPENDENT_AMBULATORY_CARE_PROVIDER_SITE_OTHER): Payer: Self-pay

## 2023-01-06 NOTE — Telephone Encounter (Signed)
Spoke with Billey Gosling at [facility] and the patient has been rescheduled to a 11:00 am [facility] for his left arm shuntogram with Dr. Lucky Cowboy. Instructions will be faxed to [facility].

## 2023-01-12 ENCOUNTER — Encounter: Payer: Self-pay | Admitting: Vascular Surgery

## 2023-01-12 ENCOUNTER — Other Ambulatory Visit: Payer: Self-pay

## 2023-01-12 ENCOUNTER — Encounter
Admission: RE | Disposition: A | Discharge status: Home / Self Care | Payer: Self-pay | Source: Home / Self Care | Attending: Vascular Surgery

## 2023-01-12 ENCOUNTER — Ambulatory Visit
Admission: RE | Admit: 2023-01-12 | Discharge: 2023-01-12 | Disposition: A | Discharge status: Home / Self Care | Payer: Medicare Other | Attending: Vascular Surgery | Admitting: Vascular Surgery

## 2023-01-12 DIAGNOSIS — F17210 Nicotine dependence, cigarettes, uncomplicated: Secondary | ICD-10-CM

## 2023-01-12 DIAGNOSIS — N186 End stage renal disease: Secondary | ICD-10-CM | POA: Diagnosis not present

## 2023-01-12 DIAGNOSIS — T82858A Stenosis of vascular prosthetic devices, implants and grafts, initial encounter: Secondary | ICD-10-CM | POA: Insufficient documentation

## 2023-01-12 DIAGNOSIS — Z992 Dependence on renal dialysis: Secondary | ICD-10-CM | POA: Diagnosis not present

## 2023-01-12 DIAGNOSIS — E1122 Type 2 diabetes mellitus with diabetic chronic kidney disease: Secondary | ICD-10-CM | POA: Diagnosis not present

## 2023-01-12 DIAGNOSIS — Y841 Kidney dialysis as the cause of abnormal reaction of the patient, or of later complication, without mention of misadventure at the time of the procedure: Secondary | ICD-10-CM | POA: Insufficient documentation

## 2023-01-12 DIAGNOSIS — T82868A Thrombosis of vascular prosthetic devices, implants and grafts, initial encounter: Secondary | ICD-10-CM | POA: Diagnosis not present

## 2023-01-12 DIAGNOSIS — I120 Hypertensive chronic kidney disease with stage 5 chronic kidney disease or end stage renal disease: Secondary | ICD-10-CM | POA: Diagnosis not present

## 2023-01-12 HISTORY — PX: A/V SHUNTOGRAM: CATH118297

## 2023-01-12 LAB — GLUCOSE, CAPILLARY: Glucose-Capillary: 98 mg/dL (ref 70–99)

## 2023-01-12 LAB — POTASSIUM (ARMC VASCULAR LAB ONLY): Potassium (ARMC vascular lab): 4.9 mmol/L (ref 3.5–5.1)

## 2023-01-12 SURGERY — A/V SHUNTOGRAM
Anesthesia: Moderate Sedation | Laterality: Left

## 2023-01-12 MED ORDER — CEFAZOLIN SODIUM-DEXTROSE 2-4 GM/100ML-% IV SOLN
2.0000 g | INTRAVENOUS | Status: AC
  Administered 2023-01-12: 2 g via INTRAVENOUS

## 2023-01-12 MED ORDER — CEFAZOLIN SODIUM-DEXTROSE 2-4 GM/100ML-% IV SOLN
INTRAVENOUS | Status: AC
  Filled 2023-01-12: qty 100

## 2023-01-12 MED ORDER — FENTANYL CITRATE (PF) 100 MCG/2ML IJ SOLN
INTRAMUSCULAR | Status: AC
  Filled 2023-01-12: qty 2

## 2023-01-12 MED ORDER — METHYLPREDNISOLONE SODIUM SUCC 125 MG IJ SOLR: 125.0000 mg | Freq: Once | INTRAMUSCULAR | Status: DC | PRN

## 2023-01-12 MED ORDER — SODIUM CHLORIDE 0.9 % IV SOLN: INTRAVENOUS | Status: DC

## 2023-01-12 MED ORDER — HEPARIN SODIUM (PORCINE) 1000 UNIT/ML IJ SOLN
INTRAMUSCULAR | Status: AC
  Filled 2023-01-12: qty 10

## 2023-01-12 MED ORDER — IODIXANOL 320 MG/ML IV SOLN
INTRAVENOUS | Status: DC | PRN
  Administered 2023-01-12: 30 mL via INTRA_ARTERIAL

## 2023-01-12 MED ORDER — MIDAZOLAM HCL 2 MG/2ML IJ SOLN
INTRAMUSCULAR | Status: DC | PRN
  Administered 2023-01-12: 2 mg via INTRAVENOUS

## 2023-01-12 MED ORDER — DIPHENHYDRAMINE HCL 50 MG/ML IJ SOLN: 50.0000 mg | Freq: Once | INTRAMUSCULAR | Status: DC | PRN

## 2023-01-12 MED ORDER — HEPARIN SODIUM (PORCINE) 1000 UNIT/ML IJ SOLN
INTRAMUSCULAR | Status: DC | PRN
  Administered 2023-01-12: 3000 [IU] via INTRAVENOUS

## 2023-01-12 MED ORDER — ONDANSETRON HCL 4 MG/2ML IJ SOLN: 4.0000 mg | Freq: Four times a day (QID) | INTRAMUSCULAR | Status: DC | PRN

## 2023-01-12 MED ORDER — MIDAZOLAM HCL 2 MG/ML PO SYRP: 8.0000 mg | ORAL_SOLUTION | Freq: Once | ORAL | Status: DC | PRN

## 2023-01-12 MED ORDER — HEPARIN SODIUM (PORCINE) 10000 UNIT/ML IJ SOLN
INTRAMUSCULAR | Status: AC
  Filled 2023-01-12: qty 1

## 2023-01-12 MED ORDER — MIDAZOLAM HCL 2 MG/2ML IJ SOLN
INTRAMUSCULAR | Status: AC
  Filled 2023-01-12: qty 2

## 2023-01-12 MED ORDER — FENTANYL CITRATE (PF) 100 MCG/2ML IJ SOLN
INTRAMUSCULAR | Status: DC | PRN
  Administered 2023-01-12: 50 ug via INTRAVENOUS

## 2023-01-12 MED ORDER — FAMOTIDINE 20 MG PO TABS: 40.0000 mg | ORAL_TABLET | Freq: Once | ORAL | Status: DC | PRN

## 2023-01-12 MED ORDER — HYDROMORPHONE HCL 1 MG/ML IJ SOLN: 1.0000 mg | Freq: Once | INTRAMUSCULAR | Status: DC | PRN

## 2023-01-12 SURGICAL SUPPLY — 10 items
BALLN LUTONIX AV 7X60X75 (BALLOONS) ×1
BALLOON LUTONIX AV 7X60X75 (BALLOONS) IMPLANT
CANNULA 5F STIFF (CANNULA) IMPLANT
COVER PROBE ULTRASOUND 5X96 (MISCELLANEOUS) IMPLANT
DRAPE BRACHIAL (DRAPES) IMPLANT
GLIDEWIRE ADV .035X180CM (WIRE) IMPLANT
KIT ENCORE 26 ADVANTAGE (KITS) IMPLANT
PACK ANGIOGRAPHY (CUSTOM PROCEDURE TRAY) ×1 IMPLANT
SHEATH BRITE TIP 6FRX5.5 (SHEATH) IMPLANT
SUT MNCRL AB 4-0 PS2 18 (SUTURE) IMPLANT

## 2023-01-12 NOTE — H&P (Signed)
[facility] Admission History & Physical  MRN : [id number]  [name] is a 77 y.o. ([birth year]) male who presents with chief complaint of No chief complaint on file. [name]  History of Present Illness: I am asked to evaluate the patient by the dialysis center. The patient was sent here because they were unable to achieve adequate dialysis this morning. Furthermore the Center states there is very poor thrill and whistling bruit. The patient has been using his PermCath for dialysis.  A previous duplex study showed elevated velocities at the outflow of the AV graft consistent with hemodynamically significant stenosis Patient denies pain or tenderness overlying the access.  There is no pain with dialysis.  The patient denies hand pain or finger pain consistent with steal syndrome.    There have not been any past interventions or declots of this access.  The patient is Not chronically hypotensive on dialysis.  No current facility-administered medications for this encounter.    Past Medical History:  Diagnosis Date   Alcoholism ([place])    Allergy    Altered mental status 2011   hx of 2/2 hypercalcemia, EtOH w/d and medication overuse   Angina    mild pain on arm, left chest, moves down left chest   BPH (benign prostatic hyperplasia)    Chronic kidney disease, stage 4 (severe) (HCC)    Chronic renal insufficiency    with history of acute on chronic secondary to dehydration and hypercalcemia   Dementia (HCC)    Encephalopathy    Fx humeral neck 2008   right side, no history of surgical repair   History of nephrolithiasis 2008   s/p ureterolithotomy    Hypercalcemia    2/2 primary hyperparathyroidism   Hyperkalemia    Hypertension    Pancreatitis 2011   history of several admissions for pancreatitis    Parathyroid adenoma    Primary hyperparathyroidism ([place])    s/p parathyroid adenoma resection Oct. 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[place])     Past Surgical History:  Procedure Laterality Date   Ankle Pinning     AV FISTULA PLACEMENT Left 08/17/2022   Procedure: INSERTION OF ARTERIOVENOUS (AV) GORE-TEX GRAFT ARM (BRACHIAL AXILLARY);  Surgeon: Algernon Huxley, MD;  Location: [facility];  Service: Vascular;  Laterality: Left;   cysto, removal of bladder stone, attempted stent placement  08/15/2007   cystoscopy, laser cystolithoplaxy  08/16/2007   DIALYSIS/PERMA CATHETER INSERTION N/A 07/04/2022   Procedure: DIALYSIS/PERMA CATHETER INSERTION;  Surgeon: Algernon Huxley, MD;  Location: [facility];  Service: Cardiovascular;  Laterality: N/A;   KIDNEY STONE SURGERY  07/2007 X2   /E-chart   open left proximal ureterolithotomy  09/18/2007   parathyroid adenoma resection  09/04/2010   residual adenoma Oct. 12, 2011   PARATHYROIDECTOMY  03/09/2012   Procedure: PARATHYROIDECTOMY;  Surgeon: Pedro Earls, MD;  Location: [facility];  Service: General;  Laterality: N/A;  mediastinal paraqthyroidectomy    THYROIDECTOMY  03/09/2012    Social History   Tobacco Use   Smoking status: Every Day    Packs/day: 1.00    Years: 50.00    Total pack years: 50.00    Types: Cigarettes   Smokeless tobacco: Never  Substance Use Topics   Alcohol use:  No    Comment: 03/09/12 "last alcohol 1983"; history of heavy alcohol use   Drug use: No    Types: Marijuana    Comment: 03/09/12 Last drug use "~1978"     Family History  Problem Relation Age of Onset   Kidney cancer Neg Hx    Bladder Cancer Neg Hx    Prostate cancer Neg Hx     No family history of bleeding or clotting disorders, autoimmune disease or porphyria  No Known Allergies   REVIEW OF SYSTEMS (Negative unless checked)  Constitutional: []$ Weight loss  []$ Fever  []$ Chills Cardiac: []$ Chest pain   []$ Chest pressure    []$ Palpitations   []$ Shortness of breath when laying flat   []$ Shortness of breath at rest   [x]$ Shortness of breath with exertion. Vascular:  []$ Pain in legs with walking   []$ Pain in legs at rest   []$ Pain in legs when laying flat   []$ Claudication   []$ Pain in feet when walking  []$ Pain in feet at rest  []$ Pain in feet when laying flat   []$ History of DVT   []$ Phlebitis   []$ Swelling in legs   []$ Varicose veins   []$ Non-healing ulcers Pulmonary:   []$ Uses home oxygen   []$ Productive cough   []$ Hemoptysis   []$ Wheeze  []$ COPD   []$ Asthma Neurologic:  []$ Dizziness  []$ Blackouts   []$ Seizures   []$ History of stroke   []$ History of TIA  []$ Aphasia   []$ Temporary blindness   []$ Dysphagia   []$ Weakness or numbness in arms   []$ Weakness or numbness in legs Musculoskeletal:  [x]$ Arthritis   []$ Joint swelling   []$ Joint pain   []$ Low back pain Hematologic:  []$ Easy bruising  []$ Easy bleeding   []$ Hypercoagulable state   [x]$ Anemic  []$ Hepatitis Gastrointestinal:  []$ Blood in stool   []$ Vomiting blood  [x]$ Gastroesophageal reflux/heartburn   []$ Difficulty swallowing. Genitourinary:  [x]$ Chronic kidney disease   []$ Difficult urination  []$ Frequent urination  []$ Burning with urination   []$ Blood in urine Skin:  []$ Rashes   []$ Ulcers   []$ Wounds Psychological:  []$ History of anxiety   [x]$  History of major depression.  Physical Examination  There were no vitals filed for this visit. There is no height or weight on file to calculate BMI. Gen: WD/WN, NAD Head: [place]/AT, No temporalis wasting.  Ear/Nose/Throat: Hearing grossly intact, nares w/o erythema or drainage, oropharynx w/o Erythema/Exudate,  Eyes: Conjunctiva clear, sclera non-icteric Neck: Trachea midline.  No JVD.  Pulmonary:  Good air movement, respirations not labored, no use of accessory muscles.  Cardiac: RRR, normal S1, S2. Vascular: Bruit is present in the AV graft with high pitch. Vessel Right Left  Radial Palpable Palpable   Musculoskeletal: M/S 5/5 throughout.  Extremities without ischemic  changes.  Neurologic: Sensation grossly intact in extremities.  Symmetrical.  Speech is fluent. Motor exam as listed above. Psychiatric: Judgment intact, Mood & affect appropriate for pt's clinical situation. Dermatologic: No rashes or ulcers noted.  No cellulitis or open wounds.    CBC Lab Results  Component Value Date   WBC 6.9 10/02/2022   HGB 8.9 (L) 10/02/2022   HCT 27.6 (L) 10/02/2022   MCV 93.6 10/02/2022   PLT 294 10/02/2022    BMET    Component Value Date/Time   NA 141 10/01/2022 1237   NA 135 03/08/2017 1102   K 4.7 10/01/2022 1237   CL 104 10/01/2022 1237   CO2 27 10/01/2022 1237   GLUCOSE 221 (H) 10/01/2022 1237   BUN 48 (H) 10/01/2022 1237   BUN 45 (H) 03/08/2017 1102   CREATININE  6.96 (H) 10/01/2022 1237   CALCIUM 8.7 (L) 10/01/2022 1237   CALCIUM >15.0 (HH) 11/09/2011 1455   GFRNONAA 8 (L) 10/01/2022 1237   GFRAA 21 (L) 01/20/2019 0924   CrCl cannot be calculated (Patient's most recent lab result is older than the maximum 21 days allowed.).  COAG Lab Results  Component Value Date   INR 1.15 01/04/2012   INR 1.03 11/09/2011   INR 0.91 08/15/2010    Radiology No results found.  Assessment/Plan 1.  Complication dialysis device AV access:  Patient's dialysis access is malfunctioning. The patient will undergo angiography and correction of any problems using interventional techniques with the hope of restoring function to the access.  The risks and benefits were described to the patient.  All questions were answered.  The patient agrees to proceed with angiography and intervention. Potassium will be drawn to ensure that it is an appropriate level prior to performing intervention. 2.  End-stage renal disease requiring hemodialysis:  Patient will continue dialysis therapy without further interruption. Has a permcath as well 3.  Hypertension:  Patient will continue medical management; nephrology is following no changes in oral medications. 4. Diabetes  mellitus:  Glucose will be monitored and oral medications been held this morning once the patient has undergone the patient's procedure po intake will be reinitiated and again Accu-Cheks will be used to assess the blood glucose level and treat as needed. The patient will be restarted on the patient's usual hypoglycemic regime     [name], MD  01/12/2023 11:25 AM

## 2023-01-12 NOTE — Op Note (Signed)
[name]    OPERATIVE NOTE   PROCEDURE: 1.  Left brachial artery to axillary vein arteriovenous graft cannulation under ultrasound guidance 2.  Left arm shuntogram 3.  Percutaneous transluminal angioplasty of the venous anastomosis of the AV graft to the axillary vein with 7 mm diameter by 6 cm length Lutonix drug-coated angioplasty balloon  PRE-OPERATIVE DIAGNOSIS: 1. ESRD 2. Malfunctioning left brachial artery to axillary vein arteriovenous graft  POST-OPERATIVE DIAGNOSIS: same as above   SURGEON: [name], MD  ANESTHESIA: local with MCS  ESTIMATED BLOOD LOSS: 3 cc  FINDING(S): 75 to 80% stenosis of the venous anastomosis to the axillary vein.  The remainder of the graft and the central venous circulation was widely patent.  SPECIMEN(S):  None  CONTRAST: 30 cc  FLUORO TIME: 0.9 minutes  MODERATE CONSCIOUS SEDATION TIME:  Approximately 22 minutes using 2 mg of Versed and 50 mcg of Fentanyl  INDICATIONS: [name] is a 77 y.o. male who presents with malfunctioning left brachial artery to axillary vein arteriovenous graft.  The patient is scheduled for left arm shuntogram.  The patient is aware the risks include but are not limited to: bleeding, infection, thrombosis of the cannulated access, and possible anaphylactic reaction to the contrast.  The patient is aware of the risks of the procedure and elects to proceed forward.  DESCRIPTION: After full informed written consent was obtained, the patient was brought back to the angiography suite and placed supine upon the angiography table.  The patient was connected to monitoring equipment. Moderate conscious sedation was administered during a face to face encounter throughout the procedure with my supervision of the RN administering medicines and monitoring the patient's vital signs, pulse oximetry, telemetry and mental status throughout from the start of the procedure until the patient was taken  to the recovery room The left arm was prepped and draped in the standard fashion for a percutaneous access intervention.  Under ultrasound guidance, the left brachial artery to axillary vein arteriovenous graft was cannulated with a micropuncture needle under direct ultrasound guidance were it was patent and a permanent image was performed.  The microwire was advanced into the graft and the needle was exchanged for the a microsheath.  I then upsized to a 6 Fr Sheath and imaging was performed.  Hand injections were completed to image the access including the central venous system. This demonstrated 75 to 80% stenosis of the venous anastomosis to the axillary vein.  The remainder of the graft and the central venous circulation was widely patent..  Based on the images, this patient will need intervention to this venous anastomotic stenosis. I then gave the patient 3000 units of intravenous heparin.  I then crossed the stenosis with a Terumo Advantage wire.  Based on the imaging, a 7 mm x 6 cm  Lutonix drug coated angioplasty balloon was selected.  The balloon was centered around the stenosis and inflated to 10 ATM for 1 minute(s).  On completion imaging, a 10% residual stenosis was present.     Based on the completion imaging, no further intervention is necessary.  The wire and balloon were removed from the sheath.  A 4-0 Monocryl purse-string suture was sewn around the sheath.  The sheath was removed while tying down the suture.  A sterile bandage was applied to the puncture site.  COMPLICATIONS: None  CONDITION: Stable   [name]  01/12/2023 2:45 PM    This note was created with Dragon Medical transcription system.  Any errors in dictation are purely unintentional.

## 2023-01-13 ENCOUNTER — Encounter: Payer: Self-pay | Admitting: Vascular Surgery

## 2023-01-19 ENCOUNTER — Telehealth (INDEPENDENT_AMBULATORY_CARE_PROVIDER_SITE_OTHER): Payer: Self-pay

## 2023-01-19 NOTE — Telephone Encounter (Signed)
Spoke with Will at [facility] regarding the patient having a permcath removal. Patient scheduled with Dr. Lucky Cowboy on 01/23/23 with a 3:00 pm [facility]. Pre-procedure instructions will be faxed to Will at [facility].

## 2023-01-23 ENCOUNTER — Ambulatory Visit: Admission: RE | Admit: 2023-01-23 | Payer: Medicare Other | Source: Home / Self Care | Admitting: Vascular Surgery

## 2023-01-23 ENCOUNTER — Encounter: Admission: RE | Payer: Self-pay | Source: Home / Self Care

## 2023-01-23 DIAGNOSIS — N186 End stage renal disease: Secondary | ICD-10-CM

## 2023-01-23 SURGERY — DIALYSIS/PERMA CATHETER REMOVAL
Anesthesia: LOCAL

## 2023-02-17 ENCOUNTER — Other Ambulatory Visit (INDEPENDENT_AMBULATORY_CARE_PROVIDER_SITE_OTHER): Payer: Self-pay | Admitting: Vascular Surgery

## 2023-02-17 DIAGNOSIS — Z9862 Peripheral vascular angioplasty status: Secondary | ICD-10-CM

## 2023-02-17 DIAGNOSIS — N186 End stage renal disease: Secondary | ICD-10-CM

## 2023-02-23 ENCOUNTER — Ambulatory Visit (INDEPENDENT_AMBULATORY_CARE_PROVIDER_SITE_OTHER): Payer: Medicare Other | Admitting: Nurse Practitioner

## 2023-02-23 ENCOUNTER — Inpatient Hospital Stay
Admission: EM | Admit: 2023-02-23 | Discharge: 2023-03-02 | DRG: 682 | Disposition: A | Discharge status: Home Health | Payer: Medicare Other | Attending: Internal Medicine | Admitting: Internal Medicine

## 2023-02-23 ENCOUNTER — Encounter (INDEPENDENT_AMBULATORY_CARE_PROVIDER_SITE_OTHER): Payer: Medicare Other

## 2023-02-23 ENCOUNTER — Encounter: Payer: Self-pay | Admitting: *Deleted

## 2023-02-23 ENCOUNTER — Other Ambulatory Visit: Payer: Self-pay

## 2023-02-23 DIAGNOSIS — Z992 Dependence on renal dialysis: Secondary | ICD-10-CM

## 2023-02-23 DIAGNOSIS — F4325 Adjustment disorder with mixed disturbance of emotions and conduct: Secondary | ICD-10-CM | POA: Diagnosis not present

## 2023-02-23 DIAGNOSIS — F17210 Nicotine dependence, cigarettes, uncomplicated: Secondary | ICD-10-CM | POA: Diagnosis present

## 2023-02-23 DIAGNOSIS — N2581 Secondary hyperparathyroidism of renal origin: Secondary | ICD-10-CM | POA: Diagnosis not present

## 2023-02-23 DIAGNOSIS — Z91158 Patient's noncompliance with renal dialysis for other reason: Secondary | ICD-10-CM

## 2023-02-23 DIAGNOSIS — N186 End stage renal disease: Secondary | ICD-10-CM | POA: Diagnosis present

## 2023-02-23 DIAGNOSIS — K5909 Other constipation: Secondary | ICD-10-CM

## 2023-02-23 DIAGNOSIS — I10 Essential (primary) hypertension: Secondary | ICD-10-CM | POA: Diagnosis present

## 2023-02-23 DIAGNOSIS — F0390 Unspecified dementia without behavioral disturbance: Secondary | ICD-10-CM | POA: Diagnosis present

## 2023-02-23 DIAGNOSIS — M6281 Muscle weakness (generalized): Secondary | ICD-10-CM | POA: Diagnosis present

## 2023-02-23 DIAGNOSIS — R2681 Unsteadiness on feet: Secondary | ICD-10-CM | POA: Diagnosis not present

## 2023-02-23 DIAGNOSIS — E1122 Type 2 diabetes mellitus with diabetic chronic kidney disease: Secondary | ICD-10-CM | POA: Diagnosis not present

## 2023-02-23 DIAGNOSIS — F209 Schizophrenia, unspecified: Secondary | ICD-10-CM | POA: Diagnosis not present

## 2023-02-23 DIAGNOSIS — H9190 Unspecified hearing loss, unspecified ear: Secondary | ICD-10-CM | POA: Diagnosis present

## 2023-02-23 DIAGNOSIS — F29 Unspecified psychosis not due to a substance or known physiological condition: Secondary | ICD-10-CM

## 2023-02-23 DIAGNOSIS — Z794 Long term (current) use of insulin: Secondary | ICD-10-CM

## 2023-02-23 DIAGNOSIS — I120 Hypertensive chronic kidney disease with stage 5 chronic kidney disease or end stage renal disease: Secondary | ICD-10-CM | POA: Diagnosis not present

## 2023-02-23 DIAGNOSIS — D631 Anemia in chronic kidney disease: Secondary | ICD-10-CM | POA: Diagnosis not present

## 2023-02-23 DIAGNOSIS — Z7984 Long term (current) use of oral hypoglycemic drugs: Secondary | ICD-10-CM

## 2023-02-23 DIAGNOSIS — N400 Enlarged prostate without lower urinary tract symptoms: Secondary | ICD-10-CM | POA: Diagnosis present

## 2023-02-23 DIAGNOSIS — Z79899 Other long term (current) drug therapy: Secondary | ICD-10-CM

## 2023-02-23 DIAGNOSIS — D351 Benign neoplasm of parathyroid gland: Secondary | ICD-10-CM | POA: Diagnosis not present

## 2023-02-23 DIAGNOSIS — F5101 Primary insomnia: Secondary | ICD-10-CM

## 2023-02-23 DIAGNOSIS — E890 Postprocedural hypothyroidism: Secondary | ICD-10-CM | POA: Diagnosis not present

## 2023-02-23 DIAGNOSIS — E8770 Fluid overload, unspecified: Secondary | ICD-10-CM | POA: Diagnosis not present

## 2023-02-23 DIAGNOSIS — E119 Type 2 diabetes mellitus without complications: Secondary | ICD-10-CM

## 2023-02-23 LAB — BASIC METABOLIC PANEL
Anion gap: 8 (ref 5–15)
BUN: 66 mg/dL — ABNORMAL HIGH (ref 8–23)
CO2: 23 mmol/L (ref 22–32)
Calcium: 9.5 mg/dL (ref 8.9–10.3)
Chloride: 110 mmol/L (ref 98–111)
Creatinine, Ser: 3.92 mg/dL — ABNORMAL HIGH (ref 0.61–1.24)
GFR, Estimated: 15 mL/min — ABNORMAL LOW (ref >60)
Glucose, Bld: 111 mg/dL — ABNORMAL HIGH (ref 70–99)
Potassium: 5 mmol/L (ref 3.5–5.1)
Sodium: 141 mmol/L (ref 135–145)

## 2023-02-23 LAB — CBC WITH DIFFERENTIAL/PLATELET
Abs Immature Granulocytes: 0.01 10*3/uL (ref 0.00–0.07)
Basophils Absolute: 0 10*3/uL (ref 0.0–0.1)
Basophils Relative: 0 %
Eosinophils Absolute: 0.1 10*3/uL (ref 0.0–0.5)
Eosinophils Relative: 1 %
HCT: 39.4 % (ref 39.0–52.0)
Hemoglobin: 12.2 g/dL — ABNORMAL LOW (ref 13.0–17.0)
Immature Granulocytes: 0 %
Lymphocytes Relative: 29 %
Lymphs Abs: 1.9 10*3/uL (ref 0.7–4.0)
MCH: 29.3 pg (ref 26.0–34.0)
MCHC: 31 g/dL (ref 30.0–36.0)
MCV: 94.5 fL (ref 80.0–100.0)
Monocytes Absolute: 0.6 10*3/uL (ref 0.1–1.0)
Monocytes Relative: 9 %
Neutro Abs: 3.8 10*3/uL (ref 1.7–7.7)
Neutrophils Relative %: 61 %
Platelets: 195 10*3/uL (ref 150–400)
RBC: 4.17 MIL/uL — ABNORMAL LOW (ref 4.22–5.81)
RDW: 17.4 % — ABNORMAL HIGH (ref 11.5–15.5)
WBC: 6.4 10*3/uL (ref 4.0–10.5)
nRBC: 0 % (ref 0.0–0.2)

## 2023-02-23 NOTE — ED Notes (Signed)
IVC 

## 2023-02-23 NOTE — ED Triage Notes (Signed)
Pt brought in by [employer].  Pt is IVC.  Pt has hx schizophrenia.  Pt has not been going to dialysis.  Pt denies SI or HI.  Pt denies drugs or etoh use.

## 2023-02-23 NOTE — ED Provider Notes (Signed)
[facility] Provider Note    Event Date/Time   First MD Initiated Contact with Patient 02/23/23 2145     (approximate)   History   Psychiatric Evaluation   HPI  [name] is a 77 y.o. male with a history of schizophrenia who presents to the ER under IVC from facility after patient is refusing dialysis.  Reportedly talking about going to visit aliens as well.  Patient not providing much additional history.     Physical Exam   Triage Vital Signs: ED Triage Vitals  Enc Vitals Group     BP 02/23/23 2114 (!) 170/85     Pulse Rate 02/23/23 2114 88     Resp 02/23/23 2114 18     Temp 02/23/23 2114 98.3 F (36.8 C)     Temp Source 02/23/23 2114 Oral     SpO2 02/23/23 2114 100 %     Weight 02/23/23 2109 160 lb 15 oz (73 kg)     Height 02/23/23 2109 5\' 9"  (1.753 m)     Head Circumference --      Peak Flow --      Pain Score 02/23/23 2109 0     Pain Loc --      Pain Edu? --      Excl. in [place]? --     Most recent vital signs: Vitals:   02/23/23 2114 02/23/23 2302  BP: (!) 170/85   Pulse: 88   Resp: 18   Temp: 98.3 F (36.8 C)   SpO2: 100% 100%     Constitutional: Alert  Eyes: Conjunctivae are normal.  Head: Atraumatic. Nose: No congestion/rhinnorhea. Mouth/Throat: Mucous membranes are moist.   Neck: Painless ROM.  Cardiovascular:   Good peripheral circulation. Respiratory: Normal respiratory effort.  No retractions.  Gastrointestinal: Soft and nontender.  Musculoskeletal:  no deformity Neurologic:  MAE spontaneously. No gross focal neurologic deficits are appreciated.  Skin:  Skin is warm, dry and intact. No rash noted. Psychiatric: Calm and cooperative.    ED Results / Procedures / Treatments   Labs (all labs ordered are listed, but only abnormal results are displayed) Labs Reviewed  CBC WITH DIFFERENTIAL/PLATELET - Abnormal; Notable for the following components:      Result Value   RBC 4.17 (*)    Hemoglobin 12.2 (*)     RDW 17.4 (*)    All other components within normal limits  BASIC METABOLIC PANEL - Abnormal; Notable for the following components:   Glucose, Bld 111 (*)    BUN 66 (*)    Creatinine, Ser 3.92 (*)    GFR, Estimated 15 (*)    All other components within normal limits     EKG  ED ECG REPORT I, Merlyn Lot, the attending physician, personally viewed and interpreted this ECG.   Date: 02/23/2023  EKG Time: 22:45  Rate: 75  Rhythm: sinus  Axis: normal  Intervals:normal  ST&T Change: no stemi    RADIOLOGY    PROCEDURES:  Critical Care performed: No  Procedures   MEDICATIONS ORDERED IN ED: Medications - No data to display   IMPRESSION / MDM / [place] / ED COURSE  I reviewed the triage vital signs and the nursing notes.                              Differential diagnosis includes, but is not limited to, Psychosis, delirium, medication effect, noncompliance, polysubstance abuse,  Si, Hi, depression   Patient here under IVC due to refusing dialysis.  Reportedly compliant with his medications.  Reportedly had some increased odd behavior.  Patient presenting to the ER for evaluation of symptoms as described above.  Based on symptoms, risk factors and considered above differential, this presenting complaint could reflect a potentially life-threatening illness therefore laboratory evaluation will be sent to evaluate for the above complaints.  He does not appear volume overloaded.  Blood work does appear within normal ranges for dialysis patient no indication for emergent dialysis.  Have notified nephrology as patient will be here requiring routine dialysis.  Have consulted psychiatry.  He is cleared for psychiatric evaluation.  The patient has been placed in psychiatric observation due to the need to provide a safe environment for the patient while obtaining psychiatric consultation and evaluation, as well as ongoing medical and medication management to treat  the patient's condition.  The patient has been placed under full IVC at this time.      FINAL CLINICAL IMPRESSION(S) / ED DIAGNOSES   Final diagnoses:  Psychosis, unspecified psychosis type ([place])  ESRD (end stage renal disease) on dialysis [facility])     Rx / DC Orders   ED Discharge Orders     None        Note:  This document was prepared using Dragon voice recognition software and may include unintentional dictation errors.    Merlyn Lot, MD 02/23/23 2352

## 2023-02-23 NOTE — ED Notes (Signed)
Orange shoes Red sweatshirt Tan sweatshirt Black socks Black and yellow sweatpants White tee shirt Pearline Cables sweatpants [employer] Black tank top [employer]

## 2023-02-24 ENCOUNTER — Inpatient Hospital Stay: Payer: Medicare Other

## 2023-02-24 ENCOUNTER — Emergency Department: Payer: Medicare Other

## 2023-02-24 DIAGNOSIS — F17210 Nicotine dependence, cigarettes, uncomplicated: Secondary | ICD-10-CM | POA: Diagnosis not present

## 2023-02-24 DIAGNOSIS — I10 Essential (primary) hypertension: Secondary | ICD-10-CM

## 2023-02-24 DIAGNOSIS — E8770 Fluid overload, unspecified: Secondary | ICD-10-CM | POA: Diagnosis not present

## 2023-02-24 DIAGNOSIS — F4325 Adjustment disorder with mixed disturbance of emotions and conduct: Secondary | ICD-10-CM

## 2023-02-24 DIAGNOSIS — Z992 Dependence on renal dialysis: Secondary | ICD-10-CM

## 2023-02-24 DIAGNOSIS — R2681 Unsteadiness on feet: Secondary | ICD-10-CM | POA: Diagnosis not present

## 2023-02-24 DIAGNOSIS — Z79899 Other long term (current) drug therapy: Secondary | ICD-10-CM | POA: Diagnosis not present

## 2023-02-24 DIAGNOSIS — F2089 Other schizophrenia: Secondary | ICD-10-CM

## 2023-02-24 DIAGNOSIS — E1122 Type 2 diabetes mellitus with diabetic chronic kidney disease: Secondary | ICD-10-CM

## 2023-02-24 DIAGNOSIS — H9190 Unspecified hearing loss, unspecified ear: Secondary | ICD-10-CM | POA: Diagnosis not present

## 2023-02-24 DIAGNOSIS — I120 Hypertensive chronic kidney disease with stage 5 chronic kidney disease or end stage renal disease: Secondary | ICD-10-CM | POA: Diagnosis not present

## 2023-02-24 DIAGNOSIS — F209 Schizophrenia, unspecified: Secondary | ICD-10-CM | POA: Diagnosis not present

## 2023-02-24 DIAGNOSIS — E890 Postprocedural hypothyroidism: Secondary | ICD-10-CM | POA: Diagnosis not present

## 2023-02-24 DIAGNOSIS — N186 End stage renal disease: Secondary | ICD-10-CM

## 2023-02-24 DIAGNOSIS — D631 Anemia in chronic kidney disease: Secondary | ICD-10-CM | POA: Diagnosis not present

## 2023-02-24 DIAGNOSIS — N2581 Secondary hyperparathyroidism of renal origin: Secondary | ICD-10-CM | POA: Diagnosis not present

## 2023-02-24 DIAGNOSIS — Z91158 Patient's noncompliance with renal dialysis for other reason: Secondary | ICD-10-CM | POA: Diagnosis not present

## 2023-02-24 DIAGNOSIS — Z794 Long term (current) use of insulin: Secondary | ICD-10-CM

## 2023-02-24 DIAGNOSIS — Z7984 Long term (current) use of oral hypoglycemic drugs: Secondary | ICD-10-CM | POA: Diagnosis not present

## 2023-02-24 DIAGNOSIS — F0390 Unspecified dementia without behavioral disturbance: Secondary | ICD-10-CM | POA: Diagnosis not present

## 2023-02-24 DIAGNOSIS — M6281 Muscle weakness (generalized): Secondary | ICD-10-CM | POA: Diagnosis not present

## 2023-02-24 DIAGNOSIS — D351 Benign neoplasm of parathyroid gland: Secondary | ICD-10-CM | POA: Diagnosis not present

## 2023-02-24 DIAGNOSIS — N400 Enlarged prostate without lower urinary tract symptoms: Secondary | ICD-10-CM | POA: Diagnosis not present

## 2023-02-24 LAB — DIFFERENTIAL
Abs Immature Granulocytes: 0.01 10*3/uL (ref 0.00–0.07)
Basophils Absolute: 0 10*3/uL (ref 0.0–0.1)
Basophils Relative: 0 %
Eosinophils Absolute: 0.1 10*3/uL (ref 0.0–0.5)
Eosinophils Relative: 2 %
Immature Granulocytes: 0 %
Lymphocytes Relative: 33 %
Lymphs Abs: 1.6 10*3/uL (ref 0.7–4.0)
Monocytes Absolute: 0.5 10*3/uL (ref 0.1–1.0)
Monocytes Relative: 9 %
Neutro Abs: 2.7 10*3/uL (ref 1.7–7.7)
Neutrophils Relative %: 56 %

## 2023-02-24 LAB — IRON AND TIBC
Iron: 56 ug/dL (ref 45–182)
Saturation Ratios: 25 % (ref 17.9–39.5)
TIBC: 228 ug/dL — ABNORMAL LOW (ref 250–450)
UIBC: 172 ug/dL

## 2023-02-24 LAB — CBC
HCT: 39.7 % (ref 39.0–52.0)
Hemoglobin: 12.5 g/dL — ABNORMAL LOW (ref 13.0–17.0)
MCH: 28.7 pg (ref 26.0–34.0)
MCHC: 31.5 g/dL (ref 30.0–36.0)
MCV: 91.1 fL (ref 80.0–100.0)
Platelets: 177 10*3/uL (ref 150–400)
RBC: 4.36 MIL/uL (ref 4.22–5.81)
RDW: 16.9 % — ABNORMAL HIGH (ref 11.5–15.5)
WBC: 4.9 10*3/uL (ref 4.0–10.5)
nRBC: 0 % (ref 0.0–0.2)

## 2023-02-24 LAB — GLUCOSE, CAPILLARY
Glucose-Capillary: 195 mg/dL — ABNORMAL HIGH (ref 70–99)
Glucose-Capillary: 103 mg/dL — ABNORMAL HIGH (ref 70–99)

## 2023-02-24 LAB — RENAL FUNCTION PANEL
Albumin: 3.4 g/dL — ABNORMAL LOW (ref 3.5–5.0)
Anion gap: 7 (ref 5–15)
BUN: 51 mg/dL — ABNORMAL HIGH (ref 8–23)
CO2: 26 mmol/L (ref 22–32)
Calcium: 8.9 mg/dL (ref 8.9–10.3)
Chloride: 107 mmol/L (ref 98–111)
Creatinine, Ser: 2.84 mg/dL — ABNORMAL HIGH (ref 0.61–1.24)
GFR, Estimated: 22 mL/min — ABNORMAL LOW (ref >60)
Glucose, Bld: 122 mg/dL — ABNORMAL HIGH (ref 70–99)
Phosphorus: 2.2 mg/dL — ABNORMAL LOW (ref 2.5–4.6)
Potassium: 4.2 mmol/L (ref 3.5–5.1)
Sodium: 140 mmol/L (ref 135–145)

## 2023-02-24 LAB — FERRITIN: Ferritin: 224 ng/mL (ref 24–336)

## 2023-02-24 LAB — HEPATITIS B SURFACE ANTIGEN: Hepatitis B Surface Ag: NONREACTIVE

## 2023-02-24 MED ORDER — ACETAMINOPHEN 325 MG PO TABS: 650.0000 mg | ORAL_TABLET | Freq: Four times a day (QID) | ORAL | Status: DC | PRN

## 2023-02-24 MED ORDER — ONDANSETRON HCL 4 MG/2ML IJ SOLN: 4.0000 mg | Freq: Four times a day (QID) | INTRAMUSCULAR | Status: DC | PRN

## 2023-02-24 MED ORDER — THIAMINE MONONITRATE 100 MG PO TABS
100.0000 mg | ORAL_TABLET | Freq: Every day | ORAL | Status: DC
  Administered 2023-02-24 – 2023-03-02 (×7): 100 mg via ORAL
  Filled 2023-02-24 (×7): qty 1

## 2023-02-24 MED ORDER — ADULT MULTIVITAMIN W/MINERALS CH
1.0000 | ORAL_TABLET | Freq: Every day | ORAL | Status: DC
  Administered 2023-02-24 – 2023-03-02 (×7): 1 via ORAL
  Filled 2023-02-24 (×7): qty 1

## 2023-02-24 MED ORDER — MELATONIN 5 MG PO TABS
2.5000 mg | ORAL_TABLET | Freq: Every day | ORAL | Status: DC
  Administered 2023-02-24 – 2023-03-01 (×6): 2.5 mg via ORAL
  Filled 2023-02-24 (×6): qty 1

## 2023-02-24 MED ORDER — ACETAMINOPHEN 650 MG RE SUPP: 650.0000 mg | Freq: Four times a day (QID) | RECTAL | Status: DC | PRN

## 2023-02-24 MED ORDER — FLUTICASONE PROPIONATE 50 MCG/ACT NA SUSP: 1.0000 | Freq: Every day | NASAL | Status: DC | PRN

## 2023-02-24 MED ORDER — DOCUSATE SODIUM 100 MG PO CAPS: 100.0000 mg | ORAL_CAPSULE | Freq: Two times a day (BID) | ORAL | Status: DC | PRN

## 2023-02-24 MED ORDER — FINASTERIDE 5 MG PO TABS
5.0000 mg | ORAL_TABLET | Freq: Every day | ORAL | Status: DC
  Administered 2023-02-24 – 2023-03-02 (×7): 5 mg via ORAL
  Filled 2023-02-24 (×7): qty 1

## 2023-02-24 MED ORDER — LOSARTAN POTASSIUM 50 MG PO TABS
100.0000 mg | ORAL_TABLET | Freq: Every day | ORAL | Status: DC
  Administered 2023-02-25 – 2023-03-02 (×6): 100 mg via ORAL
  Filled 2023-02-24 (×7): qty 2

## 2023-02-24 MED ORDER — INSULIN ASPART 100 UNIT/ML IJ SOLN
0.0000 [IU] | Freq: Three times a day (TID) | INTRAMUSCULAR | Status: DC
  Administered 2023-02-24: 1 [IU] via SUBCUTANEOUS
  Filled 2023-02-24: qty 1

## 2023-02-24 MED ORDER — ATORVASTATIN CALCIUM 20 MG PO TABS
20.0000 mg | ORAL_TABLET | Freq: Every day | ORAL | Status: DC
  Administered 2023-02-24 – 2023-03-02 (×7): 20 mg via ORAL
  Filled 2023-02-24 (×7): qty 1

## 2023-02-24 MED ORDER — CALCITRIOL 0.25 MCG PO CAPS
0.5000 ug | ORAL_CAPSULE | Freq: Every day | ORAL | Status: DC
  Administered 2023-02-24: 0.5 ug via ORAL
  Filled 2023-02-24: qty 2

## 2023-02-24 MED ORDER — CHLORHEXIDINE GLUCONATE CLOTH 2 % EX PADS
6.0000 | MEDICATED_PAD | Freq: Every day | CUTANEOUS | Status: DC
  Administered 2023-02-24 – 2023-03-02 (×7): 6 via TOPICAL
  Filled 2023-02-24: qty 6

## 2023-02-24 MED ORDER — OLANZAPINE 7.5 MG PO TABS
15.0000 mg | ORAL_TABLET | Freq: Every day | ORAL | Status: DC
  Administered 2023-02-24 – 2023-03-01 (×6): 15 mg via ORAL
  Filled 2023-02-24 (×6): qty 2

## 2023-02-24 MED ORDER — ONDANSETRON HCL 4 MG PO TABS: 4.0000 mg | ORAL_TABLET | Freq: Four times a day (QID) | ORAL | Status: DC | PRN

## 2023-02-24 MED ORDER — POLYETHYLENE GLYCOL 3350 17 G PO PACK: 17.0000 g | PACK | Freq: Every day | ORAL | Status: DC | PRN

## 2023-02-24 MED ORDER — HEPARIN SODIUM (PORCINE) 5000 UNIT/ML IJ SOLN
5000.0000 [IU] | Freq: Three times a day (TID) | INTRAMUSCULAR | Status: DC
  Administered 2023-02-24 – 2023-03-02 (×18): 5000 [IU] via SUBCUTANEOUS
  Filled 2023-02-24 (×19): qty 1

## 2023-02-24 MED ORDER — SODIUM CHLORIDE 0.9% FLUSH
3.0000 mL | Freq: Two times a day (BID) | INTRAVENOUS | Status: DC
  Administered 2023-02-25 – 2023-02-26 (×2): 3 mL via INTRAVENOUS

## 2023-02-24 MED ORDER — DOXAZOSIN MESYLATE 2 MG PO TABS
2.0000 mg | ORAL_TABLET | Freq: Every day | ORAL | Status: DC
  Administered 2023-02-24 – 2023-03-01 (×6): 2 mg via ORAL
  Filled 2023-02-24 (×6): qty 1

## 2023-02-24 MED ORDER — ERGOCALCIFEROL 1.25 MG (50000 UT) PO CAPS: 50000.0000 [IU] | ORAL_CAPSULE | ORAL | Status: DC

## 2023-02-24 MED ORDER — AMLODIPINE BESYLATE 5 MG PO TABS
5.0000 mg | ORAL_TABLET | Freq: Every day | ORAL | Status: DC
  Administered 2023-02-24 – 2023-03-02 (×7): 5 mg via ORAL
  Filled 2023-02-24 (×7): qty 1

## 2023-02-24 MED ORDER — FOLIC ACID 1 MG PO TABS
1.0000 mg | ORAL_TABLET | Freq: Every day | ORAL | Status: DC
  Administered 2023-02-24 – 2023-03-02 (×7): 1 mg via ORAL
  Filled 2023-02-24 (×7): qty 1

## 2023-02-24 MED ORDER — GABAPENTIN 300 MG PO CAPS
300.0000 mg | ORAL_CAPSULE | Freq: Every day | ORAL | Status: DC
  Administered 2023-02-25 – 2023-03-02 (×6): 300 mg via ORAL
  Filled 2023-02-24 (×6): qty 1

## 2023-02-24 NOTE — Assessment & Plan Note (Signed)
Patient is presenting after 1 month of refusing dialysis at his group home.  Psychiatry has evaluated and do not feel he meets criteria for IVC or inpatient psychiatric hospitalization.  They have diagnosed him with adjustment disorder.  Patient would greatly benefit from a goals of care conversation with the legal guardian.  Per chart review, patient has received been receiving dialysis for less than 1 year and it is difficult to discern if this was within his healthcare goals.  - Palliative consultation placed

## 2023-02-24 NOTE — BH Assessment (Signed)
Comprehensive Clinical Assessment (CCA) Screening, Triage and Referral Note  02/24/2023 ERVEY SCHUR [id number]  Chief Complaint:  Chief Complaint  Patient presents with   Psychiatric Evaluation   Visit Diagnosis: Medical Concern   [name]. Begnaud is 77 year old male who presents to the ER because he stopped going to Dialysis. He stopped approximately three weeks ago and he goes three times a week. Per the group home, he takes his medications and have no concern for his mental health, just his medical concerns because of his refusal to go to dialysis. He started going in November 2023 and recently started refusing to get in the care home [facility] Le[facility] to go. The patient has been at the care home for approximately six years  Per his guardian Elmyra Ricks [phone]), the dialysis center felt the client may need a higher level of care group home, to address his medical needs. She states she has been looking but the patient has been with them for six years and he consider them his home. When it was mentioned he may be moving, the patient started to shut down and seem sad. She would prefer for him to stay there but want to make sure his needs are met, and looking to move him if she need to.  Patient Reported Information How did you hear about [place]? No data recorded What Is the Reason for Your Visit/Call Today? Stop going to his dialysis for last three weeks.  How Long Has This Been Causing You Problems? 1 wk - 1 month  What Do You Feel Would Help You the Most Today? Medication(s)   Have You Recently Had Any Thoughts About Hurting Yourself? No  Are You Planning to Commit Suicide/Harm Yourself At This time? No   Have you Recently Had Thoughts About [place]? No  Are You Planning to Harm Someone at This Time? No  Explanation: No data recorded  Have You Used Any Alcohol or Drugs in the Past 24 Hours? No  How Long Ago Did You Use Drugs or Alcohol? No data recorded What Did You Use  and How Much? No data recorded  Do You Currently Have a Therapist/Psychiatrist? No  Name of Therapist/Psychiatrist: No data recorded  Have You Been Recently Discharged From Any Office Practice or Programs? No  Explanation of Discharge From Practice/Program: No data recorded   CCA Screening Triage Referral Assessment Type of Contact: Face-to-Face  Telemedicine Service Delivery:   Is this Initial or Reassessment?   Date Telepsych consult ordered in [facility]:    Time Telepsych consult ordered in [facility]:    Location of Assessment: [facility] ED  Provider Location: [facility] ED    Collateral Involvement: No data recorded  Does Patient Have a [place]? No data recorded Name and Contact of Legal Guardian: No data recorded If Minor and Not Living with Parent(s), Who has Custody? No data recorded Is CPS involved or ever been involved? Never  Is APS involved or ever been involved? Currently   Patient Determined To Be At Risk for Harm To Self or Others Based on Review of Patient Reported Information or Presenting Complaint? No data recorded Method: No data recorded Availability of Means: No data recorded Intent: No data recorded Notification Required: No data recorded Additional Information for Danger to Others Potential: No data recorded Additional Comments for Danger to Others Potential: No data recorded Are There Guns or Other Weapons in Your Home? No  Types of Guns/Weapons: No data recorded Are These Weapons Safely Secured?  No  Who Could Verify You Are Able To Have These Secured: No data recorded Do You Have any Outstanding Charges, Pending Court Dates, Parole/Probation? No data recorded Contacted To Inform of Risk of Harm To Self or Others: No data recorded  Does Patient Present under Involuntary Commitment? Yes   [place] of Residence: [place]   Patient Currently Receiving the Following Services: No data recorded  Determination of Need:  Emergent (2 hours)   Options For Referral: ED Visit   Discharge Disposition:    Gunnar Fusi MS, LCAS, [facility], [facility] Therapeutic Triage Specialist 02/24/2023 11:10 AM

## 2023-02-24 NOTE — Discharge Planning (Addendum)
ESTABLISHED HEMODIALYSIS  Outpatient Facility: [facility] [address] [place], [place] [address] [phone]  Scheduled Days:  Monday, Wednesday and Friday Chair Time: 11:15am  Spoke with Ms. Weeks, patient's guardian, she is concerned that patient now requires a high level of care that the current group home cannot provide. Informed Dr. Juleen China and Dr. Jari Pigg of this concern and asked if it would be acceptable to have a PT/OT evaluation. Ms. Suella Grove also mentioned home dialysis. This was discussed and because patient resides at a group home, some one there would need to be trained to provide, as well they would need to have enough space for supplies. It was also discussed that LTC and SNF do not provide home dialysis. Ms.Weeks understands and agrees this may not be an option.   **Patient has been discharged from clinic due to missing 30 days. Reinitiation is required for clinic to accept back.

## 2023-02-24 NOTE — Progress Notes (Signed)
Hemodialysis Note  Received patient in bed to unit. Alert and oriented. Informed consent signed and in chart.   Treatment initiated:1202 Treatment completed:1509  Patient tolerated treatment well. Transported back to the room alert, without acute distress. Report given to patient's RN.  Access used:RIJ  Catheter  Access issues: None   Total UF removed:500 ml Medications given:None  Post HD KM:[id number]  Post HD weight:70.7 kg   Forrest Moron, RN [facility]

## 2023-02-24 NOTE — BH Assessment (Signed)
Writer called and left a HIPPA Compliant message with Care Home (T. Rogers-[phone]), requesting a return phone call.

## 2023-02-24 NOTE — ED Provider Notes (Signed)
Pt seen by nephrology will get patient dialysis today and try to get him back on Monday Wednesday Friday dialysis session.  Patient is agreeable to dialysis this afternoon.   Vanessa [place], MD 02/24/23 1145

## 2023-02-24 NOTE — ED Provider Notes (Signed)
Emergency Medicine Observation Re-evaluation Note  [name] is a 77 y.o. male, seen on rounds today.  Pt initially presented to the ED for complaints of Psychiatric Evaluation  Currently, the patient is is no acute distress. Denies any concerns at this time.  Physical Exam  Blood pressure (!) 170/85, pulse 88, temperature 98.3 F (36.8 C), temperature source Oral, resp. rate 18, height 5\' 9"  (1.753 m), weight 73 kg, SpO2 100 %.  Physical Exam: General: No apparent distress Pulm: Normal WOB Neuro: Moving all extremities Psych: Resting comfortably     ED Course / MDM   Clinical Course as of 02/24/23 0512  Thu Feb 23, 2023  2319 ESRD on HD, schizophrenia -  IVC and plan for psych admit.  [SM]    Clinical Course User Index [SM] Nathaniel Man, MD    I have reviewed the labs performed to date as well as medications administered while in observation.  Recent changes in the last 24 hours include: No acute events overnight.  Plan   Current plan: Patient awaiting psychiatric disposition. Patient is under full IVC at this time.    Nathaniel Man, MD 02/24/23 [phone]

## 2023-02-24 NOTE — Assessment & Plan Note (Addendum)
-   SSI, very sensitive scale - Hold home antiglycemic agents - Continue home gabapentin

## 2023-02-24 NOTE — ED Notes (Signed)
Hospital meal provided, pt tolerated w/o complaints.  Waste discarded appropriately.  

## 2023-02-24 NOTE — Progress Notes (Signed)
[facility]  ROUNDING NOTE   Subjective:   [name] was admitted to [facility] on 02/23/2023 for IVC  Last hemodialysis treatment was 01/27/23.   Patient lives in a group home and has a legal guardian. Patient has been refusing to go to outpatient hemodialysis treatments. Patient unable to give an answer for why.   Patient had involuntary commitment as he has become a danger to himself for not getting his scheduled dialysis treatments.   Patient brought to the ED and has underwent psychiatric evaluation.   Patient with shortness of breath.    Objective:  Vital signs in last 24 hours:  Temp:  [98.3 F (36.8 C)-98.4 F (36.9 C)] 98.3 F (36.8 C) (03/29 1200) Pulse Rate:  [74-88] 74 (03/29 1203) Resp:  [16-20] 20 (03/29 1203) BP: (148-170)/(78-85) 148/78 (03/29 1203) SpO2:  [100 %] 100 % (03/29 1203) Weight:  [71.2 kg-73 kg] 71.2 kg (03/29 1159)  Weight change:  Filed Weights   02/23/23 2109 02/24/23 1159  Weight: 73 kg 71.2 kg    Intake/Output: No intake/output data recorded.   Intake/Output this shift:  No intake/output data recorded.  Physical Exam: General: Ill appearing  Head: Normocephalic, atraumatic. Moist oral mucosal membranes  Eyes: Anicteric, PERRL  Neck: Supple, trachea midline  Lungs:  Bilateral crackles  Heart: Regular rate and rhythm  Abdomen:  Soft, nontender,   Extremities:  + peripheral edema.  Neurologic: Nonfocal, moving all four extremities  Skin: No lesions  Access: Right IJ permcath with dirty dressings    Basic Metabolic Panel: Recent Labs  Lab 02/23/23 2120  NA 141  K 5.0  CL 110  CO2 23  GLUCOSE 111*  BUN 66*  CREATININE 3.92*  CALCIUM 9.5    Liver Function Tests: No results for input(s): "AST", "ALT", "ALKPHOS", "BILITOT", "PROT", "ALBUMIN" in the last 168 hours. No results for input(s): "LIPASE", "AMYLASE" in the last 168 hours. No results for input(s): "AMMONIA" in the last 168 hours.  CBC: Recent  Labs  Lab 02/23/23 2120  WBC 6.4  NEUTROABS 3.8  HGB 12.2*  HCT 39.4  MCV 94.5  PLT 195    Cardiac Enzymes: No results for input(s): "CKTOTAL", "CKMB", "CKMBINDEX", "TROPONINI" in the last 168 hours.  BNP: Invalid input(s): "POCBNP"  CBG: No results for input(s): "GLUCAP" in the last 168 hours.  Microbiology: Results for orders placed or performed during the hospital encounter of 09/30/22  Resp Panel by RT-PCR (Flu A&B, Covid)     Status: None   Collection Time: 09/30/22 12:06 PM   Specimen: Nasal Swab  Result Value Ref Range Status   SARS Coronavirus 2 by RT PCR NEGATIVE NEGATIVE Final    Comment: (NOTE) SARS-CoV-2 target nucleic acids are NOT DETECTED.  The SARS-CoV-2 RNA is generally detectable in upper respiratory specimens during the acute phase of infection. The lowest concentration of SARS-CoV-2 viral copies this assay can detect is 138 copies/mL. A negative result does not preclude SARS-Cov-2 infection and should not be used as the sole basis for treatment or other patient management decisions. A negative result may occur with  improper specimen collection/handling, submission of specimen other than nasopharyngeal swab, presence of viral mutation(s) within the areas targeted by this assay, and inadequate number of viral copies(<138 copies/mL). A negative result must be combined with clinical observations, patient history, and epidemiological information. The expected result is Negative.  Fact Sheet for Patients:  EntrepreneurPulse.com.au  Fact Sheet for Healthcare Providers:  IncredibleEmployment.be  This test is  no t yet approved or cleared by the [place] and  has been authorized for detection and/or diagnosis of SARS-CoV-2 by FDA under an Emergency Use Authorization (EUA). This EUA will remain  in effect (meaning this test can be used) for the duration of the COVID-19 declaration under Section 564(b)(1) of the  Act, 21 U.S.C.section 360bbb-3(b)(1), unless the authorization is terminated  or revoked sooner.       Influenza A by PCR NEGATIVE NEGATIVE Final   Influenza B by PCR NEGATIVE NEGATIVE Final    Comment: (NOTE) The Xpert Xpress SARS-CoV-2/FLU/RSV plus assay is intended as an aid in the diagnosis of influenza from Nasopharyngeal swab specimens and should not be used as a sole basis for treatment. Nasal washings and aspirates are unacceptable for Xpert Xpress SARS-CoV-2/FLU/RSV testing.  Fact Sheet for Patients: EntrepreneurPulse.com.au  Fact Sheet for Healthcare Providers: IncredibleEmployment.be  This test is not yet approved or cleared by the [place] FDA and has been authorized for detection and/or diagnosis of SARS-CoV-2 by FDA under an Emergency Use Authorization (EUA). This EUA will remain in effect (meaning this test can be used) for the duration of the COVID-19 declaration under Section 564(b)(1) of the Act, 21 U.S.C. section 360bbb-3(b)(1), unless the authorization is terminated or revoked.  Performed at [facility], [address] [place], [place] [address]   Culture, blood (Routine X 2) w Reflex to ID Panel     Status: None   Collection Time: 10/01/22 11:39 AM   Specimen: BLOOD  Result Value Ref Range Status   Specimen Description BLOOD BLOOD RIGHT WRIST  Final   Special Requests   Final    BOTTLES DRAWN AEROBIC AND ANAEROBIC Blood Culture results may not be optimal due to an inadequate volume of blood received in culture bottles   Culture   Final    NO GROWTH 5 DAYS Performed at [facility], [address] [place], [place] [address]    Report Status 10/06/2022 FINAL  Final  Culture, blood (Routine X 2) w Reflex to ID Panel     Status: None   Collection Time: 10/01/22 11:39 AM   Specimen: BLOOD RIGHT HAND  Result Value Ref Range Status   Specimen Description BLOOD RIGHT HAND  Final   Special  Requests   Final    BOTTLES DRAWN AEROBIC AND ANAEROBIC Blood Culture results may not be optimal due to an inadequate volume of blood received in culture bottles   Culture   Final    NO GROWTH 5 DAYS Performed at [facility], [address] [place], [place] [address]    Report Status 10/06/2022 FINAL  Final    Coagulation Studies: No results for input(s): "LABPROT", "INR" in the last 72 hours.  Urinalysis: No results for input(s): "COLORURINE", "LABSPEC", "PHURINE", "GLUCOSEU", "HGBUR", "BILIRUBINUR", "KETONESUR", "PROTEINUR", "UROBILINOGEN", "NITRITE", "LEUKOCYTESUR" in the last 72 hours.  Invalid input(s): "APPERANCEUR"    Imaging: No results found.   Medications:     atorvastatin  20 mg Oral Daily   calcitRIOL  0.5 mcg Oral Daily   Chlorhexidine Gluconate Cloth  6 each Topical Q0600   finasteride  5 mg Oral Daily   folic acid  1 mg Oral Daily   melatonin  2.5 mg Oral QHS   multivitamin with minerals  1 tablet Oral Daily   thiamine  100 mg Oral Daily   docusate sodium, fluticasone  Assessment/ Plan:  [name] is a 77 y.o.  male with  end stage renal disease on hemodialysis, hypertension, schizophrenia, diabetes mellitus type II, parathyroid adenoma who is admitted to [facility] on 02/23/2023 for IVC  [facility] Right IJ permcath 74kg.   End Stage Renal Disease: Last hemodialysis treatment was 01/27/23. Now with fluid overload and concerns for uremic encephalopathy. Will reinitiate dialysis over 3 days. Working with ED on admission. Emergent hemodialysis treatment today. Seen and examined on hemodialysis treatment.   Hypertension with chronic kidney disease: 150/79. Unclear about home medication compliance. Home regimen of clonidine, doxazosin, losartan, and amlodipine. Not on a diuretic at home.  Anemia of chronic kidney disease: hemoglobin 12.2. No indication for ESA.   Secondary Hyperparathyroidism: with low PTH of 73 on 01/09/23.  Calcium at goal. Patient was previously on calcitriol, calcium carbonate, and sevelamer at home. Will hold these agents until new PTH and phosphorus available.   Diabetes mellitus type II with chronic kidney disease: insulin dependent. Hemoglobin A1c of 6.3% on 12/14/2022.   Schizophrenia: appreciate psych input. Patient with altered mental status that could be psychosis versus uremia. However possibly a component of both.    LOS: 0 Merdith Boyd 3/29/202412:31 PM

## 2023-02-24 NOTE — Assessment & Plan Note (Signed)
-   Continue home Zyprexa

## 2023-02-24 NOTE — H&P (Signed)
History and Physical    Patient: [name] [id number] DOB: 20-Apr-1946 DOA: 02/23/2023 DOS: the patient was seen and examined on 02/24/2023 PCP: [facility]  Patient coming from: ALF/ILF  Chief Complaint:  Chief Complaint  Patient presents with   Psychiatric Evaluation   HPI: [name] is a 77 y.o. male with medical history significant of ESRD on HD, uncontrolled type 2 diabetes, hypertension, alcohol use disorder, dementia, schizophrenia, who presents to the ED for psychiatric evaluation.  History obtained through chart review due to patient's altered mental status at baseline.  [name] states that he is not experiencing any pain or shortness of breath at this time.  He is uncertain where he is or why he is here.  Per chart review, patient has a history of schizophrenia and dementia has a legal guardian.  He has been refusing dialysis for at least 1 month now.  He was brought in as an involuntary commitment from his group home.  ED course: Per chart review, patient was presenting as an involuntary commitment from his group home secondary to dialysis refusal.  On arrival, he was hypertensive at 150/79 with heart rate of 74.  He was saturating at 100% on room air.  He was afebrile at 98.4. Initial workup notable for WBC of 6.4, hemoglobin of 12.2, potassium of 5.0, bicarb 23, BUN 66, creatinine 3.92 and GFR 15.  Nephrology was consulted with dialysis emergently initiated.  Psychiatry was consulted; no evidence for psychiatric inpatient admission.  TRH contacted for admission.  Review of Systems: As mentioned in the history of present illness. All other systems reviewed and are negative.  Past Medical History:  Diagnosis Date   Alcoholism ([place])    Allergy    Altered mental status 2011   hx of 2/2 hypercalcemia, EtOH w/d and medication overuse   Angina    mild pain on arm, left chest, moves down left chest   BPH (benign prostatic hyperplasia)     Chronic kidney disease, stage 4 (severe) (HCC)    Chronic renal insufficiency    with history of acute on chronic secondary to dehydration and hypercalcemia   Dementia (HCC)    Encephalopathy    Fx humeral neck 2008   right side, no history of surgical repair   History of nephrolithiasis 2008   s/p ureterolithotomy    Hypercalcemia    2/2 primary hyperparathyroidism   Hyperkalemia    Hypertension    Pancreatitis 2011   history of several admissions for pancreatitis    Parathyroid adenoma    Primary hyperparathyroidism ([place])    s/p parathyroid adenoma resection Oct. 8, 2011, repeat scan showed residual right adenoma, however pt did not f/u with surgery as outpt   Schizophrenia ([place])    previously on zyprexa   Tobacco abuse    Type 2 diabetes mellitus with chronic kidney disease [facility])    Past Surgical History:  Procedure Laterality Date   A/V SHUNTOGRAM Left 01/12/2023   Procedure: A/V SHUNTOGRAM;  Surgeon: Algernon Huxley, MD;  Location: [place] [facility];  Service: Cardiovascular;  Laterality: Left;   Ankle Pinning     AV FISTULA PLACEMENT Left 08/17/2022   Procedure: INSERTION OF ARTERIOVENOUS (AV) GORE-TEX GRAFT ARM (BRACHIAL AXILLARY);  Surgeon: Algernon Huxley, MD;  Location: [facility] ORS;  Service: Vascular;  Laterality: Left;   cysto, removal of bladder stone, attempted stent placement  08/15/2007   cystoscopy, laser cystolithoplaxy  08/16/2007   DIALYSIS/PERMA CATHETER INSERTION N/A  07/04/2022   Procedure: DIALYSIS/PERMA CATHETER INSERTION;  Surgeon: Algernon Huxley, MD;  Location: [place] [facility];  Service: Cardiovascular;  Laterality: N/A;   KIDNEY STONE SURGERY  07/2007 X2   /E-chart   open left proximal ureterolithotomy  09/18/2007   parathyroid adenoma resection  09/04/2010   residual adenoma Oct. 12, 2011   PARATHYROIDECTOMY  03/09/2012   Procedure: PARATHYROIDECTOMY;  Surgeon: Pedro Earls, MD;  Location: [place];  Service: General;  Laterality: N/A;  mediastinal  paraqthyroidectomy    THYROIDECTOMY  03/09/2012   Social History:  reports that he has been smoking cigarettes. He has a 50.00 pack-year smoking history. He has never used smokeless tobacco. He reports that he does not drink alcohol and does not use drugs.  No Known Allergies  Family History  Problem Relation Age of Onset   Kidney cancer Neg Hx    Bladder Cancer Neg Hx    Prostate cancer Neg Hx     Prior to Admission medications   Medication Sig Start Date End Date Taking? Authorizing Provider  amLODipine (NORVASC) 5 MG tablet Take 5 mg by mouth daily.   Yes [provider]  atorvastatin (LIPITOR) 20 MG tablet Take 1 tablet (20 mg total) by mouth daily. 03/02/17  Yes Juline Patch, MD  calcitRIOL (ROCALTROL) 0.5 MCG capsule Take 1 capsule (0.5 mcg total) by mouth daily. 01/20/19  Yes Dustin Flock, MD  docusate sodium (COLACE) 100 MG capsule Take 1 capsule (100 mg total) by mouth 2 (two) times daily. Patient taking differently: Take 100 mg by mouth 2 (two) times daily as needed. 03/02/17  Yes Juline Patch, MD  doxazosin (CARDURA) 2 MG tablet Take 2 mg by mouth at bedtime.   Yes [provider]  finasteride (PROSCAR) 5 MG tablet TAKE ONE TABLET BY MOUTH EVERY DAY FOR ENLARGED PROSTATE Patient taking differently: Take 5 mg by mouth daily. TAKE ONE TABLET BY MOUTH EVERY DAY FOR ENLARGED PROSTATE 08/18/20  Yes McGowan, Larene Beach A, PA-C  fluticasone (FLONASE) 50 MCG/ACT nasal spray USE 2 SPRAYS IN EACH NOSTRIL DAILY AS NEEDED Patient taking differently: Place 1 spray into both nostrils daily. 03/02/17  Yes Juline Patch, MD  gabapentin (NEURONTIN) 300 MG capsule Take 300 mg by mouth daily. 01/23/23 01/23/24 Yes [provider]  Insulin Glargine (LANTUS SOLOSTAR) 100 UNIT/ML Solostar Pen Inject 10 Units into the skin Nightly.   Yes [provider]  losartan (COZAAR) 100 MG tablet Take 100 mg by mouth daily.   Yes [provider]  MELATONIN TR 1 MG  TBCR TAKE 1 TABLET BY MOUTH EVERY NIGHT AT BEDTIME 05/17/17  Yes Juline Patch, MD  OLANZapine (ZYPREXA) 15 MG tablet Take 15 mg by mouth at bedtime. 01/27/16  Yes [provider]  pioglitazone (ACTOS) 30 MG tablet Take 30 mg by mouth daily. 05/01/18  Yes [provider]  traZODone (DESYREL) 100 MG tablet Take 100 mg by mouth at bedtime.   Yes [provider]  cinacalcet (SENSIPAR) 30 MG tablet Take 30 mg by mouth daily. Patient not taking: Reported on 01/12/2023    [provider]  cloNIDine (CATAPRES) 0.1 MG tablet Take 0.1 mg by mouth 2 (two) times daily. Patient not taking: Reported on 02/24/2023 05/20/22   [provider]  denosumab (PROLIA) 60 MG/ML SOSY injection Inject 1 mL into the skin every 6 (six) months. 11/23/18   [provider]  ergocalciferol (VITAMIN D2) 50000 units capsule Take 1 capsule (50,000  Units total) by mouth every 6 (six) weeks. Patient not taking: Reported on 02/24/2023 03/02/17   Juline Patch, MD  folic acid (FOLVITE) 1 MG tablet Take 1 tablet (1 mg total) by mouth daily. Patient not taking: Reported on 02/24/2023 03/02/17   Juline Patch, MD  gabapentin (NEURONTIN) 400 MG capsule Take 400 mg by mouth 3 (three) times daily.    [provider]  glucose blood test strip 1 each by Other route 2 (two) times daily. Use as instructed 10/05/16   Juline Patch, MD  HYDROcodone-acetaminophen (NORCO/VICODIN) 5-325 MG tablet Take 2 tablets by mouth every 6 (six) hours as needed for moderate pain. Patient not taking: Reported on 01/12/2023 08/17/22 08/17/23  Algernon Huxley, MD  Multiple Vitamins-Minerals (MULTIVITAMIN WITH MINERALS) tablet TAKE 1 TABLET BY MOUTH DAILY 04/17/17   Juline Patch, MD  sevelamer (RENAGEL) 800 MG tablet Take 800 mg by mouth 3 (three) times daily with meals.    [provider]  sevelamer carbonate (RENVELA) 800 MG tablet Take 1,600 mg by mouth 3 (three) times daily. 01/20/23   [provider]  sulfamethoxazole-trimethoprim (BACTRIM DS) 800-160 MG tablet Take 1 tablet by mouth 2 (two) times daily.    [provider]  thiamine (VITAMIN B-1) 100 MG tablet Take 1 tablet (100 mg total) by mouth daily. 03/02/17   Juline Patch, MD  TRUEPLUS LANCETS 26G MISC 1 each by Does not apply route 2 (two) times daily. 10/05/16   Juline Patch, MD    Physical Exam: Vitals:   02/24/23 1315 02/24/23 1330 02/24/23 1400 02/24/23 1430  BP: 120/74 (!) 142/89 133/83 (!) 149/112  Pulse: 70 74  76  Resp: 15 20 (!) 23 20  Temp:      TempSrc:      SpO2: 98% 99% 99% 99%  Weight:      Height:       Physical Exam Vitals and nursing note reviewed.  Constitutional:      General: He is not in acute distress.    Appearance: He is normal weight.  HENT:     Head: Normocephalic and atraumatic.     Mouth/Throat:     Mouth: Mucous membranes are moist.     Pharynx: Oropharynx is clear.  Cardiovascular:     Rate and Rhythm: Normal rate and regular rhythm.     Heart sounds: No murmur heard.    No gallop.  Pulmonary:     Effort: Pulmonary effort is normal. No respiratory distress.     Breath sounds: Normal breath sounds. No wheezing, rhonchi or rales.  Abdominal:     General: Bowel sounds are normal. There is distension.     Palpations: Abdomen is soft.     Tenderness: There is no abdominal tenderness. There is no guarding.  Musculoskeletal:     Right lower leg: No edema.     Left lower leg: No edema.  Skin:    General: Skin is warm and dry.  Neurological:     Mental Status: He is alert.     Comments:  Patient is alert and oriented to person only.  Disoriented to time, place and situation Moving all extremities independently with no weakness noted    Data Reviewed: CBC with WBC of 6.4, hemoglobin 12.2, platelets 195 BMP with sodium of 141, potassium 5.0, bicarb 23, glucose 111, BUN 66, creatinine 3.92, GFR of 15.  Repeat BMP this a.m. with potassium of 4.2, bicarb 26,  BUN  51, creatinine 2.84 with GFR of 22.  EKG personally reviewed.  Sinus rhythm with rate of 76.  No acute ST or T wave changes concerning for acute ischemia.  There are no new results to review at this time.  Assessment and Plan:  * ESRD (end stage renal disease) ([place]) Patient presenting for emergent dialysis after refusing dialysis for the past 1 month.  On examination, patient is hypervolemic, however is saturating at 100% on room air with hemodynamic stability.  - Nephrology following; appreciate their recommendations - Hemodialysis scheduling per nephrology  Adjustment disorder with mixed disturbance of emotions and conduct Patient is presenting after 1 month of refusing dialysis at his group home.  Psychiatry has evaluated and do not feel he meets criteria for IVC or inpatient psychiatric hospitalization.  They have diagnosed him with adjustment disorder.  Patient would greatly benefit from a goals of care conversation with the legal guardian.  Per chart review, patient has received been receiving dialysis for less than 1 year and it is difficult to discern if this was within his healthcare goals.  - Palliative consultation placed  Type 2 diabetes mellitus (HCC) - SSI, very sensitive scale - Hold home antiglycemic agents - Continue home gabapentin  Hypertension - Restart home antihypertensives  Schizophrenia ([place]) - Continue home Zyprexa  Advance Care Planning:   Code Status: Full Code as advanced directive is unavailable, patient is unable to make medical decisions and unable to reach legal guardian by telephone.   Consults: Nephrology  Family Communication: Attempted to contact legal guardian, no answer.   Severity of Illness: The appropriate patient status for this patient is INPATIENT. Inpatient status is judged to be reasonable and necessary in order to provide the required intensity of service to ensure the patient's safety. The patient's presenting symptoms, physical  exam findings, and initial radiographic and laboratory data in the context of their chronic comorbidities is felt to place them at high risk for further clinical deterioration. Furthermore, it is not anticipated that the patient will be medically stable for discharge from the hospital within 2 midnights of admission.   * I certify that at the point of admission it is my clinical judgment that the patient will require inpatient hospital care spanning beyond 2 midnights from the point of admission due to high intensity of service, high risk for further deterioration and high frequency of surveillance required.*  Author: Jose Persia, MD 02/24/2023 2:59 PM  For on call review [email or web address].

## 2023-02-24 NOTE — ED Notes (Signed)
IVC/pending psych consult 

## 2023-02-24 NOTE — Assessment & Plan Note (Signed)
Patient presenting for emergent dialysis after refusing dialysis for the past 1 month.  On examination, patient is hypervolemic, however is saturating at 100% on room air with hemodynamic stability.  - Nephrology following; appreciate their recommendations - Hemodialysis scheduling per nephrology

## 2023-02-24 NOTE — ED Notes (Signed)
IVC papers  rescinded  per  Gust Rung  NP  informed  Claretta Fraise  RN

## 2023-02-24 NOTE — Consult Note (Signed)
[facility] Face-to-Face Psychiatry Consult   Reason for Consult:  Refused dialysis Referring Physician:  EDP Patient Identification: [name] MRN:  [id number] Principal Diagnosis: Adjustment disorder with mixed disturbance of emotions and conduct Diagnosis:  Principal Problem:   Adjustment disorder with mixed disturbance of emotions and conduct   Total Time spent with patient: 45 minutes  Subjective:   [name] is a 77 y.o. male patient admitted with refusing to go to dialysis, history of schizophrenia.  HPI:  77 yo male presented via IVC by his guardian as he was refusing dialysis.  She IVC'd him to get dialysis and then he can return to his group home.  He was confused on the assessment at times, very hard of hearing which skewed the interview.  Calm and cooperative with denial of suicidal/homicidal ideations, hallucinations, and substance abuse.  The plan is for him to received dialysis, release the IVC, and for him to return to his group home.  Caveat:  He has been at his group home for years without any issues until he started dialysis as he evidently does not desire to go.  Past Psychiatric History: schizophrenia  Risk to Self:  none Risk to Others:  none Prior Inpatient Therapy:  none Prior Outpatient Therapy:  in place  Past Medical History:  Past Medical History:  Diagnosis Date   Alcoholism ([place])    Allergy    Altered mental status 2011   hx of 2/2 hypercalcemia, EtOH w/d and medication overuse   Angina    mild pain on arm, left chest, moves down left chest   BPH (benign prostatic hyperplasia)    Chronic kidney disease, stage 4 (severe) (HCC)    Chronic renal insufficiency    with history of acute on chronic secondary to dehydration and hypercalcemia   Dementia ([place])    Encephalopathy    Fx humeral neck 2008   right side, no history of surgical repair   History of nephrolithiasis 2008   s/p ureterolithotomy    Hypercalcemia    2/2 primary  hyperparathyroidism   Hyperkalemia    Hypertension    Pancreatitis 2011   history of several admissions for pancreatitis    Parathyroid adenoma    Primary hyperparathyroidism ([place])    s/p parathyroid adenoma resection Oct. 8, 2011, repeat scan showed residual right adenoma, however pt did not f/u with surgery as outpt   Schizophrenia ([place])    previously on zyprexa   Tobacco abuse    Type 2 diabetes mellitus with chronic kidney disease [facility])     Past Surgical History:  Procedure Laterality Date   A/V SHUNTOGRAM Left 01/12/2023   Procedure: A/V SHUNTOGRAM;  Surgeon: Algernon Huxley, MD;  Location: [place] [facility];  Service: Cardiovascular;  Laterality: Left;   Ankle Pinning     AV FISTULA PLACEMENT Left 08/17/2022   Procedure: INSERTION OF ARTERIOVENOUS (AV) GORE-TEX GRAFT ARM (BRACHIAL AXILLARY);  Surgeon: Algernon Huxley, MD;  Location: [facility] OR[facility];  Service: Vascular;  Laterality: Left;   cysto, removal of bladder stone, attempted stent placement  08/15/2007   cystoscopy, laser cystolithoplaxy  08/16/2007   DIALYSIS/PERMA CATHETER INSERTION N/A 07/04/2022   Procedure: DIALYSIS/PERMA CATHETER INSERTION;  Surgeon: Algernon Huxley, MD;  Location: [place] [facility];  Service: Cardiovascular;  Laterality: N/A;   KIDNEY STONE SURGERY  07/2007 X2   /E-chart   open left proximal ureterolithotomy  09/18/2007   parathyroid adenoma resection  09/04/2010   residual adenoma Oct. 12, 2011  PARATHYROIDECTOMY  03/09/2012   Procedure: PARATHYROIDECTOMY;  Surgeon: Pedro Earls, MD;  Location: [place];  Service: General;  Laterality: N/A;  mediastinal paraqthyroidectomy    THYROIDECTOMY  03/09/2012   Family History:  Family History  Problem Relation Age of Onset   Kidney cancer Neg Hx    Bladder Cancer Neg Hx    Prostate cancer Neg Hx    Family Psychiatric  History: none Social History:  Social History   Substance and Sexual Activity  Alcohol Use No   Comment: 03/09/12 "last alcohol 1983";  history of heavy alcohol use     Social History   Substance and Sexual Activity  Drug Use No   Types: Marijuana   Comment: 03/09/12 Last drug use "~1978"    Social History   Socioeconomic History   Marital status: Single    Spouse name: Not on file   Number of children: Not on file   Years of education: Not on file   Highest education level: Not on file  Occupational History   Occupation: disabled  Tobacco Use   Smoking status: Every Day    Packs/day: 1.00    Years: 50.00    Additional pack years: 0.00    Total pack years: 50.00    Types: Cigarettes   Smokeless tobacco: Never  Substance and Sexual Activity   Alcohol use: No    Comment: 03/09/12 "last alcohol 1983"; history of heavy alcohol use   Drug use: No    Types: Marijuana    Comment: 03/09/12 Last drug use "~1978"   Sexual activity: Never  Other Topics Concern   Not on file  Social History Narrative   Patient is disabled. Patient lives with at group home [facility]) Patient has history of being in prison.History of alcoholism   Social Determinants of Radio broadcast assistant Strain: Not on file  Food Insecurity: Not on file  Transportation Needs: Not on file  Physical Activity: Not on file  Stress: Not on file  Social Connections: Not on file   Additional Social History:    Allergies:  No Known Allergies  Labs:  Results for orders placed or performed during the hospital encounter of 02/23/23 (from the past 48 hour(s))  CBC with Differential     Status: Abnormal   Collection Time: 02/23/23  9:20 PM  Result Value Ref Range   WBC 6.4 4.0 - 10.5 K/uL   RBC 4.17 (L) 4.22 - 5.81 MIL/uL   Hemoglobin 12.2 (L) 13.0 - 17.0 g/dL   HCT 39.4 39.0 - 52.0 %   MCV 94.5 80.0 - 100.0 fL   MCH 29.3 26.0 - 34.0 pg   MCHC 31.0 30.0 - 36.0 g/dL   RDW 17.4 (H) 11.5 - 15.5 %   Platelets 195 150 - 400 K/uL   nRBC 0.0 0.0 - 0.2 %   Neutrophils Relative % 61 %   Neutro Abs 3.8 1.7 - 7.7 K/uL   Lymphocytes  Relative 29 %   Lymphs Abs 1.9 0.7 - 4.0 K/uL   Monocytes Relative 9 %   Monocytes Absolute 0.6 0.1 - 1.0 K/uL   Eosinophils Relative 1 %   Eosinophils Absolute 0.1 0.0 - 0.5 K/uL   Basophils Relative 0 %   Basophils Absolute 0.0 0.0 - 0.1 K/uL   Immature Granulocytes 0 %   Abs Immature Granulocytes 0.01 0.00 - 0.07 K/uL    Comment: Performed at [facility], [address], [place], [place] [address]  Basic  metabolic panel     Status: Abnormal   Collection Time: 02/23/23  9:20 PM  Result Value Ref Range   Sodium 141 135 - 145 mmol/L   Potassium 5.0 3.5 - 5.1 mmol/L   Chloride 110 98 - 111 mmol/L   CO2 23 22 - 32 mmol/L   Glucose, Bld 111 (H) 70 - 99 mg/dL    Comment: Glucose reference range applies only to samples taken after fasting for at least 8 hours.   BUN 66 (H) 8 - 23 mg/dL   Creatinine, Ser 3.92 (H) 0.61 - 1.24 mg/dL   Calcium 9.5 8.9 - 10.3 mg/dL   GFR, Estimated 15 (L) >60 mL/min    Comment: (NOTE) Calculated using the CKD-EPI Creatinine Equation (2021)    Anion gap 8 5 - 15    Comment: Performed at [facility], [address] [place], [place] [address]    Current Facility-Administered Medications  Medication Dose Route Frequency Provider Last Rate Last Admin   atorvastatin (LIPITOR) tablet 20 mg  20 mg Oral Daily Patrecia Pour, NP       calcitRIOL (ROCALTROL) capsule 0.5 mcg  0.5 mcg Oral Daily Kennice Finnie Y, NP       docusate sodium (COLACE) capsule 100 mg  100 mg Oral BID PRN Patrecia Pour, NP       ergocalciferol (VITAMIN D2) capsule 50,000 Units  50,000 Units Oral Q6 weeks Reita Cliche, Shahzad Thomann Y, NP       finasteride (PROSCAR) tablet 5 mg  5 mg Oral Daily Delaine Hernandez Y, NP       fluticasone (FLONASE) 50 MCG/ACT nasal spray 1 spray  1 spray Each Nare Daily Patrecia Pour, NP       folic acid (FOLVITE) tablet 1 mg  1 mg Oral Daily Patrecia Pour, NP       Melatonin ER TBCR 1 tablet  1 tablet Oral QHS Patrecia Pour, NP        multivitamin with minerals tablet 1 tablet  1 tablet Oral Daily Johannes Everage Y, NP       thiamine (VITAMIN B1) tablet 100 mg  100 mg Oral Daily Patrecia Pour, NP       Current Outpatient Medications  Medication Sig Dispense Refill   amLODipine (NORVASC) 5 MG tablet Take 5 mg by mouth daily.     atorvastatin (LIPITOR) 20 MG tablet Take 1 tablet (20 mg total) by mouth daily. 30 tablet 6   calcitRIOL (ROCALTROL) 0.5 MCG capsule Take 1 capsule (0.5 mcg total) by mouth daily. 30 capsule 2   docusate sodium (COLACE) 100 MG capsule Take 1 capsule (100 mg total) by mouth 2 (two) times daily. (Patient taking differently: Take 100 mg by mouth 2 (two) times daily as needed.) 60 capsule 6   doxazosin (CARDURA) 2 MG tablet Take 2 mg by mouth at bedtime.     finasteride (PROSCAR) 5 MG tablet TAKE ONE TABLET BY MOUTH EVERY DAY FOR ENLARGED PROSTATE (Patient taking differently: Take 5 mg by mouth daily. TAKE ONE TABLET BY MOUTH EVERY DAY FOR ENLARGED PROSTATE) 90 tablet 3   fluticasone (FLONASE) 50 MCG/ACT nasal spray USE 2 SPRAYS IN EACH NOSTRIL DAILY AS NEEDED (Patient taking differently: Place 1 spray into both nostrils daily.) 16 g 11   gabapentin (NEURONTIN) 300 MG capsule Take 300 mg by mouth daily.     Insulin Glargine (LANTUS SOLOSTAR) 100 UNIT/ML Solostar Pen Inject 10 Units into the skin Nightly.  losartan (COZAAR) 100 MG tablet Take 100 mg by mouth daily.     MELATONIN TR 1 MG TBCR TAKE 1 TABLET BY MOUTH EVERY NIGHT AT BEDTIME 30 tablet 0   OLANZapine (ZYPREXA) 15 MG tablet Take 15 mg by mouth at bedtime.     pioglitazone (ACTOS) 30 MG tablet Take 30 mg by mouth daily.     traZODone (DESYREL) 100 MG tablet Take 100 mg by mouth at bedtime.     cinacalcet (SENSIPAR) 30 MG tablet Take 30 mg by mouth daily. (Patient not taking: Reported on 01/12/2023)     cloNIDine (CATAPRES) 0.1 MG tablet Take 0.1 mg by mouth 2 (two) times daily. (Patient not taking: Reported on 02/24/2023)     denosumab (PROLIA) 60  MG/ML SOSY injection Inject 1 mL into the skin every 6 (six) months.     ergocalciferol (VITAMIN D2) 50000 units capsule Take 1 capsule (50,000 Units total) by mouth every 6 (six) weeks. (Patient not taking: Reported on 02/24/2023) 30 capsule 6   folic acid (FOLVITE) 1 MG tablet Take 1 tablet (1 mg total) by mouth daily. (Patient not taking: Reported on 02/24/2023) 30 tablet 6   gabapentin (NEURONTIN) 400 MG capsule Take 400 mg by mouth 3 (three) times daily.     glucose blood test strip 1 each by Other route 2 (two) times daily. Use as instructed 100 each 5   HYDROcodone-acetaminophen (NORCO/VICODIN) 5-325 MG tablet Take 2 tablets by mouth every 6 (six) hours as needed for moderate pain. (Patient not taking: Reported on 01/12/2023) 20 tablet 0   Multiple Vitamins-Minerals (MULTIVITAMIN WITH MINERALS) tablet TAKE 1 TABLET BY MOUTH DAILY 30 tablet 5   sevelamer (RENAGEL) 800 MG tablet Take 800 mg by mouth 3 (three) times daily with meals.     sevelamer carbonate (RENVELA) 800 MG tablet Take 1,600 mg by mouth 3 (three) times daily.     sulfamethoxazole-trimethoprim (BACTRIM DS) 800-160 MG tablet Take 1 tablet by mouth 2 (two) times daily.     thiamine (VITAMIN B-1) 100 MG tablet Take 1 tablet (100 mg total) by mouth daily. 30 tablet 6   TRUEPLUS LANCETS 26G MISC 1 each by Does not apply route 2 (two) times daily. 100 each 5    Musculoskeletal: Strength & Muscle Tone: within normal limits Gait & Station:  did not witness Patient leans: N/A  Psychiatric Specialty Exam: Physical Exam Vitals and nursing note reviewed.  Constitutional:      Appearance: Normal appearance.  HENT:     Head: Normocephalic.     Nose: Nose normal.  Pulmonary:     Effort: Pulmonary effort is normal.  Musculoskeletal:     Cervical back: Normal range of motion.  Neurological:     General: No focal deficit present.     Mental Status: He is alert.  Psychiatric:        Attention and Perception: Attention and perception  normal.        Mood and Affect: Affect is blunt.        Speech: Speech normal.        Behavior: Behavior normal. Behavior is cooperative.        Thought Content: Thought content normal.        Cognition and Memory: Cognition is impaired. Memory is impaired.        Judgment: Judgment normal.     Review of Systems  Psychiatric/Behavioral:  Positive for memory loss.   All other systems reviewed and are negative.  Blood pressure (!) 170/85, pulse 88, temperature 98.3 F (36.8 C), temperature source Oral, resp. rate 18, height 5\' 9"  (1.753 m), weight 73 kg, SpO2 100 %.Body mass index is 23.77 kg/m.  General Appearance: Casual  Eye Contact:  Good  Speech:  Normal Rate  Volume:  Normal  Mood:  Euthymic  Affect:  Blunt  Thought Process:  Confused at times  Orientation:  Full (Time, Place, and Person)  Thought Content:  Logical  Suicidal Thoughts:  No  Homicidal Thoughts:  No  Memory:  Immediate;   Fair Recent;   Fair Remote;   Fair  Judgement:  Fair  Insight:  Fair  Psychomotor Activity:  Normal  Concentration:  Concentration: Fair and Attention Span: Fair  Recall:  [employer] of Knowledge:  Fair  Language:  Good  Akathisia:  No  Handed:  Right  AIMS (if indicated):     Assets:  Housing Leisure Time Resilience Social Support  ADL's:  Intact  Cognition:  Impaired,  Mild  Sleep:        Physical Exam: Physical Exam Vitals and nursing note reviewed.  Constitutional:      Appearance: Normal appearance.  HENT:     Head: Normocephalic.     Nose: Nose normal.  Pulmonary:     Effort: Pulmonary effort is normal.  Musculoskeletal:     Cervical back: Normal range of motion.  Neurological:     General: No focal deficit present.     Mental Status: He is alert.  Psychiatric:        Attention and Perception: Attention and perception normal.        Mood and Affect: Affect is blunt.        Speech: Speech normal.        Behavior: Behavior normal. Behavior is cooperative.         Thought Content: Thought content normal.        Cognition and Memory: Cognition is impaired. Memory is impaired.        Judgment: Judgment normal.    Review of Systems  Psychiatric/Behavioral:  Positive for memory loss.   All other systems reviewed and are negative.  Blood pressure (!) 170/85, pulse 88, temperature 98.3 F (36.8 C), temperature source Oral, resp. rate 18, height 5\' 9"  (1.753 m), weight 73 kg, SpO2 100 %. Body mass index is 23.77 kg/m.  Treatment Plan Summary: Adjustment disorder with mixed disturbance of emotions and conduct: Continue current medications and follow up with outpatient provider  Disposition: No evidence of imminent risk to self or others at present.   Patient does not meet criteria for psychiatric inpatient admission. Supportive therapy provided about ongoing stressors.  Waylan Boga, NP 02/24/2023 8:52 AM

## 2023-02-24 NOTE — Assessment & Plan Note (Signed)
-   Restart home antihypertensives 

## 2023-02-24 NOTE — ED Notes (Signed)
PT  VOL  CONSULT  DONE 

## 2023-02-25 DIAGNOSIS — I120 Hypertensive chronic kidney disease with stage 5 chronic kidney disease or end stage renal disease: Secondary | ICD-10-CM | POA: Diagnosis not present

## 2023-02-25 DIAGNOSIS — N186 End stage renal disease: Secondary | ICD-10-CM | POA: Diagnosis not present

## 2023-02-25 LAB — CBC
HCT: 39.1 % (ref 39.0–52.0)
Hemoglobin: 12.7 g/dL — ABNORMAL LOW (ref 13.0–17.0)
MCH: 29 pg (ref 26.0–34.0)
MCHC: 32.5 g/dL (ref 30.0–36.0)
MCV: 89.3 fL (ref 80.0–100.0)
Platelets: 184 10*3/uL (ref 150–400)
RBC: 4.38 MIL/uL (ref 4.22–5.81)
RDW: 16.7 % — ABNORMAL HIGH (ref 11.5–15.5)
WBC: 5.3 10*3/uL (ref 4.0–10.5)
nRBC: 0 % (ref 0.0–0.2)

## 2023-02-25 LAB — RENAL FUNCTION PANEL
Albumin: 3.3 g/dL — ABNORMAL LOW (ref 3.5–5.0)
Anion gap: 10 (ref 5–15)
BUN: 54 mg/dL — ABNORMAL HIGH (ref 8–23)
CO2: 26 mmol/L (ref 22–32)
Calcium: 9.6 mg/dL (ref 8.9–10.3)
Chloride: 103 mmol/L (ref 98–111)
Creatinine, Ser: 3.62 mg/dL — ABNORMAL HIGH (ref 0.61–1.24)
GFR, Estimated: 17 mL/min — ABNORMAL LOW (ref >60)
Glucose, Bld: 131 mg/dL — ABNORMAL HIGH (ref 70–99)
Phosphorus: 4.3 mg/dL (ref 2.5–4.6)
Potassium: 4.5 mmol/L (ref 3.5–5.1)
Sodium: 139 mmol/L (ref 135–145)

## 2023-02-25 LAB — GLUCOSE, CAPILLARY
Glucose-Capillary: 117 mg/dL — ABNORMAL HIGH (ref 70–99)
Glucose-Capillary: 150 mg/dL — ABNORMAL HIGH (ref 70–99)
Glucose-Capillary: 160 mg/dL — ABNORMAL HIGH (ref 70–99)

## 2023-02-25 LAB — HEPATITIS B CORE ANTIBODY, TOTAL: Hep B Core Total Ab: NONREACTIVE

## 2023-02-25 LAB — HEPATITIS B SURFACE ANTIBODY, QUANTITATIVE: Hep B S AB Quant (Post): <3.5 m[IU]/mL — ABNORMAL LOW (ref >9.9)

## 2023-02-25 NOTE — Evaluation (Addendum)
Physical Therapy Evaluation Patient Details Name: JERMERE MESCHKE MRN: [id number] DOB: May 29, 1946 Today's Date: 02/25/2023  History of Present Illness  ESTOL GARRIS is a 77 y.o. male with medical history significant of ESRD on HD, uncontrolled type 2 diabetes, hypertension, alcohol use disorder, dementia, schizophrenia, who presented to the ED for psychiatric evaluation after declining to participate in HD for 1 month.  History obtained through chart review due to patient's altered mental status at baseline, and lack of ability to get in touch with group home to obtain PLOF as reccomended by legal guardian.   Clinical Impression  Patient received reclining in bed. He is oriented only to self but is pleasant and cooperative. Patient unable to provide history due to cognitive limitations, so it was obtained from [name] who owns the group home that patient lives at, at the request of patient's legal guardian. Patient lives in a single story group home with 2 steps to enter and no handrails. Prior to hospitalization, patient was usually I with dressing, bathing, household ambulation without AD, and feeding. He needed occasional help with bathing and dressing, and always had help with community ambulation with no AD, and IADLs. Patient has had 4-5 falls in the last 6 months due to LOB including after getting out of the car. Group home provides 24/7 assistance and Timmy's main concern with patient discharging to group home is patient's willingness to go to hemodialysis, not his mobility. Upon PT evaluation, patient was mod I for bed mobility and needed supervision-CGA with transfers and ambulation with and without a RW. Patient demonstrated no LOB with forwards ambulation but needed frequent verbal cuing for directions to complete mobility tasks safely and completely. Patient appears most limited by his cognition and lacks safety awareness. He would benefit from constant supervision with mobility, a  rolling walker, and a seat while bathing to decrease chances of falling. He would benefit from continued skilled PT to improve his balance and strength for improved functional safety and decreased fall risk. Patient would benefit from skilled physical therapy to address impairments and functional limitations (see PT Problem List below) to work towards stated goals and return to PLOF or maximal functional independence.         Recommendations for follow up therapy are one component of a multi-disciplinary discharge planning process, led by the attending physician.  Recommendations may be updated based on patient status, additional functional criteria and insurance authorization.  Follow Up Recommendations       Assistance Recommended at Discharge Frequent or constant Supervision/Assistance  Patient can return home with the following  A little help with walking and/or transfers;Assistance with cooking/housework;Assist for transportation;A little help with bathing/dressing/bathroom;Help with stairs or ramp for entrance    Equipment Recommendations None recommended by PT  Recommendations for Other Services       Functional Status Assessment Patient has had a recent decline in their functional status and demonstrates the ability to make significant improvements in function in a reasonable and predictable amount of time.     Precautions / Restrictions Precautions Precautions: Fall Restrictions Weight Bearing Restrictions: No      Mobility  Bed Mobility Overal bed mobility: Modified Independent Bed Mobility: Supine to Sit     Supine to sit: Modified independent (Device/Increase time)          Transfers Overall transfer level: Needs assistance Equipment used: None Transfers: Sit to/from Stand Sit to Stand: Supervision           General transfer  comment: Patient goes sit to stand with supervision for safety and cognitive support    Ambulation/Gait Ambulation/Gait  assistance: Supervision, Min guard   Assistive device: Rolling walker (2 wheels), None Gait Pattern/deviations: Step-through pattern Gait velocity: slow     General Gait Details: Patient ambulated approx 220 feet around nursing station with CGA-supervision, half of the time with RW and half without. He handled RW appropriately as if he is used to using one, but was also able to ambulate forwards without AD without loss of balance. He needed cognitive support to know where to go next and was pleasant and cooperative. He was unable to ambualte backwards and continued to turn around and ambulate forward with attempts.  Stairs            Wheelchair Mobility    Modified Rankin (Stroke Patients Only)       Balance Overall balance assessment: Needs assistance   Sitting balance-Leahy Scale: Normal     Standing balance support: No upper extremity supported Standing balance-Leahy Scale: Good Standing balance comment: no LOB when traveling forwards, occasional reaching to steady himself.               High Level Balance Comments: does not effectively ambulate backwards (turns around to go forwards even with cuing).             Pertinent Vitals/Pain Pain Assessment Pain Assessment: Faces Faces Pain Scale: No hurt    Home Living Family/patient expects to be discharged to:: Group home Living Arrangements: Group Home Available Help at Discharge: Available 24 hours/day Type of Home: Group Home Home Access: Stairs to enter Entrance Stairs-Rails: None Entrance Stairs-Number of Steps: 2   Home Layout: One level Home Equipment: Conservation officer, nature (2 wheels);BSC/3in1;Wheelchair - manual Additional Comments: Legal guardian requested to contact group home owner [name] who provided prior level of function and home set up information. Timmy's main concern about pateint coming back to the home is that he was refusing to go to dialysis. He states other MD visits were completed by  doctor coming to the home to see patient.    Prior Function Prior Level of Function : Patient poor historian/Family not available;History of Falls (last six months) (oriented only to self; 4-5 falls in the last 6 months (LOB))             Mobility Comments: Patient ambulates in the home and community with no AD, he has someone walk with someone with outside of the home. ADLs Comments: Patient was I with bathing, dressing, feeding, getting to the bathroom most of the time. gets occational help with dressing and bathing when "he is feeling weak"; gets help with IADLs.     Hand Dominance        Extremity/Trunk Assessment   Upper Extremity Assessment Upper Extremity Assessment: Overall WFL for tasks assessed    Lower Extremity Assessment Lower Extremity Assessment: Generalized weakness    Cervical / Trunk Assessment Cervical / Trunk Assessment: Normal  Communication   Communication: No difficulties  Cognition Arousal/Alertness: Awake/alert Behavior During Therapy: WFL for tasks assessed/performed Overall Cognitive Status: History of cognitive impairments - at baseline                                 General Comments: oriented only to self; Timmy from group home provided history        General Comments General comments (skin integrity, edema, etc.): Needs cognitive  assist with cuing to complete mobility tasks, but is very cooperative.    Exercises     Assessment/Plan    PT Assessment Patient needs continued PT services  PT Problem List Decreased balance;Decreased cognition;Decreased safety awareness;Decreased activity tolerance       PT Treatment Interventions DME instruction;Functional mobility training;Balance training;Patient/family education;Gait training;Therapeutic activities;Neuromuscular re-education;Stair training;Therapeutic exercise;Cognitive remediation    PT Goals (Current goals can be found in the Care Plan section)  Acute Rehab PT  Goals PT Goal Formulation: Patient unable to participate in goal setting    Frequency Min 2X/week     Co-evaluation               AM-PAC PT "6 Clicks" Mobility  Outcome Measure Help needed turning from your back to your side while in a flat bed without using bedrails?: None Help needed moving from lying on your back to sitting on the side of a flat bed without using bedrails?: None Help needed moving to and from a bed to a chair (including a wheelchair)?: A Little Help needed standing up from a chair using your arms (e.g., wheelchair or bedside chair)?: A Little Help needed to walk in hospital room?: A Little Help needed climbing 3-5 steps with a railing? : A Little 6 Click Score: 20    End of Session Equipment Utilized During Treatment: Gait belt Activity Tolerance: Patient tolerated treatment well;Other (comment) (cognition) Patient left: in chair;with call bell/phone within reach;with chair alarm set Nurse Communication: Mobility status PT Visit Diagnosis: Unsteadiness on feet (R26.81);Muscle weakness (generalized) (M62.81)    Time: [id number] PT Time Calculation (min) (ACUTE ONLY): 21 min   Charges:   PT Evaluation $PT Eval Low Complexity: 1 Low          Cella Cappello R. Graylon Good, PT, DPT 02/25/23, 12:11 PM

## 2023-02-25 NOTE — Progress Notes (Signed)
[facility]  ROUNDING NOTE   Subjective:   [name] was admitted to [facility] on 02/23/2023 for ESRD (end stage renal disease) [N18.6] ESRD (end stage renal disease) on dialysis [N18.6, Z99.2] Psychosis, unspecified psychosis type [F29]  Hemodialysis reinitiated yesterday. Tolerated treatment well.   Patient seen and examined on hemodialysis treatment. Second treatment day in a row.     HEMODIALYSIS FLOWSHEET:  Blood Flow Rate (mL/min): 300 mL/min Arterial Pressure (mmHg): -140 mmHg Venous Pressure (mmHg): 150 mmHg TMP (mmHg): 6 mmHg Ultrafiltration Rate (mL/min): 267 mL/min Dialysate Flow Rate (mL/min): 300 ml/min Dialysis Fluid Bolus: Normal Saline Bolus Amount (mL): 100 mL  Using RIJ permcath  Objective:  Vital signs in last 24 hours:  Temp:  [98.1 F (36.7 C)-98.7 F (37.1 C)] 98.1 F (36.7 C) (03/30 0802) Pulse Rate:  [64-80] 70 (03/30 1034) Resp:  [15-23] 19 (03/30 1034) BP: (104-160)/(57-112) 104/80 (03/30 1034) SpO2:  [96 %-100 %] 98 % (03/30 1034) Weight:  [70.7 kg-71.2 kg] 71.1 kg (03/30 1032)  Weight change: -1.8 kg Filed Weights   02/24/23 1159 02/24/23 1631 02/25/23 1032  Weight: 71.2 kg 70.7 kg 71.1 kg    Intake/Output: I/O last 3 completed shifts: In: 480 [P.O.:480] Out: 500 [Other:500]   Intake/Output this shift:  No intake/output data recorded.  Physical Exam: General: Laying in bed  Head: Normocephalic, atraumatic. Moist oral mucosal membranes  Eyes: Anicteric, PERRL  Neck: Supple, trachea midline  Lungs:  clear  Heart: Regular rate and rhythm  Abdomen:  Soft, nontender,   Extremities:  no peripheral edema.  Neurologic: Alert to self and place. No insight, moving all four extremities  Skin: No lesions  Access: Right IJ permcath, maturing left AVF +thrill +bruit    Basic Metabolic Panel: Recent Labs  Lab 02/23/23 2120 02/24/23 1244 02/25/23 0401  NA 141 140 139  K 5.0 4.2 4.5  CL 110 107 103  CO2 23 26  26   GLUCOSE 111* 122* 131*  BUN 66* 51* 54*  CREATININE 3.92* 2.84* 3.62*  CALCIUM 9.5 8.9 9.6  PHOS  --  2.2* 4.3     Liver Function Tests: Recent Labs  Lab 02/24/23 1244 02/25/23 0401  ALBUMIN 3.4* 3.3*   No results for input(s): "LIPASE", "AMYLASE" in the last 168 hours. No results for input(s): "AMMONIA" in the last 168 hours.  CBC: Recent Labs  Lab 02/23/23 2120 02/24/23 1617 02/25/23 0920  WBC 6.4 4.9 5.3  NEUTROABS 3.8 2.7  --   HGB 12.2* 12.5* 12.7*  HCT 39.4 39.7 39.1  MCV 94.5 91.1 89.3  PLT 195 177 184     Cardiac Enzymes: No results for input(s): "CKTOTAL", "CKMB", "CKMBINDEX", "TROPONINI" in the last 168 hours.  BNP: Invalid input(s): "POCBNP"  CBG: Recent Labs  Lab 02/24/23 1751 02/24/23 2055 02/25/23 0931  GLUCAP 195* 103* 117*    Microbiology: Results for orders placed or performed during the hospital encounter of 09/30/22  Resp Panel by RT-PCR (Flu A&B, Covid)     Status: None   Collection Time: 09/30/22 12:06 PM   Specimen: Nasal Swab  Result Value Ref Range Status   SARS Coronavirus 2 by RT PCR NEGATIVE NEGATIVE Final    Comment: (NOTE) SARS-CoV-2 target nucleic acids are NOT DETECTED.  The SARS-CoV-2 RNA is generally detectable in upper respiratory specimens during the acute phase of infection. The lowest concentration of SARS-CoV-2 viral copies this assay can detect is 138 copies/mL. A negative result does not preclude SARS-Cov-2 infection and  should not be used as the sole basis for treatment or other patient management decisions. A negative result may occur with  improper specimen collection/handling, submission of specimen other than nasopharyngeal swab, presence of viral mutation(s) within the areas targeted by this assay, and inadequate number of viral copies(<138 copies/mL). A negative result must be combined with clinical observations, patient history, and epidemiological information. The expected result is  Negative.  Fact Sheet for Patients:  EntrepreneurPulse.com.au  Fact Sheet for Healthcare Providers:  IncredibleEmployment.be  This test is no t yet approved or cleared by the [place] FDA and  has been authorized for detection and/or diagnosis of SARS-CoV-2 by FDA under an Emergency Use Authorization (EUA). This EUA will remain  in effect (meaning this test can be used) for the duration of the COVID-19 declaration under Section 564(b)(1) of the Act, 21 U.S.C.section 360bbb-3(b)(1), unless the authorization is terminated  or revoked sooner.       Influenza A by PCR NEGATIVE NEGATIVE Final   Influenza B by PCR NEGATIVE NEGATIVE Final    Comment: (NOTE) The Xpert Xpress SARS-CoV-2/FLU/RSV plus assay is intended as an aid in the diagnosis of influenza from Nasopharyngeal swab specimens and should not be used as a sole basis for treatment. Nasal washings and aspirates are unacceptable for Xpert Xpress SARS-CoV-2/FLU/RSV testing.  Fact Sheet for Patients: EntrepreneurPulse.com.au  Fact Sheet for Healthcare Providers: IncredibleEmployment.be  This test is not yet approved or cleared by the [place] FDA and has been authorized for detection and/or diagnosis of SARS-CoV-2 by FDA under an Emergency Use Authorization (EUA). This EUA will remain in effect (meaning this test can be used) for the duration of the COVID-19 declaration under Section 564(b)(1) of the Act, 21 U.S.C. section 360bbb-3(b)(1), unless the authorization is terminated or revoked.  Performed at [facility], [address] [place], [place] [address]   Culture, blood (Routine X 2) w Reflex to ID Panel     Status: None   Collection Time: 10/01/22 11:39 AM   Specimen: BLOOD  Result Value Ref Range Status   Specimen Description BLOOD BLOOD RIGHT WRIST  Final   Special Requests   Final    BOTTLES DRAWN AEROBIC AND ANAEROBIC  Blood Culture results may not be optimal due to an inadequate volume of blood received in culture bottles   Culture   Final    NO GROWTH 5 DAYS Performed at [facility], [address] [place], [place] [address]    Report Status 10/06/2022 FINAL  Final  Culture, blood (Routine X 2) w Reflex to ID Panel     Status: None   Collection Time: 10/01/22 11:39 AM   Specimen: BLOOD RIGHT HAND  Result Value Ref Range Status   Specimen Description BLOOD RIGHT HAND  Final   Special Requests   Final    BOTTLES DRAWN AEROBIC AND ANAEROBIC Blood Culture results may not be optimal due to an inadequate volume of blood received in culture bottles   Culture   Final    NO GROWTH 5 DAYS Performed at [facility], [address] [place], [place] [address]    Report Status 10/06/2022 FINAL  Final    Coagulation Studies: No results for input(s): "LABPROT", "INR" in the last 72 hours.  Urinalysis: No results for input(s): "COLORURINE", "LABSPEC", "PHURINE", "GLUCOSEU", "HGBUR", "BILIRUBINUR", "KETONESUR", "PROTEINUR", "UROBILINOGEN", "NITRITE", "LEUKOCYTESUR" in the last 72 hours.  Invalid input(s): "APPERANCEUR"    Imaging: DG Chest 2 View  Result Date: 02/24/2023 CLINICAL  DATA:  Short of breath, end-stage renal disease, diabetes, hypertension EXAM: CHEST - 2 VIEW COMPARISON:  09/30/2022 FINDINGS: Frontal and lateral views of the chest are obtained on 2 images. Stable right internal jugular dialysis catheter. The cardiac silhouette is unremarkable. No acute airspace disease, effusion, or pneumothorax. No acute bony abnormality. IMPRESSION: 1. No acute intrathoracic process. Electronically Signed   By: Randa Ngo M.D.   On: 02/24/2023 18:27     Medications:     amLODipine  5 mg Oral Daily   atorvastatin  20 mg Oral Daily   Chlorhexidine Gluconate Cloth  6 each Topical Q0600   doxazosin  2 mg Oral QHS   finasteride  5 mg Oral Daily   folic acid  1 mg Oral Daily    gabapentin  300 mg Oral Daily   heparin  5,000 Units Subcutaneous Q8H   insulin aspart  0-6 Units Subcutaneous TID WC   losartan  100 mg Oral Daily   melatonin  2.5 mg Oral QHS   multivitamin with minerals  1 tablet Oral Daily   OLANZapine  15 mg Oral QHS   sodium chloride flush  3 mL Intravenous Q12H   thiamine  100 mg Oral Daily   acetaminophen **OR** acetaminophen, docusate sodium, fluticasone, ondansetron **OR** ondansetron (ZOFRAN) IV, polyethylene glycol  Assessment/ Plan:  [name] is a 77 y.o.  male with end stage renal disease on hemodialysis, hypertension, schizophrenia, diabetes mellitus type II, parathyroid adenoma who is admitted to [facility] on 02/23/2023 for ESRD (end stage renal disease) [N18.6] ESRD (end stage renal disease) on dialysis [N18.6, Z99.2] Psychosis, unspecified psychosis type [F29]  [facility] M[facility] Right IJ permcath 74kg.   End Stage Renal Disease: Last outpatient hemodialysis treatment was 01/27/23. Restarted dialysis on admission.  - Seen and examined on hemodialysis treatment. Tolerating treatment well. No indication for dialysis tomorrow. May resume MWF schedule.  - AVF maturing, continue to use tunneled catheter.  - Will need to adjust outpatient dry weight to 71kg.   Hypertension with chronic kidney disease: 104/80. Unclear about home medication compliance. Home regimen of clonidine, doxazosin, losartan, and amlodipine. Not on a diuretic at home.  Anemia of chronic kidney disease: hemoglobin 12.7. No indication for ESA.   Secondary Hyperparathyroidism: with low PTH of 73 on 01/09/23. Calcium and phosphorus at goal. Patient was previously on calcitriol, calcium carbonate, and sevelamer at home. Will hold these agents.  Diabetes mellitus type II with chronic kidney disease: insulin dependent. Hemoglobin A1c of 6.3% on 12/14/2022.    LOS: 1 Maclaine Ahola 3/30/202411:04 AM

## 2023-02-25 NOTE — TOC Progression Note (Addendum)
Transition of Care [facility]) - Progression Note    Patient Details  Name: [name] MRN: [id number] Date of Birth: 10-May-1946  Transition of Care [facility]) CM/SW [place], [place] Phone Number: 02/25/2023, 4:40 PM  Clinical Narrative:    5:10 pm-CSW heard back from Timmy, he states he has concerns about pt returning and having HD on Monday since pt was refusing for almost a month. MD made aware and will call group home provider.   12:37 pm-CSW spoke with pt's LG about pr returning to the group home today, she states she will reach out to the group home and check and will call CSW back.   2:30 pm-CSW confirmed with [facility] that they could provide [facility] PT for pt at dc.   4:41 pm-CSW received text back from [place], she states she has not been able to get in touch with the group home. She did provide me with the address for [place]. [place], for [facility] PT to be set up, point of contact is Timmy [phone].  CSW attempted to call [name] to inquire on pr returning to the group home, no answer. VM left. CSW also attempted to call staff member Beverely Low at [phone], no answer, no vm set up.       Expected Discharge Plan and Services                                               Social Determinants of Health (SDOH) Interventions SDOH Screenings   Food Insecurity: Patient Unable To Answer (02/24/2023)  Housing: Low Risk  (02/24/2023)  Transportation Needs: Patient Unable To Answer (02/24/2023)  Utilities: Patient Unable To Answer (02/24/2023)  Tobacco Use: High Risk (02/23/2023)    Readmission Risk Interventions    10/01/2022   12:29 PM  Readmission Risk Prevention Plan  Transportation Screening Complete  PCP or Specialist Appt within 3-5 Days Complete  HRI or [place] Complete  Social Work Consult for [place] Planning/Counseling Complete  Palliative Care Screening Not Applicable  Medication Review Human resources officer) Complete

## 2023-02-25 NOTE — Evaluation (Signed)
Occupational Therapy Evaluation Patient Details Name: [name] MRN: [id number] DOB: 03/26/1946 Today's Date: 02/25/2023   History of Present Illness [name] is a 77 y.o. male with medical history significant of ESRD on HD, uncontrolled type 2 diabetes, hypertension, alcohol use disorder, dementia, schizophrenia, who presented to the ED for psychiatric evaluation after declining to participate in HD for 1 month.  History obtained through chart review due to patient's altered mental status at baseline, and lack of ability to get in touch with group home to obtain PLOF as reccomended by legal guardian.   Clinical Impression   Patient received supine in bed and agreeable to OT evaluation. Pt presenting with decreased independence in self care, balance, functional mobility/transfers, endurance, and safety awareness. Pt with cognitive deficits at baseline. PLOF and home set up information obtained from [employer] (owner of group home where pt lives). PTA pt was independent for most ADLs with assist for bathing/dressing PRN, group home provides assistance for all IADLs, and pt is normally independent for functional mobility without an AD. During evaluation pt was oriented to self only, required multimodal cues for initiation/sequencing, and VC for overall safety awareness. Pt currently functioning at Mod I for bed mobility, supervision for toilet transfer, and CGA-supervision for functional mobility at room level using a RW. Pt will benefit from skilled acute OT services to address deficits noted below. OT recommends ongoing therapy upon discharge to maximize safety and independence with ADLs, decrease fall risk, decrease caregiver burden, and promote return to PLOF.     Recommendations for follow up therapy are one component of a multi-disciplinary discharge planning process, led by the attending physician.  Recommendations may be updated based on patient status, additional functional  criteria and insurance authorization.   Assistance Recommended at Discharge Frequent or constant Supervision/Assistance  Patient can return home with the following A little help with walking and/or transfers;A little help with bathing/dressing/bathroom;Assistance with cooking/housework;Assist for transportation;Help with stairs or ramp for entrance;Direct supervision/assist for financial management;Direct supervision/assist for medications management    Functional Status Assessment  Patient has had a recent decline in their functional status and demonstrates the ability to make significant improvements in function in a reasonable and predictable amount of time.  Equipment Recommendations  None recommended by OT    Recommendations for Other Services       Precautions / Restrictions Precautions Precautions: Fall Restrictions Weight Bearing Restrictions: No      Mobility Bed Mobility Overal bed mobility: Modified Independent Bed Mobility: Supine to Sit, Sit to Supine     Supine to sit: Modified independent (Device/Increase time) Sit to supine: Modified independent (Device/Increase time)        Transfers Overall transfer level: Needs assistance Equipment used: None Transfers: Sit to/from Stand Sit to Stand: Supervision                  Balance Overall balance assessment: Needs assistance   Sitting balance-Leahy Scale: Normal     Standing balance support: No upper extremity supported, Single extremity supported Standing balance-Leahy Scale: Good                             ADL either performed or assessed with clinical judgement   ADL Overall ADL's : Needs assistance/impaired     Grooming: Min guard;Standing;Wash/dry face;Brushing hair Grooming Details (indicate cue type and reason): pt able to initiate grooming tasks after initial VC  Lower Body Dressing: Set up;Supervision/safety;Sitting/lateral leans Lower Body Dressing  Details (indicate cue type and reason): socks Toilet Transfer: Supervision/safety;Regular Toilet;Grab bars;Rolling walker (2 wheels)           Functional mobility during ADLs: Min guard;Cueing for safety;Cueing for sequencing;Rolling walker (2 wheels) (~67ft at room level)       Vision Patient Visual Report: No change from baseline       Perception     Praxis      Pertinent Vitals/Pain Pain Assessment Pain Assessment: No/denies pain     Hand Dominance     Extremity/Trunk Assessment Upper Extremity Assessment Upper Extremity Assessment: Overall WFL for tasks assessed   Lower Extremity Assessment Lower Extremity Assessment: Defer to PT evaluation   Cervical / Trunk Assessment Cervical / Trunk Assessment: Normal   Communication Communication Communication: No difficulties   Cognition Arousal/Alertness: Awake/alert Behavior During Therapy: WFL for tasks assessed/performed, Flat affect Overall Cognitive Status: History of cognitive impairments - at baseline           General Comments: Pt with h/o dementia and schizophrenia at baseline. Group home owner ([place]) provided history. Oriented to self only, able to follow single step commands, VC t/o for safety awareness and initiation/sequencing.     General Comments     Exercises Other Exercises Other Exercises: OT provided education re: role of OT, OT POC, post acute recs, sitting up for all meals, EOB/OOB mobility with assistance, home/fall safety.     Shoulder Instructions      Home Living Family/patient expects to be discharged to:: Group home Living Arrangements: Group Home Available Help at Discharge: Available 24 hours/day Type of Home: Group Home Home Access: Stairs to enter Entrance Stairs-Number of Steps: 2 Entrance Stairs-Rails: None Home Layout: One level     Bathroom Shower/Tub: Tub/shower unit (with tub bench)   Bathroom Toilet: Standard (with toilet seat riser)     Home Equipment: Rolling  Walker (2 wheels);BSC/3in1;Wheelchair - manual;Toilet riser;Tub bench   Additional Comments: Legal guardian requested to contact group home owner [name] who provided prior level of function and home set up information. Timmy's main concern about patient coming back to the home is that he was refusing to go to dialysis. He states other MD visits were completed by doctor coming to the home to see patient.      Prior Functioning/Environment Prior Level of Function : Patient poor historian/Family not available;History of Falls (last six months)             Mobility Comments: Patient ambulates in the home and community with no AD, he has someone walk with him outside of the home, 4-5 falls in past 6 months ADLs Comments: Patient was IND with bathing, dressing, feeding, getting to the bathroom most of the time. gets occational help with dressing and bathing when "he is feeling weak"; gets help with all IADLs.        OT Problem List: Decreased activity tolerance;Impaired balance (sitting and/or standing);Decreased cognition;Decreased safety awareness      OT Treatment/Interventions: Self-care/ADL training;Therapeutic exercise;Neuromuscular education;Energy conservation;DME and/or AE instruction;Manual therapy;Modalities;Balance training;Patient/family education;Visual/perceptual remediation/compensation;Cognitive remediation/compensation;Therapeutic activities;Splinting    OT Goals(Current goals can be found in the care plan section) Acute Rehab OT Goals Patient Stated Goal: none stated OT Goal Formulation: Patient unable to participate in goal setting Time For Goal Achievement: 03/11/23 Potential to Achieve Goals: Fair   OT Frequency: Min 2X/week    Co-evaluation  AM-PAC OT "6 Clicks" Daily Activity     Outcome Measure Help from another person eating meals?: None Help from another person taking care of personal grooming?: A Little Help from another person  toileting, which includes using toliet, bedpan, or urinal?: A Little Help from another person bathing (including washing, rinsing, drying)?: A Little Help from another person to put on and taking off regular upper body clothing?: A Little Help from another person to put on and taking off regular lower body clothing?: A Little 6 Click Score: 19   End of Session Equipment Utilized During Treatment: Gait belt;Rolling walker (2 wheels) Nurse Communication: Mobility status  Activity Tolerance: Patient tolerated treatment well Patient left: in bed;with call bell/phone within reach;with bed alarm set  OT Visit Diagnosis: Unsteadiness on feet (R26.81);Muscle weakness (generalized) (M62.81);Other symptoms and signs involving cognitive function                Time: [id number] OT Time Calculation (min): 12 min Charges:  OT General Charges $OT Visit: 1 Visit OT Evaluation $OT Eval Low Complexity: 1 Low  [facility] MS, OTR/L ascom [phone]  02/25/23, 1:09 PM

## 2023-02-25 NOTE — Progress Notes (Signed)
  PROGRESS NOTE    [name]  [id number] DOB: 1946-09-19 DOA: 02/23/2023 PCP: [facility] [place]  220A/220A-AA  LOS: 1 day   Brief hospital course:   Assessment & Plan: [name] is a 77 y.o. male with medical history significant of ESRD on HD, uncontrolled type 2 diabetes, hypertension, alcohol use disorder, dementia, schizophrenia, who was IVC'ed and brought to the hospital for dialysis.   * ESRD (end stage renal disease) ([place]) Dialysis non-compliance  Patient presenting for emergent dialysis after refusing dialysis for the past 1 month.  Pt was sating well on room air, labs remarkable only for elevated Cr.  CXR clear. --received dialysis today --nephrology to determine further need for dialysis  Adjustment disorder with mixed disturbance of emotions and conduct Schizophrenia [facility]) Baseline dementia Patient is presenting after 1 month of refusing dialysis at his group home.  Psychiatry has evaluated and do not feel he meets criteria for IVC or inpatient psychiatric hospitalization.  They have diagnosed him with adjustment disorder.   - Continue home Zyprexa  Type 2 diabetes mellitus (HCC) --A1c 5.9 about 4 months ago --d/c BG checks and SSI  Hypertension --cont amlodipine, Cardura, losartan   DVT prophylaxis: Heparin SQ Code Status: Full code  Family Communication: updated group home [phone] [name] on the phone today Level of care: Med-Surg Dispo:   The patient is from: group home Anticipated d/c is to: group home Anticipated d/c date is: 1-2 days   Subjective and Interval History:  Pt tolerated inpatient dialysis.  Could not answer questions coherently.   Objective: Vitals:   02/25/23 1335 02/25/23 1347 02/25/23 1349 02/25/23 1730  BP: 128/75 134/66  136/71  Pulse: 68 69  76  Resp: 18 18  18   Temp:  [id number] F (36.8 C)  99 F (37.2 C)  TempSrc:  Oral  Oral  SpO2: 97% 99%  97%  Weight:   71 kg   Height:         Intake/Output Summary (Last 24 hours) at 02/25/2023 1753 Last data filed at 02/25/2023 1335 Gross per 24 hour  Intake 480 ml  Output 0 ml  Net 480 ml   Filed Weights   02/24/23 1631 02/25/23 1032 02/25/23 1349  Weight: 70.7 kg 71.1 kg 71 kg    Examination:   Constitutional: NAD, alert, oriented to self only HEENT: conjunctivae and lids normal, EOMI CV: No cyanosis.   RESP: normal respiratory effort, on RA   Data Reviewed: I have personally reviewed labs and imaging studies  Time spent: 35 minutes  Enzo Bi, MD [facility] If 7PM-7AM, please contact night-coverage 02/25/2023, 5:53 PM

## 2023-02-25 NOTE — Progress Notes (Signed)
Received patient in bed to unit.  Alert. Disoriented to place and time.   Informed consent signed and in chart.   TX duration: 3hrs  Patient tolerated well.  Transported back to the room  Alert, without acute distress.  Hand-off given to patient's nurse.   Access used: Right chest HD catheter.  Access issues: NONE  Total UF removed: 0 Medication(s) given: NONE    [facility]

## 2023-02-26 DIAGNOSIS — I120 Hypertensive chronic kidney disease with stage 5 chronic kidney disease or end stage renal disease: Secondary | ICD-10-CM | POA: Diagnosis not present

## 2023-02-26 DIAGNOSIS — N186 End stage renal disease: Secondary | ICD-10-CM | POA: Diagnosis not present

## 2023-02-26 LAB — BASIC METABOLIC PANEL
Anion gap: 10 (ref 5–15)
BUN: 50 mg/dL — ABNORMAL HIGH (ref 8–23)
CO2: 26 mmol/L (ref 22–32)
Calcium: 9 mg/dL (ref 8.9–10.3)
Chloride: 102 mmol/L (ref 98–111)
Creatinine, Ser: 3.31 mg/dL — ABNORMAL HIGH (ref 0.61–1.24)
GFR, Estimated: 19 mL/min — ABNORMAL LOW (ref >60)
Glucose, Bld: 112 mg/dL — ABNORMAL HIGH (ref 70–99)
Potassium: 4.4 mmol/L (ref 3.5–5.1)
Sodium: 138 mmol/L (ref 135–145)

## 2023-02-26 LAB — CBC
HCT: 37.7 % — ABNORMAL LOW (ref 39.0–52.0)
Hemoglobin: 12.1 g/dL — ABNORMAL LOW (ref 13.0–17.0)
MCH: 28.9 pg (ref 26.0–34.0)
MCHC: 32.1 g/dL (ref 30.0–36.0)
MCV: 90.2 fL (ref 80.0–100.0)
Platelets: 188 10*3/uL (ref 150–400)
RBC: 4.18 MIL/uL — ABNORMAL LOW (ref 4.22–5.81)
RDW: 16.7 % — ABNORMAL HIGH (ref 11.5–15.5)
WBC: 5.1 10*3/uL (ref 4.0–10.5)
nRBC: 0 % (ref 0.0–0.2)

## 2023-02-26 LAB — MAGNESIUM: Magnesium: 2 mg/dL (ref 1.7–2.4)

## 2023-02-26 NOTE — Progress Notes (Signed)
Mobility Specialist - Progress Note   02/26/23 1155  Mobility  Activity Ambulated with assistance in hallway;Transferred from bed to chair  Level of Assistance Standby assist, set-up cues, supervision of patient - no hands on  Assistive Device None  Distance Ambulated (ft) 180 ft  Activity Response Tolerated well  $Mobility charge 1 Mobility   Pt in simi- fowler position upon entry, utilizing RA. Pt completed bed mob and STS to RW SBA. Pt amb one lap around the NS no AD, CGA to supervision. Pt denied any dizziness or SOB, however stated "feeling tired". Pt returned to room, left seated in the recliner with alarm set and needs within reach. RN notified.   Candie Mile Mobility Specialist 02/26/23 12:06 PM

## 2023-02-26 NOTE — Progress Notes (Signed)
[facility]  ROUNDING NOTE   Subjective:   [name] was admitted to [facility] on 02/23/2023 for ESRD (end stage renal disease) [N18.6] ESRD (end stage renal disease) on dialysis [N18.6, Z99.2] Psychosis, unspecified psychosis type [F29]  Hemodialysis reinitiated on this admission. Tolerated treatments well.   Objective:  Vital signs in last 24 hours:  Temp:  [97.8 F (36.6 C)-99.8 F (37.7 C)] 98.8 F (37.1 C) (03/31 0900) Pulse Rate:  [65-76] 71 (03/31 0900) Resp:  [16-20] 16 (03/31 0441) BP: (118-136)/(57-81) 129/79 (03/31 0900) SpO2:  [96 %-100 %] 99 % (03/31 0900) Weight:  [71 kg-72 kg] 72 kg (03/31 0441)  Weight change: -0.1 kg Filed Weights   02/25/23 1032 02/25/23 1349 02/26/23 0441  Weight: 71.1 kg 71 kg 72 kg    Intake/Output: I/O last 3 completed shifts: In: 720 [P.O.:720] Out: 0    Intake/Output this shift:  No intake/output data recorded.  Physical Exam: General: Laying in bed  Head: Normocephalic, atraumatic. Moist oral mucosal membranes  Eyes: Anicteric, PERRL  Neck: Supple, trachea midline  Lungs:  clear  Heart: Regular rate and rhythm  Abdomen:  Soft, nontender,   Extremities:  no peripheral edema.  Neurologic: Alert to self and place. No insight, moving all four extremities  Skin: No lesions  Access: Right IJ permcath, maturing left AVF +thrill +bruit    Basic Metabolic Panel: Recent Labs  Lab 02/23/23 2120 02/24/23 1244 02/25/23 0401 02/26/23 0353  NA 141 140 139 138  K 5.0 4.2 4.5 4.4  CL 110 107 103 102  CO2 23 26 26 26   GLUCOSE 111* 122* 131* 112*  BUN 66* 51* 54* 50*  CREATININE 3.92* 2.84* 3.62* 3.31*  CALCIUM 9.5 8.9 9.6 9.0  MG  --   --   --  2.0  PHOS  --  2.2* 4.3  --      Liver Function Tests: Recent Labs  Lab 02/24/23 1244 02/25/23 0401  ALBUMIN 3.4* 3.3*    No results for input(s): "LIPASE", "AMYLASE" in the last 168 hours. No results for input(s): "AMMONIA" in the last 168  hours.  CBC: Recent Labs  Lab 02/23/23 2120 02/24/23 1617 02/25/23 0920 02/26/23 0353  WBC 6.4 4.9 5.3 5.1  NEUTROABS 3.8 2.7  --   --   HGB 12.2* 12.5* 12.7* 12.1*  HCT 39.4 39.7 39.1 37.7*  MCV 94.5 91.1 89.3 90.2  PLT 195 177 184 188     Cardiac Enzymes: No results for input(s): "CKTOTAL", "CKMB", "CKMBINDEX", "TROPONINI" in the last 168 hours.  BNP: Invalid input(s): "POCBNP"  CBG: Recent Labs  Lab 02/24/23 1751 02/24/23 2055 02/25/23 0931 02/25/23 1732 02/25/23 2111  GLUCAP 195* 103* 117* 150* 160*     Microbiology: Results for orders placed or performed during the hospital encounter of 09/30/22  Resp Panel by RT-PCR (Flu A&B, Covid)     Status: None   Collection Time: 09/30/22 12:06 PM   Specimen: Nasal Swab  Result Value Ref Range Status   SARS Coronavirus 2 by RT PCR NEGATIVE NEGATIVE Final    Comment: (NOTE) SARS-CoV-2 target nucleic acids are NOT DETECTED.  The SARS-CoV-2 RNA is generally detectable in upper respiratory specimens during the acute phase of infection. The lowest concentration of SARS-CoV-2 viral copies this assay can detect is 138 copies/mL. A negative result does not preclude SARS-Cov-2 infection and should not be used as the sole basis for treatment or other patient management decisions. A negative result may occur with  improper specimen collection/handling, submission of specimen other than nasopharyngeal swab, presence of viral mutation(s) within the areas targeted by this assay, and inadequate number of viral copies(<138 copies/mL). A negative result must be combined with clinical observations, patient history, and epidemiological information. The expected result is Negative.  Fact Sheet for Patients:  EntrepreneurPulse.com.au  Fact Sheet for Healthcare Providers:  IncredibleEmployment.be  This test is no t yet approved or cleared by the [place] FDA and  has been authorized for  detection and/or diagnosis of SARS-CoV-2 by FDA under an Emergency Use Authorization (EUA). This EUA will remain  in effect (meaning this test can be used) for the duration of the COVID-19 declaration under Section 564(b)(1) of the Act, 21 U.S.C.section 360bbb-3(b)(1), unless the authorization is terminated  or revoked sooner.       Influenza A by PCR NEGATIVE NEGATIVE Final   Influenza B by PCR NEGATIVE NEGATIVE Final    Comment: (NOTE) The Xpert Xpress SARS-CoV-2/FLU/RSV plus assay is intended as an aid in the diagnosis of influenza from Nasopharyngeal swab specimens and should not be used as a sole basis for treatment. Nasal washings and aspirates are unacceptable for Xpert Xpress SARS-CoV-2/FLU/RSV testing.  Fact Sheet for Patients: EntrepreneurPulse.com.au  Fact Sheet for Healthcare Providers: IncredibleEmployment.be  This test is not yet approved or cleared by the [place] FDA and has been authorized for detection and/or diagnosis of SARS-CoV-2 by FDA under an Emergency Use Authorization (EUA). This EUA will remain in effect (meaning this test can be used) for the duration of the COVID-19 declaration under Section 564(b)(1) of the Act, 21 U.S.C. section 360bbb-3(b)(1), unless the authorization is terminated or revoked.  Performed at [facility], [address] [place], [place] [address]   Culture, blood (Routine X 2) w Reflex to ID Panel     Status: None   Collection Time: 10/01/22 11:39 AM   Specimen: BLOOD  Result Value Ref Range Status   Specimen Description BLOOD BLOOD RIGHT WRIST  Final   Special Requests   Final    BOTTLES DRAWN AEROBIC AND ANAEROBIC Blood Culture results may not be optimal due to an inadequate volume of blood received in culture bottles   Culture   Final    NO GROWTH 5 DAYS Performed at [facility], [address] [place], [place] [address]    Report Status 10/06/2022 FINAL   Final  Culture, blood (Routine X 2) w Reflex to ID Panel     Status: None   Collection Time: 10/01/22 11:39 AM   Specimen: BLOOD RIGHT HAND  Result Value Ref Range Status   Specimen Description BLOOD RIGHT HAND  Final   Special Requests   Final    BOTTLES DRAWN AEROBIC AND ANAEROBIC Blood Culture results may not be optimal due to an inadequate volume of blood received in culture bottles   Culture   Final    NO GROWTH 5 DAYS Performed at [facility], [address] [place], [place] [address]    Report Status 10/06/2022 FINAL  Final    Coagulation Studies: No results for input(s): "LABPROT", "INR" in the last 72 hours.  Urinalysis: No results for input(s): "COLORURINE", "LABSPEC", "PHURINE", "GLUCOSEU", "HGBUR", "BILIRUBINUR", "KETONESUR", "PROTEINUR", "UROBILINOGEN", "NITRITE", "LEUKOCYTESUR" in the last 72 hours.  Invalid input(s): "APPERANCEUR"    Imaging: DG Chest 2 View  Result Date: 02/24/2023 CLINICAL DATA:  Short of breath, end-stage renal disease, diabetes, hypertension EXAM: CHEST - 2 VIEW COMPARISON:  09/30/2022 FINDINGS: Frontal and lateral  views of the chest are obtained on 2 images. Stable right internal jugular dialysis catheter. The cardiac silhouette is unremarkable. No acute airspace disease, effusion, or pneumothorax. No acute bony abnormality. IMPRESSION: 1. No acute intrathoracic process. Electronically Signed   By: Randa Ngo M.D.   On: 02/24/2023 18:27     Medications:     amLODipine  5 mg Oral Daily   atorvastatin  20 mg Oral Daily   Chlorhexidine Gluconate Cloth  6 each Topical Q0600   doxazosin  2 mg Oral QHS   finasteride  5 mg Oral Daily   folic acid  1 mg Oral Daily   gabapentin  300 mg Oral Daily   heparin  5,000 Units Subcutaneous Q8H   losartan  100 mg Oral Daily   melatonin  2.5 mg Oral QHS   multivitamin with minerals  1 tablet Oral Daily   OLANZapine  15 mg Oral QHS   sodium chloride flush  3 mL Intravenous Q12H   thiamine   100 mg Oral Daily   acetaminophen **OR** acetaminophen, docusate sodium, fluticasone, ondansetron **OR** ondansetron (ZOFRAN) IV, polyethylene glycol  Assessment/ Plan:  [name] is a 77 y.o.  male with end stage renal disease on hemodialysis, hypertension, schizophrenia, diabetes mellitus type II, parathyroid adenoma who is admitted to [facility] on 02/23/2023 for ESRD (end stage renal disease) [N18.6] ESRD (end stage renal disease) on dialysis [N18.6, Z99.2] Psychosis, unspecified psychosis type [F29]  [facility] Right IJ permcath 74kg.   End Stage Renal Disease: Last outpatient hemodialysis treatment was 01/27/23. Restarted dialysis on admission. Tolerated two treatments consecutively.   - hold dialysis for tomorrow and check a 24 hour urine creatinine clearance.  - AVF maturing, continue to use tunneled catheter.  - Will need to adjust outpatient dry weight to 71kg or lower.   Hypertension with chronic kidney disease: 129/79. Unclear about home medication compliance. Home regimen of clonidine, doxazosin, losartan, and amlodipine. Not on a diuretic at home. Clonidine currently held.   Anemia of chronic kidney disease: hemoglobin 12.1. No indication for ESA.   Secondary Hyperparathyroidism: with low PTH of 73 on 01/09/23. Calcium and phosphorus at goal. Patient was previously on calcitriol, calcium carbonate, and sevelamer at home. Will continue to hold these agents.  Diabetes mellitus type II with chronic kidney disease: insulin dependent. Hemoglobin A1c of 6.3% on 12/14/2022.    LOS: 2 Ivori Storr 3/31/202412:31 PM

## 2023-02-26 NOTE — Progress Notes (Signed)
  PROGRESS NOTE    [name]  [id number] DOB: [birth year] DOA: 02/23/2023 PCP: [facility] [place]  220A/220A-AA  LOS: 2 days   Brief hospital course:   Assessment & Plan: [name] is a 77 y.o. male with medical history significant of ESRD on HD, uncontrolled type 2 diabetes, hypertension, alcohol use disorder, dementia, schizophrenia, who was IVC'ed and brought to the hospital for dialysis.   * ESRD (end stage renal disease) ([place]) Dialysis non-compliance  Patient presenting for emergent dialysis after refusing dialysis for the past 1 month.  Pt was sating well on room air, labs remarkable only for elevated Cr.  CXR clear. --Tolerated two treatments consecutively   - hold dialysis for tomorrow and check a 24 hour urine creatinine clearance.   Adjustment disorder with mixed disturbance of emotions and conduct Schizophrenia [facility]) Baseline dementia Patient is presenting after 1 month of refusing dialysis at his group home.  Psychiatry has evaluated and do not feel he meets criteria for IVC or inpatient psychiatric hospitalization.  They have diagnosed him with adjustment disorder.   - Continue home Zyprexa  Type 2 diabetes mellitus (HCC) --A1c 5.9 about 4 months ago --d/c'ed BG checks and SSI  Hypertension --cont amlodipine, Cardura, losartan --hold clonidine   DVT prophylaxis: Heparin SQ Code Status: Full code  Family Communication:  Has legal guardian.   Group home [place]:[id number] [name]   Level of care: Med-Surg Dispo:   The patient is from: group home Anticipated d/c is to: group home Anticipated d/c date is: Tuesday or after   Subjective and Interval History:  Pt reported doing ok, no complaints.   Objective: Vitals:   02/25/23 1730 02/25/23 1958 02/26/23 0441 02/26/23 0900  BP: 136/71 (!) 118/57 136/81 129/79  Pulse: 76 75 65 71  Resp: 18 20 16    Temp: 99 F (37.2 C) 99.8 F (37.7 C) 97.8 F (36.6 C) 98.8 F (37.1 C)   TempSrc: Oral Oral Oral Oral  SpO2: 97% 96% 97% 99%  Weight:   72 kg   Height:        Intake/Output Summary (Last 24 hours) at 02/26/2023 1720 Last data filed at 02/25/2023 2215 Gross per 24 hour  Intake 240 ml  Output --  Net 240 ml   Filed Weights   02/25/23 1032 02/25/23 1349 02/26/23 0441  Weight: 71.1 kg 71 kg 72 kg    Examination:   Constitutional: NAD, alert, oriented to person only HEENT: conjunctivae and lids normal, EOMI CV: No cyanosis.   RESP: normal respiratory effort, on RA Neuro: II - XII grossly intact.   Psych: Normal mood and affect.     Data Reviewed: I have personally reviewed labs and imaging studies  Time spent: 25 minutes  Enzo Bi, MD [facility] If 7PM-7AM, please contact night-coverage 02/26/2023, 5:20 PM

## 2023-02-27 DIAGNOSIS — Z7984 Long term (current) use of oral hypoglycemic drugs: Secondary | ICD-10-CM | POA: Diagnosis not present

## 2023-02-27 DIAGNOSIS — N400 Enlarged prostate without lower urinary tract symptoms: Secondary | ICD-10-CM | POA: Diagnosis not present

## 2023-02-27 DIAGNOSIS — F4325 Adjustment disorder with mixed disturbance of emotions and conduct: Secondary | ICD-10-CM | POA: Diagnosis not present

## 2023-02-27 DIAGNOSIS — Z794 Long term (current) use of insulin: Secondary | ICD-10-CM | POA: Diagnosis not present

## 2023-02-27 DIAGNOSIS — D631 Anemia in chronic kidney disease: Secondary | ICD-10-CM | POA: Diagnosis not present

## 2023-02-27 DIAGNOSIS — I120 Hypertensive chronic kidney disease with stage 5 chronic kidney disease or end stage renal disease: Secondary | ICD-10-CM | POA: Diagnosis not present

## 2023-02-27 DIAGNOSIS — N2581 Secondary hyperparathyroidism of renal origin: Secondary | ICD-10-CM | POA: Diagnosis not present

## 2023-02-27 DIAGNOSIS — F17210 Nicotine dependence, cigarettes, uncomplicated: Secondary | ICD-10-CM | POA: Diagnosis not present

## 2023-02-27 DIAGNOSIS — E8770 Fluid overload, unspecified: Secondary | ICD-10-CM | POA: Diagnosis not present

## 2023-02-27 DIAGNOSIS — E1122 Type 2 diabetes mellitus with diabetic chronic kidney disease: Secondary | ICD-10-CM | POA: Diagnosis not present

## 2023-02-27 DIAGNOSIS — Z7189 Other specified counseling: Secondary | ICD-10-CM | POA: Diagnosis not present

## 2023-02-27 DIAGNOSIS — Z992 Dependence on renal dialysis: Secondary | ICD-10-CM | POA: Diagnosis not present

## 2023-02-27 DIAGNOSIS — E890 Postprocedural hypothyroidism: Secondary | ICD-10-CM | POA: Diagnosis not present

## 2023-02-27 DIAGNOSIS — N186 End stage renal disease: Secondary | ICD-10-CM | POA: Diagnosis not present

## 2023-02-27 DIAGNOSIS — R2681 Unsteadiness on feet: Secondary | ICD-10-CM | POA: Diagnosis not present

## 2023-02-27 DIAGNOSIS — H9190 Unspecified hearing loss, unspecified ear: Secondary | ICD-10-CM | POA: Diagnosis not present

## 2023-02-27 DIAGNOSIS — Z79899 Other long term (current) drug therapy: Secondary | ICD-10-CM | POA: Diagnosis not present

## 2023-02-27 DIAGNOSIS — F209 Schizophrenia, unspecified: Secondary | ICD-10-CM | POA: Diagnosis not present

## 2023-02-27 DIAGNOSIS — Z91158 Patient's noncompliance with renal dialysis for other reason: Secondary | ICD-10-CM | POA: Diagnosis not present

## 2023-02-27 DIAGNOSIS — M6281 Muscle weakness (generalized): Secondary | ICD-10-CM | POA: Diagnosis not present

## 2023-02-27 DIAGNOSIS — D351 Benign neoplasm of parathyroid gland: Secondary | ICD-10-CM | POA: Diagnosis not present

## 2023-02-27 DIAGNOSIS — F0390 Unspecified dementia without behavioral disturbance: Secondary | ICD-10-CM | POA: Diagnosis not present

## 2023-02-27 DIAGNOSIS — F2089 Other schizophrenia: Secondary | ICD-10-CM | POA: Diagnosis not present

## 2023-02-27 LAB — BASIC METABOLIC PANEL
Anion gap: 10 (ref 5–15)
BUN: 75 mg/dL — ABNORMAL HIGH (ref 8–23)
CO2: 25 mmol/L (ref 22–32)
Calcium: 9 mg/dL (ref 8.9–10.3)
Chloride: 104 mmol/L (ref 98–111)
Creatinine, Ser: 4.46 mg/dL — ABNORMAL HIGH (ref 0.61–1.24)
GFR, Estimated: 13 mL/min — ABNORMAL LOW (ref >60)
Glucose, Bld: 124 mg/dL — ABNORMAL HIGH (ref 70–99)
Potassium: 4.7 mmol/L (ref 3.5–5.1)
Sodium: 139 mmol/L (ref 135–145)

## 2023-02-27 LAB — CBC
HCT: 37.6 % — ABNORMAL LOW (ref 39.0–52.0)
Hemoglobin: 11.9 g/dL — ABNORMAL LOW (ref 13.0–17.0)
MCH: 28.3 pg (ref 26.0–34.0)
MCHC: 31.6 g/dL (ref 30.0–36.0)
MCV: 89.5 fL (ref 80.0–100.0)
Platelets: 174 10*3/uL (ref 150–400)
RBC: 4.2 MIL/uL — ABNORMAL LOW (ref 4.22–5.81)
RDW: 16.5 % — ABNORMAL HIGH (ref 11.5–15.5)
WBC: 4.6 10*3/uL (ref 4.0–10.5)
nRBC: 0 % (ref 0.0–0.2)

## 2023-02-27 LAB — MAGNESIUM: Magnesium: 2.2 mg/dL (ref 1.7–2.4)

## 2023-02-27 LAB — PARATHYROID HORMONE, INTACT (NO CA): PTH: 52 pg/mL (ref 15–65)

## 2023-02-27 NOTE — Progress Notes (Signed)
[facility]  ROUNDING NOTE   Subjective:   [name] was admitted to [facility] on 02/23/2023 for ESRD (end stage renal disease) [N18.6] ESRD (end stage renal disease) on dialysis [N18.6, Z99.2] Psychosis, unspecified psychosis type [F29]  Patient seen independently sitting up in bed Alert and oriented to self Completed breakfast tray at bedside  Creatinine 4.43  Objective:  Vital signs in last 24 hours:  Temp:  [98.3 F (36.8 C)-98.7 F (37.1 C)] 98.3 F (36.8 C) (04/01 0825) Pulse Rate:  [59-79] 68 (04/01 0825) Resp:  [18-20] 18 (04/01 0825) BP: (124-151)/(54-87) 124/73 (04/01 0825) SpO2:  [99 %-100 %] 100 % (04/01 0825) Weight:  [72 kg] 72 kg (04/01 0423)  Weight change: 0.9 kg Filed Weights   02/25/23 1349 02/26/23 0441 02/27/23 0423  Weight: 71 kg 72 kg 72 kg    Intake/Output: I/O last 3 completed shifts: In: 480 [P.O.:480] Out: 0    Intake/Output this shift:  No intake/output data recorded.  Physical Exam: General: NAD, sitting up in bed  Head: Normocephalic, atraumatic. Moist oral mucosal membranes  Eyes: Anicteric  Lungs:  Clear to auscultation   Heart: Regular rate and rhythm  Abdomen:  Soft, nontender  Extremities:  no peripheral edema.  Neurologic: Alert to self and place. No insight, moving all four extremities  Skin: No lesions  Access: Right IJ permcath, maturing left AVF +thrill +bruit    Basic Metabolic Panel: Recent Labs  Lab 02/23/23 2120 02/24/23 1244 02/25/23 0401 02/26/23 0353 02/27/23 0620  NA 141 140 139 138 139  K 5.0 4.2 4.5 4.4 4.7  CL 110 107 103 102 104  CO2 23 26 26 26 25   GLUCOSE 111* 122* 131* 112* 124*  BUN 66* 51* 54* 50* 75*  CREATININE 3.92* 2.84* 3.62* 3.31* 4.46*  CALCIUM 9.5 8.9 9.6 9.0 9.0  MG  --   --   --  2.0 2.2  PHOS  --  2.2* 4.3  --   --      Liver Function Tests: Recent Labs  Lab 02/24/23 1244 02/25/23 0401  ALBUMIN 3.4* 3.3*    No results for input(s): "LIPASE",  "AMYLASE" in the last 168 hours. No results for input(s): "AMMONIA" in the last 168 hours.  CBC: Recent Labs  Lab 02/23/23 2120 02/24/23 1617 02/25/23 0920 02/26/23 0353 02/27/23 0620  WBC 6.4 4.9 5.3 5.1 4.6  NEUTROABS 3.8 2.7  --   --   --   HGB 12.2* 12.5* 12.7* 12.1* 11.9*  HCT 39.4 39.7 39.1 37.7* 37.6*  MCV 94.5 91.1 89.3 90.2 89.5  PLT 195 177 184 188 174     Cardiac Enzymes: No results for input(s): "CKTOTAL", "CKMB", "CKMBINDEX", "TROPONINI" in the last 168 hours.  BNP: Invalid input(s): "POCBNP"  CBG: Recent Labs  Lab 02/24/23 1751 02/24/23 2055 02/25/23 0931 02/25/23 1732 02/25/23 2111  GLUCAP 195* 103* 117* 150* 160*     Microbiology: Results for orders placed or performed during the hospital encounter of 09/30/22  Resp Panel by RT-PCR (Flu A&B, Covid)     Status: None   Collection Time: 09/30/22 12:06 PM   Specimen: Nasal Swab  Result Value Ref Range Status   SARS Coronavirus 2 by RT PCR NEGATIVE NEGATIVE Final    Comment: (NOTE) SARS-CoV-2 target nucleic acids are NOT DETECTED.  The SARS-CoV-2 RNA is generally detectable in upper respiratory specimens during the acute phase of infection. The lowest concentration of SARS-CoV-2 viral copies this assay can detect is  138 copies/mL. A negative result does not preclude SARS-Cov-2 infection and should not be used as the sole basis for treatment or other patient management decisions. A negative result may occur with  improper specimen collection/handling, submission of specimen other than nasopharyngeal swab, presence of viral mutation(s) within the areas targeted by this assay, and inadequate number of viral copies(<138 copies/mL). A negative result must be combined with clinical observations, patient history, and epidemiological information. The expected result is Negative.  Fact Sheet for Patients:  EntrepreneurPulse.com.au  Fact Sheet for Healthcare Providers:   IncredibleEmployment.be  This test is no t yet approved or cleared by the [place] FDA and  has been authorized for detection and/or diagnosis of SARS-CoV-2 by FDA under an Emergency Use Authorization (EUA). This EUA will remain  in effect (meaning this test can be used) for the duration of the COVID-19 declaration under Section 564(b)(1) of the Act, 21 U.S.C.section 360bbb-3(b)(1), unless the authorization is terminated  or revoked sooner.       Influenza A by PCR NEGATIVE NEGATIVE Final   Influenza B by PCR NEGATIVE NEGATIVE Final    Comment: (NOTE) The Xpert Xpress SARS-CoV-2/FLU/RSV plus assay is intended as an aid in the diagnosis of influenza from Nasopharyngeal swab specimens and should not be used as a sole basis for treatment. Nasal washings and aspirates are unacceptable for Xpert Xpress SARS-CoV-2/FLU/RSV testing.  Fact Sheet for Patients: EntrepreneurPulse.com.au  Fact Sheet for Healthcare Providers: IncredibleEmployment.be  This test is not yet approved or cleared by the [place] FDA and has been authorized for detection and/or diagnosis of SARS-CoV-2 by FDA under an Emergency Use Authorization (EUA). This EUA will remain in effect (meaning this test can be used) for the duration of the COVID-19 declaration under Section 564(b)(1) of the Act, 21 U.S.C. section 360bbb-3(b)(1), unless the authorization is terminated or revoked.  Performed at [facility], [address] [place], [place] [address]   Culture, blood (Routine X 2) w Reflex to ID Panel     Status: None   Collection Time: 10/01/22 11:39 AM   Specimen: BLOOD  Result Value Ref Range Status   Specimen Description BLOOD BLOOD RIGHT WRIST  Final   Special Requests   Final    BOTTLES DRAWN AEROBIC AND ANAEROBIC Blood Culture results may not be optimal due to an inadequate volume of blood received in culture bottles   Culture    Final    NO GROWTH 5 DAYS Performed at [facility], [address] [place], [place] [address]    Report Status 10/06/2022 FINAL  Final  Culture, blood (Routine X 2) w Reflex to ID Panel     Status: None   Collection Time: 10/01/22 11:39 AM   Specimen: BLOOD RIGHT HAND  Result Value Ref Range Status   Specimen Description BLOOD RIGHT HAND  Final   Special Requests   Final    BOTTLES DRAWN AEROBIC AND ANAEROBIC Blood Culture results may not be optimal due to an inadequate volume of blood received in culture bottles   Culture   Final    NO GROWTH 5 DAYS Performed at [facility], [address] [place],  [address]    Report Status 10/06/2022 FINAL  Final    Coagulation Studies: No results for input(s): "LABPROT", "INR" in the last 72 hours.  Urinalysis: No results for input(s): "COLORURINE", "LABSPEC", "PHURINE", "GLUCOSEU", "HGBUR", "BILIRUBINUR", "KETONESUR", "PROTEINUR", "UROBILINOGEN", "NITRITE", "LEUKOCYTESUR" in the last 72 hours.  Invalid input(s): "APPERANCEUR"  Imaging: No results found.   Medications:     amLODipine  5 mg Oral Daily   atorvastatin  20 mg Oral Daily   Chlorhexidine Gluconate Cloth  6 each Topical Q0600   doxazosin  2 mg Oral QHS   finasteride  5 mg Oral Daily   folic acid  1 mg Oral Daily   gabapentin  300 mg Oral Daily   heparin  5,000 Units Subcutaneous Q8H   losartan  100 mg Oral Daily   melatonin  2.5 mg Oral QHS   multivitamin with minerals  1 tablet Oral Daily   OLANZapine  15 mg Oral QHS   thiamine  100 mg Oral Daily   acetaminophen **OR** acetaminophen, docusate sodium, fluticasone, ondansetron **OR** ondansetron (ZOFRAN) IV, polyethylene glycol  Assessment/ Plan:  [name] is a 77 y.o.  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   End Stage Renal Disease: Last outpatient hemodialysis treatment was 01/27/23. Restarted dialysis on admission. Tolerated two treatments consecutively.   - 24 hr creatinine clearance in progress - AVF maturing, continue to use tunneled catheter.  - Will need to adjust outpatient dry weight to 71kg or lower.  - Will continue to hold dialysis for now.  Hypertension with chronic kidney disease: Unclear about home medication compliance. Home regimen of clonidine, doxazosin, losartan, and amlodipine. Not on a diuretic at home. Clonidine currently held.  Blood pressure 124/73   Anemia of chronic kidney disease: hemoglobin remain within goal.  No indication for ESA.   Secondary Hyperparathyroidism: with low PTH of 73 on 01/09/23. Calcium and phosphorus at goal. Patient was previously on calcitriol, calcium carbonate, and sevelamer at home. Will continue to hold these agents.  Diabetes mellitus type II with chronic kidney disease: insulin dependent. Hemoglobin A1c of 6.3% on 12/14/2022.   Primary team will manage SSI   LOS: 3 [place] 4/1/20241:17 PM

## 2023-02-27 NOTE — Consult Note (Signed)
Consultation Note Date: 02/27/2023   Patient Name: [name]  DOB: [birth year]  MRN: [id number]  Age / Sex: 77 y.o., male  PCP: [facility] Referring Physician: Enzo Bi, MD  Reason for Consultation:  "multiple comorbidities with ESRD on HD for the last year. Patient has been refusing HD for the last 1 month. He has legal guardian due to his dementia and schizophrenia. Unclear on goals of care"  HPI/Patient Profile: 77 y.o. male  with past medical history of ESRD on HD, dementia, lives in a group home admitted on 02/23/2023 under IVC due to not going to dialysis for one month. PMT consulted for [place].   Primary Decision Maker LEGAL GUARDIAN - [name]  Discussion: Chart reviewed including labs, progress notes, imaging from this and previous encounters.  On eval patient was awake and alert. Ate a good amount of his lunch tray. He has been compliant with dialysis during admission.  He cannot answer as to why he didn't want to go to dialysis.  He tells me he plans to continue dialysis now and after discharge.  I spoke with his legal guardian. She was under the impression that Palliative was going to be consulted only if patient did not continue to be compliant with HD.  She endorses plan for continued dialysis and hopeful that patient will continue dialysis at discharge.  We discussed if he does not continue with dialysis, would be appropriate to consider hospice and she agrees.     SUMMARY OF RECOMMENDATIONS -Continue current plan of care -Pt has been compliant with HD and agrees to continue outpatient -If he again refuses dialysis the legal guardian agrees that hospice will need to be discussed   Code Status/Advance Care Planning: Full code   Prognosis:   Unable to determine  Discharge Planning: Home with Home Health  Primary Diagnoses: Present on Admission:   Adjustment disorder with mixed disturbance of emotions and conduct  ESRD (end stage renal disease)  Hypertension  Schizophrenia   Review of Systems  Physical Exam  Vital Signs: BP 124/73 (BP Location: Right Arm)   Pulse 68   Temp 98.3 F (36.8 C) (Oral)   Resp 18   Ht 5\' 9"  (1.753 m)   Wt 72 kg   SpO2 100%   BMI 23.44 kg/m  Pain Scale: 0-10 POSS *See Group Information*: 1-Acceptable,Awake and alert Pain Score: 0-No pain   SpO2: SpO2: 100 % O2 Device:SpO2: 100 % O2 Flow Rate: .   IO: Intake/output summary:  Intake/Output Summary (Last 24 hours) at 02/27/2023 1433 Last data filed at 02/27/2023 0540 Gross per 24 hour  Intake 240 ml  Output 0 ml  Net 240 ml    LBM: Last BM Date :  (Possible pre-admit?) Baseline Weight: Weight: 73 kg Most recent weight: Weight: 72 kg       Thank you for this consult. Palliative medicine will continue to follow and assist as needed.  Time Total:  75 minutes Greater than 50%  of this time was  spent counseling and coordinating care related to the above assessment and plan.  Signed by: Mariana Kaufman, AGNP-C Palliative Medicine    Please contact Palliative Medicine Team phone at [phone] for questions and concerns.  For individual provider: See Shea Evans

## 2023-02-27 NOTE — TOC Progression Note (Signed)
Transition of Care [facility]) - Progression Note    Patient Details  Name: KAVONTAE WOLFGRAMM MRN: [id number] Date of Birth: 1946-05-12  Transition of Care [facility]) CM/SW Contact  Beverly Sessions, RN Phone Number: 02/27/2023, 3:32 PM  Clinical Narrative:    Message sent to MD to determine when patient will be medically ready for discharge    Expected Discharge Plan: [place] Barriers to Discharge: Continued Medical Work up  Expected Discharge Plan and Services In-house Referral: Clinical Social Work     Living arrangements for the past 2 months: Group Home                           HH Arranged: PT, RN [place] Agency: [place] Date [place]: 02/25/23 Time HH Agency Contacted: 1430 Representative spoke with at [place]: [place] Determinants of Health ([place]) Interventions [place]: Patient Unable To Answer (02/24/2023)  Housing: Low Risk  (02/24/2023)  Transportation Needs: Patient Unable To Answer (02/24/2023)  Utilities: Patient Unable To Answer (02/24/2023)  Tobacco Use: High Risk (02/23/2023)    Readmission Risk Interventions    10/01/2022   12:29 PM  Readmission Risk Prevention Plan  Transportation Screening Complete  PCP or Specialist Appt within 3-5 Days Complete  HRI or [place] Complete  Social Work Consult for [place] Planning/Counseling Complete  Palliative Care Screening Not Applicable  Medication Review Press photographer) Complete

## 2023-02-27 NOTE — Progress Notes (Signed)
Mobility Specialist - Progress Note   02/27/23 1026  Mobility  Activity Ambulated independently in hallway  Level of Assistance Modified independent, requires aide device or extra time  Assistive Device None  Distance Ambulated (ft) 180 ft  Activity Response Tolerated well  $Mobility charge 1 Mobility   Pt supine upon entry, utilizing RA. Pt completed bed mob and sit to stand ModI-- extra time required. Pt amb one lap around the NS SBA to supervision, tolerated well. Pt returned to room, left supine with alarm set and needs within reach.   Candie [place] Mobility Specialist 02/27/23 10:30 AM

## 2023-02-27 NOTE — Progress Notes (Signed)
  PROGRESS NOTE    [name]  [id number] DOB: 1946/07/06 DOA: 02/23/2023 PCP: [facility] [place]  220A/220A-AA  LOS: 3 days   Brief hospital course:   Assessment & Plan: [name] is a 77 y.o. male with medical history significant of ESRD on HD, uncontrolled type 2 diabetes, hypertension, alcohol use disorder, dementia, schizophrenia, who was IVC'ed and brought to the hospital for dialysis.   * ESRD (end stage renal disease) ([place]) Dialysis non-compliance  Patient presenting for emergent dialysis after refusing dialysis for the past 1 month.  Pt was sating well on room air, labs remarkable only for elevated Cr.  CXR clear. --Tolerated two treatments consecutively   - hold dialysis check a 24 hour urine creatinine clearance.   Adjustment disorder with mixed disturbance of emotions and conduct Schizophrenia [facility]) Baseline dementia Patient is presenting after 1 month of refusing dialysis at his group home.  Psychiatry has evaluated and do not feel he meets criteria for IVC or inpatient psychiatric hospitalization.  They have diagnosed him with adjustment disorder.   - Continue home Zyprexa  Type 2 diabetes mellitus (HCC) --A1c 5.9 about 4 months ago --d/c'ed BG checks and SSI  Hypertension --cont amlodipine, Cardura, losartan --hold clonidine   DVT prophylaxis: Heparin SQ Code Status: Full code  Family Communication:  Has legal guardian.   Group home [id number] [name]   Level of care: Med-Surg Dispo:   The patient is from: group home Anticipated d/c is to: group home Anticipated d/c date is: Tuesday or after   Subjective and Interval History:  No new complaint today.     Objective: Vitals:   02/27/23 0423 02/27/23 0825 02/27/23 1601 02/27/23 2016  BP: (!) 131/54 124/73 (!) 140/70 (!) 115/57  Pulse: (!) 59 68 69 68  Resp: 20 18 20 16   Temp: 98.5 F (36.9 C) 98.3 F (36.8 C) 98.7 F (37.1 C) 99.1 F (37.3 C)  TempSrc: Oral  Oral  Oral  SpO2: 99% 100% 96% 99%  Weight: 72 kg     Height:        Intake/Output Summary (Last 24 hours) at 02/27/2023 2146 Last data filed at 02/27/2023 2130 Gross per 24 hour  Intake 240 ml  Output 0 ml  Net 240 ml   Filed Weights   02/25/23 1349 02/26/23 0441 02/27/23 0423  Weight: 71 kg 72 kg 72 kg    Examination:   Constitutional: NAD CV: No cyanosis.   RESP: normal respiratory effort, on RA   Data Reviewed: I have personally reviewed labs and imaging studies  Time spent: 25 minutes  Seth Bi, MD [facility] If 7PM-7AM, please contact night-coverage 02/27/2023, 9:46 PM

## 2023-02-27 NOTE — Progress Notes (Signed)
Occupational Therapy Treatment Patient Details Name: [name] MRN: [id number] DOB: 1946/07/19 Today's Date: 02/27/2023   History of present illness [name] is a 77 y.o. male with medical history significant of ESRD on HD, uncontrolled type 2 diabetes, hypertension, alcohol use disorder, dementia, schizophrenia, who presented to the ED for psychiatric evaluation after declining to participate in HD for 1 month.  History obtained through chart review due to patient's altered mental status at baseline, and lack of ability to get in touch with group home to obtain PLOF as reccomended by legal guardian.   OT comments  Upon entering the room, pt supine in bed with no c/o pain in bed and agreeable to OT intervention. Pt performs bed mobility without physical assistance from flat bed. Pt dons B socks with set up A to obtain needed items. Pt stands at sink to wash hands with supervision. Pt declined toileting and additional self care needs. He ambulates in room without use of AD and supervision for safety to doorway where PT arrives. Pt transitions easily to PT session with no complains. Continue per OT POC.    Recommendations for follow up therapy are one component of a multi-disciplinary discharge planning process, led by the attending physician.  Recommendations may be updated based on patient status, additional functional criteria and insurance authorization.    Assistance Recommended at Discharge Frequent or constant Supervision/Assistance  Patient can return home with the following  A little help with walking and/or transfers;A little help with bathing/dressing/bathroom;Assistance with cooking/housework;Assist for transportation;Help with stairs or ramp for entrance;Direct supervision/assist for financial management;Direct supervision/assist for medications management   Equipment Recommendations  None recommended by OT       Precautions / Restrictions Precautions Precautions: Fall        Mobility Bed Mobility Overal bed mobility: Modified Independent                  Transfers Overall transfer level: Needs assistance Equipment used: None Transfers: Sit to/from Stand Sit to Stand: Supervision                 Balance Overall balance assessment: Needs assistance   Sitting balance-Leahy Scale: Normal     Standing balance support: No upper extremity supported, Single extremity supported Standing balance-Leahy Scale: Good                             ADL either performed or assessed with clinical judgement   ADL Overall ADL's : Needs assistance/impaired     Grooming: Standing;Wash/dry hands;Supervision/safety               Lower Body Dressing: Sitting/lateral leans;Supervision/safety Lower Body Dressing Details (indicate cue type and reason): socks                    Extremity/Trunk Assessment Upper Extremity Assessment Upper Extremity Assessment: Overall WFL for tasks assessed            Vision Patient Visual Report: No change from baseline            Cognition Arousal/Alertness: Awake/alert Behavior During Therapy: WFL for tasks assessed/performed, Flat affect Overall Cognitive Status: History of cognitive impairments - at baseline                                 General Comments: Pt with h/o dementia and schizophrenia at baseline.  Oriented to self only, able to follow single step commands, VC t/o for safety awareness and initiation/sequencing but very pleasant overall.                   Pertinent Vitals/ Pain       Pain Assessment Pain Assessment: No/denies pain Faces Pain Scale: No hurt         Frequency  Min 1X/week        Progress Toward Goals  OT Goals(current goals can now be found in the care plan section)  Progress towards OT goals: Progressing toward goals     Plan Discharge plan remains appropriate;Frequency needs to be updated       AM-PAC OT "6  Clicks" Daily Activity     Outcome Measure   Help from another person eating meals?: None Help from another person taking care of personal grooming?: None Help from another person toileting, which includes using toliet, bedpan, or urinal?: A Little Help from another person bathing (including washing, rinsing, drying)?: A Little Help from another person to put on and taking off regular upper body clothing?: None Help from another person to put on and taking off regular lower body clothing?: A Little 6 Click Score: 21    End of Session    OT Visit Diagnosis: Unsteadiness on feet (R26.81);Muscle weakness (generalized) (M62.81);Other symptoms and signs involving cognitive function   Activity Tolerance Patient tolerated treatment well   Patient Left Other (comment) (transitioned to PT session)   Nurse Communication Mobility status        Time: [id number] OT Time Calculation (min): 10 min  Charges: OT General Charges $OT Visit: 1 Visit OT Treatments $Self Care/Home Management : 8-22 mins  Darleen Crocker, MS, OTR/L , CBIS ascom [phone]  02/27/23, 12:53 PM

## 2023-02-27 NOTE — Progress Notes (Signed)
Physical Therapy Treatment Patient Details Name: [name] MRN: [id number] DOB: 1946/07/14 Today's Date: 02/27/2023   History of Present Illness [name] is a 77 y.o. male with medical history significant of ESRD on HD, uncontrolled type 2 diabetes, hypertension, alcohol use disorder, dementia, schizophrenia, who presented to the ED for psychiatric evaluation after declining to participate in HD for 1 month.  History obtained through chart review due to patient's altered mental status at baseline, and lack of ability to get in touch with group home to obtain PLOF as reccomended by legal guardian.    PT Comments    Pt received in standing via OT handoff.  Pt performing well with ambulation around the nursing station, noting no LOB and without the use of any AD.  Pt then stopped outside room at railing in order to perform standing exercises as noted below.  Pt requires significant verbal cuing and visual demonstration of the exercises in order to perform correctly, however has very little carryover with subsequent attempts.  Pt left in room with bed alarm set and all other needs met.      Recommendations for follow up therapy are one component of a multi-disciplinary discharge planning process, led by the attending physician.  Recommendations may be updated based on patient status, additional functional criteria and insurance authorization.     Assistance Recommended at Discharge Frequent or constant Supervision/Assistance  Patient can return home with the following A little help with walking and/or transfers;Assistance with cooking/housework;Assist for transportation;A little help with bathing/dressing/bathroom;Help with stairs or ramp for entrance   Equipment Recommendations  None recommended by PT    Recommendations for Other Services       Precautions / Restrictions Precautions Precautions: Fall     Mobility  Bed Mobility               General bed mobility  comments: pt received in standing via handoff from OT.    Transfers Overall transfer level: Needs assistance Equipment used: None                    Ambulation/Gait Ambulation/Gait assistance: Supervision, Min guard Gait Distance (Feet): 160 Feet Assistive device: None Gait Pattern/deviations: Step-through pattern Gait velocity: slow     General Gait Details: Pt ambulated without use of RW today and maintained balance throughout.   Stairs             Wheelchair Mobility    Modified Rankin (Stroke Patients Only)       Balance Overall balance assessment: Needs assistance   Sitting balance-Leahy Scale: Normal     Standing balance support: No upper extremity supported, Single extremity supported Standing balance-Leahy Scale: Good                              Cognition Arousal/Alertness: Awake/alert Behavior During Therapy: WFL for tasks assessed/performed, Flat affect Overall Cognitive Status: History of cognitive impairments - at baseline                                 General Comments: Pt with h/o dementia and schizophrenia at baseline.  Oriented to self only, able to follow single step commands, VC t/o for safety awareness and initiation/sequencing but very pleasant overall.        Exercises Other Exercises Other Exercises:  Standing hip abduction with UE supported at hand rail,  x10 each LE Standing hip extension with UE supported at hand rail, x10 each LE Standing hip flexion/high knee marches with UE supported at hand rail, x10 each LE Standing squats with UE supported at hand rail, x10 each LE  General Comments        Pertinent Vitals/Pain Pain Assessment Pain Assessment: No/denies pain    Home Living                          Prior Function            PT Goals (current goals can now be found in the care plan section) Acute Rehab PT Goals PT Goal Formulation: Patient unable to participate in  goal setting    Frequency    Min 2X/week      PT Plan Current plan remains appropriate    Co-evaluation              AM-PAC PT "6 Clicks" Mobility   Outcome Measure  Help needed turning from your back to your side while in a flat bed without using bedrails?: None Help needed moving from lying on your back to sitting on the side of a flat bed without using bedrails?: None Help needed moving to and from a bed to a chair (including a wheelchair)?: A Little Help needed standing up from a chair using your arms (e.g., wheelchair or bedside chair)?: A Little Help needed to walk in hospital room?: A Little Help needed climbing 3-5 steps with a railing? : A Little 6 Click Score: 20    End of Session Equipment Utilized During Treatment: Gait belt Activity Tolerance: Patient tolerated treatment well;Other (comment) (cognition) Patient left: in chair;with call bell/phone within reach;with chair alarm set Nurse Communication: Mobility status PT Visit Diagnosis: Unsteadiness on feet (R26.81);Muscle weakness (generalized) (M62.81)     Time: [id number] PT Time Calculation (min) (ACUTE ONLY): 10 min  Charges:  $Therapeutic Exercise: 8-22 mins                     Gwenlyn Saran, PT, DPT Physical Therapist - [facility]  02/27/23, 1:00 PM

## 2023-02-28 DIAGNOSIS — N186 End stage renal disease: Secondary | ICD-10-CM | POA: Diagnosis not present

## 2023-02-28 DIAGNOSIS — I120 Hypertensive chronic kidney disease with stage 5 chronic kidney disease or end stage renal disease: Secondary | ICD-10-CM | POA: Diagnosis not present

## 2023-02-28 LAB — CBC
HCT: 36 % — ABNORMAL LOW (ref 39.0–52.0)
Hemoglobin: 11.6 g/dL — ABNORMAL LOW (ref 13.0–17.0)
MCH: 29 pg (ref 26.0–34.0)
MCHC: 32.2 g/dL (ref 30.0–36.0)
MCV: 90 fL (ref 80.0–100.0)
Platelets: 183 10*3/uL (ref 150–400)
RBC: 4 MIL/uL — ABNORMAL LOW (ref 4.22–5.81)
RDW: 16.4 % — ABNORMAL HIGH (ref 11.5–15.5)
WBC: 4.5 10*3/uL (ref 4.0–10.5)
nRBC: 0 % (ref 0.0–0.2)

## 2023-02-28 LAB — MAGNESIUM: Magnesium: 2.4 mg/dL (ref 1.7–2.4)

## 2023-02-28 LAB — BASIC METABOLIC PANEL
Anion gap: 8 (ref 5–15)
BUN: 89 mg/dL — ABNORMAL HIGH (ref 8–23)
CO2: 25 mmol/L (ref 22–32)
Calcium: 9 mg/dL (ref 8.9–10.3)
Chloride: 107 mmol/L (ref 98–111)
Creatinine, Ser: 4.95 mg/dL — ABNORMAL HIGH (ref 0.61–1.24)
GFR, Estimated: 12 mL/min — ABNORMAL LOW (ref >60)
Glucose, Bld: 116 mg/dL — ABNORMAL HIGH (ref 70–99)
Potassium: 4.7 mmol/L (ref 3.5–5.1)
Sodium: 140 mmol/L (ref 135–145)

## 2023-02-28 LAB — CREATININE CLEARANCE, URINE, 24 HOUR
Collection Interval-CRCL: 24 hours
Creatinine Clearance: 10 mL/min — ABNORMAL LOW (ref 75–125)
Creatinine, 24H Ur: 708 mg/d — ABNORMAL LOW (ref 800–2000)
Creatinine, Urine: 118 mg/dL
Urine Total Volume-CRCL: 600 mL

## 2023-02-28 NOTE — Care Management Important Message (Signed)
Important Message  Patient Details  Name: [name] MRN: [id number] Date of Birth: [birth year]   Medicare Important Message Given:  Yes     Dannette Barbara 02/28/2023, 2:45 PM

## 2023-02-28 NOTE — Progress Notes (Signed)
[facility]  ROUNDING NOTE   Subjective:   [name] was admitted to [facility] on 02/23/2023 for ESRD (end stage renal disease) [N18.6] ESRD (end stage renal disease) on dialysis [N18.6, Z99.2] Psychosis, unspecified psychosis type [F29]  Patient seen resting quietly in bed, alert and oriented to self Partially completed breakfast tray at bedside Patient states he feels well Denies pain or discomfort Remains on room air Lower extremity edema  Creatinine 4.95  Objective:  Vital signs in last 24 hours:  Temp:  [98.2 F (36.8 C)-99.1 F (37.3 C)] 98.2 F (36.8 C) (04/02 0902) Pulse Rate:  [65-73] 73 (04/02 0902) Resp:  [16-20] 20 (04/02 0902) BP: (115-148)/(57-70) 130/69 (04/02 0902) SpO2:  [96 %-99 %] 99 % (04/02 0902) Weight:  [72 kg] 72 kg (04/02 0500)  Weight change: 0 kg Filed Weights   02/26/23 0441 02/27/23 0423 02/28/23 0500  Weight: 72 kg 72 kg 72 kg    Intake/Output: I/O last 3 completed shifts: In: 480 [P.O.:480] Out: 0    Intake/Output this shift:  Total I/O In: 480 [P.O.:480] Out: 400 [Urine:400]  Physical Exam: General: NAD  Head: Normocephalic, atraumatic. Moist oral mucosal membranes  Eyes: Anicteric  Lungs:  Clear to auscultation   Heart: Regular rate and rhythm  Abdomen:  Soft, nontender  Extremities:  no peripheral edema.  Neurologic: Alert to self and place. No insight, moving all four extremities  Skin: No lesions  Access: Right IJ permcath, maturing left AVF +thrill +bruit    Basic Metabolic Panel: Recent Labs  Lab 02/24/23 1244 02/25/23 0401 02/26/23 0353 02/27/23 0620 02/28/23 0459  NA 140 139 138 139 140  K 4.2 4.5 4.4 4.7 4.7  CL 107 103 102 104 107  CO2 26 26 26 25 25   GLUCOSE 122* 131* 112* 124* 116*  BUN 51* 54* 50* 75* 89*  CREATININE 2.84* 3.62* 3.31* 4.46* 4.95*  CALCIUM 8.9 9.6 9.0 9.0 9.0  MG  --   --  2.0 2.2 2.4  PHOS 2.2* 4.3  --   --   --      Liver Function Tests: Recent Labs  Lab  02/24/23 1244 02/25/23 0401  ALBUMIN 3.4* 3.3*    No results for input(s): "LIPASE", "AMYLASE" in the last 168 hours. No results for input(s): "AMMONIA" in the last 168 hours.  CBC: Recent Labs  Lab 02/23/23 2120 02/24/23 1617 02/25/23 0920 02/26/23 0353 02/27/23 0620 02/28/23 0459  WBC 6.4 4.9 5.3 5.1 4.6 4.5  NEUTROABS 3.8 2.7  --   --   --   --   HGB 12.2* 12.5* 12.7* 12.1* 11.9* 11.6*  HCT 39.4 39.7 39.1 37.7* 37.6* 36.0*  MCV 94.5 91.1 89.3 90.2 89.5 90.0  PLT 195 177 184 188 174 183     Cardiac Enzymes: No results for input(s): "CKTOTAL", "CKMB", "CKMBINDEX", "TROPONINI" in the last 168 hours.  BNP: Invalid input(s): "POCBNP"  CBG: Recent Labs  Lab 02/24/23 1751 02/24/23 2055 02/25/23 0931 02/25/23 1732 02/25/23 2111  GLUCAP 195* 103* 117* 150* 160*     Microbiology: Results for orders placed or performed during the hospital encounter of 09/30/22  Resp Panel by RT-PCR (Flu A&B, Covid)     Status: None   Collection Time: 09/30/22 12:06 PM   Specimen: Nasal Swab  Result Value Ref Range Status   SARS Coronavirus 2 by RT PCR NEGATIVE NEGATIVE Final    Comment: (NOTE) SARS-CoV-2 target nucleic acids are NOT DETECTED.  The SARS-CoV-2 RNA is  generally detectable in upper respiratory specimens during the acute phase of infection. The lowest concentration of SARS-CoV-2 viral copies this assay can detect is 138 copies/mL. A negative result does not preclude SARS-Cov-2 infection and should not be used as the sole basis for treatment or other patient management decisions. A negative result may occur with  improper specimen collection/handling, submission of specimen other than nasopharyngeal swab, presence of viral mutation(s) within the areas targeted by this assay, and inadequate number of viral copies(<138 copies/mL). A negative result must be combined with clinical observations, patient history, and epidemiological information. The expected result is  Negative.  Fact Sheet for Patients:  EntrepreneurPulse.com.au  Fact Sheet for Healthcare Providers:  IncredibleEmployment.be  This test is no t yet approved or cleared by the [place] FDA and  has been authorized for detection and/or diagnosis of SARS-CoV-2 by FDA under an Emergency Use Authorization (EUA). This EUA will remain  in effect (meaning this test can be used) for the duration of the COVID-19 declaration under Section 564(b)(1) of the Act, 21 U.S.C.section 360bbb-3(b)(1), unless the authorization is terminated  or revoked sooner.       Influenza A by PCR NEGATIVE NEGATIVE Final   Influenza B by PCR NEGATIVE NEGATIVE Final    Comment: (NOTE) The Xpert Xpress SARS-CoV-2/FLU/RSV plus assay is intended as an aid in the diagnosis of influenza from Nasopharyngeal swab specimens and should not be used as a sole basis for treatment. Nasal washings and aspirates are unacceptable for Xpert Xpress SARS-CoV-2/FLU/RSV testing.  Fact Sheet for Patients: EntrepreneurPulse.com.au  Fact Sheet for Healthcare Providers: IncredibleEmployment.be  This test is not yet approved or cleared by the [place] FDA and has been authorized for detection and/or diagnosis of SARS-CoV-2 by FDA under an Emergency Use Authorization (EUA). This EUA will remain in effect (meaning this test can be used) for the duration of the COVID-19 declaration under Section 564(b)(1) of the Act, 21 U.S.C. section 360bbb-3(b)(1), unless the authorization is terminated or revoked.  Performed at [facility], [address] [place], [place] [address]   Culture, blood (Routine X 2) w Reflex to ID Panel     Status: None   Collection Time: 10/01/22 11:39 AM   Specimen: BLOOD  Result Value Ref Range Status   Specimen Description BLOOD BLOOD RIGHT WRIST  Final   Special Requests   Final    BOTTLES DRAWN AEROBIC AND ANAEROBIC  Blood Culture results may not be optimal due to an inadequate volume of blood received in culture bottles   Culture   Final    NO GROWTH 5 DAYS Performed at [facility], [address] [place], [place] [address]    Report Status 10/06/2022 FINAL  Final  Culture, blood (Routine X 2) w Reflex to ID Panel     Status: None   Collection Time: 10/01/22 11:39 AM   Specimen: BLOOD RIGHT HAND  Result Value Ref Range Status   Specimen Description BLOOD RIGHT HAND  Final   Special Requests   Final    BOTTLES DRAWN AEROBIC AND ANAEROBIC Blood Culture results may not be optimal due to an inadequate volume of blood received in culture bottles   Culture   Final    NO GROWTH 5 DAYS Performed at [facility], [address] [place], [place] [address]    Report Status 10/06/2022 FINAL  Final    Coagulation Studies: No results for input(s): "LABPROT", "INR" in the last 72 hours.  Urinalysis: No results  for input(s): "COLORURINE", "LABSPEC", "PHURINE", "GLUCOSEU", "HGBUR", "BILIRUBINUR", "KETONESUR", "PROTEINUR", "UROBILINOGEN", "NITRITE", "LEUKOCYTESUR" in the last 72 hours.  Invalid input(s): "APPERANCEUR"    Imaging: No results found.   Medications:     amLODipine  5 mg Oral Daily   atorvastatin  20 mg Oral Daily   Chlorhexidine Gluconate Cloth  6 each Topical Q0600   doxazosin  2 mg Oral QHS   finasteride  5 mg Oral Daily   folic acid  1 mg Oral Daily   gabapentin  300 mg Oral Daily   heparin  5,000 Units Subcutaneous Q8H   losartan  100 mg Oral Daily   melatonin  2.5 mg Oral QHS   multivitamin with minerals  1 tablet Oral Daily   OLANZapine  15 mg Oral QHS   thiamine  100 mg Oral Daily   acetaminophen **OR** acetaminophen, docusate sodium, fluticasone, ondansetron **OR** ondansetron (ZOFRAN) IV, polyethylene glycol  Assessment/ Plan:  [name] is a 77 y.o.  male with end stage renal disease on hemodialysis, hypertension, schizophrenia,  diabetes mellitus type II, parathyroid adenoma who is admitted to [facility] on 02/23/2023 for ESRD (end stage renal disease) [N18.6] ESRD (end stage renal disease) on dialysis [N18.6, Z99.2] Psychosis, unspecified psychosis type [F29]  [facility] Right IJ permcath 74kg.   End Stage Renal Disease: Last outpatient hemodialysis treatment was 01/27/23. Restarted dialysis on admission. Tolerated two treatments consecutively.   - 24 hr creatinine clearance resulted at 10, indicating continuation of dialysis. - AVF maturing, continue to use tunneled catheter.  - Will need to adjust outpatient dry weight to 71kg or lower.  -Will schedule patient for dialysis on Wednesday.  Hypertension with chronic kidney disease: Unclear about home medication compliance. Home regimen of clonidine, doxazosin, losartan, and amlodipine. Not on a diuretic at home. Clonidine currently held.  Blood pressure stable for this patient  Anemia of chronic kidney disease: hemoglobin within desired target.  Secondary Hyperparathyroidism: with low PTH of 73 on 01/09/23. Calcium and phosphorus at goal. Patient was previously on calcitriol, calcium carbonate, and sevelamer at home. Will continue to hold these agents.  Diabetes mellitus type II with chronic kidney disease: insulin dependent. Hemoglobin A1c of 6.3% on 12/14/2022.   Primary team will manage SSI   LOS: 4 [place] 4/2/20241:38 PM

## 2023-02-28 NOTE — Progress Notes (Signed)
Physical Therapy Treatment Patient Details Name: [name] MRN: [id number] DOB: 02/27/1946 Today's Date: 02/28/2023   History of Present Illness [name] is a 77 y.o. male with medical history significant of ESRD on HD, uncontrolled type 2 diabetes, hypertension, alcohol use disorder, dementia, schizophrenia, who presented to the ED for psychiatric evaluation after declining to participate in HD for 1 month.  History obtained through chart review due to patient's altered mental status at baseline, and lack of ability to get in touch with group home to obtain PLOF as reccomended by legal guardian.    PT Comments    Pt agreeable to session. Partakes in self care tasks with supervision, no LOB. Pt performs all mobility without any physical assistance, DME, or safety concerns. Pt AMB around unit twice, then returns to room.Pt reports to feel good. Pt up to chair at EOS to allow for clean bed linen change. Will continue to follow.    Recommendations for follow up therapy are one component of a multi-disciplinary discharge planning process, led by the attending physician.  Recommendations may be updated based on patient status, additional functional criteria and insurance authorization.  Follow Up Recommendations       Assistance Recommended at Discharge Frequent or constant Supervision/Assistance  Patient can return home with the following A little help with walking and/or transfers;Assistance with cooking/housework;Assist for transportation;A little help with bathing/dressing/bathroom;Help with stairs or ramp for entrance   Equipment Recommendations  None recommended by PT    Recommendations for Other Services       Precautions / Restrictions Precautions Precautions: Fall Restrictions Weight Bearing Restrictions: No     Mobility  Bed Mobility Overal bed mobility: Independent Bed Mobility: Supine to Sit                Transfers Overall transfer level: Needs  assistance Equipment used: None Transfers: Sit to/from Stand Sit to Stand: Supervision                Ambulation/Gait Ambulation/Gait assistance: Supervision, Min guard Gait Distance (Feet): 400 Feet Assistive device: None         General Gait Details: no device needed, no LOB   Stairs             Wheelchair Mobility    Modified Rankin (Stroke Patients Only)       Balance                                            Cognition Arousal/Alertness: Awake/alert Behavior During Therapy: WFL for tasks assessed/performed Overall Cognitive Status: Within Functional Limits for tasks assessed                                          Exercises Other Exercises Other Exercises: hand hygiene x2 Other Exercises: standing micturation x1 Other Exercises: standing face washing x1    General Comments        Pertinent Vitals/Pain Pain Assessment Pain Assessment: No/denies pain    Home Living                          Prior Function            PT Goals (current goals can now be found in the  care plan section) Acute Rehab PT Goals PT Goal Formulation: Patient unable to participate in goal setting Progress towards PT goals: Progressing toward goals    Frequency    Min 2X/week      PT Plan Current plan remains appropriate    Co-evaluation              AM-PAC PT "6 Clicks" Mobility   Outcome Measure  Help needed turning from your back to your side while in a flat bed without using bedrails?: None Help needed moving from lying on your back to sitting on the side of a flat bed without using bedrails?: None Help needed moving to and from a bed to a chair (including a wheelchair)?: A Little Help needed standing up from a chair using your arms (e.g., wheelchair or bedside chair)?: A Little Help needed to walk in hospital room?: A Little Help needed climbing 3-5 steps with a railing? : A Little 6 Click  Score: 20    End of Session Equipment Utilized During Treatment: Gait belt Activity Tolerance: Patient tolerated treatment well;No increased pain Patient left: in chair;with call bell/phone within reach;with chair alarm set (asked NSG to help with linen change) Nurse Communication: Mobility status PT Visit Diagnosis: Unsteadiness on feet (R26.81);Muscle weakness (generalized) (M62.81)     Time: [id number] PT Time Calculation (min) (ACUTE ONLY): 17 min  Charges:  $Therapeutic Activity: 8-22 mins                    12:20 PM, 02/28/23 Etta Grandchild, PT, DPT Physical Therapist - [facility]  [phone] ([place])    Jakita Dutkiewicz C 02/28/2023, 12:17 PM

## 2023-02-28 NOTE — Progress Notes (Signed)
  PROGRESS NOTE    [name]  [id number] DOB: [birth year] DOA: 02/23/2023 PCP: [facility]  220A/220A-AA  LOS: 4 days   Brief hospital course:   Assessment & Plan: [name] is a 77 y.o. male with medical history significant of ESRD on HD, uncontrolled type 2 diabetes, hypertension, alcohol use disorder, dementia, schizophrenia, who was IVC'ed and brought to the hospital for dialysis.   * ESRD (end stage renal disease) ([place]) Dialysis non-compliance  Patient presenting for emergent dialysis after refusing dialysis for the past 1 month.  Suprisingly, pt was sating well on room air, labs remarkable only for elevated Cr.  CXR clear. --Tolerated two treatments consecutively   - hold dialysis and checked a 24 hour urine creatinine clearance, which had clearance at 10. --pt will need to continue dialysis.  Adjustment disorder with mixed disturbance of emotions and conduct Schizophrenia [facility]) Baseline dementia Patient is presenting after 1 month of refusing dialysis at his group home.  Psychiatry has evaluated and do not feel he meets criteria for IVC or inpatient psychiatric hospitalization.  They have diagnosed him with adjustment disorder.   - Continue home Zyprexa  Type 2 diabetes mellitus (HCC) --A1c 5.9 about 4 months ago --d/c'ed BG checks and SSI  Hypertension --cont amlodipine, Cardura, losartan --hold clonidine   DVT prophylaxis: Heparin SQ Code Status: Full code  Family Communication:  Has legal guardian.   Group home [name]   Level of care: Med-Surg Dispo:   The patient is from: group home Anticipated d/c is to: group home Anticipated d/c date is: medically ready for discharge tomorrow, but there is concern for pt to continue to refuse going to outpatient dialysis.   Subjective and Interval History:  Pt reported doing ok, eagerly awaiting for his lunch.   Objective: Vitals:   02/28/23 0500 02/28/23 0902  02/28/23 1628 02/28/23 1923  BP:  130/69 (!) 101/55 123/88  Pulse:  73 67 68  Resp:  20 20 18   Temp:  98.2 F (36.8 C) 98 F (36.7 C) 98 F (36.7 C)  TempSrc:  Oral Oral   SpO2:  99% 100% 100%  Weight: 72 kg     Height:        Intake/Output Summary (Last 24 hours) at 02/28/2023 2056 Last data filed at 02/28/2023 1927 Gross per 24 hour  Intake 1200 ml  Output 400 ml  Net 800 ml   Filed Weights   02/26/23 0441 02/27/23 0423 02/28/23 0500  Weight: 72 kg 72 kg 72 kg    Examination:   Constitutional: NAD, alert, not oriented, sitting in recliner HEENT: conjunctivae and lids normal, EOMI CV: No cyanosis.   RESP: normal respiratory effort, on RA Extremities: No effusions, edema in BLE SKIN: warm, dry Neuro: II - XII grossly intact.     Data Reviewed: I have personally reviewed labs and imaging studies  Time spent: 35 minutes  Enzo Bi, MD [facility] If 7PM-7AM, please contact night-coverage 02/28/2023, 8:56 PM

## 2023-03-01 DIAGNOSIS — N186 End stage renal disease: Secondary | ICD-10-CM | POA: Diagnosis not present

## 2023-03-01 DIAGNOSIS — I120 Hypertensive chronic kidney disease with stage 5 chronic kidney disease or end stage renal disease: Secondary | ICD-10-CM | POA: Diagnosis not present

## 2023-03-01 LAB — CBC
HCT: 36.3 % — ABNORMAL LOW (ref 39.0–52.0)
Hemoglobin: 11.3 g/dL — ABNORMAL LOW (ref 13.0–17.0)
MCH: 28.4 pg (ref 26.0–34.0)
MCHC: 31.1 g/dL (ref 30.0–36.0)
MCV: 91.2 fL (ref 80.0–100.0)
Platelets: 196 10*3/uL (ref 150–400)
RBC: 3.98 MIL/uL — ABNORMAL LOW (ref 4.22–5.81)
RDW: 16.4 % — ABNORMAL HIGH (ref 11.5–15.5)
WBC: 4.6 10*3/uL (ref 4.0–10.5)
nRBC: 0 % (ref 0.0–0.2)

## 2023-03-01 LAB — MAGNESIUM: Magnesium: 2.6 mg/dL — ABNORMAL HIGH (ref 1.7–2.4)

## 2023-03-01 LAB — BASIC METABOLIC PANEL
Anion gap: 10 (ref 5–15)
BUN: 98 mg/dL — ABNORMAL HIGH (ref 8–23)
CO2: 24 mmol/L (ref 22–32)
Calcium: 9 mg/dL (ref 8.9–10.3)
Chloride: 104 mmol/L (ref 98–111)
Creatinine, Ser: 5.21 mg/dL — ABNORMAL HIGH (ref 0.61–1.24)
GFR, Estimated: 11 mL/min — ABNORMAL LOW (ref >60)
Glucose, Bld: 132 mg/dL — ABNORMAL HIGH (ref 70–99)
Potassium: 4.7 mmol/L (ref 3.5–5.1)
Sodium: 138 mmol/L (ref 135–145)

## 2023-03-01 NOTE — Progress Notes (Signed)
[facility]  ROUNDING NOTE   Subjective:   [name] was admitted to [facility] on 02/23/2023 for ESRD (end stage renal disease) [N18.6] ESRD (end stage renal disease) on dialysis [N18.6, Z99.2] Psychosis, unspecified psychosis type [F29]  Patient seen and evaluated during dialysis   HEMODIALYSIS FLOWSHEET:  Blood Flow Rate (mL/min): 300 mL/min Arterial Pressure (mmHg): -130 mmHg Venous Pressure (mmHg): 210 mmHg TMP (mmHg): 0 mmHg Ultrafiltration Rate (mL/min): 267 mL/min Dialysate Flow Rate (mL/min): 300 ml/min Dialysis Fluid Bolus: Normal Saline Bolus Amount (mL): 100 mL  Tolerating treatment well   Objective:  Vital signs in last 24 hours:  Temp:  [97.5 F (36.4 C)-98.4 F (36.9 C)] 97.5 F (36.4 C) (04/03 1032) Pulse Rate:  [63-71] 68 (04/03 1032) Resp:  [11-20] 19 (04/03 1032) BP: (101-149)/(55-88) 131/74 (04/03 0930) SpO2:  [99 %-100 %] 99 % (04/03 1032) Weight:  [70.5 kg-72.6 kg] 70.5 kg (04/03 1033)  Weight change: 0.6 kg Filed Weights   03/01/23 0628 03/01/23 0730 03/01/23 1033  Weight: 72.6 kg 70.5 kg 70.5 kg    Intake/Output: I/O last 3 completed shifts: In: 1200 [P.O.:1200] Out: 400 [Urine:400]   Intake/Output this shift:  No intake/output data recorded.  Physical Exam: General: NAD  Head: Normocephalic, atraumatic. Moist oral mucosal membranes  Eyes: Anicteric  Lungs:  Clear to auscultation   Heart: Regular rate and rhythm  Abdomen:  Soft, nontender  Extremities:  no peripheral edema.  Neurologic: Alert to self and place. No insight, moving all four extremities  Skin: No lesions  Access: Right IJ permcath, maturing left AVF +thrill +bruit    Basic Metabolic Panel: Recent Labs  Lab 02/24/23 1244 02/25/23 0401 02/26/23 0353 02/27/23 0620 02/28/23 0459 03/01/23 0534  NA 140 139 138 139 140 138  K 4.2 4.5 4.4 4.7 4.7 4.7  CL 107 103 102 104 107 104  CO2 26 26 26 25 25 24   GLUCOSE 122* 131* 112* 124* 116* 132*   BUN 51* 54* 50* 75* 89* 98*  CREATININE 2.84* 3.62* 3.31* 4.46* 4.95* 5.21*  CALCIUM 8.9 9.6 9.0 9.0 9.0 9.0  MG  --   --  2.0 2.2 2.4 2.6*  PHOS 2.2* 4.3  --   --   --   --      Liver Function Tests: Recent Labs  Lab 02/24/23 1244 02/25/23 0401  ALBUMIN 3.4* 3.3*    No results for input(s): "LIPASE", "AMYLASE" in the last 168 hours. No results for input(s): "AMMONIA" in the last 168 hours.  CBC: Recent Labs  Lab 02/23/23 2120 02/24/23 1617 02/25/23 0920 02/26/23 0353 02/27/23 0620 02/28/23 0459 03/01/23 0534  WBC 6.4 4.9 5.3 5.1 4.6 4.5 4.6  NEUTROABS 3.8 2.7  --   --   --   --   --   HGB 12.2* 12.5* 12.7* 12.1* 11.9* 11.6* 11.3*  HCT 39.4 39.7 39.1 37.7* 37.6* 36.0* 36.3*  MCV 94.5 91.1 89.3 90.2 89.5 90.0 91.2  PLT 195 177 184 188 174 183 196     Cardiac Enzymes: No results for input(s): "CKTOTAL", "CKMB", "CKMBINDEX", "TROPONINI" in the last 168 hours.  BNP: Invalid input(s): "POCBNP"  CBG: Recent Labs  Lab 02/24/23 1751 02/24/23 2055 02/25/23 0931 02/25/23 1732 02/25/23 2111  GLUCAP 195* 103* 117* 150* 160*     Microbiology: Results for orders placed or performed during the hospital encounter of 09/30/22  Resp Panel by RT-PCR (Flu A&B, Covid)     Status: None   Collection Time:  09/30/22 12:06 PM   Specimen: Nasal Swab  Result Value Ref Range Status   SARS Coronavirus 2 by RT PCR NEGATIVE NEGATIVE Final    Comment: (NOTE) SARS-CoV-2 target nucleic acids are NOT DETECTED.  The SARS-CoV-2 RNA is generally detectable in upper respiratory specimens during the acute phase of infection. The lowest concentration of SARS-CoV-2 viral copies this assay can detect is 138 copies/mL. A negative result does not preclude SARS-Cov-2 infection and should not be used as the sole basis for treatment or other patient management decisions. A negative result may occur with  improper specimen collection/handling, submission of specimen other than nasopharyngeal  swab, presence of viral mutation(s) within the areas targeted by this assay, and inadequate number of viral copies(<138 copies/mL). A negative result must be combined with clinical observations, patient history, and epidemiological information. The expected result is Negative.  Fact Sheet for Patients:  EntrepreneurPulse.com.au  Fact Sheet for Healthcare Providers:  IncredibleEmployment.be  This test is no t yet approved or cleared by the [place] FDA and  has been authorized for detection and/or diagnosis of SARS-CoV-2 by FDA under an Emergency Use Authorization (EUA). This EUA will remain  in effect (meaning this test can be used) for the duration of the COVID-19 declaration under Section 564(b)(1) of the Act, 21 U.S.C.section 360bbb-3(b)(1), unless the authorization is terminated  or revoked sooner.       Influenza A by PCR NEGATIVE NEGATIVE Final   Influenza B by PCR NEGATIVE NEGATIVE Final    Comment: (NOTE) The Xpert Xpress SARS-CoV-2/FLU/RSV plus assay is intended as an aid in the diagnosis of influenza from Nasopharyngeal swab specimens and should not be used as a sole basis for treatment. Nasal washings and aspirates are unacceptable for Xpert Xpress SARS-CoV-2/FLU/RSV testing.  Fact Sheet for Patients: EntrepreneurPulse.com.au  Fact Sheet for Healthcare Providers: IncredibleEmployment.be  This test is not yet approved or cleared by the [place] FDA and has been authorized for detection and/or diagnosis of SARS-CoV-2 by FDA under an Emergency Use Authorization (EUA). This EUA will remain in effect (meaning this test can be used) for the duration of the COVID-19 declaration under Section 564(b)(1) of the Act, 21 U.S.C. section 360bbb-3(b)(1), unless the authorization is terminated or revoked.  Performed at [facility], [address] [place], [place] [address]   Culture,  blood (Routine X 2) w Reflex to ID Panel     Status: None   Collection Time: 10/01/22 11:39 AM   Specimen: BLOOD  Result Value Ref Range Status   Specimen Description BLOOD BLOOD RIGHT WRIST  Final   Special Requests   Final    BOTTLES DRAWN AEROBIC AND ANAEROBIC Blood Culture results may not be optimal due to an inadequate volume of blood received in culture bottles   Culture   Final    NO GROWTH 5 DAYS Performed at [facility], [address] [place], [place] [address]    Report Status 10/06/2022 FINAL  Final  Culture, blood (Routine X 2) w Reflex to ID Panel     Status: None   Collection Time: 10/01/22 11:39 AM   Specimen: BLOOD RIGHT HAND  Result Value Ref Range Status   Specimen Description BLOOD RIGHT HAND  Final   Special Requests   Final    BOTTLES DRAWN AEROBIC AND ANAEROBIC Blood Culture results may not be optimal due to an inadequate volume of blood received in culture bottles   Culture   Final    NO GROWTH 5  DAYS Performed at [facility], [address] [place], [place] [address]    Report Status 10/06/2022 FINAL  Final    Coagulation Studies: No results for input(s): "LABPROT", "INR" in the last 72 hours.  Urinalysis: No results for input(s): "COLORURINE", "LABSPEC", "PHURINE", "GLUCOSEU", "HGBUR", "BILIRUBINUR", "KETONESUR", "PROTEINUR", "UROBILINOGEN", "NITRITE", "LEUKOCYTESUR" in the last 72 hours.  Invalid input(s): "APPERANCEUR"    Imaging: No results found.   Medications:     amLODipine  5 mg Oral Daily   atorvastatin  20 mg Oral Daily   Chlorhexidine Gluconate Cloth  6 each Topical Q0600   doxazosin  2 mg Oral QHS   finasteride  5 mg Oral Daily   folic acid  1 mg Oral Daily   gabapentin  300 mg Oral Daily   heparin  5,000 Units Subcutaneous Q8H   losartan  100 mg Oral Daily   melatonin  2.5 mg Oral QHS   multivitamin with minerals  1 tablet Oral Daily   OLANZapine  15 mg Oral QHS   thiamine  100 mg Oral Daily    acetaminophen **OR** acetaminophen, docusate sodium, fluticasone, ondansetron **OR** ondansetron (ZOFRAN) IV, polyethylene glycol  Assessment/ Plan:  [name] is a 77 y.o.  male with end stage renal disease on hemodialysis, hypertension, schizophrenia, diabetes mellitus type II, parathyroid adenoma who is admitted to [facility] on 02/23/2023 for ESRD (end stage renal disease) [N18.6] ESRD (end stage renal disease) on dialysis [N18.6, Z99.2] Psychosis, unspecified psychosis type [F29]  [facility] Right IJ permcath 74kg.   End Stage Renal Disease: Last outpatient hemodialysis treatment was 01/27/23. Restarted dialysis on admission. Tolerated two treatments consecutively.   - 24 hr creatinine clearance resulted at 10, indicating continuation of dialysis. - AVF maturing, continue to use tunneled catheter.  - Will need to adjust outpatient dry weight to 71kg or lower.  -Dialysis today, no UF. Treatment terminated early due to clotting issues.  -Next treatment scheduled for Friday  Hypertension with chronic kidney disease: Unclear about home medication compliance. Home regimen of clonidine, doxazosin, losartan, and amlodipine. Not on a diuretic at home. Clonidine currently held.  Blood pressure stable for this patient  Anemia of chronic kidney disease: hemoglobin within desired target.  Secondary Hyperparathyroidism: with low PTH of 73 on 01/09/23. Calcium and phosphorus at goal. Patient was previously on calcitriol, calcium carbonate, and sevelamer at home. Will continue to hold these agents.  Diabetes mellitus type II with chronic kidney disease: insulin dependent. Hemoglobin A1c of 6.3% on 12/14/2022.   Primary team will manage SSI   LOS: 5 [place] 4/3/202412:08 PM

## 2023-03-01 NOTE — Progress Notes (Signed)
Hemodialysis Note  Received patient in bed to unit. Alert and oriented. Informed consent signed and in chart.   Treatment initiated:0735 Treatment completed:1000  Patient tolerated treatment well. Transported back to the room alert, without acute distress. Report given to patient's RN.  Access used: RIJ Catheter  Access issues:Unable to lock arterial lumen with heparin.   Total UF removed:0 kg  Medications given:Heparin  Post HD VS:131/73, 69, 16, 97.5 Post HD weight:70.5 kg  Forrest Moron, RN [facility]

## 2023-03-01 NOTE — Progress Notes (Signed)
Occupational Therapy Treatment Patient Details Name: [name] MRN: [id number] DOB: 1946/04/27 Today's Date: 03/01/2023   History of present illness [name] is a 77 y.o. male with medical history significant of ESRD on HD, uncontrolled type 2 diabetes, hypertension, alcohol use disorder, dementia, schizophrenia, who presented to the ED for psychiatric evaluation after declining to participate in HD for 1 month.  History obtained through chart review due to patient's altered mental status at baseline, and lack of ability to get in touch with group home to obtain PLOF as reccomended by legal guardian.   OT comments  Upon entering the room, pt supine in bed and agreeable to OT intervention. Pt performs bed mobility without assistance. Pt stands with supervision and ambulates to bathroom with close supervision. Pt demonstrates toilet transfer and clothing management with supervision overall. Pt stands at sink for hand hygiene before returning back to bed. Min cuing for safety awareness during session and pt follows 1 step simple commands. Pt requesting snack and RN agreed. Pt able to open containers and packages without issue. Bed alarm activated and call bell within reach upon exiting the room.    Recommendations for follow up therapy are one component of a multi-disciplinary discharge planning process, led by the attending physician.  Recommendations may be updated based on patient status, additional functional criteria and insurance authorization.    Assistance Recommended at Discharge Frequent or constant Supervision/Assistance  Patient can return home with the following  A little help with walking and/or transfers;A little help with bathing/dressing/bathroom;Assistance with cooking/housework;Assist for transportation;Help with stairs or ramp for entrance;Direct supervision/assist for financial management;Direct supervision/assist for medications management   Equipment  Recommendations  None recommended by OT       Precautions / Restrictions Precautions Precautions: Fall       Mobility Bed Mobility Overal bed mobility: Modified Independent                  Transfers Overall transfer level: Needs assistance Equipment used: None Transfers: Sit to/from Stand Sit to Stand: Supervision                 Balance Overall balance assessment: Needs assistance   Sitting balance-Leahy Scale: Normal     Standing balance support: No upper extremity supported, During functional activity Standing balance-Leahy Scale: Good                             ADL either performed or assessed with clinical judgement   ADL Overall ADL's : Needs assistance/impaired     Grooming: Standing;Wash/dry hands;Supervision/safety                   Toilet Transfer: Supervision/safety;Regular Toilet;Grab bars   Toileting- Clothing Manipulation and Hygiene: Supervision/safety;Sit to/from stand       Functional mobility during ADLs: Supervision/safety      Extremity/Trunk Assessment Upper Extremity Assessment Upper Extremity Assessment: Overall WFL for tasks assessed   Lower Extremity Assessment Lower Extremity Assessment: Overall WFL for tasks assessed        Vision Patient Visual Report: No change from baseline            Cognition Arousal/Alertness: Awake/alert Behavior During Therapy: WFL for tasks assessed/performed Overall Cognitive Status: History of cognitive impairments - at baseline  General Comments: Pt is very pleasant and cooperative. Baseline dementia and schizophrenia but follows 1 step commands with increased time.                   Pertinent Vitals/ Pain       Pain Assessment Pain Assessment: No/denies pain         Frequency  Min 1X/week        Progress Toward Goals  OT Goals(current goals can now be found in the care plan section)   Progress towards OT goals: Progressing toward goals     Plan Discharge plan remains appropriate;Frequency needs to be updated       AM-PAC OT "6 Clicks" Daily Activity     Outcome Measure   Help from another person eating meals?: None Help from another person taking care of personal grooming?: None Help from another person toileting, which includes using toliet, bedpan, or urinal?: A Little Help from another person bathing (including washing, rinsing, drying)?: A Little Help from another person to put on and taking off regular upper body clothing?: None Help from another person to put on and taking off regular lower body clothing?: A Little 6 Click Score: 21    End of Session Equipment Utilized During Treatment: Rolling walker (2 wheels)  OT Visit Diagnosis: Unsteadiness on feet (R26.81);Muscle weakness (generalized) (M62.81);Other symptoms and signs involving cognitive function   Activity Tolerance Patient tolerated treatment well   Patient Left in bed;with bed alarm set;with call bell/phone within reach   Nurse Communication Mobility status        Time: [id number] OT Time Calculation (min): 18 min  Charges: OT General Charges $OT Visit: 1 Visit OT Treatments $Self Care/Home Management : 8-22 mins  Darleen Crocker, MS, OTR/L , CBIS ascom [phone]  03/01/23, 2:48 PM

## 2023-03-01 NOTE — Progress Notes (Signed)
  PROGRESS NOTE    [name]  [id number] DOB: 03/18/1946 DOA: 02/23/2023 PCP: [facility]  [id number]  LOS: 5 days   Brief hospital course:   Assessment & Plan: [name] is a 77 y.o. male with medical history significant of ESRD on HD, uncontrolled type 2 diabetes, hypertension, alcohol use disorder, dementia, schizophrenia, who was IVC'ed and brought to the hospital for dialysis.   * ESRD (end stage renal disease) ([place]) Dialysis non-compliance  Patient presenting for emergent dialysis after refusing dialysis for the past 1 month.  Suprisingly, pt was sating well on room air, labs remarkable only for elevated Cr.  CXR clear. --Tolerated two treatments consecutively   - hold dialysis and checked a 24 hour urine creatinine clearance, which had clearance at 10. --pt will need to continue dialysis.  Adjustment disorder with mixed disturbance of emotions and conduct Schizophrenia [facility]) Baseline dementia Patient is presenting after 1 month of refusing dialysis at his group home.  Psychiatry has evaluated and do not feel he meets criteria for IVC or inpatient psychiatric hospitalization.  They have diagnosed him with adjustment disorder.   - Continue home Zyprexa  Type 2 diabetes mellitus (HCC) --A1c 5.9 about 4 months ago --d/c'ed BG checks and SSI  Hypertension --cont amlodipine, Cardura, losartan --hold clonidine   DVT prophylaxis: Heparin SQ Code Status: Full code  Family Communication:  Has legal guardian.   Group home [id number] [name]   Level of care: Med-Surg Dispo:   The patient is from: group home Anticipated d/c is to: group home Anticipated d/c date is: medically ready for discharge tomorrow, but there is concern for pt to continue to refuse going to outpatient dialysis.   Subjective and Interval History:  Pt in recliner eating breakfast, seen after dialysis this AM.  He reports feeling well and states "I'm just  trying to get out of here".  Otherwise no complaints or acute events reported.   Objective: Vitals:   03/01/23 1020 03/01/23 1032 03/01/23 1033 03/01/23 1053  BP:    (!) 121/92  Pulse: 70 68  66  Resp: 15 19  17   Temp:  (!) 97.5 F (36.4 C)  98.1 F (36.7 C)  TempSrc:  Oral  Oral  SpO2: 100% 99%  98%  Weight:   70.5 kg   Height:        Intake/Output Summary (Last 24 hours) at 03/01/2023 1544 Last data filed at 03/01/2023 1428 Gross per 24 hour  Intake 480 ml  Output --  Net 480 ml   Filed Weights   03/01/23 0628 03/01/23 0730 03/01/23 1033  Weight: 72.6 kg 70.5 kg 70.5 kg    Examination:   General exam: awake, alert, no acute distress HEENT: atraumatic, clear conjunctiva, anicteric sclera, moist mucus membranes, hearing grossly normal  Respiratory system: CTAB, no wheezes, rales or rhonchi, normal respiratory effort. Cardiovascular system: normal S1/S2, RRR, dialysis catheter in right upper chest wall  Gastrointestinal system: soft, NT, ND, no HSM felt, +bowel sounds. Central nervous system: alert, oriented to self and place at least, no gross focal neurologic deficits, normal speech Extremities: moves all , no edema, normal tone Skin: dry, intact, normal temperature Psychiatry: normal mood, congruent affect, judgement and insight appear normal   Data Reviewed: I have personally reviewed labs and imaging studies  Time spent: 35 minutes  Ezekiel Slocumb, DO [facility] If 7PM-7AM, please contact night-coverage 03/01/2023, 3:44 PM

## 2023-03-02 DIAGNOSIS — I120 Hypertensive chronic kidney disease with stage 5 chronic kidney disease or end stage renal disease: Secondary | ICD-10-CM | POA: Diagnosis not present

## 2023-03-02 DIAGNOSIS — N186 End stage renal disease: Secondary | ICD-10-CM | POA: Diagnosis not present

## 2023-03-02 LAB — RENAL FUNCTION PANEL
Albumin: 3.1 g/dL — ABNORMAL LOW (ref 3.5–5.0)
Anion gap: 8 (ref 5–15)
BUN: 74 mg/dL — ABNORMAL HIGH (ref 8–23)
CO2: 27 mmol/L (ref 22–32)
Calcium: 8.8 mg/dL — ABNORMAL LOW (ref 8.9–10.3)
Chloride: 104 mmol/L (ref 98–111)
Creatinine, Ser: 4.4 mg/dL — ABNORMAL HIGH (ref 0.61–1.24)
GFR, Estimated: 13 mL/min — ABNORMAL LOW (ref >60)
Glucose, Bld: 114 mg/dL — ABNORMAL HIGH (ref 70–99)
Phosphorus: 3.5 mg/dL (ref 2.5–4.6)
Potassium: 4.3 mmol/L (ref 3.5–5.1)
Sodium: 139 mmol/L (ref 135–145)

## 2023-03-02 MED ORDER — DOCUSATE SODIUM 100 MG PO CAPS
100.0000 mg | ORAL_CAPSULE | Freq: Two times a day (BID) | ORAL | Status: AC | PRN
Start: 2023-03-02 — End: ?

## 2023-03-02 MED ORDER — MELATONIN TR 1 MG PO TBCR
2.0000 | EXTENDED_RELEASE_TABLET | Freq: Every day | ORAL | 0 refills | Status: AC
Start: 2023-03-02 — End: ?

## 2023-03-02 MED ORDER — HEPARIN SODIUM (PORCINE) 1000 UNIT/ML IJ SOLN
INTRAMUSCULAR | Status: AC
  Filled 2023-03-02: qty 10

## 2023-03-02 NOTE — Progress Notes (Signed)
[facility]  ROUNDING NOTE   Subjective:   [name] was admitted to [facility] on 02/23/2023 for ESRD (end stage renal disease) [N18.6] ESRD (end stage renal disease) on dialysis [N18.6, Z99.2] Psychosis, unspecified psychosis type [F29]  Patient seen sitting up in bed Alert, tolerating small meals Denies pain  Room air No lower extremity edema  Objective:  Vital signs in last 24 hours:  Temp:  [97.7 F (36.5 C)-98.3 F (36.8 C)] 98.1 F (36.7 C) (04/04 0823) Pulse Rate:  [57-69] 64 (04/04 0823) Resp:  [16-20] 16 (04/04 0823) BP: (102-132)/(36-71) 132/71 (04/04 0823) SpO2:  [97 %-99 %] 98 % (04/04 0823) Weight:  [73.3 kg] 73.3 kg (04/04 0500)  Weight change: -2.1 kg Filed Weights   03/01/23 0730 03/01/23 1033 03/02/23 0500  Weight: 70.5 kg 70.5 kg 73.3 kg    Intake/Output: I/O last 3 completed shifts: In: 600 [P.O.:600] Out: -    Intake/Output this shift:  No intake/output data recorded.  Physical Exam: General: NAD  Head: Normocephalic, atraumatic. Moist oral mucosal membranes  Eyes: Anicteric  Lungs:  Clear to auscultation   Heart: Regular rate and rhythm  Abdomen:  Soft, nontender  Extremities:  no peripheral edema.  Neurologic: Alert to self and place. No insight, moving all four extremities  Skin: No lesions  Access: Right IJ permcath, maturing left AVF +thrill +bruit    Basic Metabolic Panel: Recent Labs  Lab 02/24/23 1244 02/25/23 0401 02/26/23 0353 02/27/23 0620 02/28/23 0459 03/01/23 0534 03/02/23 0526  NA 140 139 138 139 140 138 139  K 4.2 4.5 4.4 4.7 4.7 4.7 4.3  CL 107 103 102 104 107 104 104  CO2 26 26 26 25 25 24 27   GLUCOSE 122* 131* 112* 124* 116* 132* 114*  BUN 51* 54* 50* 75* 89* 98* 74*  CREATININE 2.84* 3.62* 3.31* 4.46* 4.95* 5.21* 4.40*  CALCIUM 8.9 9.6 9.0 9.0 9.0 9.0 8.8*  MG  --   --  2.0 2.2 2.4 2.6*  --   PHOS 2.2* 4.3  --   --   --   --  3.5     Liver Function Tests: Recent Labs  Lab  02/24/23 1244 02/25/23 0401 03/02/23 0526  ALBUMIN 3.4* 3.3* 3.1*    No results for input(s): "LIPASE", "AMYLASE" in the last 168 hours. No results for input(s): "AMMONIA" in the last 168 hours.  CBC: Recent Labs  Lab 02/23/23 2120 02/24/23 1617 02/25/23 0920 02/26/23 0353 02/27/23 0620 02/28/23 0459 03/01/23 0534  WBC 6.4 4.9 5.3 5.1 4.6 4.5 4.6  NEUTROABS 3.8 2.7  --   --   --   --   --   HGB 12.2* 12.5* 12.7* 12.1* 11.9* 11.6* 11.3*  HCT 39.4 39.7 39.1 37.7* 37.6* 36.0* 36.3*  MCV 94.5 91.1 89.3 90.2 89.5 90.0 91.2  PLT 195 177 184 188 174 183 196     Cardiac Enzymes: No results for input(s): "CKTOTAL", "CKMB", "CKMBINDEX", "TROPONINI" in the last 168 hours.  BNP: Invalid input(s): "POCBNP"  CBG: Recent Labs  Lab 02/24/23 1751 02/24/23 2055 02/25/23 0931 02/25/23 1732 02/25/23 2111  GLUCAP 195* 103* 117* 150* 160*     Microbiology: Results for orders placed or performed during the hospital encounter of 09/30/22  Resp Panel by RT-PCR (Flu A&B, Covid)     Status: None   Collection Time: 09/30/22 12:06 PM   Specimen: Nasal Swab  Result Value Ref Range Status   SARS Coronavirus 2 by RT PCR  NEGATIVE NEGATIVE Final    Comment: (NOTE) SARS-CoV-2 target nucleic acids are NOT DETECTED.  The SARS-CoV-2 RNA is generally detectable in upper respiratory specimens during the acute phase of infection. The lowest concentration of SARS-CoV-2 viral copies this assay can detect is 138 copies/mL. A negative result does not preclude SARS-Cov-2 infection and should not be used as the sole basis for treatment or other patient management decisions. A negative result may occur with  improper specimen collection/handling, submission of specimen other than nasopharyngeal swab, presence of viral mutation(s) within the areas targeted by this assay, and inadequate number of viral copies(<138 copies/mL). A negative result must be combined with clinical observations, patient  history, and epidemiological information. The expected result is Negative.  Fact Sheet for Patients:  EntrepreneurPulse.com.au  Fact Sheet for Healthcare Providers:  IncredibleEmployment.be  This test is no t yet approved or cleared by the [place] FDA and  has been authorized for detection and/or diagnosis of SARS-CoV-2 by FDA under an Emergency Use Authorization (EUA). This EUA will remain  in effect (meaning this test can be used) for the duration of the COVID-19 declaration under Section 564(b)(1) of the Act, 21 U.S.C.section 360bbb-3(b)(1), unless the authorization is terminated  or revoked sooner.       Influenza A by PCR NEGATIVE NEGATIVE Final   Influenza B by PCR NEGATIVE NEGATIVE Final    Comment: (NOTE) The Xpert Xpress SARS-CoV-2/FLU/RSV plus assay is intended as an aid in the diagnosis of influenza from Nasopharyngeal swab specimens and should not be used as a sole basis for treatment. Nasal washings and aspirates are unacceptable for Xpert Xpress SARS-CoV-2/FLU/RSV testing.  Fact Sheet for Patients: EntrepreneurPulse.com.au  Fact Sheet for Healthcare Providers: IncredibleEmployment.be  This test is not yet approved or cleared by the [place] FDA and has been authorized for detection and/or diagnosis of SARS-CoV-2 by FDA under an Emergency Use Authorization (EUA). This EUA will remain in effect (meaning this test can be used) for the duration of the COVID-19 declaration under Section 564(b)(1) of the Act, 21 U.S.C. section 360bbb-3(b)(1), unless the authorization is terminated or revoked.  Performed at [facility], [address] [place], [place] [address]   Culture, blood (Routine X 2) w Reflex to ID Panel     Status: None   Collection Time: 10/01/22 11:39 AM   Specimen: BLOOD  Result Value Ref Range Status   Specimen Description BLOOD BLOOD RIGHT WRIST  Final    Special Requests   Final    BOTTLES DRAWN AEROBIC AND ANAEROBIC Blood Culture results may not be optimal due to an inadequate volume of blood received in culture bottles   Culture   Final    NO GROWTH 5 DAYS Performed at [facility], [address] [place], [place] [address]    Report Status 10/06/2022 FINAL  Final  Culture, blood (Routine X 2) w Reflex to ID Panel     Status: None   Collection Time: 10/01/22 11:39 AM   Specimen: BLOOD RIGHT HAND  Result Value Ref Range Status   Specimen Description BLOOD RIGHT HAND  Final   Special Requests   Final    BOTTLES DRAWN AEROBIC AND ANAEROBIC Blood Culture results may not be optimal due to an inadequate volume of blood received in culture bottles   Culture   Final    NO GROWTH 5 DAYS Performed at [facility], [address], [place], [place] [address]    Report Status 10/06/2022 FINAL  Final  Coagulation Studies: No results for input(s): "LABPROT", "INR" in the last 72 hours.  Urinalysis: No results for input(s): "COLORURINE", "LABSPEC", "PHURINE", "GLUCOSEU", "HGBUR", "BILIRUBINUR", "KETONESUR", "PROTEINUR", "UROBILINOGEN", "NITRITE", "LEUKOCYTESUR" in the last 72 hours.  Invalid input(s): "APPERANCEUR"    Imaging: No results found.   Medications:     amLODipine  5 mg Oral Daily   atorvastatin  20 mg Oral Daily   Chlorhexidine Gluconate Cloth  6 each Topical Q0600   doxazosin  2 mg Oral QHS   finasteride  5 mg Oral Daily   folic acid  1 mg Oral Daily   gabapentin  300 mg Oral Daily   heparin  5,000 Units Subcutaneous Q8H   losartan  100 mg Oral Daily   melatonin  2.5 mg Oral QHS   multivitamin with minerals  1 tablet Oral Daily   OLANZapine  15 mg Oral QHS   thiamine  100 mg Oral Daily   acetaminophen **OR** acetaminophen, docusate sodium, fluticasone, ondansetron **OR** ondansetron (ZOFRAN) IV, polyethylene glycol  Assessment/ Plan:  [name] is a 77 y.o.  male with end stage  renal disease on hemodialysis, hypertension, schizophrenia, diabetes mellitus type II, parathyroid adenoma who is admitted to [facility] on 02/23/2023 for ESRD (end stage renal disease) [N18.6] ESRD (end stage renal disease) on dialysis [N18.6, Z99.2] Psychosis, unspecified psychosis type [F29]  [facility] Right IJ permcath 74kg.   End Stage Renal Disease: Last outpatient hemodialysis treatment was 01/27/23. Restarted dialysis on admission. Tolerated two treatments consecutively.   - 24 hr creatinine clearance resulted at 10, indicating continuation of dialysis. - AVF maturing, continue to use tunneled catheter.  - Dialysis received yesterday, UF 0.  - Next treatment scheduled for Friday.  - Renal navigator to confirm outpatient clinic, once confirmed. Patient cleared to discharge from renal stance.   Hypertension with chronic kidney disease: Unclear about home medication compliance. Home regimen of clonidine, doxazosin, losartan, and amlodipine. Not on a diuretic at home. Clonidine currently held.  Blood pressure acceptable  Anemia of chronic kidney disease: hemoglobin remains stable. No need for ESA's  Secondary Hyperparathyroidism: with low PTH of 73 on 01/09/23. Calcium and phosphorus remain at goal. Patient was previously on calcitriol, calcium carbonate, and sevelamer at home.   Diabetes mellitus type II with chronic kidney disease: insulin dependent. Hemoglobin A1c of 6.3% on 12/14/2022.   Primary team will manage SSI   LOS: 6 [place] 4/4/202412:44 PM

## 2023-03-02 NOTE — Progress Notes (Signed)
Palliative-   Chart reviewed. Patient continues to be stable, tolerating and agreeing to dialysis.   Mariana Kaufman, AGNP-C Palliative Medicine  No charge

## 2023-03-02 NOTE — Progress Notes (Signed)
Pt discharged per MD order. AVS packet sent with pt in taxi back to the group home.

## 2023-03-02 NOTE — Discharge Summary (Addendum)
PLACEMENT RESOLVED: Outpatient Facility: [facility]  [id number] [place], [place] [address] [phone]  Scheduled Days:  Monday Wednesday and Friday  Chair Time: 11:30am

## 2023-03-02 NOTE — Discharge Summary (Signed)
Physician Discharge Summary   Patient: [name] MRN: [id number] DOB: Oct 15, 1946  Admit date:     02/23/2023  Discharge date: 03/02/23  Discharge Physician: Ezekiel Slocumb   PCP: [facility]   Recommendations at discharge:    Follow up with Nephrology and M/W/F dialysis, next 03/03/23 Follow up with Primary Care Follow up with Psychiatry Repeat labs per nephrology in 1 week  Discharge Diagnoses: Principal Problem:   ESRD (end stage renal disease) Active Problems:   Adjustment disorder with mixed disturbance of emotions and conduct   Schizophrenia   Hypertension   Type 2 diabetes mellitus  Resolved Problems:   * No resolved hospital problems. *  Hospital Course:  [name] is a 77 y.o. male with medical history significant of ESRD on HD, uncontrolled type 2 diabetes, hypertension, alcohol use disorder, dementia, schizophrenia, who was IVC'ed and brought to the hospital for dialysis.   Patient was resumed on dialysis and has been agreeable and cooperative.  Nephrology has arranged resumption of his regular outpatient dialysis M/W/F, with next dialysis on Friday 03/03/23.    Assessment and Plan:  * ESRD (end stage renal disease) ([place]) Dialysis non-compliance  Patient presenting for emergent dialysis after refusing dialysis for the past 1 month.  Pt was not hypoxic.  Labs remarkable only for elevated Cr.  CXR clear. --Tolerated dialysis treatments while inpatient --Nephrology consulted --Outpatient dialysis chair time is secured --next dialysis 03/03/23   Adjustment disorder with mixed disturbance of emotions and conduct Schizophrenia [facility]) Baseline dementia Patient is presenting after 1 month of refusing dialysis at his group home.  Psychiatry has evaluated and do not feel he meets criteria for IVC or inpatient psychiatric hospitalization.  They have diagnosed him with adjustment disorder.   - Continue home Zyprexa   Type 2 diabetes  mellitus (HCC) --A1c 5.9 about 4 months ago --d/c'ed BG checks and SSI   Hypertension --cont amlodipine, doxazosin, losartan --hold clonidine - will stop this at d/c to avoid hypotension, resume in follow up if indicated       Consultants: Nephrology Procedures performed: None   Disposition: Group home  Diet recommendation:  Renal / Carb modified  DISCHARGE MEDICATION: Allergies as of 03/02/2023   No Known Allergies      Medication List     STOP taking these medications    calcitRIOL 0.5 MCG capsule Commonly known as: ROCALTROL   cloNIDine 0.1 MG tablet Commonly known as: CATAPRES   ergocalciferol 1.25 MG (50000 UT) capsule Commonly known as: VITAMIN D2   HYDROcodone-acetaminophen 5-325 MG tablet Commonly known as: NORCO/VICODIN   sevelamer carbonate 800 MG tablet Commonly known as: RENVELA       TAKE these medications    amLODipine 5 MG tablet Commonly known as: NORVASC Take 5 mg by mouth daily.   atorvastatin 20 MG tablet Commonly known as: LIPITOR Take 1 tablet (20 mg total) by mouth daily.   denosumab 60 MG/ML Sosy injection Commonly known as: PROLIA Inject 1 mL into the skin every 6 (six) months.   docusate sodium 100 MG capsule Commonly known as: COLACE Take 1 capsule (100 mg total) by mouth 2 (two) times daily as needed.   doxazosin 2 MG tablet Commonly known as: CARDURA Take 2 mg by mouth at bedtime.   finasteride 5 MG tablet Commonly known as: PROSCAR TAKE ONE TABLET BY MOUTH EVERY DAY FOR ENLARGED PROSTATE What changed:  how much to take how to take this when to take  this   fluticasone 50 MCG/ACT nasal spray Commonly known as: FLONASE USE 2 SPRAYS IN EACH NOSTRIL DAILY AS NEEDED What changed:  how much to take how to take this when to take this additional instructions   folic acid 1 MG tablet Commonly known as: FOLVITE Take 1 tablet (1 mg total) by mouth daily.   gabapentin 300 MG capsule Commonly known as:  NEURONTIN Take 300 mg by mouth daily.   glucose blood test strip 1 each by Other route 2 (two) times daily. Use as instructed   lidocaine-prilocaine cream Commonly known as: EMLA Apply 1 Application topically 3 (three) times daily.   losartan 100 MG tablet Commonly known as: COZAAR Take 100 mg by mouth daily.   Melatonin TR 1 MG Tbcr Generic drug: Melatonin ER Take 2 tablets by mouth at bedtime. What changed: how much to take   multivitamin with minerals tablet TAKE 1 TABLET BY MOUTH DAILY   OLANZapine 15 MG tablet Commonly known as: ZYPREXA Take 15 mg by mouth at bedtime.   pioglitazone 30 MG tablet Commonly known as: ACTOS Take 30 mg by mouth daily.   thiamine 100 MG tablet Commonly known as: Vitamin B-1 Take 1 tablet (100 mg total) by mouth daily.   traZODone 100 MG tablet Commonly known as: DESYREL Take 100 mg by mouth at bedtime.   Tyler Aas FlexTouch 100 UNIT/ML FlexTouch Pen Generic drug: insulin degludec Inject 10 Units into the skin at bedtime.   TRUEplus Lancets 26G Misc 1 each by Does not apply route 2 (two) times daily.        Discharge Exam: Filed Weights   03/01/23 0730 03/01/23 1033 03/02/23 0500  Weight: 70.5 kg 70.5 kg 73.3 kg   General exam: awake, alert, no acute distress HEENT: atraumatic, clear conjunctiva, anicteric sclera, moist mucus membranes, hearing grossly normal  Respiratory system: CTAB, no wheezes, rales or rhonchi, normal respiratory effort. Cardiovascular system: normal S1/S2, RRR, dialysis catheter in right upper chest Gastrointestinal system: soft, NT, ND, no HSM felt, +bowel sounds. Central nervous system: A&O x self and hospital at least. no gross focal neurologic deficits, normal speech Extremities: moves all, no edema, normal tone Skin: dry, intact, normal temperature Psychiatry: normal mood, congruent affect   Condition at discharge: stable  The results of significant diagnostics from this hospitalization  (including imaging, microbiology, ancillary and laboratory) are listed below for reference.   Imaging Studies: DG Chest 2 View  Result Date: 02/24/2023 CLINICAL DATA:  Short of breath, end-stage renal disease, diabetes, hypertension EXAM: CHEST - 2 VIEW COMPARISON:  09/30/2022 FINDINGS: Frontal and lateral views of the chest are obtained on 2 images. Stable right internal jugular dialysis catheter. The cardiac silhouette is unremarkable. No acute airspace disease, effusion, or pneumothorax. No acute bony abnormality. IMPRESSION: 1. No acute intrathoracic process. Electronically Signed   By: Randa Ngo M.D.   On: 02/24/2023 18:27    Microbiology: Results for orders placed or performed during the hospital encounter of 09/30/22  Resp Panel by RT-PCR (Flu A&B, Covid)     Status: None   Collection Time: 09/30/22 12:06 PM   Specimen: Nasal Swab  Result Value Ref Range Status   SARS Coronavirus 2 by RT PCR NEGATIVE NEGATIVE Final    Comment: (NOTE) SARS-CoV-2 target nucleic acids are NOT DETECTED.  The SARS-CoV-2 RNA is generally detectable in upper respiratory specimens during the acute phase of infection. The lowest concentration of SARS-CoV-2 viral copies this assay can detect is 138 copies/mL. A negative  result does not preclude SARS-Cov-2 infection and should not be used as the sole basis for treatment or other patient management decisions. A negative result may occur with  improper specimen collection/handling, submission of specimen other than nasopharyngeal swab, presence of viral mutation(s) within the areas targeted by this assay, and inadequate number of viral copies(<138 copies/mL). A negative result must be combined with clinical observations, patient history, and epidemiological information. The expected result is Negative.  Fact Sheet for Patients:  EntrepreneurPulse.com.au  Fact Sheet for Healthcare Providers:   IncredibleEmployment.be  This test is no t yet approved or cleared by the [place] FDA and  has been authorized for detection and/or diagnosis of SARS-CoV-2 by FDA under an Emergency Use Authorization (EUA). This EUA will remain  in effect (meaning this test can be used) for the duration of the COVID-19 declaration under Section 564(b)(1) of the Act, 21 U.S.C.section 360bbb-3(b)(1), unless the authorization is terminated  or revoked sooner.       Influenza A by PCR NEGATIVE NEGATIVE Final   Influenza B by PCR NEGATIVE NEGATIVE Final    Comment: (NOTE) The Xpert Xpress SARS-CoV-2/FLU/RSV plus assay is intended as an aid in the diagnosis of influenza from Nasopharyngeal swab specimens and should not be used as a sole basis for treatment. Nasal washings and aspirates are unacceptable for Xpert Xpress SARS-CoV-2/FLU/RSV testing.  Fact Sheet for Patients: EntrepreneurPulse.com.au  Fact Sheet for Healthcare Providers: IncredibleEmployment.be  This test is not yet approved or cleared by the [place] FDA and has been authorized for detection and/or diagnosis of SARS-CoV-2 by FDA under an Emergency Use Authorization (EUA). This EUA will remain in effect (meaning this test can be used) for the duration of the COVID-19 declaration under Section 564(b)(1) of the Act, 21 U.S.C. section 360bbb-3(b)(1), unless the authorization is terminated or revoked.  Performed at [facility], [address] [place], [place] [address]   Culture, blood (Routine X 2) w Reflex to ID Panel     Status: None   Collection Time: 10/01/22 11:39 AM   Specimen: BLOOD  Result Value Ref Range Status   Specimen Description BLOOD BLOOD RIGHT WRIST  Final   Special Requests   Final    BOTTLES DRAWN AEROBIC AND ANAEROBIC Blood Culture results may not be optimal due to an inadequate volume of blood received in culture bottles   Culture    Final    NO GROWTH 5 DAYS Performed at [facility], [address] [place], [place] [address]    Report Status 10/06/2022 FINAL  Final  Culture, blood (Routine X 2) w Reflex to ID Panel     Status: None   Collection Time: 10/01/22 11:39 AM   Specimen: BLOOD RIGHT HAND  Result Value Ref Range Status   Specimen Description BLOOD RIGHT HAND  Final   Special Requests   Final    BOTTLES DRAWN AEROBIC AND ANAEROBIC Blood Culture results may not be optimal due to an inadequate volume of blood received in culture bottles   Culture   Final    NO GROWTH 5 DAYS Performed at [facility], [address] [place],  [address]    Report Status 10/06/2022 FINAL  Final    Labs: CBC: Recent Labs  Lab 02/23/23 2120 02/24/23 1617 02/25/23 0920 02/26/23 0353 02/27/23 0620 02/28/23 0459 03/01/23 0534  WBC 6.4 4.9 5.3 5.1 4.6 4.5 4.6  NEUTROABS 3.8 2.7  --   --   --   --   --  HGB 12.2* 12.5* 12.7* 12.1* 11.9* 11.6* 11.3*  HCT 39.4 39.7 39.1 37.7* 37.6* 36.0* 36.3*  MCV 94.5 91.1 89.3 90.2 89.5 90.0 91.2  PLT 195 177 184 188 174 183 [id number]   Basic Metabolic Panel: Recent Labs  Lab 02/24/23 1244 02/25/23 0401 02/26/23 0353 02/27/23 0620 02/28/23 0459 03/01/23 0534 03/02/23 0526  NA 140 139 138 139 140 138 139  K 4.2 4.5 4.4 4.7 4.7 4.7 4.3  CL 107 103 102 104 107 104 104  CO2 26 26 26 25 25 24 27   GLUCOSE 122* 131* 112* 124* 116* 132* 114*  BUN 51* 54* 50* 75* 89* 98* 74*  CREATININE 2.84* 3.62* 3.31* 4.46* 4.95* 5.21* 4.40*  CALCIUM 8.9 9.6 9.0 9.0 9.0 9.0 8.8*  MG  --   --  2.0 2.2 2.4 2.6*  --   PHOS 2.2* 4.3  --   --   --   --  3.5   Liver Function Tests: Recent Labs  Lab 02/24/23 1244 02/25/23 0401 03/02/23 0526  ALBUMIN 3.4* 3.3* 3.1*   CBG: Recent Labs  Lab 02/24/23 1751 02/24/23 2055 02/25/23 0931 02/25/23 1732 02/25/23 2111  GLUCAP 195* 103* 117* 150* 160*    Discharge time spent: less than 30 minutes.  Signed: Ezekiel Slocumb, DO [facility] 03/02/2023

## 2023-03-02 NOTE — TOC Progression Note (Signed)
Transition of Care [facility]) - Progression Note    Patient Details  Name: [name] MRN: [id number] Date of Birth: February 15, 1946  [facility]) CM/SW Contact  Beverly Sessions, RN Phone Number: 03/02/2023, 12:16 PM  Clinical Narrative:    Notified by MD that patient ready for discharge MD notified guardian [place]clair [place] [facility] [address], for [facility] PT to be set up, point of contact is Timmy [phone].  To coordinate discharge.  VM left    Expected Discharge Plan: [place] Barriers to Discharge: Continued Medical Work up  Expected Discharge Plan and Services In-house Referral: Clinical Social Work     Living arrangements for the past 2 months: Group Home Expected Discharge Date: 03/02/23                         HH Arranged: PT, RN [place] Agency: [place] Date [place]: 02/25/23 Time HH Agency Contacted: 1430 Representative spoke with at [place]: [place] Determinants of Health ([place]) Interventions [place]: Patient Unable To Answer (02/24/2023)  Housing: Low Risk  (02/24/2023)  Transportation Needs: Patient Unable To Answer (02/24/2023)  Utilities: Patient Unable To Answer (02/24/2023)  Tobacco Use: High Risk (02/23/2023)    Readmission Risk Interventions    10/01/2022   12:29 PM  Readmission Risk Prevention Plan  Transportation Screening Complete  PCP or Specialist Appt within 3-5 Days Complete  HRI or [employer] Complete  Social Work Consult for [place] Planning/Counseling Complete  Palliative Care Screening Not Applicable  Medication Review Press photographer) Complete

## 2023-03-02 NOTE — Progress Notes (Signed)
Physical Therapy Treatment Patient Details Name: [name] MRN: [id number] DOB: [birth year] Today's Date: 03/02/2023   History of Present Illness [name] is a 77 y.o. male with medical history significant of ESRD on HD, uncontrolled type 2 diabetes, hypertension, alcohol use disorder, dementia, schizophrenia, who presented to the ED for psychiatric evaluation after declining to participate in HD for 1 month.  History obtained through chart review due to patient's altered mental status at baseline, and lack of ability to get in touch with group home to obtain PLOF as reccomended by legal guardian.    PT Comments    Pt asleep on entry but awakens to voice, is agreeable to AMB in hall. Pt assisted for supervised AMB in hallway, well tolerated, consistent pacing, no signs of exertion. Pt does not use any DME, does not have any LOB. Gait remains slow, short, and choppy, [name] has more safety concerns though regarding lack of familiarity with environment, probably should not be able to AMB independently here, however I am confident he could safely navigate his home environment. Pt set up in chair in anticipation of breakfast arrival.    Recommendations for follow up therapy are one component of a multi-disciplinary discharge planning process, led by the attending physician.  Recommendations may be updated based on patient status, additional functional criteria and insurance authorization.  Follow Up Recommendations       Assistance Recommended at Discharge Frequent or constant Supervision/Assistance  Patient can return home with the following A little help with walking and/or transfers;Assistance with cooking/housework;Assist for transportation;A little help with bathing/dressing/bathroom;Help with stairs or ramp for entrance   Equipment Recommendations  None recommended by PT    Recommendations for Other Services       Precautions / Restrictions       Mobility  Bed  Mobility Overal bed mobility: Modified Independent                  Transfers Overall transfer level: Needs assistance   Transfers: Sit to/from Stand Sit to Stand: Supervision                Ambulation/Gait Ambulation/Gait assistance: Supervision Gait Distance (Feet): 520 Feet Assistive device: None Gait Pattern/deviations: Step-through pattern       General Gait Details: no device needed, no LOB; follows simple navigation cues   Stairs             Wheelchair Mobility    Modified Rankin (Stroke Patients Only)       Balance                                            Cognition Arousal/Alertness: Awake/alert Behavior During Therapy: WFL for tasks assessed/performed Overall Cognitive Status: History of cognitive impairments - at baseline                                          Exercises      General Comments        Pertinent Vitals/Pain Pain Assessment Pain Assessment: No/denies pain    Home Living                          Prior Function  PT Goals (current goals can now be found in the care plan section) Acute Rehab PT Goals PT Goal Formulation: Patient unable to participate in goal setting Progress towards PT goals: Progressing toward goals    Frequency    Min 2X/week      PT Plan Current plan remains appropriate    Co-evaluation              AM-PAC PT "6 Clicks" Mobility   Outcome Measure  Help needed turning from your back to your side while in a flat bed without using bedrails?: None Help needed moving from lying on your back to sitting on the side of a flat bed without using bedrails?: None Help needed moving to and from a bed to a chair (including a wheelchair)?: A Little Help needed standing up from a chair using your arms (e.g., wheelchair or bedside chair)?: A Little Help needed to walk in hospital room?: A Little Help needed climbing 3-5 steps  with a railing? : A Little 6 Click Score: 20    End of Session Equipment Utilized During Treatment: Gait belt Activity Tolerance: Patient tolerated treatment well;No increased pain Patient left: in chair;with call bell/phone within reach;with chair alarm set Nurse Communication: Mobility status PT Visit Diagnosis: Unsteadiness on feet (R26.81);Muscle weakness (generalized) (M62.81)     Time: [id number] PT Time Calculation (min) (ACUTE ONLY): 14 min  Charges:  $Therapeutic Activity: 8-22 mins                    9:38 AM, 03/02/23 Etta Grandchild, PT, DPT Physical Therapist - [facility]  [phone] ([place])    [place] C 03/02/2023, 9:35 AM

## 2023-03-03 NOTE — TOC Transition Note (Signed)
Transition of Care [facility]) - CM/SW Discharge Note   Patient Details  Name: [name] MRN: [id number] Date of Birth: 1946-03-27  Transition [facility]) CM/SW Contact:  Chapman Fitch, RN Phone Number: 03/03/2023, 8:02 AM   Clinical Narrative:     Notified by MD that patient medially ready for discharge and she has updated guardian Lianne Moris   Confirmed with Cephus Shelling at group home that patient can return today. He states that report does not have to be called, fl2 is not needed.  He request that dc summary be emailed.  TOC secure emailed DC summary to [employer] states that patient will need to come back by cab that the group home will pay for.  I spoke with guardian Lianne Moris, and she confirms she can return by cab.   Dimas Chyle dialysis liaison notified of discharge    Barriers to Discharge: Continued Medical Work up   Patient Goals and CMS Choice CMS Medicare.gov Compare Post Acute Care list provided to:: Legal Guardian    Discharge Placement                         Discharge Plan and Services Additional resources added to the After Visit Summary for   In-house Referral: Clinical Social Work                        HH Arranged: PT, RN HH Agency: [facility] Date Loc [facility] Agency Contacted: 02/25/23 Time HH Agency Contacted: 1430 Representative spoke with at [facility] Agency: Elnita Maxwell  Social Determinants of Health (SDOH) Interventions SDOH Screenings   Food Insecurity: Patient Unable To Answer (02/24/2023)  Housing: Low Risk  (02/24/2023)  Transportation Needs: Patient Unable To Answer (02/24/2023)  Utilities: Patient Unable To Answer (02/24/2023)  Tobacco Use: High Risk (02/23/2023)     Readmission Risk Interventions    10/01/2022   12:29 PM  Readmission Risk Prevention Plan  Transportation Screening Complete  PCP or Specialist Appt within 3-5 Days Complete  HRI or Home Care Consult Complete  Social Work Consult for  Recovery Care Planning/Counseling Complete  Palliative Care Screening Not Applicable  Medication Review Oceanographer) Complete

## 2023-05-10 ENCOUNTER — Telehealth (INDEPENDENT_AMBULATORY_CARE_PROVIDER_SITE_OTHER): Payer: Self-pay

## 2023-05-10 NOTE — Telephone Encounter (Signed)
A fax was received from Will at [facility] for the patient to have a permcath removal. Patient is scheduled with Dr. Wyn Quaker on 05/15/23 with a 10:45 am arrival time to the [facility]. Pre-procedure instructions will be faxed to attention Will.

## 2023-05-15 ENCOUNTER — Encounter: Admission: RE | Payer: Self-pay | Source: Home / Self Care

## 2023-05-15 ENCOUNTER — Ambulatory Visit: Admission: RE | Admit: 2023-05-15 | Payer: Medicare Other | Source: Home / Self Care | Admitting: Vascular Surgery

## 2023-05-15 DIAGNOSIS — N186 End stage renal disease: Secondary | ICD-10-CM

## 2023-05-15 SURGERY — DIALYSIS/PERMA CATHETER REMOVAL
Anesthesia: LOCAL

## 2023-05-18 ENCOUNTER — Telehealth (INDEPENDENT_AMBULATORY_CARE_PROVIDER_SITE_OTHER): Payer: Self-pay

## 2023-05-18 NOTE — Telephone Encounter (Signed)
Patient's guardian called to reschedule the patient's permcath removal that was canceled on 05/15/23 as the patient was a no show. Per the guardian the facility and dialysis center stated they did not know about the appt. I advised that the information had been sent to the center and the facility for the appt. Patient has been rescheduled to 05/22/23 with a 2:15 pm arrival time to the [facility].

## 2023-05-22 ENCOUNTER — Encounter
Admission: RE | Disposition: A | Discharge status: Skilled Nursing Facility | Payer: Self-pay | Source: Home / Self Care | Attending: Vascular Surgery

## 2023-05-22 ENCOUNTER — Encounter: Payer: Self-pay | Admitting: Vascular Surgery

## 2023-05-22 ENCOUNTER — Ambulatory Visit
Admission: RE | Admit: 2023-05-22 | Discharge: 2023-05-22 | Disposition: A | Discharge status: Skilled Nursing Facility | Payer: Medicare Other | Attending: Vascular Surgery | Admitting: Vascular Surgery

## 2023-05-22 DIAGNOSIS — T8249XA Other complication of vascular dialysis catheter, initial encounter: Secondary | ICD-10-CM | POA: Insufficient documentation

## 2023-05-22 DIAGNOSIS — F17210 Nicotine dependence, cigarettes, uncomplicated: Secondary | ICD-10-CM | POA: Diagnosis not present

## 2023-05-22 DIAGNOSIS — Y841 Kidney dialysis as the cause of abnormal reaction of the patient, or of later complication, without mention of misadventure at the time of the procedure: Secondary | ICD-10-CM | POA: Diagnosis not present

## 2023-05-22 DIAGNOSIS — Z95828 Presence of other vascular implants and grafts: Secondary | ICD-10-CM | POA: Diagnosis not present

## 2023-05-22 DIAGNOSIS — E1122 Type 2 diabetes mellitus with diabetic chronic kidney disease: Secondary | ICD-10-CM | POA: Diagnosis not present

## 2023-05-22 DIAGNOSIS — Z452 Encounter for adjustment and management of vascular access device: Secondary | ICD-10-CM | POA: Diagnosis not present

## 2023-05-22 DIAGNOSIS — I120 Hypertensive chronic kidney disease with stage 5 chronic kidney disease or end stage renal disease: Secondary | ICD-10-CM | POA: Diagnosis not present

## 2023-05-22 DIAGNOSIS — N186 End stage renal disease: Secondary | ICD-10-CM | POA: Insufficient documentation

## 2023-05-22 DIAGNOSIS — Z992 Dependence on renal dialysis: Secondary | ICD-10-CM | POA: Diagnosis not present

## 2023-05-22 HISTORY — PX: DIALYSIS/PERMA CATHETER REMOVAL: CATH118289

## 2023-05-22 SURGERY — DIALYSIS/PERMA CATHETER REMOVAL
Anesthesia: LOCAL

## 2023-05-22 MED ORDER — LIDOCAINE-EPINEPHRINE (PF) 1 %-1:200000 IJ SOLN
INTRAMUSCULAR | Status: DC | PRN
  Administered 2023-05-22: 10 mL

## 2023-05-22 SURGICAL SUPPLY — 2 items
FORCEPS HALSTEAD CVD 5IN STRL (INSTRUMENTS) IMPLANT
TRAY LACERAT/PLASTIC (MISCELLANEOUS) IMPLANT

## 2023-05-22 NOTE — H&P (Signed)
[facility] Admission History & Physical  MRN : [id number]  [name] is a 77 y.o. (04-05-1946) male who presents with chief complaint of No chief complaint on file. [name]  History of Present Illness: I am asked to evaluate the patient by the dialysis center. The patient was sent here because they have a nonfunctioning tunneled catheter and a functioning AVG.  The patient reports they're not been any problems with any of their dialysis runs. They are reporting good flows with good parameters at dialysis.   Patient denies pain or tenderness overlying the access.  There is no pain with dialysis.  The patient denies hand pain or finger pain consistent with steal syndrome.  No fevers or chills while on dialysis.    No current facility-administered medications for this encounter.    Past Medical History:  Diagnosis Date   Alcoholism (HCC)    Allergy    Altered mental status 2011   hx of 2/2 hypercalcemia, EtOH w/d and medication overuse   Angina    mild pain on arm, left chest, moves down left chest   BPH (benign prostatic hyperplasia)    Chronic kidney disease, stage 4 (severe) (HCC)    Chronic renal insufficiency    with history of acute on chronic secondary to dehydration and hypercalcemia   Dementia (HCC)    Encephalopathy    Fx humeral neck 2008   right side, no history of surgical repair   History of nephrolithiasis 2008   s/p ureterolithotomy    Hypercalcemia    2/2 primary hyperparathyroidism   Hyperkalemia    Hypertension    Pancreatitis 2011   history of several admissions for pancreatitis    Parathyroid adenoma    Primary hyperparathyroidism (HCC)    s/p parathyroid adenoma resection Oct. 8, 2011, repeat scan showed residual right adenoma, however pt did not f/u with surgery as outpt   Schizophrenia (HCC)    previously on zyprexa   Tobacco abuse    Type 2 diabetes mellitus with chronic kidney disease [facility])     Past Surgical History:   Procedure Laterality Date   A/V SHUNTOGRAM Left 01/12/2023   Procedure: A/V SHUNTOGRAM;  Surgeon: Annice Needy, MD;  Location: [facility] INVASIVE CV LAB;  Service: Cardiovascular;  Laterality: Left;   Ankle Pinning     AV FISTULA PLACEMENT Left 08/17/2022   Procedure: INSERTION OF ARTERIOVENOUS (AV) GORE-TEX GRAFT ARM (BRACHIAL AXILLARY);  Surgeon: Annice Needy, MD;  Location: [facility] ORS;  Service: Vascular;  Laterality: Left;   cysto, removal of bladder stone, attempted stent placement  08/15/2007   cystoscopy, laser cystolithoplaxy  08/16/2007   DIALYSIS/PERMA CATHETER INSERTION N/A 07/04/2022   Procedure: DIALYSIS/PERMA CATHETER INSERTION;  Surgeon: Annice Needy, MD;  Location: [facility] INVASIVE CV LAB;  Service: Cardiovascular;  Laterality: N/A;   KIDNEY STONE SURGERY  07/2007 X2   /E-chart   open left proximal ureterolithotomy  09/18/2007   parathyroid adenoma resection  09/04/2010   residual adenoma Oct. 12, 2011   PARATHYROIDECTOMY  03/09/2012   Procedure: PARATHYROIDECTOMY;  Surgeon: Valarie Merino, MD;  Location: [facility] OR;  Service: General;  Laterality: N/A;  mediastinal paraqthyroidectomy    THYROIDECTOMY  03/09/2012     Social History   Tobacco Use   Smoking status: Every Day    Packs/day: 1.00    Years: 50.00    Additional pack years: 0.00    Total pack years: 50.00    Types: Cigarettes   Smokeless  tobacco: Never  Substance Use Topics   Alcohol use: No    Comment: 03/09/12 "last alcohol 1983"; history of heavy alcohol use   Drug use: No    Types: Marijuana    Comment: 03/09/12 Last drug use "~1978"     Family History  Problem Relation Age of Onset   Kidney cancer Neg Hx    Bladder Cancer Neg Hx    Prostate cancer Neg Hx     No family history of bleeding or clotting disorders, autoimmune disease or porphyria  No Known Allergies   REVIEW OF SYSTEMS (Negative unless checked)  Constitutional: [] Weight loss  [] Fever  [] Chills Cardiac: [] Chest pain   [] Chest pressure    [] Palpitations   [] Shortness of breath when laying flat   [] Shortness of breath at rest   [x] Shortness of breath with exertion. Vascular:  [] Pain in legs with walking   [] Pain in legs at rest   [] Pain in legs when laying flat   [] Claudication   [] Pain in feet when walking  [] Pain in feet at rest  [] Pain in feet when laying flat   [] History of DVT   [] Phlebitis   [] Swelling in legs   [] Varicose veins   [] Non-healing ulcers Pulmonary:   [] Uses home oxygen   [] Productive cough   [] Hemoptysis   [] Wheeze  [] COPD   [] Asthma Neurologic:  [] Dizziness  [] Blackouts   [] Seizures   [] History of stroke   [] History of TIA  [] Aphasia   [] Temporary blindness   [] Dysphagia   [] Weakness or numbness in arms   [] Weakness or numbness in legs Musculoskeletal:  [x] Arthritis   [] Joint swelling   [x] Joint pain   [] Low back pain Hematologic:  [] Easy bruising  [] Easy bleeding   [] Hypercoagulable state   [x] Anemic  [] Hepatitis Gastrointestinal:  [] Blood in stool   [] Vomiting blood  [x] Gastroesophageal reflux/heartburn   [] Difficulty swallowing. Genitourinary:  [x] Chronic kidney disease   [] Difficult urination  [] Frequent urination  [] Burning with urination   [] Blood in urine Skin:  [] Rashes   [] Ulcers   [] Wounds Psychological:  [] History of anxiety   []  History of major depression.  Physical Examination  There were no vitals filed for this visit. There is no height or weight on file to calculate BMI. Gen: WD/WN, NAD Head: [place]/AT, No temporalis wasting.  Ear/Nose/Throat: Hearing grossly intact, nares w/o erythema or drainage, oropharynx w/o Erythema/Exudate,  Eyes: Conjunctiva clear, sclera non-icteric Neck: Trachea midline.  No JVD.  Pulmonary:  Good air movement, respirations not labored, no use of accessory muscles.  Cardiac: RRR, normal S1, S2. Vascular:  Vessel Right Left  Radial Palpable Palpable   Musculoskeletal: M/S 5/5 throughout.  Extremities without ischemic changes.  No deformity or atrophy.  Neurologic:  Sensation grossly intact in extremities.  Symmetrical.  Speech is fluent. Motor exam as listed above. Psychiatric: Judgment intact, Mood & affect appropriate for pt's clinical situation. Dermatologic: No rashes or ulcers noted.  No cellulitis or open wounds. Lymph : No Cervical, Axillary, or Inguinal lymphadenopathy.   CBC Lab Results  Component Value Date   WBC 4.6 03/01/2023   HGB 11.3 (L) 03/01/2023   HCT 36.3 (L) 03/01/2023   MCV 91.2 03/01/2023   PLT 196 03/01/2023    BMET    Component Value Date/Time   NA 139 03/02/2023 0526   NA 135 03/08/2017 1102   K 4.3 03/02/2023 0526   CL 104 03/02/2023 0526   CO2 27 03/02/2023 0526   GLUCOSE 114 (H) 03/02/2023 0526   BUN 74 (  H) 03/02/2023 0526   BUN 45 (H) 03/08/2017 1102   CREATININE 4.40 (H) 03/02/2023 0526   CALCIUM 8.8 (L) 03/02/2023 0526   CALCIUM >15.0 (HH) 11/09/2011 1455   GFRNONAA 13 (L) 03/02/2023 0526   GFRAA 21 (L) 01/20/2019 0924   CrCl cannot be calculated (Patient's most recent lab result is older than the maximum 21 days allowed.).  COAG Lab Results  Component Value Date   INR 1.15 01/04/2012   INR 1.03 11/09/2011   INR 0.91 08/15/2010    Radiology No results found.  Assessment/Plan 1.  Complication dialysis device:  Patient's Tunneled catheter is not being used. The patient has an extremity access that is functioning well. Therefore, the patient will undergo removal of the tunneled catheter under local anesthesia.  The risks and benefits were described to the patient.  All questions were answered.  The patient agrees to proceed with angiography and intervention. Potassium will be drawn to ensure that it is an appropriate level prior to performing intervention. 2.  End-stage renal disease requiring hemodialysis:  Patient will continue dialysis therapy without further interruption if a successful intervention is not achieved then a tunneled catheter will be placed. Dialysis has already been arranged. 3.   Hypertension:  Patient will continue medical management; nephrology is following no changes in oral medications.     Festus Barren, MD  05/22/2023 2:42 PM

## 2023-05-22 NOTE — Discharge Instructions (Signed)
Tunneled Catheter Removal, Care After Refer to this sheet in the next few weeks. These instructions provide you with information about caring for yourself after your procedure. Your health care provider may also give you more specific instructions. Your treatment has been planned according to current medical practices, but problems sometimes occur. Call your health care provider if you have any problems or questions after your procedure. What can I expect after the procedure? After the procedure, it is common to have: Some mild redness, swelling, and pain around your catheter site.   Follow these instructions at home: Incision care  Check your removal site  every day for signs of infection. Check for: More redness, swelling, or pain. More fluid or blood. Warmth. Pus or a bad smell. Remove your dressing in 48hrs leave open to air  Activity  Return to your normal activities as told by your health care provider. Ask your health care provider what activities are safe for you. Do not lift anything that is heavier than 10 lb (4.5 kg) for 3 days  You may shower tomorrow  Contact a health care provider if: You have more fluid or blood coming from your removal site You have more redness, swelling, or pain at your incisions or around the area where your catheter was removed Your removal site feel warm to the touch. You feel unusually weak. You feel nauseous.. Get help right away if You have swelling in your arm, shoulder, neck, or face. You develop chest pain. You have difficulty breathing. You feel dizzy or light-headed. You have pus or a bad smell coming from your removal site You have a fever. You develop bleeding from your removal site, and your bleeding does not stop. This information is not intended to replace advice given to you by your health care provider. Make sure you discuss any questions you have with your health care provider. Document Released: 10/31/2012 Document Revised:  07/17/2016 Document Reviewed: 08/10/2015 Elsevier Interactive Patient Education  2017 [employer] 

## 2023-05-22 NOTE — Op Note (Signed)
Operative Note     Preoperative diagnosis:   1. ESRD with functional permanent access  Postoperative diagnosis:  1. ESRD with functional permanent access  Procedure:  Removal of right jugular Permcath  Surgeon:  Festus Barren, MD  Anesthesia:  Local  EBL:  Minimal  Indication for the Procedure:  The patient has a functional permanent dialysis access and no longer needs their permcath.  This can be removed.  Risks and benefits are discussed and informed consent is obtained.  Description of the Procedure:  The patient's right neck, chest and existing catheter were sterilely prepped and draped. The area around the catheter was anesthetized copiously with 1% lidocaine. The catheter was dissected out with curved hemostats until the cuff was freed from the surrounding fibrous sheath. The fiber sheath was transected, and the catheter was then removed in its entirety using gentle traction. Pressure was held and sterile dressings were placed. The patient tolerated the procedure well and was taken to the recovery room in stable condition.     Festus Barren  05/22/2023, 3:27 PM This note was created with Dragon Medical transcription system. Any errors in dictation are purely unintentional.

## 2023-05-23 ENCOUNTER — Encounter: Payer: Self-pay | Admitting: Vascular Surgery
